# Patient Record
Sex: Female | Born: 1950
Health system: Southern US, Community
[De-identification: ages and names within clinical notes are randomized; demographics above are authoritative.]

## PROBLEM LIST (undated history)

## (undated) DIAGNOSIS — M1990 Unspecified osteoarthritis, unspecified site: Secondary | ICD-10-CM

## (undated) DIAGNOSIS — E538 Deficiency of other specified B group vitamins: Secondary | ICD-10-CM

## (undated) DIAGNOSIS — Z8719 Personal history of other diseases of the digestive system: Secondary | ICD-10-CM

## (undated) DIAGNOSIS — K5090 Crohn's disease, unspecified, without complications: Secondary | ICD-10-CM

## (undated) DIAGNOSIS — M810 Age-related osteoporosis without current pathological fracture: Principal | ICD-10-CM

## (undated) DIAGNOSIS — D509 Iron deficiency anemia, unspecified: Secondary | ICD-10-CM

## (undated) DIAGNOSIS — K2970 Gastritis, unspecified, without bleeding: Secondary | ICD-10-CM

## (undated) DIAGNOSIS — B9681 Helicobacter pylori [H. pylori] as the cause of diseases classified elsewhere: Secondary | ICD-10-CM

## (undated) DIAGNOSIS — K219 Gastro-esophageal reflux disease without esophagitis: Secondary | ICD-10-CM

## (undated) DIAGNOSIS — E669 Obesity, unspecified: Secondary | ICD-10-CM

## (undated) DIAGNOSIS — R51 Headache: Secondary | ICD-10-CM

## (undated) DIAGNOSIS — M722 Plantar fascial fibromatosis: Secondary | ICD-10-CM

## (undated) DIAGNOSIS — M797 Fibromyalgia: Secondary | ICD-10-CM

## (undated) DIAGNOSIS — L509 Urticaria, unspecified: Secondary | ICD-10-CM

## (undated) DIAGNOSIS — R519 Headache, unspecified: Secondary | ICD-10-CM

## (undated) HISTORY — DX: Fibromyalgia: M79.7

## (undated) HISTORY — PX: UPPER GASTROINTESTINAL ENDOSCOPY: SHX188

## (undated) HISTORY — PX: ABDOMINAL HYSTERECTOMY: SHX81

## (undated) HISTORY — DX: Crohn's disease, unspecified, without complications: K50.90

## (undated) HISTORY — DX: Deficiency of other specified B group vitamins: E53.8

## (undated) HISTORY — DX: Iron deficiency anemia, unspecified: D50.9

## (undated) HISTORY — PX: TOTAL ABDOMINAL HYSTERECTOMY W/ BILATERAL SALPINGOOPHORECTOMY: SHX83

## (undated) HISTORY — DX: Obesity, unspecified: E66.9

## (undated) HISTORY — PX: OVARIAN CYST REMOVAL: SHX89

## (undated) HISTORY — DX: Gastritis, unspecified, without bleeding: K29.70

## (undated) HISTORY — DX: Age-related osteoporosis without current pathological fracture: M81.0

## (undated) HISTORY — DX: Headache: R51

## (undated) HISTORY — DX: Helicobacter pylori (H. pylori) as the cause of diseases classified elsewhere: B96.81

## (undated) HISTORY — PX: COLONOSCOPY: SHX174

## (undated) HISTORY — DX: Headache, unspecified: R51.9

## (undated) HISTORY — DX: Plantar fascial fibromatosis: M72.2

## (undated) HISTORY — DX: Urticaria, unspecified: L50.9

---

## 2001-01-19 ENCOUNTER — Other Ambulatory Visit: Admission: RE | Admit: 2001-01-19 | Discharge: 2001-01-19 | Payer: Self-pay | Admitting: *Deleted

## 2001-02-03 ENCOUNTER — Emergency Department (HOSPITAL_COMMUNITY): Admission: EM | Admit: 2001-02-03 | Discharge: 2001-02-03 | Payer: Self-pay | Admitting: *Deleted

## 2002-02-08 ENCOUNTER — Ambulatory Visit (HOSPITAL_COMMUNITY): Admission: RE | Admit: 2002-02-08 | Discharge: 2002-02-08 | Payer: Self-pay | Admitting: Urology

## 2002-02-08 ENCOUNTER — Encounter: Payer: Self-pay | Admitting: Urology

## 2002-02-22 ENCOUNTER — Other Ambulatory Visit: Admission: RE | Admit: 2002-02-22 | Discharge: 2002-02-22 | Payer: Self-pay | Admitting: *Deleted

## 2002-03-12 ENCOUNTER — Inpatient Hospital Stay (HOSPITAL_COMMUNITY): Admission: RE | Admit: 2002-03-12 | Discharge: 2002-03-15 | Payer: Self-pay | Admitting: *Deleted

## 2002-07-14 ENCOUNTER — Encounter: Payer: Self-pay | Admitting: Family Medicine

## 2002-07-14 ENCOUNTER — Ambulatory Visit (HOSPITAL_COMMUNITY): Admission: RE | Admit: 2002-07-14 | Discharge: 2002-07-14 | Payer: Self-pay | Admitting: Family Medicine

## 2003-03-23 ENCOUNTER — Encounter: Payer: Self-pay | Admitting: Unknown Physician Specialty

## 2003-03-23 ENCOUNTER — Ambulatory Visit (HOSPITAL_COMMUNITY): Admission: RE | Admit: 2003-03-23 | Discharge: 2003-03-23 | Payer: Self-pay | Admitting: Unknown Physician Specialty

## 2005-03-27 ENCOUNTER — Ambulatory Visit (HOSPITAL_COMMUNITY): Admission: RE | Admit: 2005-03-27 | Discharge: 2005-03-27 | Payer: Self-pay | Admitting: Family Medicine

## 2005-08-24 ENCOUNTER — Inpatient Hospital Stay (HOSPITAL_COMMUNITY): Admission: EM | Admit: 2005-08-24 | Discharge: 2005-08-24 | Payer: Self-pay | Admitting: Emergency Medicine

## 2006-11-17 ENCOUNTER — Emergency Department (HOSPITAL_COMMUNITY): Admission: EM | Admit: 2006-11-17 | Discharge: 2006-11-17 | Payer: Self-pay | Admitting: Emergency Medicine

## 2007-01-08 ENCOUNTER — Ambulatory Visit (HOSPITAL_COMMUNITY): Admission: RE | Admit: 2007-01-08 | Discharge: 2007-01-08 | Payer: Self-pay | Admitting: Family Medicine

## 2007-07-30 DIAGNOSIS — D509 Iron deficiency anemia, unspecified: Secondary | ICD-10-CM

## 2007-07-30 HISTORY — DX: Iron deficiency anemia, unspecified: D50.9

## 2007-10-01 ENCOUNTER — Ambulatory Visit: Payer: Self-pay | Admitting: Gastroenterology

## 2007-10-14 ENCOUNTER — Encounter: Payer: Self-pay | Admitting: Gastroenterology

## 2007-10-14 ENCOUNTER — Ambulatory Visit: Payer: Self-pay | Admitting: Gastroenterology

## 2007-10-14 ENCOUNTER — Ambulatory Visit (HOSPITAL_COMMUNITY): Admission: RE | Admit: 2007-10-14 | Discharge: 2007-10-14 | Payer: Self-pay | Admitting: Gastroenterology

## 2007-10-14 HISTORY — PX: ESOPHAGOGASTRODUODENOSCOPY: SHX1529

## 2007-10-14 HISTORY — PX: OTHER SURGICAL HISTORY: SHX169

## 2009-10-11 ENCOUNTER — Encounter (HOSPITAL_COMMUNITY): Admission: RE | Admit: 2009-10-11 | Discharge: 2009-11-10 | Payer: Self-pay | Admitting: Oncology

## 2009-10-11 ENCOUNTER — Ambulatory Visit (HOSPITAL_COMMUNITY): Payer: Self-pay | Admitting: Oncology

## 2009-11-28 ENCOUNTER — Encounter (HOSPITAL_COMMUNITY): Admission: RE | Admit: 2009-11-28 | Discharge: 2009-12-28 | Payer: Self-pay | Admitting: Oncology

## 2009-11-28 ENCOUNTER — Ambulatory Visit (HOSPITAL_COMMUNITY): Payer: Self-pay | Admitting: Oncology

## 2010-01-23 ENCOUNTER — Ambulatory Visit (HOSPITAL_COMMUNITY): Payer: Self-pay | Admitting: Oncology

## 2010-01-23 ENCOUNTER — Encounter (HOSPITAL_COMMUNITY): Admission: RE | Admit: 2010-01-23 | Discharge: 2010-02-22 | Payer: Self-pay | Admitting: Oncology

## 2010-01-24 ENCOUNTER — Telehealth (INDEPENDENT_AMBULATORY_CARE_PROVIDER_SITE_OTHER): Payer: Self-pay

## 2010-02-07 ENCOUNTER — Encounter (INDEPENDENT_AMBULATORY_CARE_PROVIDER_SITE_OTHER): Payer: Self-pay

## 2010-02-16 ENCOUNTER — Encounter: Payer: Self-pay | Admitting: Gastroenterology

## 2010-02-21 ENCOUNTER — Ambulatory Visit: Payer: Self-pay | Admitting: Gastroenterology

## 2010-02-21 ENCOUNTER — Ambulatory Visit (HOSPITAL_COMMUNITY)
Admission: RE | Admit: 2010-02-21 | Discharge: 2010-02-21 | Payer: Self-pay | Source: Home / Self Care | Admitting: Gastroenterology

## 2010-02-23 ENCOUNTER — Telehealth: Payer: Self-pay | Admitting: Gastroenterology

## 2010-03-06 ENCOUNTER — Encounter (HOSPITAL_COMMUNITY): Admission: RE | Admit: 2010-03-06 | Discharge: 2010-04-05 | Payer: Self-pay | Admitting: Oncology

## 2010-04-10 ENCOUNTER — Ambulatory Visit: Payer: Self-pay | Admitting: Gastroenterology

## 2010-04-10 DIAGNOSIS — A048 Other specified bacterial intestinal infections: Secondary | ICD-10-CM | POA: Insufficient documentation

## 2010-04-10 DIAGNOSIS — D509 Iron deficiency anemia, unspecified: Secondary | ICD-10-CM | POA: Insufficient documentation

## 2010-05-01 ENCOUNTER — Encounter (INDEPENDENT_AMBULATORY_CARE_PROVIDER_SITE_OTHER): Payer: Self-pay | Admitting: *Deleted

## 2010-05-08 ENCOUNTER — Ambulatory Visit (HOSPITAL_COMMUNITY): Payer: Self-pay | Admitting: Oncology

## 2010-05-08 ENCOUNTER — Encounter (HOSPITAL_COMMUNITY)
Admission: RE | Admit: 2010-05-08 | Discharge: 2010-06-07 | Payer: Self-pay | Source: Home / Self Care | Admitting: Oncology

## 2010-07-03 ENCOUNTER — Encounter (HOSPITAL_COMMUNITY)
Admission: RE | Admit: 2010-07-03 | Discharge: 2010-08-02 | Payer: Self-pay | Source: Home / Self Care | Attending: Oncology | Admitting: Oncology

## 2010-07-03 ENCOUNTER — Ambulatory Visit (HOSPITAL_COMMUNITY): Payer: Self-pay | Admitting: Oncology

## 2010-08-19 ENCOUNTER — Encounter: Payer: Self-pay | Admitting: Family Medicine

## 2010-08-20 ENCOUNTER — Encounter: Payer: Self-pay | Admitting: Gastroenterology

## 2010-08-28 ENCOUNTER — Encounter (HOSPITAL_COMMUNITY)
Admission: RE | Admit: 2010-08-28 | Discharge: 2010-08-28 | Payer: Self-pay | Source: Home / Self Care | Attending: Oncology | Admitting: Oncology

## 2010-08-28 ENCOUNTER — Ambulatory Visit (HOSPITAL_COMMUNITY)
Admission: RE | Admit: 2010-08-28 | Discharge: 2010-08-28 | Payer: Self-pay | Source: Home / Self Care | Attending: Oncology | Admitting: Oncology

## 2010-08-28 LAB — CBC
HCT: 35.2 % — ABNORMAL LOW (ref 36.0–46.0)
MCH: 28.4 pg (ref 26.0–34.0)
MCV: 82.6 fL (ref 78.0–100.0)
Platelets: 252 10*3/uL (ref 150–400)
RBC: 4.26 MIL/uL (ref 3.87–5.11)
WBC: 10 10*3/uL (ref 4.0–10.5)

## 2010-08-28 LAB — FERRITIN: Ferritin: 32 ng/mL (ref 10–291)

## 2010-08-28 NOTE — Letter (Signed)
Summary: Plan of Care, Need to [facility]  [address]   [place], [place] [address]   Phone: [phone]  Fax: [phone]    February 07, 2010  [name] [address] [place], [place]  [address] [birth year]   Dear [name],   We are writing this letter to inform you of treatment plans and/or discuss your plan of care.  We have left messages on the machine several times. Please call so we can get you scheduled for the EGD that we had discussed.  Please do not neglect your health.   Sincerely,    Waldon Merl LPN  [facility] Ph: [phone]    Fax: [phone]

## 2010-08-28 NOTE — Letter (Signed)
Summary: Plan of Care, Need to [facility]  [address]   [place], [place] [address]   Phone: [phone]  Fax: [phone]    February 07, 2010  [name] [address] [place], [place]  [address] April 08, 1951   Dear [name],   We are writing this letter to inform you of treatment plans and/or discuss your plan of care.  We have tried several times to contact you; however, we have yet to reach you.  We ask that you please contact our office for follow-up on your gastrointestinal issues.  We can  be reached at [phone] to schedule an appointment, or to speak with someone regarding your health care needs.  Please do not neglect your health.   Sincerely,    Waldon Merl LPN  [facility] Ph: [phone]    Fax: [phone]

## 2010-08-28 NOTE — Assessment & Plan Note (Signed)
Summary: [name] GASTRITIS   Visit Type:  Follow-up Visit Primary Care Dorothee Napierkowski:  [name], M.D.  Chief Complaint:  F/U H. Pylori gastritis/SB ulcer.  History of Present Illness: Rare pain in belly.  Had a little with ABO and took all the pills. BMs: nl, 2-3 times a day. No blood in stool or melena. Appetite: good. Back pain from DDD. No joint pain. No ASA, BC, Goody's, Ibuprofen, or Aleve. Quit NSAIDS several years ago.  Preventive Screening-Counseling & Management  Alcohol-Tobacco     Smoking Status: never  Current Medications (verified): 1)  Prilosec 20 Mg Cpdr (Omeprazole) .... Take 1 Tablet By Mouth Once A Day 2)  Pentasa 500 Mg Cr-Caps (Mesalamine) .... 2 By Mouth Qid  Allergies (verified): 1)  ! Sulfa  Past History:  Past Medical History: Iron deficiency anemia 2009 on IBUPROFEN-ulcers in the terminal ileum/Cameron's ulcers **iTCS/EGD-2009-LARGE  HIATAL HERNIA H. pylori gastritis/EGD JULY 2011 **AFN x7 DAYS 2009, and ABO BID x10 DAYS JULY 2011 JULY 2011-CAPSULE ENDOSCOPY: SB ULCERS, Pentasa 2 qid GERD  **EGD/Bx '09, '11 Fibromyalgia Arthritis DDD  Past Surgical History: Hysterectomy  Family History: No FH of Colon Cancer OR POLYPS  Social History: NO cigs Alcohol Use - no Married x11 years Smoking Status:  never  Review of Systems       2009: 204 lbs  Vital Signs:  Patient profile:   60 year old female Height:      64 inches Weight:      203 pounds BMI:     34.97 Temp:     98.1 degrees F oral Pulse rate:   84 / minute BP sitting:   120 / 76  (left arm) Cuff size:   large  Vitals Entered By: Waldon Merl LPN (April 10, 2632 2:41 PM)  Physical Exam  General:  Well developed, well nourished, no acute distress. Head:  Normocephalic and atraumatic. Eyes:  PERRL, no icterus. Mouth:  No deformity or lesions. Neck:  Supple; no masses. Lungs:  Clear throughout to auscultation. Heart:  Regular rate and rhythm; no  murmurs. Abdomen:  Soft, nontender and nondistended. Normal bowel sounds. obese.   Extremities:  No edema noted. Neurologic:  Alert and  oriented x4;  grossly normal neurologically.  Impression & Recommendations:  Problem # 1:  ANEMIA, IRON DEFICIENCY (ICD-280.9) Pt likely has SB Crohn's Disease, website-www.ccfa.org. Obtain PROMETHEUS IBD PANEL. Pt will call insurance to see if you have a large co-pay for this lab. Continue Pentasa. Have labs faxed to me in OCT 2011. If blood or iron low, then we will need to repeat the colonoscopy. Otherwise, follow up in 6 mos.   CC: PCP AND DR. Tressie Stalker  Problem # 2:  HELICOBACTER PYLORI GASTRITIS (ICD-041.86) Assessment: Improved Took ABO. If FeDA perists,consider H. pylori stool Ag.  Patient Instructions: 1)  You likely have Crohn's Disease, website-www.ccfa.org. 2)  GET PROMETHEUS IBD PANEL DRAWN. 3)  Call insurance to see if you have a large co-pay for this lab. 4)  Continue Pentasa. 5)  Have labs faxed to me in OCT 2011. 6)  If blood or iron low, then we will need to repeat the colonoscopy. 7)  Otherwise, follow up in 6 mos.  8)  The medication list was reviewed and reconciled.  All changed / newly prescribed medications were explained.  A complete medication list was provided to the patient / caregiver.  Appended Document: FEDA, H PYLORI GASTRITIS 6 MONTH F/U OPV IS IN THE COMPUTER  Appended Document:  Orders Update    Clinical Lists Changes  Orders: Added new Service order of Est. Patient Level IV (59539) - Signed

## 2010-08-28 NOTE — Letter (Signed)
Summary: Internal Other /EGD Order  Internal Other /EGD Order   Imported By: Cloria Spring LPN 69/62/9528 41:32:44  _____________________________________________________________________  External Attachment:    Type:   Image     Comment:   External Document

## 2010-08-28 NOTE — Progress Notes (Signed)
Summary: H. pylori gastritis and TI ULCERS-START ABO AND PENTASA       New/Updated Medications: AMOXICILLIN 500 MG CAPS (AMOXICILLIN) 1 gm two times a day for 10 days BIAXIN 500 MG TABS (CLARITHROMYCIN) 1 by mouth two times a day for 10 days PRILOSEC 20 MG CPDR (OMEPRAZOLE) 1 by mouth two times a day for 10 days then 1 by mouth every morning PENTASA 500 MG CR-CAPS (MESALAMINE) 2 by mouth qid Prescriptions: PENTASA 500 MG CR-CAPS (MESALAMINE) 2 by mouth qid  #240 x 5   Entered and Authorized by:   West Bali MD   Signed by:   West Bali MD on 02/23/2010   Method used:   Electronically to        [facility] [phone]* (retail)       [address]       [place], [place]  [address]       Ph: [phone] or [phone]       Fax: [phone]   RxID:   [id number] PRILOSEC 20 MG CPDR (OMEPRAZOLE) 1 by mouth two times a day for 10 days then 1 by mouth every morning  #40 x 5   Entered and Authorized by:   West Bali MD   Signed by:   West Bali MD on 02/23/2010   Method used:   Electronically to        [facility] [phone]* (retail)       [address]       [place], [place]  [address]       Ph: [phone] or [phone]       Fax: [phone]   RxID:   [id number] BIAXIN 500 MG TABS (CLARITHROMYCIN) 1 by mouth two times a day for 10 days  #20 x 0   Entered and Authorized by:   West Bali MD   Signed by:   West Bali MD on 02/23/2010   Method used:   Electronically to        [facility] [phone]* (retail)       [address]       [place], [place]  [address]       Ph: [phone] or [phone]       Fax: [phone]   RxID:   [id number] AMOXICILLIN 500 MG CAPS (AMOXICILLIN) 1 gm two times a day for 10 days  #40 x 0   Entered and Authorized by:   West Bali MD   Signed by:   West Bali MD on 02/23/2010   Method used:   Electronically to        [facility] [phone]* (retail)       [address]       [place], [place]  [address]       Ph: [phone] or [phone]       Fax: [phone]   RxID:   [id number]  Spoke with pt. Discussed Bx and capsule results. Pt needs treatment for H. pylori gastritis.Needs ABO BIDx10 days and continue OMP for 3 mos. Reviewed meds: Zanatc and Hydroxyzine. Pt asked to Hold ranitidine while on OMP and Biaxin. Capsule shows multiple ulcers in the TI. Management options: 1. ileoTCS now and biopsy or 2. Mesalamine now and if no improvement  in Ferritin/Hb in 3 mos, ileoTCS/Bx. Pt elected to have option #2. Medication side effects discussed. OPV in 2 mos. West Bali MD  February 23, 2010 10:28 AM   Spoke with pt. Would like to pick up written instructions: 1. Take Antibiotics and Omeprazole twice a day for 10 days. 2. DO NOT TAKE RANITINDE WITH THE ANTIBIOTICS AND OMEPRAZOLE. 3. Medication side effects include nausea, vomiting, diarrhea, abd crampas, and a metallic taste. 4. Take Pentasa four times a day. It may cause a red rash. 5. Follow up with Dr. Darrick Penna in 2 mos. West Bali MD  February 23, 2010 10:33 AM   Appended Document: H. pylori gastritis and TI ULCERS-START ABO AND PENTASA Left message with Dr. Mariel Sleet RE: Bx and CE results.  Appended Document: H. pylori gastritis and TI ULCERS-START ABO AND PENTASA printed and left at front desk for pt  Appended Document: H. pylori gastritis and TI ULCERS-START ABO AND PENTASA pt aware of appt in Sept

## 2010-08-28 NOTE — Progress Notes (Signed)
Summary: phone note from Dr. Thornton Papas office  Phone Note From Other Clinic   Summary of Call: T/C from [facility] @ Dr. Thornton Papas office in reference to pt. They are giving her the 2nd round of iron in 3 months.  Dr. Mariel Sleet would like for pt to follow-up with Dr. Darrick Penna, in reference to scheduling a Capsule study. Please advise if she needs OV or schedule the capsule study.  Initial call taken by: Cloria Spring LPN,  January 24, 2010 3:15 PM     Appended Document: phone note from Dr. Thornton Papas office Pt needs EGD prior to a capsule study being performed. She had Cameron's ulcer on last EGD. If no Cameron's ulcer on EGD then pt will need capsule endoscopy. Please let them know and schedule pt for EHD after triaging her.  Appended Document: phone note from Dr. Thornton Papas office Called pt to triage for EGD, she said she had not been told anything by Dr. Mariel Sleet or his office. She was kind of stunned, said it is OK, but she is getting ready for work and will have to call back. I called and left message for [facility] @ Dr. Thornton Papas for a return call.  Appended Document: phone note from Dr. Thornton Papas office Spoke with [facility] @ Dr. Arnell Asal, just informed i was in process of scheduling pt for EGD per Dr. Darrick Penna.  Appended Document: phone note from Dr. Thornton Papas office [facility] to call.  Appended Document: phone note from Dr. Thornton Papas office [facility] to call.  Appended Document: phone note from Dr. Thornton Papas office [facility] to call.   Appended Document: phone note from Dr. Thornton Papas office Mailed letter to call.

## 2010-08-28 NOTE — Letter (Signed)
Summary: Recall Office Visit  [facility]  [address]   [place], [place] [address]   Phone: [phone]  Fax: [phone]      May 01, 2010   [name] [address] [place], [place]  [address] 09/15/1950   Dear Ms. Geurts,   According to our records, it is time for you to schedule a follow-up office visit with [place].   At your convenience, please call [phone] to schedule an office visit. If you have any questions, concerns, or feel that this letter is in error, we would appreciate your call.   Sincerely,    Diana Eves  [facility] Ph: [phone]   Fax: [phone]  Appended Document: Recall Office Visit AUG 2011: HB 12.3 FERRITIN 148  OCT 2011: HB 12.9

## 2010-08-30 NOTE — Letter (Signed)
Summary: REQUEST FOR MEDICAL RECORDS  REQUEST FOR MEDICAL RECORDS   Imported By: Rexene Alberts 08/20/2010 14:55:59  _____________________________________________________________________  External Attachment:    Type:   Image     Comment:   External Document

## 2010-09-04 ENCOUNTER — Ambulatory Visit (HOSPITAL_COMMUNITY): Payer: Self-pay | Admitting: Oncology

## 2010-09-12 ENCOUNTER — Ambulatory Visit (HOSPITAL_COMMUNITY): Payer: BC Managed Care – PPO | Admitting: Oncology

## 2010-09-12 DIAGNOSIS — D509 Iron deficiency anemia, unspecified: Secondary | ICD-10-CM

## 2010-09-14 ENCOUNTER — Encounter (INDEPENDENT_AMBULATORY_CARE_PROVIDER_SITE_OTHER): Payer: Self-pay | Admitting: *Deleted

## 2010-09-18 ENCOUNTER — Ambulatory Visit (HOSPITAL_COMMUNITY): Payer: BC Managed Care – PPO

## 2010-09-18 DIAGNOSIS — D509 Iron deficiency anemia, unspecified: Secondary | ICD-10-CM

## 2010-09-19 NOTE — Letter (Signed)
Summary: Recall Office Visit  [facility]  [address]   [place], [place] [address]   Phone: [phone]  Fax: [phone]      September 14, 2010   [name] [address] [place], [place]  [address] 26-Oct-1950   Dear Ms. Fauth,   According to our records, it is time for you to schedule a follow-up office visit with [place].   At your convenience, please call [phone] to schedule an office visit. If you have any questions, concerns, or feel that this letter is in error, we would appreciate your call.   Sincerely,    Diana Eves  [facility] Ph: [phone]   Fax: [phone]

## 2010-10-08 LAB — FERRITIN: Ferritin: 76 ng/mL (ref 10–291)

## 2010-10-08 LAB — CBC
HCT: 37 % (ref 36.0–46.0)
MCV: 83.5 fL (ref 78.0–100.0)
RDW: 13.3 % (ref 11.5–15.5)

## 2010-10-10 LAB — CBC
HCT: 37.4 % (ref 36.0–46.0)
Hemoglobin: 12.9 g/dL (ref 12.0–15.0)
MCHC: 34.4 g/dL (ref 30.0–36.0)
MCV: 84.1 fL (ref 78.0–100.0)
RDW: 14.6 % (ref 11.5–15.5)
WBC: 8.5 10*3/uL (ref 4.0–10.5)

## 2010-10-10 LAB — DIFFERENTIAL
Basophils Relative: 1 % (ref 0–1)
Eosinophils Absolute: 0.1 10*3/uL (ref 0.0–0.7)
Lymphs Abs: 2.3 10*3/uL (ref 0.7–4.0)
Monocytes Absolute: 0.6 10*3/uL (ref 0.1–1.0)
Monocytes Relative: 7 % (ref 3–12)

## 2010-10-12 LAB — FERRITIN: Ferritin: 148 ng/mL (ref 10–291)

## 2010-10-12 LAB — CBC
Hemoglobin: 12.3 g/dL (ref 12.0–15.0)
MCHC: 34 g/dL (ref 30.0–36.0)

## 2010-10-14 LAB — CBC
HCT: 33.2 % — ABNORMAL LOW (ref 36.0–46.0)
Hemoglobin: 11 g/dL — ABNORMAL LOW (ref 12.0–15.0)
MCHC: 33.1 g/dL (ref 30.0–36.0)
MCV: 77.1 fL — ABNORMAL LOW (ref 78.0–100.0)
RBC: 4.3 MIL/uL (ref 3.87–5.11)
RDW: 15.9 % — ABNORMAL HIGH (ref 11.5–15.5)

## 2010-10-14 LAB — FERRITIN: Ferritin: 14 ng/mL (ref 10–291)

## 2010-10-16 LAB — CBC
MCHC: 34.2 g/dL (ref 30.0–36.0)
MCV: 73.9 fL — ABNORMAL LOW (ref 78.0–100.0)
Platelets: 254 10*3/uL (ref 150–400)

## 2010-10-16 LAB — FERRITIN: Ferritin: 25 ng/mL (ref 10–291)

## 2010-10-19 LAB — CBC
HCT: 23 % — ABNORMAL LOW (ref 36.0–46.0)
Hemoglobin: 7.3 g/dL — ABNORMAL LOW (ref 12.0–15.0)
MCV: 62 fL — ABNORMAL LOW (ref 78.0–100.0)
RBC: 3.71 MIL/uL — ABNORMAL LOW (ref 3.87–5.11)
RDW: 17.5 % — ABNORMAL HIGH (ref 11.5–15.5)
WBC: 7.6 10*3/uL (ref 4.0–10.5)

## 2010-11-13 ENCOUNTER — Encounter (HOSPITAL_COMMUNITY): Payer: BC Managed Care – PPO | Attending: Oncology

## 2010-11-13 DIAGNOSIS — D509 Iron deficiency anemia, unspecified: Secondary | ICD-10-CM

## 2010-12-11 NOTE — Consult Note (Signed)
[name], [name]                  ACCOUNT NO.:  [id number]   MEDICAL RECORD NO.:  [id number]          PATIENT TYPE:  AMB   LOCATION:  DAY                           FACILITY:  [facility]   PHYSICIAN:  Caro Hight, M.D.      DATE OF BIRTH:  August 17, 1950   DATE OF CONSULTATION:  10/01/2007  DATE OF DISCHARGE:                                 CONSULTATION   REFERRING PHYSICIAN:  Margaretmary Eddy, M.D.   REASON FOR CONSULTATION:  Iron-deficiency anemia.   HISTORY OF PRESENT ILLNESS:  [name] is a 60 year old female.  She has  a history of chronic iron-deficiency anemia.  She has been told several  times since 2004 that she is anemic.  She underwent a hysterectomy in  2004, given severe anemia.  She was on iron at that time.  She tells me  she has episodes where she has pica and craves ice.  She has felt very  fatigued.  She then presented to Dr. Lance Sell office and was found to  have a hemoglobin of 9.5.  She had a ferritin checked, and it was 2.  She had a normal folate, a normal B12, and a normal TSH.  She has been  on oral iron for about to weeks now.  She has noted some scant  hematochezia which was a light pink-red blood that she attributes to  hemorrhoids with wiping.  She denies any abdominal pain.  Denies any  nausea or vomiting.  She rarely has heartburn or indigestion.  She  denies any diarrhea or constipation.  Denies any dysphagia, odynophagia,  anorexia, or early satiety.  Her weight has remained stable.  She has  never had a colonoscopy.  She does take a rare Advil.   PAST MEDICAL AND SURGICAL HISTORY:  1. Arthritis.  2. Fibromyalgia.  3. Degenerative disc disease.  4. Complete hysterectomy.   CURRENT MEDICATIONS:  1. Muscle relaxant, does not know the name, p.r.n.  2. Iron 65 mg daily.  3. Advil p.r.n.   ALLERGIES:  NO KNOWN DRUG ALLERGIES.   FAMILY HISTORY:  There is no known family history of colorectal  carcinoma or liver or chronic GI problems.   SOCIAL HISTORY:   [name] is married.  She has three grown healthy  children.  She is employed with [employer].  She has a remote history  of tobacco use.  Denies any alcohol or drug use.   REVIEW OF SYSTEMS:  See HPI, otherwise negative.   PHYSICAL EXAMINATION:  VITAL SIGNS:  Weight 202 pounds, height 63  inches, temperature 97.8, blood pressure 100/70, pulse 80.  GENERAL:  [name] is an obese Caucasian female who is alert, oriented,  pleasant, and cooperative, no acute distress.  HEENT:  Sclerae clear.  Nonicteric.  Conjunctivae pink.  Oropharynx pink and moist, without any  lesions.  NECK:  Supple, without any masses or thyromegaly.  CHEST:  Heart:  Regular rate and rhythm.  Normal S1, S2, without any  murmurs, clicks, rubs, or gallops.  LUNGS:  Clear to auscultation bilaterally.  ABDOMEN:  Protuberant  __________.  SKIN:  Pink, warm, and dry, without any rash or jaundice.   LABORATORY STUDIES:  See HPI.  She had a BMET on September 16, 2007 which  was normal.   IMPRESSION:  [name] is a 60 year old female with history of chronic  iron-deficiency anemia, ferritin of 2.  The last hemoglobin through Dr.  Lance Sell office was noted to be 9.5.  She has had scant hematochezia  with wiping on the toilet tissue.  Otherwise denies any GI complaints  other than rare heartburn and indigestion.  She has had significant  fatigue which led her to her primary care physician.  She is going to  need further evaluation to determine etiology of her chronic iron  deficiency anemia, including ruling out colorectal carcinoma or occult  malignancy.  Actually, we need to rule out Helicobacter pylori gastritis  as well as small-bowel AVMs if nothing is found on upper and lower  exams.   PLAN:  1. Colonoscopy +/- EGD with Dr. Stann Mainland in the near future.  I have      discussed both procedures, including risks and benefits, which      include but are not limited to bleeding, infection, perforation, or      drug  reaction.  She agrees with the plan Consent will be obtained.  2. She is told iron for 7 days prior to the procedure.  3. Further recommendations pending procedures.   Thank you, Dr. Wolfgang Phoenix, for allowing [place] to participate in the care of Ms.  Hart.      Vickey Huger, N.P.      Caro Hight, M.D.  Electronically Signed    KJ/MEDQ  D:  10/01/2007  T:  10/02/2007  Job:  [id number]   cc:   Margaretmary Eddy, M.D.  Fax: [phone]

## 2010-12-11 NOTE — Op Note (Signed)
[name], [name]                  ACCOUNT NO.:  [id number]   MEDICAL RECORD NO.:  [id number]          PATIENT TYPE:  AMB   LOCATION:  DAY                           FACILITY:  [facility]   PHYSICIAN:  Caro Hight, M.D.      DATE OF BIRTH:  06-Mar-1951   DATE OF PROCEDURE:  10/14/2007  DATE OF DISCHARGE:                               OPERATIVE REPORT   PROCEDURE:  1. Ileocolonoscopy with cold forceps biopsy of terminal ileum ulcers.  2. Esophagogastroduodenoscopy with cold forceps biopsy of Cameron's      ulcers.   INDICATION FOR EXAM:  [name] is a 60 year old female who presented  with iron deficiency anemia as an outpatient.  She denied any overt  signs of active bleeding except occasional blood when she wiped from her  hemorrhoids.   FINDINGS:  1. Multiple 1-3 mm terminal ileum ulcers biopsied via cold forceps.      The ulcers seemed to only involve the last 5 cm of her terminal      ileum, and 10 cm of her ileum was visualized.  Otherwise the colon      was without polyps, masses, diverticula, or AVMs.  She had a normal      retroflexed view of the rectum.  2. Esophagus showed 2 cm pink tongue seen extending from the GE      junction.  Biopsies obtained via cold forceps.  Otherwise the      esophagus was without erosions, mass, ulceration, or stricture.  3. Large hiatal hernia pouch with linear erosions, and two 3-6 mm      ulcers seen in the pouch.  Biopsies obtained via cold forceps to      evaluate for H. pylori gastritis or malignancy.   DIAGNOSES:  Terminal ileum ulcers and Cameron's ulcer likely the source  of her iron deficiency anemia.   RECOMMENDATIONS:  1. She has a follow up appointment to see me in 2 weeks for her      anemia.  2. We will call [name] with the results of her biopsies.  She is      instructed not to take ibuprofen for the next 30 days.  She should      continue her iron therapy.  3. She is given a handout on ulcers and a hiatal hernia.  4. If  she does require chronic anti-inflammatory therapy, then      recommend that she use an anti-inflammatory drug that is in a      different NSAID class than an ibuprofen.  Once her biopsy results      are known, then we will decide if she needs surgical repair of her      hiatal hernia.   PROCEDURE TECHNIQUE:  Physical exam was performed and informed consent  was obtained from the patient after explaining the benefits, risks and  alternatives to the procedure.  The patient was connected to the monitor  and placed in the left lateral position.  Continuous oxygen was provided  by nasal cannula and IV medicine administered through an indwelling  cannula.  After administration of sedation and rectal exam, the  patient's rectum intubated and the scope was advanced under direct  visualization to the distal terminal ileum.  The scope was removed  slowly by carefully examining the color, texture, anatomy, and integrity  of the mucosa on the way out.   After the colonoscopy, the patient's esophagus was intubated and the  scope was advanced under direct visualization to the second portion of  the duodenum.  Traversing the large hiatal hernia was slightly  challenging.  The scope was removed slowly by carefully examining the  color, texture, anatomy, and integrity of the mucosa on the way out.  The patient was recovered in endoscopy and discharged home in  satisfactory condition.   PATH:  Positive for H. pylori gastritis. Needs NEXIUM/AMOX/FLAG BID for  7 days, then Nexium daily until OPV in 3 mos.      Caro Hight, M.D.  Electronically Signed     SM/MEDQ  D:  10/15/2007  T:  10/15/2007  Job:  [id number]   cc:   Margaretmary Eddy, M.D.  Fax: [phone]

## 2010-12-14 NOTE — H&P (Signed)
[name], [name]                  ACCOUNT NO.:  [id number]   MEDICAL RECORD NO.:  [id number]          PATIENT TYPE:  EMS   LOCATION:  MAJO                         FACILITY:  [facility]   PHYSICIAN:  Ashby Dawes. Polite, M.D. DATE OF BIRTH:  [birth year]   DATE OF ADMISSION:  08/23/2005  DATE OF DISCHARGE:                                HISTORY & PHYSICAL   CHIEF COMPLAINT:  Neural deficit characterized by blurred vision and left  arm numbness.   HISTORY OF PRESENT ILLNESS:  60 year old female with known history of  migraine headaches in the past two years, fibromyalgia, borderline diabetes,  obesity, who presents to the ED with the above chief complaint.  According  to the patient, she was in her usual state of health until today when, while  driving, had symptoms of blurred vision as if a film was coming over her  eyes, to the point that she was unable to drive.  The patient stated she had  to call someone to come and pick her up.  The patient stated she was given  something for a migraine headache, however, she is not sure if that helped,  at all.  As a matter of fact, the patient stated that normally if she has  trouble with migraines, she has trouble with double vision and not just  frank blurred vision.  Also of note, the patient states she has not had a  migraine headache in two years.  Because of the above symptoms, the patient  presented to her primary MD approximately 1 1/2 to 2 hours after the above  event.  At that time, the patient stated her vision had improved but she  also noted some numbness on her left upper extremity.  The patient was  recommended to come to the ED for further evaluation and treatment.  In the  ED, the patient was evaluated, had an EKG with normal sinus rhythm.  CT of  the head negative for acute abnormality.  Vital signs were stable and the  [facility] were called for further evaluation.  At the time of my  evaluation, the patient was alert and  oriented x 3, essentially desiring to  go home thinking that she would be alright.  The patient is still  complaining of some headache as well as numbness on her left upper  extremity.  Therefore, the patient is being admitted for further evaluation  of neurologic deficit characterized by blurred vision for 1 1/2 to 2 hours  which has been resolved, left arm numbness which persists, and posterior  headache.  Of note, the patient denies any new medications.  No fevers,  chills, nausea and vomiting, no diarrhea, no constipation, no bloody stool,  no bloody urine.  The patient states she is in her normal state of health.   PAST MEDICAL HISTORY:  As stated above.   MEDICATIONS ON ADMISSION:  Estrogen patch, Advair which she takes  essentially daily.   SOCIAL HISTORY:  Negative for tobacco, alcohol, or drugs.   ALLERGIES:  None.   PAST SURGICAL HISTORY:  Significant for  hysterectomy in the past secondary  to endometriosis.   FAMILY HISTORY:  Mother with diabetes.  Father with a history of bone  cancer, unknown primary, also had cardiac problems.  The patient has a  brother with coronary artery disease, MI x 2, as well as diabetes, and a  sister with heart problems and diabetes.   REVIEW OF SYMPTOMS:  As stated per HPI.   PHYSICAL EXAMINATION:  GENERAL:  The patient is alert and oriented x 3.  VITAL SIGNS:  Temperature 97.9, blood pressure 117/57, pulse 68, respiratory  rate 20, saturation 94%.  HEENT:  Pupils equal, round, reactive to light, anicteric sclerae.  No oral  lesions.  NECK:  No JVD, no nodes.  LUNGS:  Clear to auscultation bilaterally.  HEART:  Regular rate and rhythm, no S3.  ABDOMEN:  Soft, nontender.  EXTREMITIES:  No edema.  NEUROLOGICAL:  Cranial nerves 2-12 intact, 5/5 motor, deep tendon reflexes  symmetrical, tandem gait intact, negative Romberg.  The patient does have  sensory deficit numbness in the left upper extremity.   LABORATORY DATA:  CT of the head  negative for acute abnormality, chest x-ray  within normal limits.  BMP with sodium 140, potassium 3.5, chloride 109, BUN  11, glucose 97, creatinine 0.5.  CBC with white count 9.3, hemoglobin 13.4,  platelets 257.  Point of care enzymes within normal limits.   ASSESSMENT:  1.  Neural deficit characterized by blurred vision for 1-2 hours which has      been resolved, left arm numbness and occipital headache.  2.  Fibromyalgia.  3.  Borderline diabetes.   RECOMMENDATIONS:  The patient be admitted to telemetry.  Will obtain MRI and  MRA of the brain, serial cardiac enzymes, EKG.  The patient will be  monitored.  We will obtain lipids and start her on aspirin.  Will make  further recommendations after review of the above studies.  May consider  carotid Dopplers, as well.      Ashby Dawes. Polite, M.D.  Electronically Signed     RDP/MEDQ  D:  08/23/2005  T:  08/23/2005  Job:  [id number]

## 2010-12-14 NOTE — Discharge Summary (Signed)
NAMENYJA, WESTBROOK                  ACCOUNT NO.:  [id number]   MEDICAL RECORD NO.:  [id number]          PATIENT TYPE:  INP   LOCATION:  6948                         FACILITY:  [facility]   PHYSICIAN:  Jerelene Redden, MD      DATE OF BIRTH:  11-21-1950   DATE OF ADMISSION:  08/23/2005  DATE OF DISCHARGE:  08/24/2005                                 DISCHARGE SUMMARY   [name] is a 60 year old lady with a known history of migraine headaches,  fibromyalgia, borderline diabetes and obesity who presented to the emergency  room after experiencing an episode when while driving she had sudden onset  of blurred vision as if a film come over her eyes. Ultimately, she had to  have someone come and pick her up. She recognized the symptom as possibly a  warning signs of migraine headaches as she had similar episodes in the past.  She had presented to her primary M.D., and at the time that she was  evaluated in the office, she also had some complaints of left upper  extremity numbness and tingling. She was referred to the [facility]  Emergency Room. In the emergency room, a CT scan of the brain was obtained  which was normal. Laboratory studies obtained included normal cardiac  enzymes. The hemoglobin was 14.3. Sodium 140, potassium 3.5, glucose was 97.  White count was 9300. Blood pressure was 116/67. The patient was seen in the  emergency room by Dr. Seward Carol.   On physical exam, her temperature is 97.9, pulse 68, respirations 20, O2  saturation 94%. HEENT exam was within normal limits. The chest was clear.  Cardiovascular revealed normal S1-S2 without rubs, murmurs or gallops. The  abdomen was benign. On neurologic testing cranial nerves, motor sensory and  cerebellar testing was normal. Station and gait was negative. The patient  was admitted to the hospital for observation. Dr. Delfina Redwood placed the patient  on aspirin 81 milligrams daily. An MRI and MRA scan of the brain was  performed  the next morning August 24, 2005. These studies were absolutely  normal according to the verbal report of the radiologist who read the study.  I talked to [name] at that time and she indicated that the visual  symptoms had resolved and she was feeling well. She indicated a desire to be  discharged. Therefore on August 24, 2005, the patient was discharged.   DISCHARGE DIAGNOSES:  1.  Transient visual blurring probable migraine equivalent.  2.  Fibromyalgia.  3.  History of borderline diabetes.   The patient was advised that if she had recurrent episodes suggestive of  migraine headaches that there would be medication available to her but I did  not initiate any medications at this time as she had only had one episode.  She was advised to continue her usual medications and to follow up with  [facility]. As I understand at the only medicine that she  uses on a regular basis as an estrogen patch.           ______________________________  Jerelene Redden, MD     SY/MEDQ  D:  08/24/2005  T:  08/24/2005  Job:  [id number]   cc:   [facility] in [place], [place]

## 2011-01-01 ENCOUNTER — Other Ambulatory Visit (HOSPITAL_COMMUNITY): Payer: BC Managed Care – PPO

## 2011-01-07 ENCOUNTER — Encounter (HOSPITAL_COMMUNITY): Payer: Self-pay

## 2011-01-07 ENCOUNTER — Telehealth: Payer: Self-pay

## 2011-01-07 ENCOUNTER — Ambulatory Visit (HOSPITAL_COMMUNITY): Payer: BC Managed Care – PPO

## 2011-01-07 MED ORDER — MESALAMINE ER 500 MG PO CPCR: 1000.0000 mg | ORAL_CAPSULE | Freq: Four times a day (QID) | ORAL | Status: DC

## 2011-01-07 NOTE — Telephone Encounter (Signed)
LMOM Rx was sent in, keep appt.

## 2011-01-07 NOTE — Telephone Encounter (Signed)
Pt called and said she needs her Pentasa on Sat. She was scheduled to see Dr. Oneida Alar on 01/17/2011, but can't do that since she will be going on vacation. Manuela Schwartz has sent a couple of letters for pt to call and schedule before now. Please advise! [facility] [place].

## 2011-01-08 ENCOUNTER — Ambulatory Visit (HOSPITAL_COMMUNITY): Payer: BC Managed Care – PPO | Admitting: Oncology

## 2011-01-17 ENCOUNTER — Ambulatory Visit (INDEPENDENT_AMBULATORY_CARE_PROVIDER_SITE_OTHER): Payer: BC Managed Care – PPO | Admitting: Gastroenterology

## 2011-01-17 ENCOUNTER — Other Ambulatory Visit (HOSPITAL_COMMUNITY): Payer: Self-pay | Admitting: Oncology

## 2011-01-17 ENCOUNTER — Encounter: Payer: Self-pay | Admitting: Gastroenterology

## 2011-01-17 VITALS — BP 133/79 | HR 73 | Temp 97.2°F | Ht 64.0 in | Wt 207.8 lb

## 2011-01-17 DIAGNOSIS — D509 Iron deficiency anemia, unspecified: Secondary | ICD-10-CM

## 2011-01-17 DIAGNOSIS — K5000 Crohn's disease of small intestine without complications: Secondary | ICD-10-CM | POA: Insufficient documentation

## 2011-01-17 NOTE — Progress Notes (Signed)
  Subjective:    Patient ID: [name], female    DOB: 1950-09-23, 60 y.o.   MRN: [id number]  PCP: Leanord Hawking  1o HEME: NEIJSTROM  HPI Takin 8 Penatasa daily. Last labs JAN 2012-Hb 12.1, FERRITIN 32. Can't remember last IVFE/ Appt pending in July with Dr. Mariel Sleet. Feeling better. Bms: several times a day nl. No rectal bleeding or blk tarry stools. Appetite: too good.  No weight loss or abd pain. Had stomach virus APr or May and now Sx resolved. Had HSY for FeDA  Past Medical History  Diagnosis Date  . Gastric ulcer   . Hives   . Fibromyalgia   . Obesity   . Endometriosis     HISTORY  LEADING TO HYSTERECTOMY  . Helicobacter pylori gastritis     HISTORY  . Iron (Fe) deficiency anemia 2009    JAN 2012 NL HB & FERRITIN  . Crohn disease JULY 2011 FEDA    PERSISTENT TI ULCERS seen on CE, NO NSAID USE   Past Surgical History  Procedure Date  . Ovarian cyst removal   . Total abdominal hysterectomy w/ bilateral salpingoophorectomy   . Upper gastrointestinal endoscopy JULY 2011 DIARRHEA/FEDA     Bx-H.PYLORI GASTRITIS, NL DUODENUM   Allergies  Allergen Reactions  . Celebrex (Celecoxib) Hives and Itching  . Sulfonamide Derivatives    Current Outpatient Prescriptions  Medication Sig Dispense Refill  . acetaminophen (TYLENOL) 325 MG tablet Take 650 mg by mouth as needed.        . hydrOXYzine (ATARAX) 25 MG tablet Take 25 mg by mouth as needed.        . mesalamine (PENTASA) 500 MG CR capsule Take 2 capsules (1,000 mg total) by mouth 4 (four) times daily.  240 capsule  2  . omeprazole (PRILOSEC) 20 MG capsule           Review of Systems  All other systems reviewed and are negative.       Objective:   Physical Exam  Constitutional: She is oriented to person, place, and time. She appears well-developed and well-nourished. No distress.  HENT:  Head: Normocephalic and atraumatic.  Mouth/Throat: Oropharynx is clear and moist.  Eyes: Pupils are equal, round, and reactive to  light.  Cardiovascular: Normal rate, regular rhythm and normal heart sounds.   Pulmonary/Chest: Effort normal and breath sounds normal.  Abdominal: Soft. Bowel sounds are normal. She exhibits no distension. There is no tenderness.  Musculoskeletal: She exhibits no edema.  Neurological: She is alert and oriented to person, place, and time.  Skin: No rash (IN PRETIBIAL AREA) noted.          Assessment & Plan:  g

## 2011-01-17 NOTE — Assessment & Plan Note (Signed)
Sx fairly well controlled.  Pt requiring less IVFE. Will discuss with Dr. Mariel Sleet. Check HFP. Consider H.pylori stool Ag. Continue Pentasa. May take 2 po bid. Pt instructed to increase to QID if abd pain/diarrhea occur. OPV in 6 mos per pt request. Pt declined TCS due to cost of medical bills.

## 2011-01-18 ENCOUNTER — Encounter: Payer: Self-pay | Admitting: Gastroenterology

## 2011-01-18 NOTE — Progress Notes (Signed)
Reminder in epic to follow up in 6 months °

## 2011-01-18 NOTE — Assessment & Plan Note (Signed)
ADDENDUM [id number]-Spoke with Mr. Sheldon Silvan, PA-c. Pt has had 2 IVFE since AUG 2011.

## 2011-01-18 NOTE — Progress Notes (Signed)
Cc to PCP & Dr. Neijstrom 

## 2011-02-04 ENCOUNTER — Other Ambulatory Visit: Payer: Self-pay | Admitting: Gastroenterology

## 2011-02-04 ENCOUNTER — Encounter (HOSPITAL_COMMUNITY): Payer: BC Managed Care – PPO | Attending: Oncology | Admitting: Oncology

## 2011-02-04 DIAGNOSIS — D509 Iron deficiency anemia, unspecified: Secondary | ICD-10-CM | POA: Insufficient documentation

## 2011-02-04 DIAGNOSIS — K5000 Crohn's disease of small intestine without complications: Secondary | ICD-10-CM

## 2011-02-04 DIAGNOSIS — D508 Other iron deficiency anemias: Secondary | ICD-10-CM

## 2011-02-04 LAB — CBC
HCT: 41 % (ref 36.0–46.0)
Hemoglobin: 13.7 g/dL (ref 12.0–15.0)
MCV: 85.8 fL (ref 78.0–100.0)
RBC: 4.78 MIL/uL (ref 3.87–5.11)
WBC: 9.3 10*3/uL (ref 4.0–10.5)

## 2011-02-04 LAB — HEPATIC FUNCTION PANEL
ALT: 24 U/L (ref 0–35)
AST: 14 U/L (ref 0–37)
Albumin: 3.5 g/dL (ref 3.5–5.2)
Total Bilirubin: 0.2 mg/dL — ABNORMAL LOW (ref 0.3–1.2)

## 2011-02-04 NOTE — Progress Notes (Signed)
[name] presented for labwork. Labs per MD order drawn via Peripheral Line 23 gauge needle inserted in rt ac  Procedure without incident.   Patient tolerated procedure well.

## 2011-02-06 ENCOUNTER — Encounter (HOSPITAL_COMMUNITY): Payer: BC Managed Care – PPO | Admitting: Oncology

## 2011-02-06 VITALS — BP 119/74 | HR 64 | Temp 98.0°F | Wt 213.4 lb

## 2011-02-06 DIAGNOSIS — D509 Iron deficiency anemia, unspecified: Secondary | ICD-10-CM

## 2011-02-06 NOTE — Progress Notes (Signed)
This office note has been dictated.

## 2011-02-06 NOTE — Patient Instructions (Signed)
[facility]  Discharge Instructions  RECOMMENDATIONS MADE BY THE CONSULTANT AND ANY TEST RESULTS WILL BE SENT TO YOUR REFERRING DOCTOR.   EXAM FINDINGS BY MD TODAY AND SIGNS AND SYMPTOMS TO REPORT TO CLINIC OR PRIMARY MD: DOING WELL.   INSTRUCTIONS GIVEN AND DISCUSSED: Other :  REPORT INCREASED ICE INTAKE OR OTHER SYMPTOMS THAT YOUR IRON IS GETTING LOW   SPECIAL INSTRUCTIONS/FOLLOW-UP: Lab work Needed:  In 4 months  and Return to Clinic on after labs done.   I acknowledge that I have been informed and understand all the instructions given to me and received a copy. I do not have any more questions at this time, but understand that I may call the Specialty Clinic at [facility] at [phone] during business hours should I have any further questions or need assistance in obtaining follow-up care.    __________________________________________  _____________  __________ Signature of Patient or Authorized Representative            Date                   Time    __________________________________________ Nurse's Signature

## 2011-02-06 NOTE — Progress Notes (Signed)
CC:   Scott A. Gerda Diss, MD Jonette Eva, M.D.  DIAGNOSIS: 1. Iron-deficiency anemia, presented with a hemoglobin of 7.3 and a     ferritin of 2, requiring IV iron in the form of Feraheme.  Her most     recent dose was on 09/18/2010, and it was a dose of 1020 mg. 2. Crohn disease. 3. History of Helicobacter pylori gastritis which has been treated.  [name] has total loss of her iron deficiency symptomatology.  She feels better, working full-time.  Her hemoglobin the other day was 13.7 g.  Ferritin was over 200.  So we just need to monitor that.  We will see her back in November on the 13th for a CBC and ferritin.  That has been scheduled.  We will see her after that to decide whether she needs any further iron infusions at this time, but right now she has responded very, very well.  She is still on her Pentasa as well for the Crohn's.    ______________________________ Ladona Horns. Mariel Sleet, MD ESN/MEDQ  D:  02/06/2011  T:  02/06/2011  Job:  [id number]

## 2011-02-07 ENCOUNTER — Other Ambulatory Visit: Payer: Self-pay

## 2011-02-09 NOTE — Progress Notes (Signed)
NL HB AND FERRITIN

## 2011-02-09 NOTE — Progress Notes (Signed)
Noted. NL CBC/FERRITIN

## 2011-02-12 NOTE — Progress Notes (Signed)
Labs Cc to PCP and a 6 mon f/u reminder is in the computer

## 2011-02-21 NOTE — Progress Notes (Signed)
noted 

## 2011-03-18 ENCOUNTER — Other Ambulatory Visit: Payer: Self-pay

## 2011-03-18 MED ORDER — OMEPRAZOLE 20 MG PO CPDR: 20.0000 mg | DELAYED_RELEASE_CAPSULE | Freq: Every day | ORAL | Status: DC

## 2011-03-18 NOTE — Telephone Encounter (Signed)
Pt would like her Omeprazole Rx filled before 2:00 pm today. She goes to work at 2:00 pm and doesn't get off work til after pharmacy closes. I told her that the providers are busy and I was not sure it would be done by then. Told her that is why it is very important to send pharmacy request before she runs out. [facility] [place]

## 2011-06-11 ENCOUNTER — Encounter (HOSPITAL_COMMUNITY): Payer: BC Managed Care – PPO | Attending: Oncology

## 2011-06-11 DIAGNOSIS — D509 Iron deficiency anemia, unspecified: Secondary | ICD-10-CM

## 2011-06-11 DIAGNOSIS — K5090 Crohn's disease, unspecified, without complications: Secondary | ICD-10-CM | POA: Insufficient documentation

## 2011-06-11 DIAGNOSIS — IMO0001 Reserved for inherently not codable concepts without codable children: Secondary | ICD-10-CM | POA: Insufficient documentation

## 2011-06-11 LAB — CBC
Platelets: 260 10*3/uL (ref 150–400)
RDW: 13.5 % (ref 11.5–15.5)
WBC: 8.2 10*3/uL (ref 4.0–10.5)

## 2011-06-12 ENCOUNTER — Encounter (HOSPITAL_BASED_OUTPATIENT_CLINIC_OR_DEPARTMENT_OTHER): Payer: BC Managed Care – PPO | Admitting: Oncology

## 2011-06-12 ENCOUNTER — Encounter (HOSPITAL_COMMUNITY): Payer: Self-pay | Admitting: Oncology

## 2011-06-12 VITALS — BP 149/79 | HR 70 | Temp 97.7°F | Ht 62.25 in | Wt 197.0 lb

## 2011-06-12 DIAGNOSIS — L989 Disorder of the skin and subcutaneous tissue, unspecified: Secondary | ICD-10-CM

## 2011-06-12 DIAGNOSIS — D509 Iron deficiency anemia, unspecified: Secondary | ICD-10-CM

## 2011-06-12 NOTE — Progress Notes (Signed)
CC:   Scott A. Gerda Diss, MD  DIAGNOSES: 1. Severe iron-deficiency anemia, presented with a hemoglobin of 7.3     and a ferritin of 2.  She is status post IV iron in the form of     Feraheme.  Her last infusion was 09/18/2010.  She presented here in     July 2011.  She responded well to IV Feraheme in March of 2011.     Her requirements are quite intense.  I think she is finally     becoming stabilized. 2. History of Crohn disease. 3. History of fibromyalgia. 4. Obesity. 5. Endometriosis leading to hysterectomy in 2003. 6. History of Helicobacter pylori gastritis. 7. Ovarian cystectomy in the late 1980s. Zelie's labs the other day were really quite good.  She had a CBC which was normal on the 13th, she had a ferritin of 212.  It has dropped only 7 ng/mL in 4 months, so we will give her 7 months this time.  She also wanted me look at a skin lesion on the top of her head and in the hair at the top of the head she has a small, perhaps 5 mm across, 3 mm wide that is pigmented with irregular borders, slightly elevated off the scalp line.  It is worrisome for a possible skin cancer.  We will get her a dermatology appointment as soon as possible.    ______________________________ Ladona Horns. Mariel Sleet, MD ESN/MEDQ  D:  06/12/2011  T:  06/12/2011  Job:  [id number]

## 2011-06-12 NOTE — Progress Notes (Signed)
This office note has been dictated.

## 2011-06-12 NOTE — Patient Instructions (Signed)
[name]  [id number] Jul 22, 1951  [facility]  Discharge Instructions  RECOMMENDATIONS MADE BY THE CONSULTANT AND ANY TEST RESULTS WILL BE SENT TO YOUR REFERRING DOCTOR.   EXAM FINDINGS BY MD TODAY AND SIGNS AND SYMPTOMS TO REPORT TO CLINIC OR PRIMARY MD:  Your blood counts are good.  Report any increase in fatigue, shortness of breath or increased ice intake.  MEDICATIONS PRESCRIBED: none      SPECIAL INSTRUCTIONS/FOLLOW-UP: Lab work Needed in June, Return to Clinic on after labs in June and Other (Referral/Appointments) Dermatology consult.  I will call you with the date and time of the appointment.  Will try for a Wednesday.   I acknowledge that I have been informed and understand all the instructions given to me and received a copy. I do not have any more questions at this time, but understand that I may call the Specialty Clinic at [facility] at [phone] during business hours should I have any further questions or need assistance in obtaining follow-up care.    __________________________________________  _____________  __________ Signature of Patient or Authorized Representative            Date                   Time    __________________________________________ Nurse's Signature

## 2011-06-13 ENCOUNTER — Telehealth: Payer: Self-pay | Admitting: Gastroenterology

## 2011-06-13 ENCOUNTER — Encounter: Payer: Self-pay | Admitting: Gastroenterology

## 2011-06-13 NOTE — Telephone Encounter (Signed)
Pt needs to have HFP done prior to OV in Dec

## 2011-06-14 ENCOUNTER — Telehealth (HOSPITAL_COMMUNITY): Payer: Self-pay

## 2011-06-14 NOTE — Telephone Encounter (Signed)
Notified that Dermatology appointment will be on 11/28 at 9:30am with PA.  Dr. Charlton Haws not available on Wednesdays and first available appointment would be @ the end of December to see Dr. Charlton Haws.  Patient agrees to see PA.

## 2011-06-17 NOTE — Telephone Encounter (Signed)
REVIEWED. AGREE. 

## 2011-06-18 ENCOUNTER — Other Ambulatory Visit: Payer: Self-pay

## 2011-06-18 NOTE — Telephone Encounter (Signed)
Lab order mailed for pt to do prior to office visit in December.

## 2011-07-10 ENCOUNTER — Ambulatory Visit (INDEPENDENT_AMBULATORY_CARE_PROVIDER_SITE_OTHER): Payer: BC Managed Care – PPO | Admitting: Gastroenterology

## 2011-07-10 ENCOUNTER — Encounter: Payer: Self-pay | Admitting: Gastroenterology

## 2011-07-10 VITALS — BP 139/71 | HR 77 | Temp 97.2°F | Ht 63.0 in | Wt 199.6 lb

## 2011-07-10 DIAGNOSIS — K2970 Gastritis, unspecified, without bleeding: Secondary | ICD-10-CM

## 2011-07-10 DIAGNOSIS — D509 Iron deficiency anemia, unspecified: Secondary | ICD-10-CM

## 2011-07-10 DIAGNOSIS — A048 Other specified bacterial intestinal infections: Secondary | ICD-10-CM

## 2011-07-10 DIAGNOSIS — K5000 Crohn's disease of small intestine without complications: Secondary | ICD-10-CM

## 2011-07-10 MED ORDER — MESALAMINE ER 500 MG PO CPCR: 1000.0000 mg | ORAL_CAPSULE | Freq: Four times a day (QID) | ORAL | Status: DC

## 2011-07-10 NOTE — Patient Instructions (Signed)
Take PENTASA 2 PILLS TWICE A DAY IF YOUR SYMPTOMS ARE CONTROLLED AND 2 PILLS FOUR TIMES A DAY IF THEY ARE NOT. IF YOU HAVE TO INCREASE YOUR PILLS TO FOUR TIMES A DAY, CALL ME. SUBMIT YOUR STOOL SAMPLE. FOLLOW UP IN 6 MOS.

## 2011-07-10 NOTE — Progress Notes (Signed)
Reminder in epic to follow up in 6 months °

## 2011-07-11 NOTE — Progress Notes (Signed)
  Subjective:    Patient ID: [name], female    DOB: 1951/04/08, 60 y.o.   MRN: [id number]  PCP: [facility]  HPI PT DOING WELL. CONCERNED ABOUT MULTIPLE LABS BEING DRAWN. NO RECTAL BLEEDING OR MELENA. HB NL SINCE DEC 2011. FERRITIN >200 SINCE JUL 2012. TOLERATING MEDS. WANTS REFILLS. NO NOCTURNAL STOOLS, ABDOMINAL PAIN, NAUSEA OR VOMITING.  Past Medical History  Diagnosis Date  . Gastric ulcer   . Hives   . Fibromyalgia   . Obesity   . Endometriosis     HISTORY  LEADING TO HYSTERECTOMY  . Helicobacter pylori gastritis     HISTORY  . Iron (Fe) deficiency anemia 2009    JAN 2012 NL HB & FERRITIN  . Crohn disease JULY 2011 FEDA    PERSISTENT TI ULCERS seen on CE, NO NSAID USE  . Plantar fasciitis    Past Surgical History  Procedure Date  . Ovarian cyst removal   . Total abdominal hysterectomy w/ bilateral salpingoophorectomy   . Upper gastrointestinal endoscopy JULY 2011 DIARRHEA/FEDA     Bx-H.PYLORI GASTRITIS, NL DUODENUM   Allergies  Allergen Reactions  . Celebrex (Celecoxib) Hives and Itching  . Sulfonamide Derivatives     Family History  Problem Relation Age of Onset  . Colon polyps Neg Hx   . Colon cancer Neg Hx   . Diabetes Mother   . Cancer Father       Review of Systems 204 LBS SEP 2011    Objective:   Physical Exam  Vitals reviewed. Constitutional: She is oriented to person, place, and time. She appears well-developed and well-nourished.  HENT:  Head: Normocephalic and atraumatic.  Mouth/Throat: Oropharynx is clear and moist. No oropharyngeal exudate.  Eyes: Pupils are equal, round, and reactive to light. No scleral icterus.  Cardiovascular: Normal rate, regular rhythm and normal heart sounds.   Pulmonary/Chest: Effort normal and breath sounds normal. No respiratory distress.  Abdominal: Soft. Bowel sounds are normal. She exhibits no distension. There is no tenderness.  Musculoskeletal: Normal range of motion. She exhibits no edema.  Neurological: She  is alert and oriented to person, place, and time.       NO FOCAL DEFICITS           Assessment & Plan:

## 2011-07-11 NOTE — Assessment & Plan Note (Signed)
NEED H PYLORI STOOL AG.

## 2011-07-11 NOTE — Progress Notes (Signed)
Cc to PCP 

## 2011-07-11 NOTE — Assessment & Plan Note (Signed)
FERRITIN > 200 SINCE JUL 2012

## 2011-07-11 NOTE — Assessment & Plan Note (Addendum)
Take PENTASA 2 PILLS TWICE A DAY IF YOUR SYMPTOMS ARE CONTROLLED AND 2 PILLS FOUR TIMES A DAY IF THEY ARE NOT. IF YOU HAVE TO INCREASE YOUR PILLS TO FOUR TIMES A DAY, CALL ME. FOLLOW UP IN 6 MOS.

## 2011-07-15 NOTE — Progress Notes (Signed)
Call pt to remind to get stool test.

## 2011-07-16 NOTE — Progress Notes (Signed)
Pt said Dr. Darrick Penna told her to do in a couple of weeks whenever she got ready to do.

## 2011-07-21 ENCOUNTER — Emergency Department: Payer: Self-pay | Admitting: Internal Medicine

## 2011-12-30 ENCOUNTER — Encounter: Payer: Self-pay | Admitting: Gastroenterology

## 2012-01-07 ENCOUNTER — Encounter (HOSPITAL_COMMUNITY): Payer: BC Managed Care – PPO | Attending: Oncology

## 2012-01-07 DIAGNOSIS — D509 Iron deficiency anemia, unspecified: Secondary | ICD-10-CM

## 2012-01-07 LAB — CBC
HCT: 38.5 % (ref 36.0–46.0)
Hemoglobin: 13 g/dL (ref 12.0–15.0)
MCH: 28.1 pg (ref 26.0–34.0)
MCHC: 33.8 g/dL (ref 30.0–36.0)
MCV: 83.2 fL (ref 78.0–100.0)

## 2012-01-07 NOTE — Progress Notes (Signed)
[name] presented for labwork. Labs per MD order drawn via Peripheral Line 24 gauge needle inserted in rt ac Good blood return present. Procedure without incident.  Needle removed intact. Patient tolerated procedure well.

## 2012-01-08 ENCOUNTER — Ambulatory Visit (HOSPITAL_COMMUNITY): Payer: BC Managed Care – PPO | Admitting: Oncology

## 2012-01-22 ENCOUNTER — Ambulatory Visit (HOSPITAL_COMMUNITY): Payer: BC Managed Care – PPO | Admitting: Oncology

## 2012-01-28 ENCOUNTER — Encounter (HOSPITAL_COMMUNITY): Payer: BC Managed Care – PPO | Attending: Oncology | Admitting: Oncology

## 2012-01-28 VITALS — BP 132/76 | HR 81 | Temp 97.5°F | Wt 209.2 lb

## 2012-01-28 DIAGNOSIS — K5289 Other specified noninfective gastroenteritis and colitis: Secondary | ICD-10-CM

## 2012-01-28 DIAGNOSIS — D509 Iron deficiency anemia, unspecified: Secondary | ICD-10-CM | POA: Insufficient documentation

## 2012-01-28 DIAGNOSIS — E669 Obesity, unspecified: Secondary | ICD-10-CM

## 2012-01-28 NOTE — Patient Instructions (Signed)
[facility]  Discharge Instructions  RECOMMENDATIONS MADE BY THE CONSULTANT AND ANY TEST RESULTS WILL BE SENT TO YOUR REFERRING DOCTOR.   Return to clinic in 6 months for lab work and then to see Dr.Neijstrom.   I acknowledge that I have been informed and understand all the instructions given to me and received a copy. I do not have any more questions at this time, but understand that I may call the Specialty Clinic at [facility] at [phone] during business hours should I have any further questions or need assistance in obtaining follow-up care.    __________________________________________  _____________  __________ Signature of Patient or Authorized Representative            Date                   Time    __________________________________________ Nurse's Signature   

## 2012-01-28 NOTE — Progress Notes (Signed)
[place], [place] [id number]  1. ANEMIA, IRON DEFICIENCY  CBC, Ferritin    CURRENT THERAPY: Observation with IV Feraheme as needed  INTERVAL HISTORY: [name] 61 y.o. female returns for  regular  visit for followup of  Severe iron-deficiency anemia, presented with a hemoglobin of 7.3 and a ferritin of 2. She is status post IV iron in the form of Feraheme. Her last infusion was 09/18/2010. She presented here in July 2011. She responded well to IV Feraheme in March of 2011. Her were requirements are quite intense. I think she is finally stabilizing.  I personally reviewed and went over laboratory results with the patient.  Her ferritin has dropped about 80 points in 6 months but is well within normal limits at 125.  I provided the patient education regarding iron deficiency anemia, ferritin, ferritin's role within the body, and ferritin's association with hemoglobin.  Her hemoglobin is well within normal limits and very stable.   The patient has seen dermatology regarding her scalp lesion and she reports that everything is fine in that regard.  So we will continue to monitor her counts and ferritin and we will check again in 6 months.  We will provide Feraheme IV pending lab results.   She denies any complaints other than fatigue which is not related her her superb lab work.  I recommend follow-up with PCP regarding fatigue.      Past Medical History  Diagnosis Date  . Gastric ulcer   . Hives   . Fibromyalgia   . Obesity   . Endometriosis     HISTORY  LEADING TO HYSTERECTOMY  . Helicobacter pylori gastritis     HISTORY  . Iron (Fe) deficiency anemia 2009    JAN 2012 NL HB & FERRITIN  . Crohn disease JULY 2011 FEDA    PERSISTENT TI ULCERS seen on CE, NO NSAID USE  . Plantar fasciitis     has HELICOBACTER PYLORI GASTRITIS; ANEMIA, IRON DEFICIENCY; and Crohn's ileitis on her problem list.     is allergic to celebrex and sulfonamide derivatives.  Ms.  Hart does not currently have medications on file.  Past Surgical History  Procedure Date  . Ovarian cyst removal   . Total abdominal hysterectomy w/ bilateral salpingoophorectomy   . Upper gastrointestinal endoscopy JULY 2011 DIARRHEA/FEDA     Bx-H.PYLORI GASTRITIS, NL DUODENUM    Denies any headaches, dizziness, double vision, fevers, chills, night sweats, nausea, vomiting, diarrhea, constipation, chest pain, heart palpitations, shortness of breath, blood in stool, black tarry stool, urinary pain, urinary burning, urinary frequency, hematuria.   PHYSICAL EXAMINATION  ECOG PERFORMANCE STATUS: 0 - Asymptomatic  Filed Vitals:   01/28/12 1500  BP: 132/76  Pulse: 81  Temp: 97.5 F (36.4 C)    GENERAL:alert, no distress, well nourished, well developed, comfortable, cooperative, obese and smiling SKIN: skin color, texture, turgor are normal, no rashes or significant lesions HEAD: Normocephalic, No masses, lesions, tenderness or abnormalities EYES: normal, Conjunctiva are pink and non-injected EARS: External ears normal OROPHARYNX:lips, buccal mucosa, and tongue normal and mucous membranes are moist  NECK: supple, trachea midline LYMPH:  not examined BREAST:not examined LUNGS: clear to auscultation and percussion HEART: regular rate & rhythm, no murmurs, no gallops, S1 normal and S2 normal ABDOMEN:abdomen soft, non-tender, obese and normal bowel sounds BACK: Back symmetric, no curvature. EXTREMITIES:less then 2 second capillary refill, no joint deformities, effusion, or inflammation, no edema, no skin discoloration, no clubbing, no cyanosis  NEURO: alert & oriented x 3 with fluent speech, no focal motor/sensory deficits, gait normal   LABORATORY DATA: CBC    Component Value Date/Time   WBC 8.3 01/07/2012 1443   RBC 4.63 01/07/2012 1443   HGB 13.0 01/07/2012 1443   HCT 38.5 01/07/2012 1443   PLT 242 01/07/2012 1443   MCV 83.2 01/07/2012 1443   MCH 28.1 01/07/2012 1443   MCHC  33.8 01/07/2012 1443   RDW 14.0 01/07/2012 1443   LYMPHSABS 2.3 05/08/2010 1622   MONOABS 0.6 05/08/2010 1622   EOSABS 0.1 05/08/2010 1622   BASOSABS 0.0 05/08/2010 1622    Lab Results  Component Value Date   FERRITIN 125 01/07/2012      ASSESSMENT:  1. Severe iron-deficiency anemia, presented with a hemoglobin of 7.3 and a ferritin of 2. She is status post IV iron in the form of Feraheme. Her last infusion was 09/18/2010. She presented here in July 2011. She responded well to IV Feraheme in March of 2011. Her requirements are quite intense. I think she is finally becoming stabilized.  2. History of Crohn disease.  3. History of fibromyalgia.  4. Obesity.  5. Endometriosis leading to hysterectomy in 2003.  6. History of Helicobacter pylori gastritis.  7. Ovarian cystectomy in the late 1980s.   PLAN:  1. I personally reviewed and went over laboratory results with the patient. 2. Patient education regarding iron deficiency anemia.  3. Lab work in 6 months: CBC, ferritin 4. Return in 6 months for follow-up.   All questions were answered. The patient knows to call the clinic with any problems, questions or concerns. We can certainly see the patient much sooner if necessary.  The patient and plan discussed with Everardo All, MD and he is in agreement with the aforementioned.  KEFALAS,THOMAS

## 2012-04-14 ENCOUNTER — Encounter: Payer: Self-pay | Admitting: Gastroenterology

## 2012-04-15 ENCOUNTER — Encounter: Payer: Self-pay | Admitting: Gastroenterology

## 2012-04-15 ENCOUNTER — Ambulatory Visit (INDEPENDENT_AMBULATORY_CARE_PROVIDER_SITE_OTHER): Payer: BC Managed Care – PPO | Admitting: Gastroenterology

## 2012-04-15 ENCOUNTER — Other Ambulatory Visit: Payer: Self-pay

## 2012-04-15 VITALS — BP 122/67 | HR 77 | Temp 97.8°F | Ht 65.0 in | Wt 216.4 lb

## 2012-04-15 DIAGNOSIS — A048 Other specified bacterial intestinal infections: Secondary | ICD-10-CM

## 2012-04-15 DIAGNOSIS — K5090 Crohn's disease, unspecified, without complications: Secondary | ICD-10-CM

## 2012-04-15 DIAGNOSIS — D509 Iron deficiency anemia, unspecified: Secondary | ICD-10-CM

## 2012-04-15 DIAGNOSIS — K5000 Crohn's disease of small intestine without complications: Secondary | ICD-10-CM

## 2012-04-15 MED ORDER — OMEPRAZOLE 20 MG PO CPDR: 20.0000 mg | DELAYED_RELEASE_CAPSULE | Freq: Every day | ORAL | Status: DC

## 2012-04-15 MED ORDER — MESALAMINE ER 500 MG PO CPCR: 1000.0000 mg | ORAL_CAPSULE | Freq: Four times a day (QID) | ORAL | Status: DC

## 2012-04-15 NOTE — Patient Instructions (Addendum)
I have sent refills to the pharmacy for Pentasa and Prilosec.  Take Pentasa 2 pills twice a day. Prescription is written for 2 pills four times a day, if needed. If you have to increase, please call us.   Please have blood work done in January. We will be reminding you to have this done.  Return in 1 year or sooner if needed.

## 2012-04-15 NOTE — Progress Notes (Signed)
Referring Provider: Babs Sciara, MD Primary Care Physician:  Lilyan Punt, MD Primary Gastroenterologist: Dr. Darrick Penna   Chief Complaint  Patient presents with  . Medication Refill    HPI:   61 year old female with hx of Crohn's ileitis, H.pylori gastritis, needs refills. 2 Pentasa BID. No problems, no diarrhea or constipation. No rectal bleeding. No abdominal pain. Hasn't completed the H.pylori stool antigen. No problems with GERD. Would like refills for a year. Doesn't want to get bloodwork done today. Hx of mildly elevated AP in past.   Past Medical History  Diagnosis Date  . Gastric ulcer   . Hives   . Fibromyalgia   . Obesity   . Endometriosis     HISTORY  LEADING TO HYSTERECTOMY  . Helicobacter pylori gastritis     HISTORY  . Iron (Fe) deficiency anemia 2009    JAN 2012 NL HB & FERRITIN  . Crohn disease JULY 2011 FEDA    PERSISTENT TI ULCERS seen on CE, NO NSAID USE  . Plantar fasciitis     Past Surgical History  Procedure Date  . Ovarian cyst removal   . Total abdominal hysterectomy w/ bilateral salpingoophorectomy   . Upper gastrointestinal endoscopy JULY 2011 DIARRHEA/FEDA     Bx-H.PYLORI GASTRITIS, NL DUODENUM  . Ileocolonoscopy 10/14/2007    Multiple 1-3 mm terminal ileum ulcers biopsied  The ulcers seemed to only involve the last 5 cm of her terminal ileum, and 10 cm of her ileum was visualized.  Otherwise the colon  was without polyps, masses, diverticula, or AVMs.  She had a normal retroflexed view of the rectum  . Esophagogastroduodenoscopy 10/14/2007    Esophagus showed 2 cm pink tongue seen extending from the GE junction.  Biopsies obtained via cold forceps.  Otherwise the  esophagus was without erosions, mass, ulceration, or stricture / . Large hiatal hernia pouch with linear erosions, and two 3-6 mmulcers seen in the pouch.  Biopsies obtained via cold forceps to  evaluate for H. pylori gastritis or malignancy    Current Outpatient Prescriptions    Medication Sig Dispense Refill  . acetaminophen (TYLENOL) 325 MG tablet Take 650 mg by mouth as needed.        . mesalamine (PENTASA) 500 MG CR capsule Take 2 capsules (1,000 mg total) by mouth 4 (four) times daily.  240 capsule  11  . omeprazole (PRILOSEC) 20 MG capsule Take 1 capsule (20 mg total) by mouth daily.  31 capsule  11  . hydrOXYzine (ATARAX) 25 MG tablet Take 25 mg by mouth as needed.          Allergies as of 04/15/2012 - Review Complete 04/15/2012  Allergen Reaction Noted  . Celebrex (celecoxib) Hives and Itching 01/07/2011  . Sulfonamide derivatives      Family History  Problem Relation Age of Onset  . Colon polyps Neg Hx   . Colon cancer Neg Hx   . Diabetes Mother   . Cancer Father     History   Social History  . Marital Status: Married    Spouse Name: N/A    Number of Children: N/A  . Years of Education: N/A   Social History Main Topics  . Smoking status: Never Smoker   . Smokeless tobacco: None  . Alcohol Use: No  . Drug Use: No  . Sexually Active: None   Other Topics Concern  . None   Social History Narrative  . None    Review of Systems: Gen: Denies fever, chills, anorexia.  Denies fatigue, weakness, weight loss.  CV: Denies chest pain, palpitations, syncope, peripheral edema, and claudication. Resp: Denies dyspnea at rest, cough, wheezing, coughing up blood, and pleurisy. GI: Denies vomiting blood, jaundice, and fecal incontinence.   Denies dysphagia or odynophagia. Derm: Denies rash, itching, dry skin Psych: Denies depression, anxiety, memory loss, confusion. No homicidal or suicidal ideation.  Heme: Denies bruising, bleeding, and enlarged lymph nodes.  Physical Exam: BP 122/67  Pulse 77  Temp 97.8 F (36.6 C) (Temporal)  Ht 5\' 5"  (1.651 m)  Wt 216 lb 6.4 oz (98.158 kg)  BMI 36.01 kg/m2 General:   Alert and oriented. No distress noted. Pleasant and cooperative.  Head:  Normocephalic and atraumatic. Eyes:  Conjuctiva clear without  scleral icterus. Mouth:  Oral mucosa pink and moist. Good dentition. No lesions. Neck:  Supple, without mass or thyromegaly. Heart:  S1, S2 present without murmurs, rubs, or gallops. Regular rate and rhythm. Abdomen:  +BS, soft, non-tender and non-distended. No rebound or guarding. No HSM or masses noted. Msk:  Symmetrical without gross deformities. Normal posture. Extremities:  Without edema. Neurologic:  Alert and  oriented x4;  grossly normal neurologically. Skin:  Intact without significant lesions or rashes. Cervical Nodes:  No significant cervical adenopathy. Psych:  Alert and cooperative. Normal mood and affect.

## 2012-04-16 ENCOUNTER — Telehealth: Payer: Self-pay | Admitting: Gastroenterology

## 2012-04-16 DIAGNOSIS — K279 Peptic ulcer, site unspecified, unspecified as acute or chronic, without hemorrhage or perforation: Secondary | ICD-10-CM

## 2012-04-16 NOTE — Telephone Encounter (Signed)
I forgot to give pt the order for H.pylori stool antigen yesterday. Hx of H.pylori.   Please have her off PPI X 14 days, then obtain stool sample. Then, she can resume PPI.

## 2012-04-16 NOTE — Progress Notes (Signed)
Faxed to PCP

## 2012-04-16 NOTE — Assessment & Plan Note (Signed)
Need H.pylori stool antigen.

## 2012-04-16 NOTE — Assessment & Plan Note (Signed)
Stable

## 2012-04-16 NOTE — Telephone Encounter (Signed)
Called and informed pt. She is aware to stop PPI x 14 days before getting the sample. Container and order at front for pick up.

## 2012-04-16 NOTE — Assessment & Plan Note (Addendum)
Stable on Pentasa 2 BID. No flares. Continue current regimen. Refills provided X 1 year.  Return in 1 year or sooner if necessary. HFP and BMP in Jan. Pt does not want to do now. (hx of elevated AP in past).

## 2012-05-14 NOTE — Progress Notes (Signed)
JUN 2013 NL HB  REVIEWED.

## 2012-05-25 ENCOUNTER — Telehealth: Payer: Self-pay

## 2012-05-25 NOTE — Telephone Encounter (Signed)
LMOM to remind pt that she has not picked up her container and instructions for the H. Pylori stool test.

## 2012-06-30 ENCOUNTER — Other Ambulatory Visit: Payer: Self-pay

## 2012-06-30 DIAGNOSIS — K5090 Crohn's disease, unspecified, without complications: Secondary | ICD-10-CM

## 2012-08-03 ENCOUNTER — Other Ambulatory Visit (HOSPITAL_COMMUNITY): Payer: BC Managed Care – PPO

## 2012-08-04 ENCOUNTER — Ambulatory Visit (HOSPITAL_COMMUNITY): Payer: BC Managed Care – PPO | Admitting: Oncology

## 2012-08-04 ENCOUNTER — Encounter (HOSPITAL_COMMUNITY): Payer: BC Managed Care – PPO | Attending: Oncology

## 2012-08-04 DIAGNOSIS — K5090 Crohn's disease, unspecified, without complications: Secondary | ICD-10-CM | POA: Insufficient documentation

## 2012-08-04 DIAGNOSIS — D509 Iron deficiency anemia, unspecified: Secondary | ICD-10-CM | POA: Insufficient documentation

## 2012-08-04 LAB — CBC
HCT: 38.2 % (ref 36.0–46.0)
MCH: 27.5 pg (ref 26.0–34.0)
MCV: 83.2 fL (ref 78.0–100.0)
RBC: 4.59 MIL/uL (ref 3.87–5.11)
WBC: 7.9 10*3/uL (ref 4.0–10.5)

## 2012-08-04 NOTE — Progress Notes (Signed)
[name] presented for labwork. Labs per MD order drawn via Peripheral Line 23 gauge needle inserted in right AC  Good blood return present. Procedure without incident.  Needle removed intact. Patient tolerated procedure well.   

## 2012-08-05 ENCOUNTER — Encounter (HOSPITAL_COMMUNITY): Payer: Self-pay | Admitting: Oncology

## 2012-08-05 ENCOUNTER — Encounter (HOSPITAL_BASED_OUTPATIENT_CLINIC_OR_DEPARTMENT_OTHER): Payer: BC Managed Care – PPO | Admitting: Oncology

## 2012-08-05 VITALS — BP 135/70 | HR 80 | Temp 97.6°F | Resp 18 | Wt 211.7 lb

## 2012-08-05 DIAGNOSIS — K5090 Crohn's disease, unspecified, without complications: Secondary | ICD-10-CM

## 2012-08-05 DIAGNOSIS — D509 Iron deficiency anemia, unspecified: Secondary | ICD-10-CM

## 2012-08-05 NOTE — Progress Notes (Signed)
1. Iron deficiency anemia with inadequate ability to absorbI oral iron as well as excessive iron losses secondary to her underlying Crohn's disease. 2.Crohn's disease on Pentasa 3.H/O fibromyalgia  She is not on NSAIDS any longer which is good. Unfortunately she is still losing iron with a drop in her ferritin from 125 to 81 now. Her last infusion of feraheme was in July 2012, so she has done remarkably well considering. She is starting to feel a little less energy so we will set her up for iv feraheme on Feb 5,2014 and then get her back in May for labs and to see Mina Marble. She and I both do not want her to become anemic again since she feels so much better.

## 2012-08-05 NOTE — Patient Instructions (Addendum)
[facility] Discharge Instructions  RECOMMENDATIONS MADE BY THE CONSULTANT AND ANY TEST RESULTS WILL BE SENT TO YOUR REFERRING PHYSICIAN.  EXAM FINDINGS BY THE PHYSICIAN TODAY AND SIGNS OR SYMPTOMS TO REPORT TO CLINIC OR PRIMARY PHYSICIAN: discussion by MD.  Your ferritin level has decreased to 81 and we will get you scheduled for a feraheme infusion in February.  MEDICATIONS PRESCRIBED:  none  INSTRUCTIONS GIVEN AND DISCUSSED: Report increased fatigue, shortness of breath, increased ice intake, etc.   SPECIAL INSTRUCTIONS/FOLLOW-UP: Blood work in May then to see PA in follow-up.  Thank you for choosing [facility] to provide your oncology and hematology care.  To afford each patient quality time with our providers, please arrive at least 15 minutes before your scheduled appointment time.  With your help, our goal is to use those 15 minutes to complete the necessary work-up to ensure our physicians have the information they need to help with your evaluation and healthcare recommendations.    Effective January 1st, 2014, we ask that you re-schedule your appointment with our physicians should you arrive 10 or more minutes late for your appointment.  We strive to give you quality time with our providers, and arriving late affects you and other patients whose appointments are after yours.    Again, thank you for choosing [facility].  Our hope is that these requests will decrease the amount of time that you wait before being seen by our physicians.       _____________________________________________________________  Should you have questions after your visit to [facility], please contact our office at [phone] between the hours of 8:30 a.m. and 5:00 p.m.  Voicemails left after 4:30 p.m. will not be returned until the following business day.  For prescription refill requests, have your pharmacy contact our office with your  prescription refill request.

## 2012-08-06 LAB — HEPATIC FUNCTION PANEL
ALT: 17 U/L (ref 0–35)
Bilirubin, Direct: <0.1 mg/dL (ref 0.0–0.3)
Total Protein: 7.1 g/dL (ref 6.0–8.3)

## 2012-08-06 LAB — BASIC METABOLIC PANEL
BUN: 14 mg/dL (ref 6–23)
Chloride: 106 mEq/L (ref 96–112)
Potassium: 3.4 mEq/L — ABNORMAL LOW (ref 3.5–5.3)
Sodium: 139 mEq/L (ref 135–145)

## 2012-08-13 ENCOUNTER — Other Ambulatory Visit: Payer: Self-pay

## 2012-08-13 DIAGNOSIS — E876 Hypokalemia: Secondary | ICD-10-CM

## 2012-08-13 NOTE — Progress Notes (Signed)
Quick Note:  Alk Phos is now normal. No need to repeat, can be rechecked at 1 year visit. BUN, Cr look good. Potassium just a smidge lower than normal. Has she had any blood work done since this draw? Let's recheck potassium. Make sure eating potassium rich foods; I don't see where she is on a diuretic that would cause this.   Please remind her of the H.pylori stool antigen :) ______

## 2012-08-13 NOTE — Progress Notes (Signed)
Quick Note:  LMOM to call. Lab order for the potassium faxed to [facility]. ______

## 2012-08-14 NOTE — Progress Notes (Signed)
Quick Note:  The lab order for the potassium and the H.Pylori Stool antigen order and container have been left at front for pick up. ______

## 2012-08-14 NOTE — Progress Notes (Signed)
Quick Note:  Called and informed pt. She said she will come by and pick up lab order to do at same time with other labs. She is aware that we will leave at 12:00 noon today. I told her she should do the lab right away and told her to eat potassium rich foods. She wanted to schedule appt with Dr. Oneida Alar while she is on the phone, Per Dawn she scheduled for 09/09/2012 because she had to have a 3:00 pm appt and she had other appts prior to then. ( I called back and LMOM that H.Pylori has not been done yet). ______

## 2012-09-01 ENCOUNTER — Telehealth: Payer: Self-pay | Admitting: Gastroenterology

## 2012-09-01 NOTE — Telephone Encounter (Signed)
Called and lmom on the mobile, that she has not picked up the container for the H.Pylori. She has appt on 09/09/2012.

## 2012-09-01 NOTE — Telephone Encounter (Signed)
Pt still has not completed the H.pylori stool antigen. Can we see if she can do this? Thanks!

## 2012-09-02 ENCOUNTER — Encounter (HOSPITAL_COMMUNITY): Payer: BC Managed Care – PPO | Attending: Oncology

## 2012-09-02 VITALS — BP 144/85 | HR 76 | Temp 97.5°F | Resp 16

## 2012-09-02 DIAGNOSIS — D509 Iron deficiency anemia, unspecified: Secondary | ICD-10-CM | POA: Insufficient documentation

## 2012-09-02 MED ORDER — SODIUM CHLORIDE 0.9 % IJ SOLN
10.0000 mL | INTRAMUSCULAR | Status: DC | PRN
  Administered 2012-09-02: 10 mL
  Filled 2012-09-02: qty 10

## 2012-09-02 MED ORDER — SODIUM CHLORIDE 0.9 % IV SOLN
1020.0000 mg | Freq: Once | INTRAVENOUS | Status: AC
  Administered 2012-09-02: 1020 mg via INTRAVENOUS
  Filled 2012-09-02: qty 34

## 2012-09-02 MED ORDER — SODIUM CHLORIDE 0.9 % IV SOLN
Freq: Once | INTRAVENOUS | Status: AC
  Administered 2012-09-02: 250 mL via INTRAVENOUS

## 2012-09-02 NOTE — Progress Notes (Signed)
Fereheme 1020 mg infused to right AC.  Pt tolerated well.  IV d/c

## 2012-09-09 ENCOUNTER — Ambulatory Visit: Payer: BC Managed Care – PPO | Admitting: Gastroenterology

## 2012-10-04 NOTE — Progress Notes (Signed)
PT NEEDS OPV IN MAY 2014 dX: CROHN'S ILEITIS/ANEMIA  REVIEWED.

## 2012-10-20 ENCOUNTER — Encounter: Payer: Self-pay | Admitting: Gastroenterology

## 2012-10-20 NOTE — Progress Notes (Signed)
Pt is aware of OV on 5/8 at 3 with SF and appt card was mailed

## 2012-10-26 ENCOUNTER — Ambulatory Visit (INDEPENDENT_AMBULATORY_CARE_PROVIDER_SITE_OTHER): Payer: BC Managed Care – PPO | Admitting: Family Medicine

## 2012-10-26 ENCOUNTER — Encounter: Payer: Self-pay | Admitting: Family Medicine

## 2012-10-26 VITALS — BP 124/76 | Temp 97.9°F | Ht 65.0 in | Wt 214.8 lb

## 2012-10-26 DIAGNOSIS — J019 Acute sinusitis, unspecified: Secondary | ICD-10-CM

## 2012-10-26 DIAGNOSIS — J989 Respiratory disorder, unspecified: Secondary | ICD-10-CM

## 2012-10-26 MED ORDER — CEFTRIAXONE SODIUM 1 G IJ SOLR
500.0000 mg | Freq: Once | INTRAMUSCULAR | Status: AC
  Administered 2012-10-26: 500 mg via INTRAMUSCULAR

## 2012-10-26 MED ORDER — AZITHROMYCIN 250 MG PO TABS: ORAL_TABLET | ORAL | Status: DC

## 2012-10-26 NOTE — Progress Notes (Signed)
  Subjective:    Patient ID: [name], female    DOB: 1950-12-18, 62 y.o.   MRN: [id number]  Fever  This is a new problem. The current episode started in the past 7 days. The problem occurs intermittently. The problem has been gradually worsening. Her temperature was unmeasured prior to arrival. Associated symptoms include congestion, coughing, headaches, muscle aches and a sore throat. She has tried acetaminophen for the symptoms. The treatment provided no relief.   Patient relates a few days of cough congestion drainage not feeling good also with some fever over the past couple days has been sick but not as sick she is she relates sinus pressure pain and tenderness no shortness of breath or wheezing no vomiting or diarrhea   Review of Systems  Constitutional: Positive for fever.  HENT: Positive for congestion and sore throat.   Respiratory: Positive for cough.   Neurological: Positive for headaches.       Objective:   Physical Exam Vital signs noted, eardrums normal throat ear tinnitus neck supple mild sinus tenderness lungs are clear heart is regular       Assessment & Plan:  Febrile illness URI along with sinusitis-Rocephin 500 IM as well as Zithromax 5 days call if progressive troubles or if worse.

## 2012-10-26 NOTE — Patient Instructions (Signed)
Rest over next three days If worse follow up

## 2012-12-03 ENCOUNTER — Ambulatory Visit (INDEPENDENT_AMBULATORY_CARE_PROVIDER_SITE_OTHER): Payer: BC Managed Care – PPO | Admitting: Gastroenterology

## 2012-12-03 ENCOUNTER — Encounter: Payer: Self-pay | Admitting: Gastroenterology

## 2012-12-03 VITALS — BP 120/80 | HR 76 | Temp 98.0°F | Ht 64.0 in | Wt 204.8 lb

## 2012-12-03 DIAGNOSIS — K5000 Crohn's disease of small intestine without complications: Secondary | ICD-10-CM

## 2012-12-03 DIAGNOSIS — A048 Other specified bacterial intestinal infections: Secondary | ICD-10-CM

## 2012-12-03 DIAGNOSIS — D509 Iron deficiency anemia, unspecified: Secondary | ICD-10-CM

## 2012-12-03 NOTE — Progress Notes (Signed)
Forwarded to PCP  Forwarded to [employer]

## 2012-12-03 NOTE — Assessment & Plan Note (Signed)
DISEASE MILDLY ACTIVE  CONTINUE PENTASA. CONSIDER CAPSULE ENDOSCOPY/IMURAN IF PT CONTINUES TO NEED IVFE. DISCUSSED BENEFITS V. RISK OF NOT EVALUATING SMALL BOWEL NOW. PT DECLINED ADDITIONAL WORKUP AT THIS TIME. DIS AWAIT LABS FROM DR. NEIJSTROM. OPV IN 6 MOS.

## 2012-12-03 NOTE — Assessment & Plan Note (Signed)
MOST LIKELY DUE TO MILDLY ACTIVE CROHN'S DISEASE   CONTINUE PENTASA. CONSIDER CAPSULE ENDOSCOPY/IMURAN IF PT CONTINUES TO NEED IVFE. DISCUSSED BENEFITS V. RISK OF NOT EVALUATING SMALL BOWEL NOW. PT DECLINED ADDITIONAL WORKUP AT THIS TIME. DIS AWAIT LABS FROM DR. NEIJSTROM. OPV IN 6 MOS.

## 2012-12-03 NOTE — Assessment & Plan Note (Signed)
PT UNABLE TO COMPLETE STUDY DUE TO COSTS.  PT DECLINED ADDITIONAL WORKUP AT THIS TIME.

## 2012-12-03 NOTE — Progress Notes (Signed)
Reminder in epic °

## 2012-12-03 NOTE — Patient Instructions (Signed)
EAT A PALM SIZE SERVING OF MEAT DAILY.  CONTINUE WITH IV IRON INFUSIONS.  CALL ME IF YOU CAN AFFORD TO HAVE ADDITIONAL TESTS.  FOLLOW UP IN 6 MOS.

## 2012-12-03 NOTE — Progress Notes (Signed)
Subjective:    Patient ID: [name], female    DOB: 30-Jan-1951, 62 y.o.   MRN: [id number]  [id number] 1o HEME: NEIJSTROM  HPI WANTS TO MINIMIZE MEDICAL COST AND WOULD LIKELY TO WAIT FOR EVALUATION ANEMIA. STOOLS FINE AND REGULAR. NO ABD PAIN. BMs: 3X/DAY(#4, RARE DIARRHEA). PRILOSEC CONTROL GERD 100%. HAD ONE IVFE IN THE PAST YEAR. PT DENIES FEVER, CHILLS, BRBPR, nausea, vomiting, melena, constipation, abd pain, problems swallowing, heartburn or indigestion.  Past Medical History  Diagnosis Date  . Gastric ulcer   . Hives   . Fibromyalgia   . Obesity   . Endometriosis     HISTORY  LEADING TO HYSTERECTOMY  . Helicobacter pylori gastritis     HISTORY  . Iron (Fe) deficiency anemia 2009    JAN 2012 NL HB & FERRITIN  . Crohn disease JULY 2011 FEDA    PERSISTENT TI ULCERS seen on CE, NO NSAID USE  . Plantar fasciitis    Past Surgical History  Procedure Laterality Date  . Ovarian cyst removal    . Total abdominal hysterectomy w/ bilateral salpingoophorectomy    . Upper gastrointestinal endoscopy  JULY 2011 DIARRHEA/FEDA     Bx-H.PYLORI GASTRITIS, NL DUODENUM  . Ileocolonoscopy  10/14/2007    Multiple 1-3 mm terminal ileum ulcers biopsied  The ulcers seemed to only involve the last 5 cm of her terminal ileum, and 10 cm of her ileum was visualized.  Otherwise the colon  was without polyps, masses, diverticula, or AVMs.  She had a normal retroflexed view of the rectum  . Esophagogastroduodenoscopy  10/14/2007    Esophagus showed 2 cm pink tongue seen extending from the GE junction.  Biopsies obtained via cold forceps.  Otherwise the  esophagus was without erosions, mass, ulceration, or stricture / . Large hiatal hernia pouch with linear erosions, and two 3-6 mmulcers seen in the pouch.  Biopsies obtained via cold forceps to  evaluate for H. pylori gastritis or malignancy   Allergies  Allergen Reactions  . Celebrex (Celecoxib) Hives and Itching  . Sulfonamide Derivatives    Current  Outpatient Prescriptions  Medication Sig Dispense Refill  . acetaminophen (TYLENOL) 325 MG tablet Take 650 mg by mouth as needed.        . hydrOXYzine (ATARAX) 25 MG tablet Take 25 mg by mouth as needed.        . mesalamine (PENTASA) 500 MG CR capsule Take 2 capsules (1,000 mg total) by mouth 4 (four) times daily. USE 2 PO AM SOMETIME 2 IN PM   . omeprazole (PRILOSEC) 20 MG capsule Take 1 capsule (20 mg total) by mouth daily.    .          Review of Systems Jan 2014 NL HFP. PENDING LABS WITH DR. Mariel Sleet    Objective:   Physical Exam  Vitals reviewed. Constitutional: She is oriented to person, place, and time. She appears well-nourished. No distress.  HENT:  Head: Normocephalic and atraumatic.  Mouth/Throat: Oropharynx is clear and moist. No oropharyngeal exudate.  Eyes: Pupils are equal, round, and reactive to light. No scleral icterus.  Neck: Normal range of motion. Neck supple.  Cardiovascular: Normal rate, regular rhythm and normal heart sounds.   Pulmonary/Chest: Effort normal and breath sounds normal. No respiratory distress.  Abdominal: Soft. Bowel sounds are normal. She exhibits no distension. There is no tenderness.  Musculoskeletal: She exhibits no edema.  Lymphadenopathy:    She has no cervical adenopathy.  Neurological: She is alert and oriented  to person, place, and time.  NO  NEW FOCAL DEFICITS   Psychiatric: She has a normal mood and affect.          Assessment & Plan:

## 2012-12-08 ENCOUNTER — Encounter (HOSPITAL_COMMUNITY): Payer: BC Managed Care – PPO | Attending: Oncology

## 2012-12-08 DIAGNOSIS — D509 Iron deficiency anemia, unspecified: Secondary | ICD-10-CM

## 2012-12-08 LAB — CBC
HCT: 39.2 % (ref 36.0–46.0)
Hemoglobin: 13.5 g/dL (ref 12.0–15.0)
MCHC: 34.4 g/dL (ref 30.0–36.0)
WBC: 7.8 10*3/uL (ref 4.0–10.5)

## 2012-12-08 NOTE — Progress Notes (Signed)
[name] presented for labwork. Labs per MD order drawn via Peripheral Line 25 gauge needle inserted in rt ac  Good blood return present. Procedure without incident.  Needle removed intact. Patient tolerated procedure well.   

## 2012-12-09 ENCOUNTER — Encounter (HOSPITAL_BASED_OUTPATIENT_CLINIC_OR_DEPARTMENT_OTHER): Payer: BC Managed Care – PPO | Admitting: Oncology

## 2012-12-09 ENCOUNTER — Encounter (HOSPITAL_COMMUNITY): Payer: Self-pay | Admitting: Oncology

## 2012-12-09 VITALS — BP 126/66 | HR 59 | Temp 97.9°F | Resp 16 | Wt 204.2 lb

## 2012-12-09 DIAGNOSIS — D509 Iron deficiency anemia, unspecified: Secondary | ICD-10-CM

## 2012-12-09 DIAGNOSIS — K5090 Crohn's disease, unspecified, without complications: Secondary | ICD-10-CM

## 2012-12-09 DIAGNOSIS — K909 Intestinal malabsorption, unspecified: Secondary | ICD-10-CM

## 2012-12-09 DIAGNOSIS — D500 Iron deficiency anemia secondary to blood loss (chronic): Secondary | ICD-10-CM

## 2012-12-09 LAB — FERRITIN: Ferritin: 340 ng/mL — ABNORMAL HIGH (ref 10–291)

## 2012-12-09 NOTE — Patient Instructions (Addendum)
.  [facility] Discharge Instructions  RECOMMENDATIONS MADE BY THE CONSULTANT AND ANY TEST RESULTS WILL BE SENT TO YOUR REFERRING PHYSICIAN.  EXAM FINDINGS BY THE PHYSICIAN TODAY AND SIGNS OR SYMPTOMS TO REPORT TO CLINIC OR PRIMARY PHYSICIAN: exam per T. Kefalas PA   SPECIAL INSTRUCTIONS/FOLLOW-UP: Labs in 5 months and return to see Dr. Mariel Sleet in 5 months  Thank you for choosing [facility] to provide your oncology and hematology care.  To afford each patient quality time with our providers, please arrive at least 15 minutes before your scheduled appointment time.  With your help, our goal is to use those 15 minutes to complete the necessary work-up to ensure our physicians have the information they need to help with your evaluation and healthcare recommendations.    Effective January 1st, 2014, we ask that you re-schedule your appointment with our physicians should you arrive 10 or more minutes late for your appointment.  We strive to give you quality time with our providers, and arriving late affects you and other patients whose appointments are after yours.    Again, thank you for choosing [facility].  Our hope is that these requests will decrease the amount of time that you wait before being seen by our physicians.       _____________________________________________________________  Should you have questions after your visit to [facility], please contact our office at [phone] between the hours of 8:30 a.m. and 5:00 p.m.  Voicemails left after 4:30 p.m. will not be returned until the following business day.  For prescription refill requests, have your pharmacy contact our office with your prescription refill request.

## 2012-12-09 NOTE — Progress Notes (Signed)
Sara Punt, MD [address] [place] [address]  ANEMIA, IRON DEFICIENCY - Plan: CBC, Ferritin, Iron and TIBC  CURRENT THERAPY: Feraheme 1020 mg IV given on 09/02/2012  INTERVAL HISTORY: [name] 62 y.o. female returns for  regular  visit for followup of  Iron deficiency anemia with inadequate ability to absorb oral iron as well as excessive iron losses secondary to her underlying Crohn's disease.   I personally reviewed and went over laboratory results with the patient.  Most recent lab work shows a hgb of 13.5 g/dL with a normal WBC and platelet count.  Ferritin is up to 340 after Feraheme infusion on 09/02/2012.  Ferritin was down to 80 on 08/04/2012 show her ferritin demonstrates an appropriate increase after IV Feraheme.  Patient was seen by GI, Dr. Darrick Penna, on 12/03/2012 for evaluation of iron deficiency anemia.  Patient declined work-up for this at this time via capsule endoscopy due to cost.  It is surmised that her iron deficiency is likely secondary to mildly active Crohn's Disease.  She denies any complaints.  She reports that she felt a difference personally when her ferritin dropped to 81 despite a normal Hgb at that time.  She requests we avoid this from happening.  As a result, we will try to keep her ferritin greater than 100 and provide her a Feraheme IV infusion below 100.  She otherwise denies any complaints and hematologic ROS questioning is negative.   Past Medical History  Diagnosis Date  . Gastric ulcer   . Hives   . Fibromyalgia   . Obesity   . Endometriosis     HISTORY  LEADING TO HYSTERECTOMY  . Helicobacter pylori gastritis     HISTORY  . Iron (Fe) deficiency anemia 2009    JAN 2012 NL HB & FERRITIN  . Crohn disease JULY 2011 FEDA    PERSISTENT TI ULCERS seen on CE, NO NSAID USE  . Plantar fasciitis     has HELICOBACTER PYLORI GASTRITIS; ANEMIA, IRON DEFICIENCY; and Crohn's ileitis on her problem list.     is allergic to celebrex and  sulfonamide derivatives.  [name] had no medications administered during this visit.  Past Surgical History  Procedure Laterality Date  . Ovarian cyst removal    . Total abdominal hysterectomy w/ bilateral salpingoophorectomy    . Upper gastrointestinal endoscopy  JULY 2011 DIARRHEA/FEDA     Bx-H.PYLORI GASTRITIS, NL DUODENUM  . Ileocolonoscopy  10/14/2007    Multiple 1-3 mm terminal ileum ulcers biopsied  The ulcers seemed to only involve the last 5 cm of her terminal ileum, and 10 cm of her ileum was visualized.  Otherwise the colon  was without polyps, masses, diverticula, or AVMs.  She had a normal retroflexed view of the rectum  . Esophagogastroduodenoscopy  10/14/2007    Esophagus showed 2 cm pink tongue seen extending from the GE junction.  Biopsies obtained via cold forceps.  Otherwise the  esophagus was without erosions, mass, ulceration, or stricture / . Large hiatal hernia pouch with linear erosions, and two 3-6 mmulcers seen in the pouch.  Biopsies obtained via cold forceps to  evaluate for H. pylori gastritis or malignancy    Denies any headaches, dizziness, double vision, fevers, chills, night sweats, nausea, vomiting, diarrhea, constipation, chest pain, heart palpitations, shortness of breath, blood in stool, black tarry stool, urinary pain, urinary burning, urinary frequency, hematuria.   PHYSICAL EXAMINATION  ECOG PERFORMANCE STATUS: 0 - Asymptomatic  Filed Vitals:  12/09/12 1518  BP: 126/66  Pulse: 59  Temp: 97.9 F (36.6 C)  Resp: 16    GENERAL:alert, no distress, well nourished, well developed, comfortable, cooperative, obese and smiling SKIN: skin color, texture, turgor are normal, no rashes or significant lesions HEAD: Normocephalic, No masses, lesions, tenderness or abnormalities EYES: normal, EOMI, Conjunctiva are pink and non-injected EARS: External ears normal OROPHARYNX:mucous membranes are moist  NECK: supple, no adenopathy, thyroid normal size,  non-tender, without nodularity, no stridor, non-tender, trachea midline LYMPH:  no palpable lymphadenopathy BREAST:not examined LUNGS: clear to auscultation and percussion HEART: regular rate & rhythm, no murmurs, no gallops, S1 normal and S2 normal ABDOMEN:obese and normal bowel sounds BACK: Back symmetric, no curvature., No CVA tenderness EXTREMITIES:less then 2 second capillary refill, no joint deformities, effusion, or inflammation, no edema, no skin discoloration, no clubbing, no cyanosis  NEURO: alert & oriented x 3 with fluent speech, no focal motor/sensory deficits, gait normal    LABORATORY DATA: CBC    Component Value Date/Time   WBC 7.8 12/08/2012 1453   RBC 4.58 12/08/2012 1453   HGB 13.5 12/08/2012 1453   HCT 39.2 12/08/2012 1453   PLT 235 12/08/2012 1453   MCV 85.6 12/08/2012 1453   MCH 29.5 12/08/2012 1453   MCHC 34.4 12/08/2012 1453   RDW 13.9 12/08/2012 1453   LYMPHSABS 2.3 05/08/2010 1622   MONOABS 0.6 05/08/2010 1622   EOSABS 0.1 05/08/2010 1622   BASOSABS 0.0 05/08/2010 1622    Lab Results  Component Value Date   FERRITIN 340* 12/08/2012       ASSESSMENT:  1. Iron deficiency anemia with inadequate ability to absorb oral iron as well as excessive iron losses secondary to her underlying Crohn's disease.  S/P Feraheme 1020 mg IV on 09/02/2012.  Patient declined capsule endoscopy due to cost when seen by Dr. Darrick Penna last on 12/03/12. 2. Crohn's disease on Pentasa  3. H/O fibromyalgia   PLAN:  1. I personally reviewed and went over laboratory results with the patient. 2. Labs in 5 months: CBC, Iron/TIBC, Ferritin 3. Continue to refrain from NSAIDs 4. Follow-up with GI as directed for Crohn's Disease 5. Return in 5 months for follow-up.   All questions were answered. The patient knows to call the clinic with any problems, questions or concerns. We can certainly see the patient much sooner if necessary.  The patient and plan discussed with Glenford Peers, MD and he  is in agreement with the aforementioned.  [name]

## 2013-01-06 ENCOUNTER — Encounter: Payer: Self-pay | Admitting: Nurse Practitioner

## 2013-01-06 ENCOUNTER — Ambulatory Visit (INDEPENDENT_AMBULATORY_CARE_PROVIDER_SITE_OTHER): Payer: BC Managed Care – PPO | Admitting: Nurse Practitioner

## 2013-01-06 VITALS — BP 132/69 | HR 78 | Wt 198.8 lb

## 2013-01-06 DIAGNOSIS — M545 Low back pain: Secondary | ICD-10-CM

## 2013-01-06 MED ORDER — HYDROXYZINE HCL 25 MG PO TABS: 25.0000 mg | ORAL_TABLET | ORAL | Status: DC | PRN

## 2013-01-06 MED ORDER — METHYLPREDNISOLONE ACETATE 40 MG/ML IJ SUSP
40.0000 mg | Freq: Once | INTRAMUSCULAR | Status: AC
  Administered 2013-01-06: 40 mg via INTRAMUSCULAR

## 2013-01-06 MED ORDER — PREDNISONE 20 MG PO TABS: ORAL_TABLET | ORAL | Status: DC

## 2013-01-06 NOTE — Progress Notes (Signed)
Subjective:  Since with complaints of a flareup of her low back pain over the past week. Mainly localized to the low back area, rare radiation down the legs. Pain is been intense to the point of nausea. Works as a Child psychotherapist. Has been working increased hours over the past few weeks. Has lost 16 pounds. Very active lifestyle. Has never had a bone density study.  Objective:   BP 132/69  Pulse 78  Wt 198 lb 12.8 oz (90.175 kg)  BMI 34.11 kg/m2 NAD. Alert, oriented. Lungs clear. Heart regular rhythm. Tenderness noted in the lumbar spinal area. Minimal tenderness to the paraspinal area. SLR negative bilaterally. Patellar reflexes normal bilateral.  Assessment:Low back pain - Plan: methylPREDNISolone acetate (DEPO-MEDROL) injection 40 mg  Plan: Meds ordered this encounter  Medications  . methylPREDNISolone acetate (DEPO-MEDROL) injection 40 mg    Sig:   . predniSONE (DELTASONE) 20 MG tablet    Sig: 3 po qd x 3 d then 2 po qd x 3 d then 1 po qd x 3 d; start 6/12 am    Dispense:  18 tablet    Refill:  0    Order Specific Question:  Supervising Provider    Answer:  Merlyn Albert [[id number]]  . hydrOXYzine (ATARAX/VISTARIL) 25 MG tablet    Sig: Take 1 tablet (25 mg total) by mouth as needed.    Dispense:  30 tablet    Refill:  2    Order Specific Question:  Supervising Provider    Answer:  Merlyn Albert [[id number]]   Patient defers narcotic pain medication at this point. Strongly recommend that she consider a bone density study in the near future, defers it at this point.

## 2013-05-08 ENCOUNTER — Other Ambulatory Visit: Payer: Self-pay | Admitting: Gastroenterology

## 2013-05-10 ENCOUNTER — Other Ambulatory Visit (HOSPITAL_COMMUNITY): Payer: BC Managed Care – PPO

## 2013-05-11 ENCOUNTER — Other Ambulatory Visit (HOSPITAL_COMMUNITY): Payer: BC Managed Care – PPO

## 2013-05-12 ENCOUNTER — Ambulatory Visit (HOSPITAL_COMMUNITY): Payer: BC Managed Care – PPO

## 2013-05-14 ENCOUNTER — Encounter (HOSPITAL_COMMUNITY): Payer: Self-pay

## 2013-05-19 ENCOUNTER — Encounter: Payer: Self-pay | Admitting: Gastroenterology

## 2013-05-19 ENCOUNTER — Encounter (INDEPENDENT_AMBULATORY_CARE_PROVIDER_SITE_OTHER): Payer: Self-pay

## 2013-05-19 ENCOUNTER — Ambulatory Visit (INDEPENDENT_AMBULATORY_CARE_PROVIDER_SITE_OTHER): Payer: BC Managed Care – PPO | Admitting: Gastroenterology

## 2013-05-19 VITALS — BP 138/74 | HR 67 | Temp 97.4°F | Wt 203.4 lb

## 2013-05-19 DIAGNOSIS — D509 Iron deficiency anemia, unspecified: Secondary | ICD-10-CM

## 2013-05-19 DIAGNOSIS — K5000 Crohn's disease of small intestine without complications: Secondary | ICD-10-CM

## 2013-05-19 MED ORDER — OMEPRAZOLE 20 MG PO CPDR: DELAYED_RELEASE_CAPSULE | ORAL | Status: DC

## 2013-05-19 MED ORDER — MESALAMINE ER 500 MG PO CPCR: 1000.0000 mg | ORAL_CAPSULE | Freq: Four times a day (QID) | ORAL | Status: DC

## 2013-05-19 NOTE — Patient Instructions (Signed)
CONTINUE PENTASA.  Use Prilosec 30 minutes prior to your first meal.  FOLLOW A HIGH FIBER/LOW FAT DIET. SEE INFO BELOW.  FOLLOW UP IN 1 YEAR. MERRY CHRISTMAS AND HAPPY NEW YEAR!     High-Fiber Diet A high-fiber diet changes your normal diet to include more whole grains, legumes, fruits, and vegetables. Changes in the diet involve replacing refined carbohydrates with unrefined foods. The calorie level of the diet is essentially unchanged. The Dietary Reference Intake (recommended amount) for adult males is 38 grams per day. For adult females, it is 25 grams per day. Pregnant and lactating women should consume 28 grams of fiber per day. Fiber is the intact part of a plant that is not broken down during digestion. Functional fiber is fiber that has been isolated from the plant to provide a beneficial effect in the body. PURPOSE  Increase stool bulk.   Ease and regulate bowel movements.   Lower cholesterol.  INDICATIONS THAT YOU NEED MORE FIBER  Constipation and hemorrhoids.   Uncomplicated diverticulosis (intestine condition) and irritable bowel syndrome.   Weight management.   As a protective measure against hardening of the arteries (atherosclerosis), diabetes, and cancer.   DO NOT USE WITH:  Acute diverticulitis (intestine infection).   Partial small bowel obstructions.   Complicated diverticular disease involving bleeding, rupture (perforation), or abscess (boil, furuncle).   Presence of autonomic neuropathy (nerve damage) or gastroparesis (stomach cannot empty itself).    GUIDELINES FOR INCREASING FIBER IN THE DIET  Start adding fiber to the diet slowly. A gradual increase of about 5 more grams (2 slices of whole-wheat bread, 2 servings of most fruits or vegetables, or 1 bowl of high-fiber cereal) per day is best. Too rapid an increase in fiber may result in constipation, flatulence, and bloating.   Drink enough water and fluids to keep your urine clear or pale yellow.  Water, juice, or caffeine-free drinks are recommended. Not drinking enough fluid may cause constipation.   Eat a variety of high-fiber foods rather than one type of fiber.   Try to increase your intake of fiber through using high-fiber foods rather than fiber pills or supplements that contain small amounts of fiber.   The goal is to change the types of food eaten. Do not supplement your present diet with high-fiber foods, but replace foods in your present diet.    INCLUDE A VARIETY OF FIBER SOURCES  Replace refined and processed grains with whole grains, canned fruits with fresh fruits, and incorporate other fiber sources. White rice, white breads, and most bakery goods contain little or no fiber.   Brown whole-grain rice, buckwheat oats, and many fruits and vegetables are all good sources of fiber. These include: broccoli, Brussels sprouts, cabbage, cauliflower, beets, sweet potatoes, white potatoes (skin on), carrots, tomatoes, eggplant, squash, berries, fresh fruits, and dried fruits.   Cereals appear to be the richest source of fiber. Cereal fiber is found in whole grains and bran. Bran is the fiber-rich outer coat of cereal grain, which is largely removed in refining. In whole-grain cereals, the bran remains. In breakfast cereals, the largest amount of fiber is found in those with "bran" in their names. The fiber content is sometimes indicated on the label.   You may need to include additional fruits and vegetables each day.   In baking, for 1 cup white flour, you may use the following substitutions:   1 cup whole-wheat flour minus 2 tablespoons.   1/2 cup white flour plus 1/2 cup whole-wheat  flour.   Low-Fat Diet BREADS, CEREALS, PASTA, RICE, DRIED PEAS, AND BEANS These products are high in carbohydrates and most are low in fat. Therefore, they can be increased in the diet as substitutes for fatty foods. They too, however, contain calories and should not be eaten in excess. Cereals  can be eaten for snacks as well as for breakfast.   FRUITS AND VEGETABLES It is good to eat fruits and vegetables. Besides being sources of fiber, both are rich in vitamins and some minerals. They help you get the daily allowances of these nutrients. Fruits and vegetables can be used for snacks and desserts.  MEATS Limit lean meat, chicken, [place], and fish to no more than 6 ounces per day. Beef, Pork, and Lamb Use lean cuts of beef, pork, and lamb. Lean cuts include:  Extra-lean ground beef.  Arm roast.  Sirloin tip.  Center-cut ham.  Round steak.  Loin chops.  Rump roast.  Tenderloin.  Trim all fat off the outside of meats before cooking. It is not necessary to severely decrease the intake of red meat, but lean choices should be made. Lean meat is rich in protein and contains a highly absorbable form of iron. Premenopausal women, in particular, should avoid reducing lean red meat because this could increase the risk for low red blood cells (iron-deficiency anemia).  Chicken and [place] These are good sources of protein. The fat of poultry can be reduced by removing the skin and underlying fat layers before cooking. Chicken and [place] can be substituted for lean red meat in the diet. Poultry should not be fried or covered with high-fat sauces. Fish and Shellfish Fish is a good source of protein. Shellfish contain cholesterol, but they usually are low in saturated fatty acids. The preparation of fish is important. Like chicken and [place], they should not be fried or covered with high-fat sauces. EGGS Egg whites contain no fat or cholesterol. They can be eaten often. Try 1 to 2 egg whites instead of whole eggs in recipes or use egg substitutes that do not contain yolk. MILK AND DAIRY PRODUCTS Use skim or 1% milk instead of 2% or whole milk. Decrease whole milk, natural, and processed cheeses. Use nonfat or low-fat (2%) cottage cheese or low-fat cheeses made from vegetable oils. Choose nonfat  or low-fat (1 to 2%) yogurt. Experiment with evaporated skim milk in recipes that call for heavy cream. Substitute low-fat yogurt or low-fat cottage cheese for sour cream in dips and salad dressings. Have at least 2 servings of low-fat dairy products, such as 2 glasses of skim (or 1%) milk each day to help get your daily calcium intake. FATS AND OILS Reduce the total intake of fats, especially saturated fat. Butterfat, lard, and beef fats are high in saturated fat and cholesterol. These should be avoided as much as possible. Vegetable fats do not contain cholesterol, but certain vegetable fats, such as coconut oil, palm oil, and palm kernel oil are very high in saturated fats. These should be limited. These fats are often used in bakery goods, processed foods, popcorn, oils, and nondairy creamers. Vegetable shortenings and some peanut butters contain hydrogenated oils, which are also saturated fats. Read the labels on these foods and check for saturated vegetable oils. Unsaturated vegetable oils and fats do not raise blood cholesterol. However, they should be limited because they are fats and are high in calories. Total fat should still be limited to 30% of your daily caloric intake. Desirable liquid vegetable oils are  corn oil, cottonseed oil, olive oil, canola oil, safflower oil, soybean oil, and sunflower oil. Peanut oil is not as good, but small amounts are acceptable. Buy a heart-healthy tub margarine that has no partially hydrogenated oils in the ingredients. Mayonnaise and salad dressings often are made from unsaturated fats, but they should also be limited because of their high calorie and fat content. Seeds, nuts, peanut butter, olives, and avocados are high in fat, but the fat is mainly the unsaturated type. These foods should be limited mainly to avoid excess calories and fat. OTHER EATING TIPS Snacks  Most sweets should be limited as snacks. They tend to be rich in calories and fats, and their  caloric content outweighs their nutritional value. Some good choices in snacks are graham crackers, melba toast, soda crackers, bagels (no egg), English muffins, fruits, and vegetables. These snacks are preferable to snack crackers, [place] fries, TORTILLA CHIPS, and POTATO chips. Popcorn should be air-popped or cooked in small amounts of liquid vegetable oil. Desserts Eat fruit, low-fat yogurt, and fruit ices instead of pastries, cake, and cookies. Sherbet, angel food cake, gelatin dessert, frozen low-fat yogurt, or other frozen products that do not contain saturated fat (pure fruit juice bars, frozen ice pops) are also acceptable.  COOKING METHODS Choose those methods that use little or no fat. They include: Poaching.  Braising.  Steaming.  Grilling.  Baking.  Stir-frying.  Broiling.  Microwaving.  Foods can be cooked in a nonstick pan without added fat, or use a nonfat cooking spray in regular cookware. Limit fried foods and avoid frying in saturated fat. Add moisture to lean meats by using water, broth, cooking wines, and other nonfat or low-fat sauces along with the cooking methods mentioned above. Soups and stews should be chilled after cooking. The fat that forms on top after a few hours in the refrigerator should be skimmed off. When preparing meals, avoid using excess salt. Salt can contribute to raising blood pressure in some people.  EATING AWAY FROM HOME Order entres, potatoes, and vegetables without sauces or butter. When meat exceeds the size of a deck of cards (3 to 4 ounces), the rest can be taken home for another meal. Choose vegetable or fruit salads and ask for low-calorie salad dressings to be served on the side. Use dressings sparingly. Limit high-fat toppings, such as bacon, crumbled eggs, cheese, sunflower seeds, and olives. Ask for heart-healthy tub margarine instead of butter.

## 2013-05-19 NOTE — Assessment & Plan Note (Signed)
SX CONTROLLED.  REFILL PENTASA FOR ONE YEAR DISCUSSED NEED FOR A DEX. PT DECLINED DEXA SCAN THIS TIME. WANTS TO MINIMIZE MEDICAL EXPENSES. OPV IN 1 YEAR. WILL NEED CMP/CBC/DEXA SCAN.

## 2013-05-19 NOTE — Progress Notes (Signed)
Subjective:    Patient ID: [name], female    DOB: 07-21-1951, 62 y.o.   MRN: [id number]  Sara Lange, MD  HPI TAKING PENTASA 2 TID. NO FLARES IN PAST 6 MOS. BMs: EVERY DAY. NO PAIN IN HER EYES. HAS BACK PAIN. NO HIP PAIN. NO SORES IN MOUTH, RASH ON LEGS, OR JOINT PAIN. RARE HAND PAIN/SWELLING.  PT DENIES FEVER, CHILLS, BRBPR, nausea, vomiting, melena, diarrhea, constipation, abd pain, problems swallowing, OR heartburn or indigestion.  Past Medical History  Diagnosis Date  . Gastric ulcer   . Hives   . Fibromyalgia   . Obesity   . Endometriosis     HISTORY  LEADING TO HYSTERECTOMY  . Helicobacter pylori gastritis     HISTORY  . Iron (Fe) deficiency anemia 2009    JAN 2012 NL HB & FERRITIN  . Crohn disease JULY 2011 FEDA    PERSISTENT TI ULCERS seen on CE, NO NSAID USE  . Plantar fasciitis    Past Surgical History  Procedure Laterality Date  . Ovarian cyst removal    . Total abdominal hysterectomy w/ bilateral salpingoophorectomy    . Upper gastrointestinal endoscopy  JULY 2011 DIARRHEA/FEDA     Bx-H.PYLORI GASTRITIS, NL DUODENUM  . Ileocolonoscopy  10/14/2007    Multiple 1-3 mm terminal ileum ulcers biopsied  The ulcers seemed to only involve the last 5 cm of her terminal ileum, and 10 cm of her ileum was visualized.  Otherwise the colon  was without polyps, masses, diverticula, or AVMs.  She had a normal retroflexed view of the rectum  . Esophagogastroduodenoscopy  10/14/2007    Esophagus showed 2 cm pink tongue seen extending from the GE junction.  Biopsies obtained via cold forceps.  Otherwise the  esophagus was without erosions, mass, ulceration, or stricture / . Large hiatal hernia pouch with linear erosions, and two 3-6 mmulcers seen in the pouch.  Biopsies obtained via cold forceps to  evaluate for H. pylori gastritis or malignancy    Allergies  Allergen Reactions  . Celebrex [Celecoxib] Hives and Itching  . Sulfonamide Derivatives    Current Outpatient  Prescriptions  Medication Sig Dispense Refill  . acetaminophen (TYLENOL) 325 MG tablet Take 650 mg by mouth as needed.        . hydrOXYzine (ATARAX/VISTARIL) 25 MG tablet Take 1 tablet (25 mg total) by mouth as needed.  30 tablet  2  . mesalamine (PENTASA) 500 MG CR capsule Take 1,000 mg by mouth 4 (four) times daily.      Marland Kitchen omeprazole (PRILOSEC) 20 MG capsule TAKE 1 CAPSULE (20 MG TOTAL) BY MOUTH DAILY.    Marland Kitchen         Review of Systems     Objective:   Physical Exam  Vitals reviewed. Constitutional: She is oriented to person, place, and time. She appears well-nourished. No distress.  HENT:  Head: Normocephalic and atraumatic.  Mouth/Throat: Oropharynx is clear and moist. No oropharyngeal exudate.  Eyes: Pupils are equal, round, and reactive to light. No scleral icterus.  Neck: Normal range of motion. Neck supple.  Cardiovascular: Normal rate, regular rhythm and normal heart sounds.   Pulmonary/Chest: Effort normal and breath sounds normal. No respiratory distress.  Abdominal: Soft. Bowel sounds are normal. She exhibits no distension. There is no tenderness.  Musculoskeletal: She exhibits no edema.  Lymphadenopathy:    She has no cervical adenopathy.  Neurological: She is alert and oriented to person, place, and time.  NO FOCAL DEFICITS  Psychiatric: She has a normal mood and affect.          Assessment & Plan:

## 2013-05-19 NOTE — Assessment & Plan Note (Signed)
SX RESOLVED.  CBC IN ONE YEAR

## 2013-05-20 NOTE — Progress Notes (Signed)
cc'd to pcp 

## 2013-05-25 NOTE — Progress Notes (Signed)
Reminder in Epic 

## 2013-07-20 NOTE — Telephone Encounter (Signed)
Opened in error

## 2013-07-27 ENCOUNTER — Encounter (HOSPITAL_COMMUNITY): Payer: BC Managed Care – PPO | Attending: Hematology and Oncology

## 2013-07-27 DIAGNOSIS — D509 Iron deficiency anemia, unspecified: Secondary | ICD-10-CM | POA: Insufficient documentation

## 2013-07-27 LAB — CBC
Hemoglobin: 13.2 g/dL (ref 12.0–15.0)
MCV: 84.5 fL (ref 78.0–100.0)
Platelets: 256 10*3/uL (ref 150–400)
RBC: 4.76 MIL/uL (ref 3.87–5.11)

## 2013-07-27 LAB — IRON AND TIBC
Iron: 31 ug/dL — ABNORMAL LOW (ref 42–135)
Saturation Ratios: 13 % — ABNORMAL LOW (ref 20–55)
TIBC: 246 ug/dL — ABNORMAL LOW (ref 250–470)
UIBC: 215 ug/dL (ref 125–400)

## 2013-07-27 NOTE — Progress Notes (Signed)
07/27/13 @ 1500 [name] presented for [employer]. Labs per MD order drawn via Peripheral Line 23 gauge needle inserted in right AC  Good blood return present. Procedure without incident.  Needle removed intact. Patient tolerated procedure well.

## 2013-07-28 LAB — FERRITIN: Ferritin: 130 ng/mL (ref 10–291)

## 2013-08-04 ENCOUNTER — Encounter (HOSPITAL_COMMUNITY): Payer: BC Managed Care – PPO | Attending: Hematology and Oncology

## 2013-08-04 ENCOUNTER — Encounter (HOSPITAL_COMMUNITY): Payer: Self-pay

## 2013-08-04 VITALS — BP 137/65 | HR 66 | Temp 97.9°F | Resp 20 | Wt 203.1 lb

## 2013-08-04 DIAGNOSIS — K5000 Crohn's disease of small intestine without complications: Secondary | ICD-10-CM

## 2013-08-04 DIAGNOSIS — D509 Iron deficiency anemia, unspecified: Secondary | ICD-10-CM

## 2013-08-04 NOTE — Patient Instructions (Signed)
[place] Discharge Instructions  RECOMMENDATIONS MADE BY THE CONSULTANT AND ANY TEST RESULTS WILL BE SENT TO YOUR REFERRING PHYSICIAN.  EXAM FINDINGS BY THE PHYSICIAN TODAY AND SIGNS OR SYMPTOMS TO REPORT TO CLINIC OR PRIMARY PHYSICIAN:   Return in 6 months for labs and to see MD  Thank you for choosing [facility] to provide your oncology and hematology care.  To afford each patient quality time with our providers, please arrive at least 15 minutes before your scheduled appointment time.  With your help, our goal is to use those 15 minutes to complete the necessary work-up to ensure our physicians have the information they need to help with your evaluation and healthcare recommendations.    Effective January 1st, 2014, we ask that you re-schedule your appointment with our physicians should you arrive 10 or more minutes late for your appointment.  We strive to give you quality time with our providers, and arriving late affects you and other patients whose appointments are after yours.    Again, thank you for choosing [facility].  Our hope is that these requests will decrease the amount of time that you wait before being seen by our physicians.       _____________________________________________________________  Should you have questions after your visit to [facility], please contact our office at [phone] between the hours of 8:30 a.m. and 5:00 p.m.  Voicemails left after 4:30 p.m. will not be returned until the following business day.  For prescription refill requests, have your pharmacy contact our office with your prescription refill request.

## 2013-08-04 NOTE — Progress Notes (Signed)
[place]  OFFICE PROGRESS NOTE  [name], MD [address] [place] [address]  DIAGNOSIS: Iron deficiency anemia, unspecified - Plan: Transferrin Receptor, Soluable  Crohn's ileitis  Chief Complaint  Patient presents with  . Anemia    Chronic blood loss and anemia of chronic disease    CURRENT THERAPY: Intermittent iron infusion for malabsorption secondary to Crohn's disease as well as chronic blood loss anemia.  INTERVAL HISTORY: [name] 63 y.o. female returns for followup of mixed anemia with contributions from malabsorption and chronic blood loss in the setting of Crohn's disease. She continues on Pentasa 4 tablets daily with excellent control of Crohn's disease. Diarrhea is only intermittent. She denies any melena but does have occasional hematochezia due to hemorrhoids which are sometimes painful. She denies any epistaxis, hematuria, vaginal bleeding, or hemoptysis. She denies any persistent abdominal bloating, nausea, vomiting, dysuria, hematuria, cough, wheezing, joint pain or swelling, skin rash, headache, or seizures. Her last iron infusion was on 09/02/2012.  MEDICAL HISTORY: Past Medical History  Diagnosis Date  . Gastric ulcer   . Hives   . Fibromyalgia   . Obesity   . Endometriosis     HISTORY  LEADING TO HYSTERECTOMY  . Helicobacter pylori gastritis     HISTORY  . Iron (Fe) deficiency anemia 2009    JAN 2012 NL HB & FERRITIN  . Crohn disease JULY 2011 FEDA    PERSISTENT TI ULCERS seen on CE, NO NSAID USE  . Plantar fasciitis     INTERIM HISTORY: has HELICOBACTER PYLORI GASTRITIS; ANEMIA, IRON DEFICIENCY; and Crohn's ileitis on her problem list.    ALLERGIES:  is allergic to celebrex and sulfonamide derivatives.  MEDICATIONS: has a current medication list which includes the following prescription(s): mesalamine, omeprazole, and acetaminophen.  SURGICAL HISTORY:  Past Surgical History    Procedure Laterality Date  . Ovarian cyst removal    . Total abdominal hysterectomy w/ bilateral salpingoophorectomy    . Upper gastrointestinal endoscopy  JULY 2011 DIARRHEA/FEDA     Bx-H.PYLORI GASTRITIS, NL DUODENUM  . Ileocolonoscopy  10/14/2007    Multiple 1-3 mm terminal ileum ulcers biopsied  The ulcers seemed to only involve the last 5 cm of her terminal ileum, and 10 cm of her ileum was visualized.  Otherwise the colon  was without polyps, masses, diverticula, or AVMs.  She had a normal retroflexed view of the rectum  . Esophagogastroduodenoscopy  10/14/2007    Esophagus showed 2 cm pink tongue seen extending from the GE junction.  Biopsies obtained via cold forceps.  Otherwise the  esophagus was without erosions, mass, ulceration, or stricture / . Large hiatal hernia pouch with linear erosions, and two 3-6 mmulcers seen in the pouch.  Biopsies obtained via cold forceps to  evaluate for H. pylori gastritis or malignancy    FAMILY HISTORY: family history includes Cancer in her father; Diabetes in her mother. There is no history of Colon polyps or Colon cancer.  SOCIAL HISTORY:  reports that she has never smoked. She has never used smokeless tobacco. She reports that she does not drink alcohol or use illicit drugs.  REVIEW OF SYSTEMS:  Other than that discussed above is noncontributory.  PHYSICAL EXAMINATION: ECOG PERFORMANCE STATUS: 1 - Symptomatic but completely ambulatory  Blood pressure 137/65, pulse 66, temperature 97.9 F (36.6 C), resp. rate 20, weight 203 lb 1.6 oz (92.126 kg).  GENERAL:alert, no distress and comfortable SKIN:  skin color, texture, turgor are normal, no rashes or significant lesions EYES: PERLA; Conjunctiva are pink and non-injected, sclera clear OROPHARYNX:no exudate, no erythema on lips, buccal mucosa, or tongue.Marland Kitchen No cheilosis. NECK: supple, thyroid normal size, non-tender, without nodularity. No masses CHEST: Normal AP diameter with no breast  masses. LYMPH:  no palpable lymphadenopathy in the cervical, axillary or inguinal LUNGS: clear to auscultation and percussion with normal breathing effort HEART: regular rate & rhythm and no murmurs. ABDOMEN:abdomen soft, non-tender with hyperactive bowel sounds. No rebound tenderness. No guarding or abdominal masses. MUSCULOSKELETAL:no cyanosis of digits and no clubbing. Range of motion normal.  NEURO: alert & oriented x 3 with fluent speech, no focal motor/sensory deficits   LABORATORY DATA: Infusion on 07/27/2013  Component Date Value Range Status  . WBC 07/27/2013 9.2  4.0 - 10.5 K/uL Final  . RBC 07/27/2013 4.76  3.87 - 5.11 MIL/uL Final  . Hemoglobin 07/27/2013 13.2  12.0 - 15.0 g/dL Final  . HCT 07/27/2013 40.2  36.0 - 46.0 % Final  . MCV 07/27/2013 84.5  78.0 - 100.0 fL Final  . MCH 07/27/2013 27.7  26.0 - 34.0 pg Final  . MCHC 07/27/2013 32.8  30.0 - 36.0 g/dL Final  . RDW 07/27/2013 13.8  11.5 - 15.5 % Final  . Platelets 07/27/2013 256  150 - 400 K/uL Final  . Ferritin 07/27/2013 130  10 - 291 ng/mL Final   Performed at [facility]  . Iron 07/27/2013 31* 42 - 135 ug/dL Final  . TIBC 07/27/2013 246* 250 - 470 ug/dL Final  . Saturation Ratios 07/27/2013 13* 20 - 55 % Final  . UIBC 07/27/2013 215  125 - 400 ug/dL Final   Performed at [facility]: No new pathology.  Urinalysis No results found for this basename: colorurine, appearanceur, labspec, phurine, glucoseu, hgbur, bilirubinur, ketonesur, proteinur, urobilinogen, nitrite, leukocytesur    RADIOGRAPHIC STUDIES: No results found.  ASSESSMENT:  #1. Mixed anemia with contributions from chronic blood loss and malabsorption, status post iron infusion if July 2014, stable. #2. Crohn's disease, controlled. #3. Fibromyalgia, on treatment.   PLAN:  #1. Patient was told to call should she develop brisk rectal bleeding or develops severe fatigue. #2. She was told to continue her same  current medications. #3. Followup in 6 months with CBC, ferritin, and soluble transferrin receptor.   All questions were answered. The patient knows to call the clinic with any problems, questions or concerns. We can certainly see the patient much sooner if necessary.   I spent 25 minutes counseling the patient face to face. The total time spent in the appointment was 30 minutes.    Doroteo Bradford, MD 08/04/2013 2:33 PM

## 2013-08-12 LAB — MISCELLANEOUS TEST: Miscellaneous Test: 85236

## 2013-10-07 ENCOUNTER — Encounter: Payer: Self-pay | Admitting: Nurse Practitioner

## 2013-10-07 ENCOUNTER — Ambulatory Visit (INDEPENDENT_AMBULATORY_CARE_PROVIDER_SITE_OTHER): Payer: BC Managed Care – PPO | Admitting: Nurse Practitioner

## 2013-10-07 VITALS — BP 112/70 | Temp 98.5°F | Ht 64.0 in | Wt 212.0 lb

## 2013-10-07 DIAGNOSIS — J069 Acute upper respiratory infection, unspecified: Secondary | ICD-10-CM

## 2013-10-07 MED ORDER — AZITHROMYCIN 250 MG PO TABS
ORAL_TABLET | ORAL | Status: AC
Start: 2013-10-07 — End: 2013-10-12

## 2013-10-07 MED ORDER — CEFTRIAXONE SODIUM 1 G IJ SOLR
500.0000 mg | Freq: Once | INTRAMUSCULAR | Status: AC
  Administered 2013-10-07: 500 mg via INTRAMUSCULAR

## 2013-10-07 MED ORDER — CYCLOBENZAPRINE HCL 10 MG PO TABS: 10.0000 mg | ORAL_TABLET | Freq: Three times a day (TID) | ORAL | Status: DC | PRN

## 2013-10-11 ENCOUNTER — Encounter: Payer: Self-pay | Admitting: Nurse Practitioner

## 2013-10-11 NOTE — Progress Notes (Signed)
Subjective:  Presents complaints of sore throat cough fever that began 5 days ago. Low-grade fever. Occasional nonproductive cough. Runny nose. No wheezing. No shortness of breath. No sore throat. No ear pain. No chest pain. No headache. No edema.  Objective:   BP 112/70  Temp(Src) 98.5 F (36.9 C) (Oral)  Ht 5\' 4"  (1.626 m)  Wt 212 lb (96.163 kg)  BMI 36.37 kg/m2 NAD. Alert, oriented. TMs significant clear effusion, no erythema. Pharynx injected with PND noted. Neck supple with mild soft anterior adenopathy. Lungs clear. Heart regular rate rhythm.  Assessment:Acute upper respiratory infections of unspecified site - Plan: cefTRIAXone (ROCEPHIN) injection 500 mg  Plan: Meds ordered this encounter  Medications  . cefTRIAXone (ROCEPHIN) injection 500 mg    Sig:     Order Specific Question:  Antibiotic Indication:    Answer:  Other Indication (list below)    Order Specific Question:  Other Indication:    Answer:  febrile illness; viral illness  . azithromycin (ZITHROMAX Z-PAK) 250 MG tablet    Sig: Take 2 tablets (500 mg) on  Day 1,  followed by 1 tablet (250 mg) once daily on Days 2 through 5.    Dispense:  6 each    Refill:  0    Order Specific Question:  Supervising Provider    Answer:  Mikey Kirschner [[id number]]  . cyclobenzaprine (FLEXERIL) 10 MG tablet    Sig: Take 1 tablet (10 mg total) by mouth 3 (three) times daily as needed for muscle spasms.    Dispense:  30 tablet    Refill:  0    Order Specific Question:  Supervising Provider    Answer:  Mikey Kirschner [[id number]]   At end of visit patient requested a refill on her muscle relaxant for her chronic back pain. Drowsiness precautions for Flexeril. OTC meds as directed for congestion. Call back if symptoms worsen or persist.

## 2013-12-07 NOTE — Progress Notes (Signed)
Sara Lange, MD [place] [id number]  ANEMIA, IRON DEFICIENCY - Plan: CBC with Differential, Iron and TIBC, Ferritin, Soluble transferrin receptor, CBC with Differential, Iron and TIBC, Ferritin, Soluble transferrin receptor, ferumoxytol (FERAHEME) 1,020 mg in sodium chloride 0.9 % 100 mL IVPB  Crohn's ileitis - Plan: CBC with Differential, Iron and TIBC, Ferritin, Soluble transferrin receptor, CBC with Differential, Iron and TIBC, Ferritin, Soluble transferrin receptor, ferumoxytol (FERAHEME) 1,020 mg in sodium chloride 0.9 % 100 mL IVPB  CURRENT THERAPY: Intermittent iron infusion for malabsorption secondary to Crohn's disease as well as chronic blood loss anemia.   INTERVAL HISTORY: [name] 63 y.o. female returns for  regular  visit for followup of mixed anemia with contributions from malabsorption and chronic blood loss in the setting of Crohn's disease.  I personally reviewed and went over laboratory results with the patient.  The results are noted within this dictation.  Her ferritin is noted to be below 100 and therefore she qualifies for IV Feraheme.  More importantly, she is symptomatic with increased fatigue and tiredness.  She has tolerated IV Feraheme in the past and her last infusion was >12 months ago in Feb 2014.  Her hemoglobin is down minimally, but WNL.  She is happy to receive Feraheme today.  She notes that she felt much improved following her last infusion.  Hematologically, she otherwise denies any complaints and ROS questioning is negative.  Past Medical History  Diagnosis Date  . Gastric ulcer   . Hives   . Fibromyalgia   . Obesity   . Endometriosis     HISTORY  LEADING TO HYSTERECTOMY  . Helicobacter pylori gastritis     HISTORY  . Iron (Fe) deficiency anemia 2009    JAN 2012 NL HB & FERRITIN  . Crohn disease JULY 2011 FEDA    PERSISTENT TI ULCERS seen on CE, NO NSAID USE  . Plantar fasciitis     has HELICOBACTER PYLORI  GASTRITIS; ANEMIA, IRON DEFICIENCY; and Crohn's ileitis on her problem list.     is allergic to celebrex and sulfonamide derivatives.  Ms. Lupu does not currently have medications on file.  Past Surgical History  Procedure Laterality Date  . Ovarian cyst removal    . Total abdominal hysterectomy w/ bilateral salpingoophorectomy    . Upper gastrointestinal endoscopy  JULY 2011 DIARRHEA/FEDA     Bx-H.PYLORI GASTRITIS, NL DUODENUM  . Ileocolonoscopy  10/14/2007    Multiple 1-3 mm terminal ileum ulcers biopsied  The ulcers seemed to only involve the last 5 cm of her terminal ileum, and 10 cm of her ileum was visualized.  Otherwise the colon  was without polyps, masses, diverticula, or AVMs.  She had a normal retroflexed view of the rectum  . Esophagogastroduodenoscopy  10/14/2007    Esophagus showed 2 cm pink tongue seen extending from the GE junction.  Biopsies obtained via cold forceps.  Otherwise the  esophagus was without erosions, mass, ulceration, or stricture / . Large hiatal hernia pouch with linear erosions, and two 3-6 mmulcers seen in the pouch.  Biopsies obtained via cold forceps to  evaluate for H. pylori gastritis or malignancy    Denies any headaches, dizziness, double vision, fevers, chills, night sweats, nausea, vomiting, diarrhea, constipation, chest pain, heart palpitations, shortness of breath, blood in stool, black tarry stool, urinary pain, urinary burning, urinary frequency, hematuria.   PHYSICAL EXAMINATION  ECOG PERFORMANCE STATUS: 1 - Symptomatic but completely ambulatory  Filed Vitals:  12/09/13 0930  BP: 130/80  Pulse: 66  Temp: 97.9 F (36.6 C)  Resp: 18    GENERAL:alert, healthy, no distress, well nourished, well developed, comfortable, cooperative, obese and smiling SKIN: skin color, texture, turgor are normal, no rashes or significant lesions HEAD: Normocephalic, No masses, lesions, tenderness or abnormalities EYES: normal, PERRLA, EOMI, Conjunctiva  are pink and non-injected EARS: External ears normal OROPHARYNX:mucous membranes are moist  NECK: supple, no adenopathy, thyroid normal size, non-tender, without nodularity, no stridor, non-tender, trachea midline LYMPH:  no palpable lymphadenopathy BREAST:not examined LUNGS: clear to auscultation  HEART: regular rate & rhythm, no murmurs and no gallops ABDOMEN:obese BACK: Back symmetric, no curvature. EXTREMITIES:less then 2 second capillary refill, no skin discoloration, no clubbing, no cyanosis  NEURO: alert & oriented x 3 with fluent speech, no focal motor/sensory deficits, gait normal    LABORATORY DATA: CBC    Component Value Date/Time   WBC 6.9 12/08/2013 1049   RBC 4.44 12/08/2013 1049   HGB 12.6 12/08/2013 1049   HCT 37.4 12/08/2013 1049   PLT 261 12/08/2013 1049   MCV 84.2 12/08/2013 1049   MCH 28.4 12/08/2013 1049   MCHC 33.7 12/08/2013 1049   RDW 13.2 12/08/2013 1049   LYMPHSABS 2.0 12/08/2013 1049   MONOABS 0.6 12/08/2013 1049   EOSABS 0.1 12/08/2013 1049   BASOSABS 0.0 12/08/2013 1049      Chemistry      Component Value Date/Time   NA 139 08/05/2012 1509   K 3.4* 08/05/2012 1509   CL 106 08/05/2012 1509   CO2 19 08/05/2012 1509   BUN 14 08/05/2012 1509   CREATININE 0.69 08/05/2012 1509      Component Value Date/Time   CALCIUM 8.9 08/05/2012 1509   ALKPHOS 94 08/05/2012 1509   AST 21 08/05/2012 1509   ALT 17 08/05/2012 1509   BILITOT 0.3 08/05/2012 1509     Lab Results  Component Value Date   IRON 31* 07/27/2013   TIBC 246* 07/27/2013   FERRITIN 88 12/08/2013       ASSESSMENT:  1. Mixed anemia with contributions from chronic blood loss and malabsorption, requiring intermittent IV Feraheme 2. Crohn's disease, controlled 3. Fibromyalgia, on treatment  Patient Active Problem List   Diagnosis Date Noted  . Crohn's ileitis 01/17/2011  . HELICOBACTER PYLORI GASTRITIS 04/10/2010  . ANEMIA, IRON DEFICIENCY 04/10/2010     PLAN:  1. I personally reviewed and went over  laboratory results with the patient.  The results are noted within this dictation. 2. Labs in 3 and 6 months: CBC diff, Ferritin, Iron/TIBC, soluble transferrin 3. IV Feraheme 1020 mg in the month of May 2015.  Order placed and signed and held. 4. Return in 6 months for follow-up   THERAPY PLAN:  We will continue to monitor labs and provide iron support when labs dictate.  I think a reasonable goal is to maintain a ferritin of 100 or greater.  She feels better when her ferritin is greater than 100  All questions were answered. The patient knows to call the clinic with any problems, questions or concerns. We can certainly see the patient much sooner if necessary.  Patient and plan discussed with Dr. Farrel Gobble and he is in agreement with the aforementioned.   [facility] 12/09/2013

## 2013-12-08 ENCOUNTER — Encounter (HOSPITAL_COMMUNITY): Payer: BC Managed Care – PPO | Attending: Hematology and Oncology

## 2013-12-08 DIAGNOSIS — IMO0001 Reserved for inherently not codable concepts without codable children: Secondary | ICD-10-CM | POA: Insufficient documentation

## 2013-12-08 DIAGNOSIS — D509 Iron deficiency anemia, unspecified: Secondary | ICD-10-CM | POA: Insufficient documentation

## 2013-12-08 DIAGNOSIS — D500 Iron deficiency anemia secondary to blood loss (chronic): Secondary | ICD-10-CM | POA: Insufficient documentation

## 2013-12-08 DIAGNOSIS — Z09 Encounter for follow-up examination after completed treatment for conditions other than malignant neoplasm: Secondary | ICD-10-CM | POA: Insufficient documentation

## 2013-12-08 DIAGNOSIS — K909 Intestinal malabsorption, unspecified: Secondary | ICD-10-CM | POA: Insufficient documentation

## 2013-12-08 DIAGNOSIS — K5000 Crohn's disease of small intestine without complications: Secondary | ICD-10-CM | POA: Insufficient documentation

## 2013-12-08 DIAGNOSIS — E669 Obesity, unspecified: Secondary | ICD-10-CM | POA: Insufficient documentation

## 2013-12-08 LAB — CBC WITH DIFFERENTIAL/PLATELET
BASOS ABS: 0 10*3/uL (ref 0.0–0.1)
Basophils Relative: 0 % (ref 0–1)
Eosinophils Absolute: 0.1 10*3/uL (ref 0.0–0.7)
Eosinophils Relative: 2 % (ref 0–5)
HCT: 37.4 % (ref 36.0–46.0)
Hemoglobin: 12.6 g/dL (ref 12.0–15.0)
LYMPHS PCT: 29 % (ref 12–46)
Lymphs Abs: 2 10*3/uL (ref 0.7–4.0)
MCH: 28.4 pg (ref 26.0–34.0)
MCHC: 33.7 g/dL (ref 30.0–36.0)
MCV: 84.2 fL (ref 78.0–100.0)
Monocytes Absolute: 0.6 10*3/uL (ref 0.1–1.0)
Monocytes Relative: 8 % (ref 3–12)
Neutro Abs: 4.2 10*3/uL (ref 1.7–7.7)
Neutrophils Relative %: 61 % (ref 43–77)
Platelets: 261 10*3/uL (ref 150–400)
RBC: 4.44 MIL/uL (ref 3.87–5.11)
RDW: 13.2 % (ref 11.5–15.5)
WBC: 6.9 10*3/uL (ref 4.0–10.5)

## 2013-12-08 LAB — FERRITIN: Ferritin: 88 ng/mL (ref 10–291)

## 2013-12-09 ENCOUNTER — Encounter (HOSPITAL_BASED_OUTPATIENT_CLINIC_OR_DEPARTMENT_OTHER): Payer: BC Managed Care – PPO | Admitting: Oncology

## 2013-12-09 ENCOUNTER — Encounter (HOSPITAL_COMMUNITY): Payer: Self-pay | Admitting: Oncology

## 2013-12-09 ENCOUNTER — Encounter (HOSPITAL_BASED_OUTPATIENT_CLINIC_OR_DEPARTMENT_OTHER): Payer: BC Managed Care – PPO

## 2013-12-09 VITALS — BP 130/80 | HR 66 | Temp 97.9°F | Resp 18 | Wt 207.5 lb

## 2013-12-09 VITALS — BP 137/51 | HR 66 | Temp 97.7°F | Resp 18

## 2013-12-09 DIAGNOSIS — K5000 Crohn's disease of small intestine without complications: Secondary | ICD-10-CM

## 2013-12-09 DIAGNOSIS — D518 Other vitamin B12 deficiency anemias: Secondary | ICD-10-CM

## 2013-12-09 DIAGNOSIS — D509 Iron deficiency anemia, unspecified: Secondary | ICD-10-CM

## 2013-12-09 DIAGNOSIS — R5383 Other fatigue: Secondary | ICD-10-CM

## 2013-12-09 DIAGNOSIS — D500 Iron deficiency anemia secondary to blood loss (chronic): Secondary | ICD-10-CM

## 2013-12-09 DIAGNOSIS — IMO0001 Reserved for inherently not codable concepts without codable children: Secondary | ICD-10-CM

## 2013-12-09 DIAGNOSIS — M797 Fibromyalgia: Secondary | ICD-10-CM

## 2013-12-09 DIAGNOSIS — R5381 Other malaise: Secondary | ICD-10-CM

## 2013-12-09 DIAGNOSIS — K5090 Crohn's disease, unspecified, without complications: Secondary | ICD-10-CM

## 2013-12-09 DIAGNOSIS — D649 Anemia, unspecified: Secondary | ICD-10-CM

## 2013-12-09 HISTORY — DX: Fibromyalgia: M79.7

## 2013-12-09 MED ORDER — SODIUM CHLORIDE 0.9 % IJ SOLN
10.0000 mL | INTRAMUSCULAR | Status: DC | PRN
  Administered 2013-12-09: 10 mL via INTRAVENOUS

## 2013-12-09 MED ORDER — SODIUM CHLORIDE 0.9 % IV SOLN
INTRAVENOUS | Status: DC
  Administered 2013-12-09: 10:00:00 via INTRAVENOUS

## 2013-12-09 MED ORDER — SODIUM CHLORIDE 0.9 % IV SOLN
1020.0000 mg | Freq: Once | INTRAVENOUS | Status: AC
  Administered 2013-12-09: 1020 mg via INTRAVENOUS
  Filled 2013-12-09: qty 34

## 2013-12-09 NOTE — Patient Instructions (Signed)
[facility] Discharge Instructions  RECOMMENDATIONS MADE BY THE CONSULTANT AND ANY TEST RESULTS WILL BE SENT TO YOUR REFERRING PHYSICIAN.  EXAM FINDINGS BY THE PHYSICIAN TODAY AND SIGNS OR SYMPTOMS TO REPORT TO CLINIC OR PRIMARY PHYSICIAN: Exam and findings as discussed by Robynn Pane, PA-C.  Will give you feraheme today and will check labs in 3 and 6 months.  Report increased fatigue or shortness of breath.  MEDICATIONS PRESCRIBED:  none  INSTRUCTIONS/FOLLOW-UP: Labs in 3 and 6 months and follow-up in 6 months.  Thank you for choosing [facility] to provide your oncology and hematology care.  To afford each patient quality time with our providers, please arrive at least 15 minutes before your scheduled appointment time.  With your help, our goal is to use those 15 minutes to complete the necessary work-up to ensure our physicians have the information they need to help with your evaluation and healthcare recommendations.    Effective January 1st, 2014, we ask that you re-schedule your appointment with our physicians should you arrive 10 or more minutes late for your appointment.  We strive to give you quality time with our providers, and arriving late affects you and other patients whose appointments are after yours.    Again, thank you for choosing [facility].  Our hope is that these requests will decrease the amount of time that you wait before being seen by our physicians.       _____________________________________________________________  Should you have questions after your visit to [facility], please contact our office at [phone] between the hours of 8:30 a.m. and 5:00 p.m.  Voicemails left after 4:30 p.m. will not be returned until the following business day.  For prescription refill requests, have your pharmacy contact our office with your prescription refill request.

## 2013-12-09 NOTE — Progress Notes (Signed)
Tolerated well

## 2013-12-14 ENCOUNTER — Ambulatory Visit (INDEPENDENT_AMBULATORY_CARE_PROVIDER_SITE_OTHER): Payer: BC Managed Care – PPO | Admitting: Family Medicine

## 2013-12-14 ENCOUNTER — Encounter: Payer: Self-pay | Admitting: Family Medicine

## 2013-12-14 VITALS — BP 110/64 | Temp 98.5°F | Ht 64.0 in | Wt 209.0 lb

## 2013-12-14 DIAGNOSIS — L0390 Cellulitis, unspecified: Secondary | ICD-10-CM

## 2013-12-14 DIAGNOSIS — L0291 Cutaneous abscess, unspecified: Secondary | ICD-10-CM

## 2013-12-14 DIAGNOSIS — H00019 Hordeolum externum unspecified eye, unspecified eyelid: Secondary | ICD-10-CM

## 2013-12-14 MED ORDER — CEFTRIAXONE SODIUM 1 G IJ SOLR
500.0000 mg | Freq: Once | INTRAMUSCULAR | Status: AC
  Administered 2013-12-14: 500 mg via INTRAMUSCULAR

## 2013-12-14 MED ORDER — AMOXICILLIN-POT CLAVULANATE 875-125 MG PO TABS: 1.0000 | ORAL_TABLET | Freq: Two times a day (BID) | ORAL | Status: DC

## 2013-12-14 NOTE — Progress Notes (Signed)
   Subjective:    Patient ID: [name], female    DOB: [birth year], 63 y.o.   MRN: [id number]  Cough This is a new problem. The current episode started 1 to 4 weeks ago. Associated symptoms include headaches and rhinorrhea. Pertinent negatives include no chest pain, ear pain, fever, shortness of breath or wheezing. Associated symptoms comments: Swelling around right eye, itchy eyes, . Treatments tried: tylenol. The treatment provided no relief.   Patient does state that the lower eyelid on the right side was red tender and painful over the past couple days. Dr Jorja Loa  Review of Systems  Constitutional: Negative for fever and activity change.  HENT: Positive for congestion and rhinorrhea. Negative for ear pain.   Eyes: Positive for discharge and itching.  Respiratory: Positive for cough. Negative for shortness of breath and wheezing.   Cardiovascular: Negative for chest pain.  Neurological: Positive for headaches.       Objective:   Physical Exam  Nursing note and vitals reviewed. Constitutional: She appears well-developed.  HENT:  Head: Normocephalic.  Nose: Nose normal.  Mouth/Throat: Oropharynx is clear and moist. No oropharyngeal exudate.  Neck: Neck supple.  Cardiovascular: Normal rate and normal heart sounds.   No murmur heard. Pulmonary/Chest: Effort normal and breath sounds normal. She has no wheezes.  Lymphadenopathy:    She has no cervical adenopathy.  Skin: Skin is warm and dry.   On physical exam she has low but a cellulitis of the lower eyelid there is no sign of any type of orbital cellulitis in regards to abscess behind the eyeball. There is a blocked tear duct along the lower eyelid glands.       Assessment & Plan:  Eyelid infection shot of antibiotics antibiotics prescribed warm compresses if not improved in the next 48 hours notify us and we can refer to ophthalmology needed  Viral URI should gradually get better as well

## 2013-12-16 ENCOUNTER — Telehealth: Payer: Self-pay | Admitting: Family Medicine

## 2013-12-16 MED ORDER — BENZONATATE 100 MG PO CAPS: 100.0000 mg | ORAL_CAPSULE | Freq: Three times a day (TID) | ORAL | Status: DC | PRN

## 2013-12-16 NOTE — Telephone Encounter (Signed)
Options, Tessalon 1 tid prn ( non drowsy ) or hycodan for night use. Remember no cough med will totally remove the cough, if worse with time then needs f/u ov

## 2013-12-16 NOTE — Telephone Encounter (Signed)
Patient said she was seen on 12/14/13 and still has a hacking cough. She is hoping we can call her something in.  [facility].

## 2013-12-16 NOTE — Telephone Encounter (Signed)
[facility] [place] per patient request.

## 2013-12-17 ENCOUNTER — Telehealth: Payer: Self-pay | Admitting: Family Medicine

## 2013-12-17 MED ORDER — AZITHROMYCIN 250 MG PO TABS: ORAL_TABLET | ORAL | Status: DC

## 2013-12-17 NOTE — Telephone Encounter (Signed)
Medication sent to pharmacy. Patient was notified.  

## 2013-12-17 NOTE — Telephone Encounter (Signed)
May send him Z-Pak

## 2013-12-17 NOTE — Telephone Encounter (Signed)
When I saw her the other day I felt that she had a viral upper rest real numbness along with a infection of the lower eyelids she was given a shot of antibiotics and a prescription of antibiotics. Palate is the lower eyelid? In regards to the congestion the cough in what way he is if not better or worse? Shortness of breath? Progressively worse or just not feeling better? Certainly this is going to take several days to get better. I am opened toward trying a different medication but I need more information thank you

## 2013-12-17 NOTE — Telephone Encounter (Signed)
Seen on 5/19. Was given Rochepin inj and prescribed Augmentin.

## 2013-12-17 NOTE — Telephone Encounter (Signed)
Patient states that her eyelid is better. She states that she is still coughing, wheezing, and has a fever. She wants a Zpak prescribed because this always works for her.

## 2013-12-17 NOTE — Telephone Encounter (Signed)
Pt states she's no better from Tuesday, cough not helping, now with fever and sore throat, can we call in antibiotics?  Please [facility]

## 2013-12-17 NOTE — Telephone Encounter (Signed)
Left message on voicemail to return call.

## 2013-12-24 NOTE — Progress Notes (Signed)
Labs drawn

## 2013-12-27 ENCOUNTER — Other Ambulatory Visit (HOSPITAL_COMMUNITY): Payer: BC Managed Care – PPO

## 2013-12-28 ENCOUNTER — Other Ambulatory Visit (HOSPITAL_COMMUNITY): Payer: BC Managed Care – PPO

## 2013-12-29 ENCOUNTER — Other Ambulatory Visit (HOSPITAL_COMMUNITY): Payer: BC Managed Care – PPO

## 2014-01-03 ENCOUNTER — Ambulatory Visit (HOSPITAL_COMMUNITY): Payer: BC Managed Care – PPO

## 2014-01-04 ENCOUNTER — Ambulatory Visit (HOSPITAL_COMMUNITY): Payer: BC Managed Care – PPO

## 2014-01-10 ENCOUNTER — Other Ambulatory Visit (HOSPITAL_COMMUNITY): Payer: BC Managed Care – PPO

## 2014-03-01 ENCOUNTER — Encounter (HOSPITAL_COMMUNITY): Payer: BC Managed Care – PPO | Attending: Hematology and Oncology

## 2014-03-01 DIAGNOSIS — D509 Iron deficiency anemia, unspecified: Secondary | ICD-10-CM | POA: Diagnosis not present

## 2014-03-01 DIAGNOSIS — K5000 Crohn's disease of small intestine without complications: Secondary | ICD-10-CM | POA: Diagnosis present

## 2014-03-01 DIAGNOSIS — D500 Iron deficiency anemia secondary to blood loss (chronic): Secondary | ICD-10-CM

## 2014-03-01 LAB — CBC WITH DIFFERENTIAL/PLATELET
BASOS ABS: 0 10*3/uL (ref 0.0–0.1)
Basophils Relative: 0 % (ref 0–1)
EOS PCT: 1 % (ref 0–5)
Eosinophils Absolute: 0.1 10*3/uL (ref 0.0–0.7)
HEMATOCRIT: 38.9 % (ref 36.0–46.0)
Hemoglobin: 13.2 g/dL (ref 12.0–15.0)
LYMPHS PCT: 28 % (ref 12–46)
Lymphs Abs: 2.2 10*3/uL (ref 0.7–4.0)
MCH: 29.4 pg (ref 26.0–34.0)
MCHC: 33.9 g/dL (ref 30.0–36.0)
MCV: 86.6 fL (ref 78.0–100.0)
MONOS PCT: 7 % (ref 3–12)
Monocytes Absolute: 0.5 10*3/uL (ref 0.1–1.0)
Neutro Abs: 5.1 10*3/uL (ref 1.7–7.7)
Neutrophils Relative %: 64 % (ref 43–77)
Platelets: 249 10*3/uL (ref 150–400)
RBC: 4.49 MIL/uL (ref 3.87–5.11)
RDW: 14.2 % (ref 11.5–15.5)
WBC: 7.9 10*3/uL (ref 4.0–10.5)

## 2014-03-01 LAB — IRON AND TIBC
Iron: 37 ug/dL — ABNORMAL LOW (ref 42–135)
Saturation Ratios: 14 % — ABNORMAL LOW (ref 20–55)
TIBC: 264 ug/dL (ref 250–470)
UIBC: 227 ug/dL (ref 125–400)

## 2014-03-01 NOTE — Progress Notes (Signed)
[name]'s reason for visit today is for labs as scheduled per MD orders.  Venipuncture performed with a 23 gauge butterfly needle to L Antecubital.  [name] tolerated procedure well and without incident; questions were answered and patient was discharged.

## 2014-03-02 LAB — FERRITIN: FERRITIN: 242 ng/mL (ref 10–291)

## 2014-03-04 ENCOUNTER — Ambulatory Visit (INDEPENDENT_AMBULATORY_CARE_PROVIDER_SITE_OTHER): Payer: BC Managed Care – PPO | Admitting: Nurse Practitioner

## 2014-03-04 ENCOUNTER — Encounter: Payer: Self-pay | Admitting: Nurse Practitioner

## 2014-03-04 VITALS — BP 120/80 | Temp 97.7°F | Ht 64.0 in | Wt 199.0 lb

## 2014-03-04 DIAGNOSIS — Z13220 Encounter for screening for lipoid disorders: Secondary | ICD-10-CM

## 2014-03-04 DIAGNOSIS — R5383 Other fatigue: Secondary | ICD-10-CM

## 2014-03-04 DIAGNOSIS — G44209 Tension-type headache, unspecified, not intractable: Secondary | ICD-10-CM

## 2014-03-04 DIAGNOSIS — R5381 Other malaise: Secondary | ICD-10-CM

## 2014-03-04 DIAGNOSIS — Z79899 Other long term (current) drug therapy: Secondary | ICD-10-CM

## 2014-03-04 LAB — SOLUBLE TRANSFERRIN RECEPTOR: TRANSFERRIN RECEPTOR, SOLUBLE: 0.9 mg/L (ref 0.76–1.76)

## 2014-03-04 MED ORDER — CYCLOBENZAPRINE HCL 5 MG PO TABS: ORAL_TABLET | ORAL | Status: DC

## 2014-03-08 ENCOUNTER — Encounter: Payer: Self-pay | Admitting: Nurse Practitioner

## 2014-03-08 NOTE — Progress Notes (Signed)
Subjective:  Presents for complaints of 2 separate episodes of shakiness and sweating. The first occurred on 8/3 felt mildly achy sweaty and shaking lasted approximately 1 minute. Had a second milder episode the next day. None since. Also dull achy headache pain around the left parietal area, better today. No fever. No nausea vomiting. Episodes occurred after eating. Denies any missed meals. Has a history of anemia this has been stable lately. No cough runny nose sore throat or ear pain. No syncope. No visual changes. No difficulty speaking or swallowing. No numbness or weakness of the face arms or legs.  Objective:   BP 120/80  Temp(Src) 97.7 F (36.5 C)  Ht 5\' 4"  (1.626 m)  Wt 199 lb (90.266 kg)  BMI 34.14 kg/m2 NAD. Alert, oriented. TMs minimal clear effusion, no erythema. Pharynx clear. Neck supple with mild soft anterior adenopathy. Thyroid nontender, no masses or goiters noted. Lungs clear. Heart regular rate rhythm. No murmur or gallop noted. Extremely tight tender muscles noted in the lateral neck area particularly on the left side going up into the left scalp area. Also some tenderness down into the upper back/rhomboid area.  Assessment: Muscle contraction headache  Encounter for long-term (current) use of other medications - Plan: Hepatic function panel  Other malaise and fatigue - Plan: TSH  Screening, lipid - Plan: Lipid panel, Basic metabolic panel  Plan:  Meds ordered this encounter  Medications  . cyclobenzaprine (FLEXERIL) 5 MG tablet    Sig: 1/2 - 1 po qhs prn muscle spasms    Dispense:  30 tablet    Refill:  0    Order Specific Question:  Supervising Provider    Answer:  Mikey Kirschner [[id number]]   Has had significant drowsiness with Flexeril the past, cut 10 mg tabs into fourths. Switch to Flexeril 5 mg. Ice/heat to the neck area. Gentle stretching exercises. Has been under more stress with the, discussed importance of stress reduction. Reviewed warning signs. Patient  to call back if symptoms recur.

## 2014-03-09 LAB — HEPATIC FUNCTION PANEL
ALK PHOS: 92 U/L (ref 39–117)
ALT: 18 U/L (ref 0–35)
AST: 14 U/L (ref 0–37)
Albumin: 4 g/dL (ref 3.5–5.2)
BILIRUBIN INDIRECT: 0.4 mg/dL (ref 0.2–1.2)
Bilirubin, Direct: 0.2 mg/dL (ref 0.0–0.3)
Total Bilirubin: 0.6 mg/dL (ref 0.2–1.2)
Total Protein: 6.9 g/dL (ref 6.0–8.3)

## 2014-03-09 LAB — BASIC METABOLIC PANEL
BUN: 15 mg/dL (ref 6–23)
CO2: 26 mEq/L (ref 19–32)
CREATININE: 0.57 mg/dL (ref 0.50–1.10)
Calcium: 9.3 mg/dL (ref 8.4–10.5)
Chloride: 106 mEq/L (ref 96–112)
GLUCOSE: 111 mg/dL — AB (ref 70–99)
Potassium: 4.3 mEq/L (ref 3.5–5.3)
Sodium: 140 mEq/L (ref 135–145)

## 2014-03-09 LAB — LIPID PANEL
CHOLESTEROL: 182 mg/dL (ref 0–200)
HDL: 45 mg/dL (ref >39)
LDL Cholesterol: 117 mg/dL — ABNORMAL HIGH (ref 0–99)
TRIGLYCERIDES: 102 mg/dL (ref <150)
Total CHOL/HDL Ratio: 4 Ratio
VLDL: 20 mg/dL (ref 0–40)

## 2014-03-09 LAB — TSH: TSH: 0.765 u[IU]/mL (ref 0.350–4.500)

## 2014-03-23 ENCOUNTER — Ambulatory Visit (INDEPENDENT_AMBULATORY_CARE_PROVIDER_SITE_OTHER): Payer: BC Managed Care – PPO | Admitting: Nurse Practitioner

## 2014-03-23 ENCOUNTER — Encounter: Payer: Self-pay | Admitting: Nurse Practitioner

## 2014-03-23 VITALS — BP 122/84 | Ht 64.0 in | Wt 218.0 lb

## 2014-03-23 DIAGNOSIS — R7301 Impaired fasting glucose: Secondary | ICD-10-CM

## 2014-03-23 LAB — POCT GLYCOSYLATED HEMOGLOBIN (HGB A1C): HEMOGLOBIN A1C: 5.2

## 2014-03-23 NOTE — Patient Instructions (Signed)
[email or web address]

## 2014-03-29 ENCOUNTER — Encounter: Payer: Self-pay | Admitting: Nurse Practitioner

## 2014-03-29 DIAGNOSIS — R7301 Impaired fasting glucose: Secondary | ICD-10-CM | POA: Insufficient documentation

## 2014-03-29 NOTE — Progress Notes (Signed)
Subjective:  Presents with her husband to review her most recent lab work.  Objective:   BP 122/84  Ht 5' 4"  (1.626 m)  Wt 218 lb (98.884 kg)  BMI 37.40 kg/m2 Reviewed lab work dated 03/09/2014 with patient. Has had 2 fasting sugars above 100 on 2 separate occasions. Significant central obesity noted. Results for orders placed in visit on 03/23/14  POCT GLYCOSYLATED HEMOGLOBIN (HGB A1C)      Result Value Ref Range   Hemoglobin A1C 5.2       Assessment: Impaired fasting glucose - Plan: POCT glycosylated hemoglobin (Hb A1C)  Plan: Recommend weight loss particularly around the abdominal area. Regular activity. Reduce sugar and simple carbs in her diet. No medication at this time since her hemoglobin A1c is normal. Repeat at next visit. Return in about 6 months (around 09/23/2014).

## 2014-04-07 ENCOUNTER — Encounter: Payer: Self-pay | Admitting: Gastroenterology

## 2014-04-28 ENCOUNTER — Telehealth: Payer: Self-pay | Admitting: Gastroenterology

## 2014-04-28 NOTE — Telephone Encounter (Signed)
SCRIPT WILL BE RUNNING OUT PRIOR TO APPT AND TOLD PATIENT JUST TO HAVE PHARMACY CALL us

## 2014-04-28 NOTE — Telephone Encounter (Signed)
Noted  

## 2014-05-24 ENCOUNTER — Encounter (HOSPITAL_COMMUNITY): Payer: BC Managed Care – PPO | Attending: Hematology and Oncology

## 2014-05-24 DIAGNOSIS — K5000 Crohn's disease of small intestine without complications: Secondary | ICD-10-CM | POA: Diagnosis present

## 2014-05-24 DIAGNOSIS — K50011 Crohn's disease of small intestine with rectal bleeding: Secondary | ICD-10-CM | POA: Insufficient documentation

## 2014-05-24 DIAGNOSIS — D509 Iron deficiency anemia, unspecified: Secondary | ICD-10-CM | POA: Diagnosis not present

## 2014-05-24 LAB — CBC WITH DIFFERENTIAL/PLATELET
Basophils Absolute: 0 10*3/uL (ref 0.0–0.1)
Basophils Relative: 1 % (ref 0–1)
EOS PCT: 1 % (ref 0–5)
Eosinophils Absolute: 0.1 10*3/uL (ref 0.0–0.7)
HEMATOCRIT: 38.3 % (ref 36.0–46.0)
Hemoglobin: 12.6 g/dL (ref 12.0–15.0)
LYMPHS PCT: 29 % (ref 12–46)
Lymphs Abs: 2 10*3/uL (ref 0.7–4.0)
MCH: 28.5 pg (ref 26.0–34.0)
MCHC: 32.9 g/dL (ref 30.0–36.0)
MCV: 86.7 fL (ref 78.0–100.0)
MONO ABS: 0.6 10*3/uL (ref 0.1–1.0)
Monocytes Relative: 8 % (ref 3–12)
Neutro Abs: 4.3 10*3/uL (ref 1.7–7.7)
Neutrophils Relative %: 61 % (ref 43–77)
Platelets: 246 10*3/uL (ref 150–400)
RBC: 4.42 MIL/uL (ref 3.87–5.11)
RDW: 13.1 % (ref 11.5–15.5)
WBC: 7 10*3/uL (ref 4.0–10.5)

## 2014-05-24 NOTE — Progress Notes (Signed)
[name] presented for labwork. Labs per MD order drawn via Peripheral Line 25 gauge needle inserted in rt ac  Good blood return present. Procedure without incident.  Needle removed intact. Patient tolerated procedure well.

## 2014-05-25 ENCOUNTER — Encounter (HOSPITAL_COMMUNITY): Payer: Self-pay | Admitting: Oncology

## 2014-05-25 ENCOUNTER — Encounter (HOSPITAL_BASED_OUTPATIENT_CLINIC_OR_DEPARTMENT_OTHER): Payer: BC Managed Care – PPO | Admitting: Oncology

## 2014-05-25 VITALS — BP 121/52 | HR 72 | Temp 98.1°F | Resp 20 | Wt 207.0 lb

## 2014-05-25 DIAGNOSIS — D509 Iron deficiency anemia, unspecified: Secondary | ICD-10-CM

## 2014-05-25 DIAGNOSIS — K50011 Crohn's disease of small intestine with rectal bleeding: Secondary | ICD-10-CM

## 2014-05-25 LAB — IRON AND TIBC
Iron: 30 ug/dL — ABNORMAL LOW (ref 42–135)
Saturation Ratios: 13 % — ABNORMAL LOW (ref 20–55)
TIBC: 238 ug/dL — ABNORMAL LOW (ref 250–470)
UIBC: 208 ug/dL (ref 125–400)

## 2014-05-25 LAB — FERRITIN: Ferritin: 258 ng/mL (ref 10–291)

## 2014-05-25 NOTE — Patient Instructions (Signed)
[place] Discharge Instructions  RECOMMENDATIONS MADE BY THE CONSULTANT AND ANY TEST RESULTS WILL BE SENT TO YOUR REFERRING PHYSICIAN. We will see you in 3 and 6 months for lab work.   You will see the doctor in 6 months.  Please call for any questions or concerns.   Thank you for choosing [facility] to provide your oncology and hematology care.  To afford each patient quality time with our providers, please arrive at least 15 minutes before your scheduled appointment time.  With your help, our goal is to use those 15 minutes to complete the necessary work-up to ensure our physicians have the information they need to help with your evaluation and healthcare recommendations.    Effective January 1st, 2014, we ask that you re-schedule your appointment with our physicians should you arrive 10 or more minutes late for your appointment.  We strive to give you quality time with our providers, and arriving late affects you and other patients whose appointments are after yours.    Again, thank you for choosing [facility].  Our hope is that these requests will decrease the amount of time that you wait before being seen by our physicians.       _____________________________________________________________  Should you have questions after your visit to [facility], please contact our office at [phone] between the hours of 8:30 a.m. and 4:30 p.m.  Voicemails left after 4:30 p.m. will not be returned until the following business day.  For prescription refill requests, have your pharmacy contact our office with your prescription refill request.    _______________________________________________________________  We hope that we have given you very good care.  You may receive a patient satisfaction survey in the mail, please complete it and return it as soon as possible.  We value your  feedback!  _______________________________________________________________  Have you asked about our STAR program?  STAR stands for Survivorship Training and Rehabilitation, and this is a nationally recognized cancer care program that focuses on survivorship and rehabilitation.  Cancer and cancer treatments may cause problems, such as, pain, making you feel tired and keeping you from doing the things that you need or want to do. Cancer rehabilitation can help. Our goal is to reduce these troubling effects and help you have the best quality of life possible.  You may receive a survey from a nurse that asks questions about your current state of health.  Based on the survey results, all eligible patients will be referred to the [facility] program for an evaluation so we can better serve you!  A frequently asked questions sheet is available upon request.

## 2014-05-25 NOTE — Progress Notes (Signed)
[name], MD [place] [address]  Iron deficiency anemia  Crohn's ileitis, with rectal bleeding  CURRENT THERAPY: Last IV Feraheme infusion was on 12/09/2013  INTERVAL HISTORY: [name] 63 y.o. female returns for  regular  visit for followup of mixed anemia with contributions from malabsorption and chronic blood loss in the setting of Crohn's disease.  I personally reviewed and went over laboratory results with the patient.  The results are noted within this dictation.  Her iron studies most recently are very good with adequate ferritin levels in the 250's.  Hgb is WNL.  She reports that when her Ferritin is down in the 120's she feels worse.  I would prefer to wait until Ferritin is down below 100 to avoid iron overload syndrome unless there are other findings in the iron study results to lean to a more liberal approach to iron infusion.  The patient is educated that we can no longer administer 1020 mg of IV Feraheme and instead will receive 510 mg on days 1 and 8 in the future.  Hematologically, she denies any complaints and ROS questioning is negative.   Past Medical History  Diagnosis Date  . Gastric ulcer   . Hives   . Fibromyalgia   . Obesity   . Endometriosis     HISTORY  LEADING TO HYSTERECTOMY  . Helicobacter pylori gastritis     HISTORY  . Iron (Fe) deficiency anemia 2009    JAN 2012 NL HB & FERRITIN  . Crohn disease JULY 2011 FEDA    PERSISTENT TI ULCERS seen on CE, NO NSAID USE  . Plantar fasciitis   . Fibromyalgia 6/76/1950    has HELICOBACTER PYLORI GASTRITIS; Iron deficiency anemia; Crohn's ileitis; Fibromyalgia; and Impaired fasting glucose on her problem list.     is allergic to celebrex and sulfonamide derivatives.  [name] does not currently have medications on file.  Past Surgical History  Procedure Laterality Date  . Ovarian cyst removal    . Total abdominal hysterectomy w/ bilateral salpingoophorectomy    .  Upper gastrointestinal endoscopy  JULY 2011 DIARRHEA/FEDA     Bx-H.PYLORI GASTRITIS, NL DUODENUM  . Ileocolonoscopy  10/14/2007    Multiple 1-3 mm terminal ileum ulcers biopsied  The ulcers seemed to only involve the last 5 cm of her terminal ileum, and 10 cm of her ileum was visualized.  Otherwise the colon  was without polyps, masses, diverticula, or AVMs.  She had a normal retroflexed view of the rectum  . Esophagogastroduodenoscopy  10/14/2007    Esophagus showed 2 cm pink tongue seen extending from the GE junction.  Biopsies obtained via cold forceps.  Otherwise the  esophagus was without erosions, mass, ulceration, or stricture / . Large hiatal hernia pouch with linear erosions, and two 3-6 mmulcers seen in the pouch.  Biopsies obtained via cold forceps to  evaluate for H. pylori gastritis or malignancy    Denies any headaches, dizziness, double vision, fevers, chills, night sweats, nausea, vomiting, diarrhea, constipation, chest pain, heart palpitations, shortness of breath, blood in stool, black tarry stool, urinary pain, urinary burning, urinary frequency, hematuria.   PHYSICAL EXAMINATION  ECOG PERFORMANCE STATUS: 1 - Symptomatic but completely ambulatory  Filed Vitals:   05/25/14 1400  BP: 121/52  Pulse: 72  Temp: 98.1 F (36.7 C)  Resp: 20    GENERAL:alert, no distress, well nourished, well developed, comfortable, cooperative and obese SKIN: skin color, texture, turgor are normal, no  rashes or significant lesions HEAD: Normocephalic, No masses, lesions, tenderness or abnormalities EYES: normal, PERRLA, EOMI, Conjunctiva are pink and non-injected EARS: External ears normal OROPHARYNX:mucous membranes are moist  NECK: supple, no adenopathy, thyroid normal size, non-tender, without nodularity, no stridor, non-tender, trachea midline LYMPH:  no palpable lymphadenopathy BREAST:not examined LUNGS: clear to auscultation and percussion HEART: regular rate & rhythm, no murmurs and  no gallops ABDOMEN:abdomen soft, obese and normal bowel sounds BACK: Back symmetric, no curvature. EXTREMITIES:less then 2 second capillary refill, no joint deformities, effusion, or inflammation, no edema, no skin discoloration, no clubbing, no cyanosis  NEURO: alert & oriented x 3 with fluent speech, no focal motor/sensory deficits, gait normal   LABORATORY DATA: CBC    Component Value Date/Time   WBC 7.0 05/24/2014 1427   RBC 4.42 05/24/2014 1427   HGB 12.6 05/24/2014 1427   HCT 38.3 05/24/2014 1427   PLT 246 05/24/2014 1427   MCV 86.7 05/24/2014 1427   MCH 28.5 05/24/2014 1427   MCHC 32.9 05/24/2014 1427   RDW 13.1 05/24/2014 1427   LYMPHSABS 2.0 05/24/2014 1427   MONOABS 0.6 05/24/2014 1427   EOSABS 0.1 05/24/2014 1427   BASOSABS 0.0 05/24/2014 1427      Chemistry      Component Value Date/Time   NA 140 03/09/2014 0711   K 4.3 03/09/2014 0711   CL 106 03/09/2014 0711   CO2 26 03/09/2014 0711   BUN 15 03/09/2014 0711   CREATININE 0.57 03/09/2014 0711      Component Value Date/Time   CALCIUM 9.3 03/09/2014 0711   ALKPHOS 92 03/09/2014 0711   AST 14 03/09/2014 0711   ALT 18 03/09/2014 0711   BILITOT 0.6 03/09/2014 0711     Lab Results  Component Value Date   IRON 30* 05/24/2014   TIBC 238* 05/24/2014   FERRITIN 258 05/24/2014      ASSESSMENT:  1. Mixed anemia with contributions from chronic blood loss and malabsorption, requiring intermittent IV Feraheme  2. Crohn's disease, controlled  3. Fibromyalgia, on treatment  Patient Active Problem List   Diagnosis Date Noted  . Impaired fasting glucose 03/29/2014  . Fibromyalgia 12/09/2013  . Crohn's ileitis 01/17/2011  . HELICOBACTER PYLORI GASTRITIS 04/10/2010  . Iron deficiency anemia 04/10/2010    PLAN:  1. I personally reviewed and went over laboratory results with the patient.  The results are noted within this dictation. 2. Labs in 3 and 6 months: CBC diff, Ferritin, Iron/TIBC, soluble transferrin 3. Return  in 6 months for follow-up   THERAPY PLAN:  We will continue to monitor labs and provide iron support when labs dictate. I think a reasonable goal is to maintain a ferritin of 100 or greater. She feels better when her ferritin is greater than 100   All questions were answered. The patient knows to call the clinic with any problems, questions or concerns. We can certainly see the patient much sooner if necessary.  Patient and plan discussed with Dr. Farrel Gobble and he is in agreement with the aforementioned.   KEFALAS,THOMAS 05/25/2014

## 2014-05-26 ENCOUNTER — Ambulatory Visit (HOSPITAL_COMMUNITY): Payer: BC Managed Care – PPO

## 2014-05-27 ENCOUNTER — Other Ambulatory Visit: Payer: Self-pay | Admitting: Gastroenterology

## 2014-05-27 LAB — SOLUBLE TRANSFERRIN RECEPTOR: Transferrin Receptor, Soluble: 1.72 mg/L (ref 0.76–1.76)

## 2014-05-30 ENCOUNTER — Telehealth: Payer: Self-pay | Admitting: Gastroenterology

## 2014-05-30 MED ORDER — MESALAMINE ER 500 MG PO CPCR: 1000.0000 mg | ORAL_CAPSULE | Freq: Four times a day (QID) | ORAL | Status: DC

## 2014-05-30 NOTE — Telephone Encounter (Signed)
Sending to the refill box.  

## 2014-05-30 NOTE — Telephone Encounter (Signed)
Refill for omeprazole was done 05/27/14. I have sent rx for pentasa.

## 2014-05-30 NOTE — Telephone Encounter (Signed)
Patient has OV with SF on 11/19 at 3pm and she is running out of her medicines (pentasa and omeprazole) She asked if we could call in her refills or could she get samples to hold her until her OV. She uses [facility] in [place]

## 2014-06-03 ENCOUNTER — Other Ambulatory Visit: Payer: Self-pay | Admitting: Gastroenterology

## 2014-06-04 ENCOUNTER — Other Ambulatory Visit: Payer: Self-pay | Admitting: Gastroenterology

## 2014-06-16 ENCOUNTER — Ambulatory Visit (INDEPENDENT_AMBULATORY_CARE_PROVIDER_SITE_OTHER): Payer: BC Managed Care – PPO | Admitting: Gastroenterology

## 2014-06-16 ENCOUNTER — Encounter: Payer: Self-pay | Admitting: Gastroenterology

## 2014-06-16 VITALS — BP 110/82 | HR 72 | Temp 97.0°F | Ht 64.0 in | Wt 209.8 lb

## 2014-06-16 DIAGNOSIS — K219 Gastro-esophageal reflux disease without esophagitis: Secondary | ICD-10-CM | POA: Insufficient documentation

## 2014-06-16 DIAGNOSIS — K5000 Crohn's disease of small intestine without complications: Secondary | ICD-10-CM

## 2014-06-16 DIAGNOSIS — D509 Iron deficiency anemia, unspecified: Secondary | ICD-10-CM

## 2014-06-16 NOTE — Progress Notes (Signed)
ON RECALL LIST  °

## 2014-06-16 NOTE — Progress Notes (Signed)
Subjective:    Patient ID: [name], female    DOB: 1950-10-08, 63 y.o.   MRN: [id number]  [name], MD  HPI C/o dry eyes. HAS SEEN AN OPTHMAOLOGIST. USING DROPS AND THEY HELP. RARELY GETS BLOOD SHOT. OCT 2014: 203 LBS. BMs: REGULAR. MAY HAVE DIARRHEA IF SHE EATS SOMETHING GREASY. MAY HAVE "FLU LIKE SX" FROM HER FIBROMYALGIAS.   PT DENIES FEVER, CHILLS, HEMATOCHEZIA,  nausea, vomiting, melena, CHEST PAIN, SHORTNESS OF BREATH,  constipation, abdominal pain, problems swallowing, SORES IN MOUTH, RASH ON LEGS, JOINT PAIN, BACK PAIN, OR heartburn or indigestion.    Past Medical History  Diagnosis Date  . Gastric ulcer   . Hives   . Fibromyalgia   . Obesity   . Endometriosis     HISTORY  LEADING TO HYSTERECTOMY  . Helicobacter pylori gastritis     HISTORY  . Iron (Fe) deficiency anemia 2009    JAN 2012 NL HB & FERRITIN  . Crohn disease JULY 2011 FEDA    PERSISTENT TI ULCERS seen on CE, NO NSAID USE  . Plantar fasciitis   . Fibromyalgia 12/09/2013   Past Surgical History  Procedure Laterality Date  . Ovarian cyst removal    . Total abdominal hysterectomy w/ bilateral salpingoophorectomy    . Upper gastrointestinal endoscopy  JULY 2011 DIARRHEA/FEDA     Bx-H.PYLORI GASTRITIS, NL DUODENUM  . Ileocolonoscopy  10/14/2007    Multiple 1-3 mm terminal ileum ulcers biopsied  The ulcers seemed to only involve the last 5 cm of her terminal ileum, and 10 cm of her ileum was visualized.  Otherwise the colon  was without polyps, masses, diverticula, or AVMs.  She had a normal retroflexed view of the rectum  . Esophagogastroduodenoscopy  10/14/2007    Esophagus showed 2 cm pink tongue seen extending from the GE junction.  Biopsies obtained via cold forceps.  Otherwise the  esophagus was without erosions, mass, ulceration, or stricture / . Large hiatal hernia pouch with linear erosions, and two 3-6 mmulcers seen in the pouch.  Biopsies obtained via cold forceps to  evaluate for H. pylori  gastritis or malignancy   Allergies  Allergen Reactions  . Celebrex [Celecoxib] Hives and Itching  . Sulfonamide Derivatives    Current Outpatient Prescriptions  Medication Sig Dispense Refill  . acetaminophen (TYLENOL) 325 MG tablet Take 650 mg by mouth as needed.      . cyclobenzaprine (FLEXERIL) 5 MG tablet 1/2 - 1 po qhs prn muscle spasms    . mesalamine (PENTASA) 500 MG CR capsule Take 2 capsules (1,000 mg total) by mouth 4 (four) times daily.    Marland Kitchen omeprazole (PRILOSEC) 20 MG capsule TAKE 1 CAPSULE (20 MG TOTAL) BY MOUTH DAILY.      Review of Systems     Objective:   Physical Exam  Constitutional: She is oriented to person, place, and time. She appears well-developed and well-nourished. No distress.  HENT:  Head: Normocephalic and atraumatic.  Mouth/Throat: Oropharynx is clear and moist. No oropharyngeal exudate.  Eyes: Pupils are equal, round, and reactive to light. No scleral icterus.  Neck: Normal range of motion. Neck supple.  Cardiovascular: Normal rate, regular rhythm and normal heart sounds.   Pulmonary/Chest: Effort normal and breath sounds normal. No respiratory distress.  Abdominal: Soft. Bowel sounds are normal. She exhibits no distension. There is no tenderness.  Musculoskeletal: She exhibits no edema.  LIMITED RANGE OF MOTION-TROUBLE GOING FROM SITTING TO STANDING  Lymphadenopathy:    She has  no cervical adenopathy.  Neurological: She is alert and oriented to person, place, and time.  Psychiatric: She has a normal mood and affect.  Vitals reviewed.         Assessment & Plan:

## 2014-06-16 NOTE — Assessment & Plan Note (Signed)
NO SIGNS OR SYMPTOMS OF ACTIVE GI BLEED.  CONTINUE TO MONITOR SYMPTOMS.

## 2014-06-16 NOTE — Patient Instructions (Signed)
CONTINUE PENTASA AND OMEPRAZOLE.  FOLLOW A HIGH FIBER/LOW FAT DIET. SEE INFO BELOW.  FOLLOW UP IN 1 YEAR.    High-Fiber Diet A high-fiber diet changes your normal diet to include more whole grains, legumes, fruits, and vegetables. Changes in the diet involve replacing refined carbohydrates with unrefined foods. The calorie level of the diet is essentially unchanged. The Dietary Reference Intake (recommended amount) for adult males is 38 grams per day. For adult females, it is 25 grams per day. Pregnant and lactating women should consume 28 grams of fiber per day. Fiber is the intact part of a plant that is not broken down during digestion. Functional fiber is fiber that has been isolated from the plant to provide a beneficial effect in the body. PURPOSE  Increase stool bulk.   Ease and regulate bowel movements.   Lower cholesterol.  INDICATIONS THAT YOU NEED MORE FIBER  Constipation and hemorrhoids.   Uncomplicated diverticulosis (intestine condition) and irritable bowel syndrome.   Weight management.   As a protective measure against hardening of the arteries (atherosclerosis), diabetes, and cancer.   GUIDELINES FOR INCREASING FIBER IN THE DIET  Start adding fiber to the diet slowly. A gradual increase of about 5 more grams (2 slices of whole-wheat bread, 2 servings of most fruits or vegetables, or 1 bowl of high-fiber cereal) per day is best. Too rapid an increase in fiber may result in constipation, flatulence, and bloating.   Drink enough water and fluids to keep your urine clear or pale yellow. Water, juice, or caffeine-free drinks are recommended. Not drinking enough fluid may cause constipation.   Eat a variety of high-fiber foods rather than one type of fiber.   Try to increase your intake of fiber through using high-fiber foods rather than fiber pills or supplements that contain small amounts of fiber.   The goal is to change the types of food eaten. Do not supplement  your present diet with high-fiber foods, but replace foods in your present diet.  INCLUDE A VARIETY OF FIBER SOURCES  Replace refined and processed grains with whole grains, canned fruits with fresh fruits, and incorporate other fiber sources. White rice, white breads, and most bakery goods contain little or no fiber.   Brown whole-grain rice, buckwheat oats, and many fruits and vegetables are all good sources of fiber. These include: broccoli, Brussels sprouts, cabbage, cauliflower, beets, sweet potatoes, white potatoes (skin on), carrots, tomatoes, eggplant, squash, berries, fresh fruits, and dried fruits.   Cereals appear to be the richest source of fiber. Cereal fiber is found in whole grains and bran. Bran is the fiber-rich outer coat of cereal grain, which is largely removed in refining. In whole-grain cereals, the bran remains. In breakfast cereals, the largest amount of fiber is found in those with "bran" in their names. The fiber content is sometimes indicated on the label.   You may need to include additional fruits and vegetables each day.   In baking, for 1 cup white flour, you may use the following substitutions:   1 cup whole-wheat flour minus 2 tablespoons.   1/2 cup white flour plus 1/2 cup whole-wheat flour.   Low-Fat Diet BREADS, CEREALS, PASTA, RICE, DRIED PEAS, AND BEANS These products are high in carbohydrates and most are low in fat. Therefore, they can be increased in the diet as substitutes for fatty foods. They too, however, contain calories and should not be eaten in excess. Cereals can be eaten for snacks as well as for breakfast.  FRUITS AND VEGETABLES It is good to eat fruits and vegetables. Besides being sources of fiber, both are rich in vitamins and some minerals. They help you get the daily allowances of these nutrients. Fruits and vegetables can be used for snacks and desserts.  MEATS Limit lean meat, chicken, [place], and fish to no more than 6 ounces per  day. Beef, Pork, and Lamb Use lean cuts of beef, pork, and lamb. Lean cuts include:  Extra-lean ground beef.  Arm roast.  Sirloin tip.  Center-cut ham.  Round steak.  Loin chops.  Rump roast.  Tenderloin.  Trim all fat off the outside of meats before cooking. It is not necessary to severely decrease the intake of red meat, but lean choices should be made. Lean meat is rich in protein and contains a highly absorbable form of iron. Premenopausal women, in particular, should avoid reducing lean red meat because this could increase the risk for low red blood cells (iron-deficiency anemia).  Chicken and [place] These are good sources of protein. The fat of poultry can be reduced by removing the skin and underlying fat layers before cooking. Chicken and [place] can be substituted for lean red meat in the diet. Poultry should not be fried or covered with high-fat sauces. Fish and Shellfish Fish is a good source of protein. Shellfish contain cholesterol, but they usually are low in saturated fatty acids. The preparation of fish is important. Like chicken and [place], they should not be fried or covered with high-fat sauces. EGGS Egg whites contain no fat or cholesterol. They can be eaten often. Try 1 to 2 egg whites instead of whole eggs in recipes or use egg substitutes that do not contain yolk. MILK AND DAIRY PRODUCTS Use skim or 1% milk instead of 2% or whole milk. Decrease whole milk, natural, and processed cheeses. Use nonfat or low-fat (2%) cottage cheese or low-fat cheeses made from vegetable oils. Choose nonfat or low-fat (1 to 2%) yogurt. Experiment with evaporated skim milk in recipes that call for heavy cream. Substitute low-fat yogurt or low-fat cottage cheese for sour cream in dips and salad dressings. Have at least 2 servings of low-fat dairy products, such as 2 glasses of skim (or 1%) milk each day to help get your daily calcium intake. FATS AND OILS Reduce the total intake of fats,  especially saturated fat. Butterfat, lard, and beef fats are high in saturated fat and cholesterol. These should be avoided as much as possible. Vegetable fats do not contain cholesterol, but certain vegetable fats, such as coconut oil, palm oil, and palm kernel oil are very high in saturated fats. These should be limited. These fats are often used in bakery goods, processed foods, popcorn, oils, and nondairy creamers. Vegetable shortenings and some peanut butters contain hydrogenated oils, which are also saturated fats. Read the labels on these foods and check for saturated vegetable oils. Unsaturated vegetable oils and fats do not raise blood cholesterol. However, they should be limited because they are fats and are high in calories. Total fat should still be limited to 30% of your daily caloric intake. Desirable liquid vegetable oils are corn oil, cottonseed oil, olive oil, canola oil, safflower oil, soybean oil, and sunflower oil. Peanut oil is not as good, but small amounts are acceptable. Buy a heart-healthy tub margarine that has no partially hydrogenated oils in the ingredients. Mayonnaise and salad dressings often are made from unsaturated fats, but they should also be limited because of their high calorie and fat  content. Seeds, nuts, peanut butter, olives, and avocados are high in fat, but the fat is mainly the unsaturated type. These foods should be limited mainly to avoid excess calories and fat. OTHER EATING TIPS Snacks  Most sweets should be limited as snacks. They tend to be rich in calories and fats, and their caloric content outweighs their nutritional value. Some good choices in snacks are graham crackers, melba toast, soda crackers, bagels (no egg), English muffins, fruits, and vegetables. These snacks are preferable to snack crackers, [place] fries, TORTILLA CHIPS, and POTATO chips. Popcorn should be air-popped or cooked in small amounts of liquid vegetable oil. Desserts Eat fruit, low-fat  yogurt, and fruit ices instead of pastries, cake, and cookies. Sherbet, angel food cake, gelatin dessert, frozen low-fat yogurt, or other frozen products that do not contain saturated fat (pure fruit juice bars, frozen ice pops) are also acceptable.  COOKING METHODS Choose those methods that use little or no fat. They include: Poaching.  Braising.  Steaming.  Grilling.  Baking.  Stir-frying.  Broiling.  Microwaving.  Foods can be cooked in a nonstick pan without added fat, or use a nonfat cooking spray in regular cookware. Limit fried foods and avoid frying in saturated fat. Add moisture to lean meats by using water, broth, cooking wines, and other nonfat or low-fat sauces along with the cooking methods mentioned above. Soups and stews should be chilled after cooking. The fat that forms on top after a few hours in the refrigerator should be skimmed off. When preparing meals, avoid using excess salt. Salt can contribute to raising blood pressure in some people.  EATING AWAY FROM HOME Order entres, potatoes, and vegetables without sauces or butter. When meat exceeds the size of a deck of cards (3 to 4 ounces), the rest can be taken home for another meal. Choose vegetable or fruit salads and ask for low-calorie salad dressings to be served on the side. Use dressings sparingly. Limit high-fat toppings, such as bacon, crumbled eggs, cheese, sunflower seeds, and olives. Ask for heart-healthy tub margarine instead of butter.

## 2014-06-16 NOTE — Assessment & Plan Note (Signed)
SX CONTROLLED.  CONTINUE PENTASA. REFILLS x1 YR FOLLOW UP IN 1 YEAR.

## 2014-06-16 NOTE — Assessment & Plan Note (Signed)
SX CONTROLLED.  CONTINUE OMEPRAZOLE.  TAKE 30 MINUTES PRIOR TO YOUR FIRST MEAL. FOLLOW UP IN 1 YEAR.

## 2014-06-16 NOTE — Progress Notes (Signed)
cc'ed to pcp °

## 2014-07-14 ENCOUNTER — Ambulatory Visit: Payer: BC Managed Care – PPO | Admitting: Gastroenterology

## 2014-08-15 ENCOUNTER — Ambulatory Visit: Payer: Self-pay | Admitting: Family Medicine

## 2014-08-16 ENCOUNTER — Ambulatory Visit: Payer: Self-pay | Admitting: Family Medicine

## 2014-08-18 ENCOUNTER — Other Ambulatory Visit (HOSPITAL_COMMUNITY): Payer: Self-pay

## 2014-08-23 ENCOUNTER — Encounter (HOSPITAL_COMMUNITY): Payer: BLUE CROSS/BLUE SHIELD | Attending: Hematology & Oncology

## 2014-08-23 DIAGNOSIS — K50011 Crohn's disease of small intestine with rectal bleeding: Secondary | ICD-10-CM

## 2014-08-23 DIAGNOSIS — D509 Iron deficiency anemia, unspecified: Secondary | ICD-10-CM | POA: Insufficient documentation

## 2014-08-23 LAB — CBC WITH DIFFERENTIAL/PLATELET
BASOS ABS: 0 10*3/uL (ref 0.0–0.1)
Basophils Relative: 0 % (ref 0–1)
EOS PCT: 1 % (ref 0–5)
Eosinophils Absolute: 0.1 10*3/uL (ref 0.0–0.7)
HEMATOCRIT: 37.4 % (ref 36.0–46.0)
Hemoglobin: 12.2 g/dL (ref 12.0–15.0)
Lymphocytes Relative: 28 % (ref 12–46)
Lymphs Abs: 2 10*3/uL (ref 0.7–4.0)
MCH: 28.4 pg (ref 26.0–34.0)
MCHC: 32.6 g/dL (ref 30.0–36.0)
MCV: 87.2 fL (ref 78.0–100.0)
Monocytes Absolute: 0.5 10*3/uL (ref 0.1–1.0)
Monocytes Relative: 7 % (ref 3–12)
NEUTROS PCT: 64 % (ref 43–77)
Neutro Abs: 4.7 10*3/uL (ref 1.7–7.7)
Platelets: 261 10*3/uL (ref 150–400)
RBC: 4.29 MIL/uL (ref 3.87–5.11)
RDW: 13.6 % (ref 11.5–15.5)
WBC: 7.4 10*3/uL (ref 4.0–10.5)

## 2014-08-24 LAB — FERRITIN: Ferritin: 175 ng/mL (ref 10–291)

## 2014-08-25 ENCOUNTER — Other Ambulatory Visit (HOSPITAL_COMMUNITY): Payer: BC Managed Care – PPO

## 2014-08-29 LAB — SOLUBLE TRANSFERRIN RECEPTOR: Transferrin Receptor, Soluble: 1.65 mg/L (ref 0.76–1.76)

## 2014-11-10 ENCOUNTER — Encounter: Payer: Self-pay | Admitting: Nurse Practitioner

## 2014-11-10 ENCOUNTER — Ambulatory Visit (INDEPENDENT_AMBULATORY_CARE_PROVIDER_SITE_OTHER): Payer: BLUE CROSS/BLUE SHIELD | Admitting: Nurse Practitioner

## 2014-11-10 VITALS — BP 130/82 | Temp 98.2°F | Ht 64.0 in | Wt 208.4 lb

## 2014-11-10 DIAGNOSIS — M549 Dorsalgia, unspecified: Secondary | ICD-10-CM

## 2014-11-10 DIAGNOSIS — Z13820 Encounter for screening for osteoporosis: Secondary | ICD-10-CM

## 2014-11-10 DIAGNOSIS — G8929 Other chronic pain: Secondary | ICD-10-CM | POA: Diagnosis not present

## 2014-11-10 DIAGNOSIS — M797 Fibromyalgia: Secondary | ICD-10-CM

## 2014-11-10 MED ORDER — PREDNISONE 20 MG PO TABS: ORAL_TABLET | ORAL | Status: DC

## 2014-11-10 MED ORDER — METHYLPREDNISOLONE ACETATE 40 MG/ML IJ SUSP
40.0000 mg | Freq: Once | INTRAMUSCULAR | Status: AC
  Administered 2014-11-10: 40 mg via INTRAMUSCULAR

## 2014-11-10 NOTE — Patient Instructions (Addendum)
Icy Hot Smart Relief TENS unit Consider medicine such as gabapentin or amitriptyline

## 2014-11-12 ENCOUNTER — Encounter: Payer: Self-pay | Admitting: Nurse Practitioner

## 2014-11-12 NOTE — Progress Notes (Signed)
Subjective:  Presents for recheck on chronic back pain. 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 Rare use of flexeril. Also uses ice and heat. Pain has increased lately. Has not had a bone density test. Regular follow up with oncology for iron deficiency anemia.   Objective:   BP 130/82 mmHg  Temp(Src) 98.2 F (36.8 C) (Oral)  Ht 5' 4"  (1.626 m)  Wt 208 lb 6 oz (94.518 kg)  BMI 35.75 kg/m2 NAD. Alert, oriented. Lungs clear. Heart RRR. Height stable.   Assessment:  Problem List Items Addressed This Visit      Musculoskeletal and Integument   Fibromyalgia - Primary   Relevant Orders   DG Bone Density     Other   Chronic back pain   Relevant Medications   predniSONE (DELTASONE) 20 MG tablet   methylPREDNISolone acetate (DEPO-MEDROL) injection 40 mg (Completed)   Other Relevant Orders   DG Bone Density     Plan:  Meds ordered this encounter  Medications  . predniSONE (DELTASONE) 20 MG tablet    Sig: 3 po qd x 3 d then 2 po qd x 3 d then 1 po qd x 3 d    Dispense:  18 tablet    Refill:  0    Order Specific Question:  Supervising Provider    Answer:  Mikey Kirschner [[id number]]  . methylPREDNISolone acetate (DEPO-MEDROL) injection 40 mg    Sig:    Prednisone taper for acute flare up. Defers further work up or referral for back pain at this time. Icy Hot Smart Relief TENS unit Consider medicine such as gabapentin or amitriptyline  Recommend daily vitamin D/calcium and weight loss. Strongly recommend wellness physical.  Return in about 6 months (around 05/12/2015).

## 2014-11-14 ENCOUNTER — Ambulatory Visit: Payer: Self-pay | Admitting: Nurse Practitioner

## 2014-11-16 ENCOUNTER — Ambulatory Visit (HOSPITAL_COMMUNITY)
Admission: RE | Admit: 2014-11-16 | Discharge: 2014-11-16 | Disposition: A | Discharge status: Home / Self Care | Payer: BLUE CROSS/BLUE SHIELD | Source: Ambulatory Visit | Attending: Nurse Practitioner | Admitting: Nurse Practitioner

## 2014-11-16 DIAGNOSIS — Z13820 Encounter for screening for osteoporosis: Secondary | ICD-10-CM | POA: Diagnosis not present

## 2014-11-16 DIAGNOSIS — M549 Dorsalgia, unspecified: Secondary | ICD-10-CM | POA: Diagnosis not present

## 2014-11-16 DIAGNOSIS — G8929 Other chronic pain: Secondary | ICD-10-CM | POA: Insufficient documentation

## 2014-11-17 ENCOUNTER — Other Ambulatory Visit (HOSPITAL_COMMUNITY): Payer: Self-pay

## 2014-11-20 ENCOUNTER — Encounter (HOSPITAL_COMMUNITY): Payer: Self-pay | Admitting: Oncology

## 2014-11-20 DIAGNOSIS — M810 Age-related osteoporosis without current pathological fracture: Secondary | ICD-10-CM

## 2014-11-20 HISTORY — DX: Age-related osteoporosis without current pathological fracture: M81.0

## 2014-11-20 NOTE — Progress Notes (Signed)
Sara [place], MD [name] [id number]  Osteoporosis  Iron deficiency anemia - Plan: CBC with Differential, Iron and TIBC, Ferritin  CURRENT THERAPY: Last IV Feraheme infusion was on 12/09/2013  INTERVAL HISTORY: [name] 64 y.o. female returns for followup of mixed anemia with contributions from malabsorption and chronic blood loss in the setting of Crohn's disease.  I personally reviewed and went over laboratory results with the patient.  The results are noted within this dictation.  Her iron studies are stable and Hgb is up nearly another gram.  She notes that she is fatigued and I cannot relate this to anemia or iron deficiency at this time.    Otherwise, hematologically, she denies any complaints and ROS questioning is negative.   Past Medical History  Diagnosis Date  . Gastric ulcer   . Hives   . Fibromyalgia   . Obesity   . Endometriosis     HISTORY  LEADING TO HYSTERECTOMY  . Helicobacter pylori gastritis     HISTORY  . Iron (Fe) deficiency anemia 2009    JAN 2012 NL HB & FERRITIN  . Crohn disease JULY 2011 FEDA    PERSISTENT TI ULCERS seen on CE, NO NSAID USE  . Plantar fasciitis   . Fibromyalgia 12/09/2013  . Osteoporosis 11/20/2014    has Iron deficiency anemia; Crohn's ileitis; Fibromyalgia; Impaired fasting glucose; GERD (gastroesophageal reflux disease); Chronic back pain; and Osteoporosis on her problem list.     is allergic to celebrex and sulfonamide derivatives.  [name] does not currently have medications on file.  Past Surgical History  Procedure Laterality Date  . Ovarian cyst removal    . Total abdominal hysterectomy w/ bilateral salpingoophorectomy    . Upper gastrointestinal endoscopy  JULY 2011 DIARRHEA/FEDA     Bx-H.PYLORI GASTRITIS, NL DUODENUM  . Ileocolonoscopy  10/14/2007    Multiple 1-3 mm terminal ileum ulcers biopsied  The ulcers seemed to only involve the last 5 cm of her terminal ileum, and  10 cm of her ileum was visualized.  Otherwise the colon  was without polyps, masses, diverticula, or AVMs.  She had a normal retroflexed view of the rectum  . Esophagogastroduodenoscopy  10/14/2007    Esophagus showed 2 cm pink tongue seen extending from the GE junction.  Biopsies obtained via cold forceps.  Otherwise the  esophagus was without erosions, mass, ulceration, or stricture / . Large hiatal hernia pouch with linear erosions, and two 3-6 mmulcers seen in the pouch.  Biopsies obtained via cold forceps to  evaluate for H. pylori gastritis or malignancy    Denies any headaches, dizziness, double vision, fevers, chills, night sweats, nausea, vomiting, diarrhea, constipation, chest pain, heart palpitations, shortness of breath, blood in stool, black tarry stool, urinary pain, urinary burning, urinary frequency, hematuria.   PHYSICAL EXAMINATION  ECOG PERFORMANCE STATUS: 1 - Symptomatic but completely ambulatory  Filed Vitals:   11/23/14 1538  BP: 130/58  Pulse: 68  Temp: 97.8 F (36.6 C)  Resp: 16    GENERAL:alert, no distress, well nourished, well developed, comfortable, cooperative, obese and smiling SKIN: skin color, texture, turgor are normal, no rashes or significant lesions HEAD: Normocephalic, No masses, lesions, tenderness or abnormalities EYES: normal, PERRLA, EOMI, Conjunctiva are pink and non-injected EARS: External ears normal OROPHARYNX:lips, buccal mucosa, and tongue normal and mucous membranes are moist  NECK: supple, no adenopathy, thyroid normal size, non-tender, without nodularity, no stridor, non-tender, trachea  midline LYMPH:  no palpable lymphadenopathy, no hepatosplenomegaly BREAST:not examined LUNGS: clear to auscultation and percussion HEART: regular rate & rhythm, no murmurs, no gallops, S1 normal and S2 normal ABDOMEN:abdomen soft, non-tender and normal bowel sounds BACK: Back symmetric, no curvature., No CVA tenderness EXTREMITIES:less then 2 second  capillary refill, no joint deformities, effusion, or inflammation, no edema, no skin discoloration, no clubbing, no cyanosis  NEURO: alert & oriented x 3 with fluent speech, no focal motor/sensory deficits, gait normal   LABORATORY DATA: CBC    Component Value Date/Time   WBC 10.5 11/22/2014 1413   RBC 4.84 11/22/2014 1413   HGB 13.8 11/22/2014 1413   HCT 42.3 11/22/2014 1413   PLT 246 11/22/2014 1413   MCV 87.4 11/22/2014 1413   MCH 28.5 11/22/2014 1413   MCHC 32.6 11/22/2014 1413   RDW 14.4 11/22/2014 1413   LYMPHSABS 2.8 11/22/2014 1413   MONOABS 0.9 11/22/2014 1413   EOSABS 0.1 11/22/2014 1413   BASOSABS 0.0 11/22/2014 1413      Chemistry      Component Value Date/Time   NA 140 03/09/2014 0711   K 4.3 03/09/2014 0711   CL 106 03/09/2014 0711   CO2 26 03/09/2014 0711   BUN 15 03/09/2014 0711   CREATININE 0.57 03/09/2014 0711      Component Value Date/Time   CALCIUM 9.3 03/09/2014 0711   ALKPHOS 92 03/09/2014 0711   AST 14 03/09/2014 0711   ALT 18 03/09/2014 0711   BILITOT 0.6 03/09/2014 0711     Lab Results  Component Value Date   IRON 39* 11/22/2014   TIBC 248* 11/22/2014   FERRITIN 168 11/22/2014     RADIOGRAPHIC STUDIES:  Dg Bone Density  11/16/2014   EXAM: DUAL X-RAY ABSORPTIOMETRY (DXA) FOR BONE MINERAL DENSITY  IMPRESSION: Ordering Physician: Nilda Simmer PA-C,  Your patient [name] completed a BMD test on 11/16/2014 using the [place] (software version: 14.10) manufactured by [employer]. The following summarizes the results of our evaluation. PATIENT BIOGRAPHICAL: Name: [name], [name] Patient ID: [id number] Birth Date: [birth year] Height: 62.0 in. Gender: Female Exam Date: 11/16/2014 Weight: 215.0 lbs. Indications: Bilateral Oophrectomy, Height Loss, Low Calcium Intake, Post Menopausal Fractures: Treatments: DENSITOMETRY RESULTS: Site         Region     Measured Date Measured Age WHO Classification Young Adult T-score  BMD         %Change vs. Previous Significant Change (*) DualFemur Neck Right 11/16/2014 62.9 Osteopenia -1.9 0.774 g/cm2  Left Forearm Radius 33% 11/16/2014 62.9 Osteoporosis -2.7 0.518 g/cm2 ASSESSMENT: BMD as determined from Forearm Radius 33% is 0.518 g/cm2 with a T-Score of -2.7. This patient is considered osteoporotic according to [facility]) criteria. (Lumbar spine was not utilized due to advanced degenerative changes.) (Patient does not meet criteria for FRAX assessment.)  World Health Organization [facility]) criteria for post-menopausal, Caucasian Women: Normal:       T-score at or above -1 SD Osteopenia:   T-score between -1 and -2.5 SD Osteoporosis: T-score at or below -2.5 SD  RECOMMENDATIONS: [place] recommends that FDA-approved medial therapies be considered in postmenopausal women and men age 30 or older with a: 1. Hip or vertberal (clinical or morphometric) fracture. 2. T-Score of < -2.5 at the spine or hip. 3. Ten-year fracture probability by FRAX of 3% or greater for hip fracture or 20% or greater for major osteoporotic fracture.  All treatment decisions require clinical judgment  and consideration of indiviual patient factors, including patient preferences, co-morbidities, previous drug use, risk factors not captured in the FRAX model (e.g. falls, vitamin D deficiency, increased bone turnover, interval significant decline in bone density) and possible under-or over-estimation of fracture risk by FRAX.  All patients should ensure an adequate intake of dietary calcium (1200 mg/d) and vitamin D (800 IU daily) unless contraindicated.  FOLLOW-UP: People with diagnosed cases of osteoporosis or osteopenia should be regularly tested for bone mineral density. For patients eligible for Medicare, routine testing is allowed once every 2 years. Testing frequency can be increased for patients who have rapidly progressing disease, or for those who are receiving medical  therapy to restore bone mass.  I have reviewed this report, and agree with the above findings.  [facility]   Electronically Signed   By: Rolm Baptise M.D.   On: 11/16/2014 11:09    ASSESSMENT AND PLAN:  Iron deficiency anemia Mixed anemia with contributions from chronic blood loss and malabsorption, requiring intermittent IV Feraheme.  Labs today: CBC diff, iron/TIBC, ferritin.  Labs in 3 and 6 months: CBC diff, iron/TIBC, ferritin.  Will administer IV iron when indicated.   Return in 6 months for follow-up.   THERAPY PLAN:  We will continue to monitor labs and iron studies.  All questions were answered. The patient knows to call the clinic with any problems, questions or concerns. We can certainly see the patient much sooner if necessary.  Patient and plan discussed with Dr. Ancil Linsey and she is in agreement with the aforementioned.   This note is electronically signed by: Robynn Pane 11/23/2014 4:39 PM

## 2014-11-20 NOTE — Assessment & Plan Note (Signed)
Mixed anemia with contributions from chronic blood loss and malabsorption, requiring intermittent IV Feraheme.  Labs today: CBC diff, iron/TIBC, ferritin.  Labs in 3 and 6 months: CBC diff, iron/TIBC, ferritin.  Will administer IV iron when indicated.   Return in 6 months for follow-up.

## 2014-11-22 ENCOUNTER — Encounter (HOSPITAL_COMMUNITY): Payer: BLUE CROSS/BLUE SHIELD | Attending: Hematology & Oncology

## 2014-11-22 DIAGNOSIS — D509 Iron deficiency anemia, unspecified: Secondary | ICD-10-CM | POA: Insufficient documentation

## 2014-11-22 DIAGNOSIS — K50011 Crohn's disease of small intestine with rectal bleeding: Secondary | ICD-10-CM

## 2014-11-22 DIAGNOSIS — M810 Age-related osteoporosis without current pathological fracture: Secondary | ICD-10-CM | POA: Diagnosis not present

## 2014-11-22 LAB — CBC WITH DIFFERENTIAL/PLATELET
BASOS PCT: 0 % (ref 0–1)
Basophils Absolute: 0 10*3/uL (ref 0.0–0.1)
EOS PCT: 1 % (ref 0–5)
Eosinophils Absolute: 0.1 10*3/uL (ref 0.0–0.7)
HCT: 42.3 % (ref 36.0–46.0)
Hemoglobin: 13.8 g/dL (ref 12.0–15.0)
Lymphocytes Relative: 27 % (ref 12–46)
Lymphs Abs: 2.8 10*3/uL (ref 0.7–4.0)
MCH: 28.5 pg (ref 26.0–34.0)
MCHC: 32.6 g/dL (ref 30.0–36.0)
MCV: 87.4 fL (ref 78.0–100.0)
MONO ABS: 0.9 10*3/uL (ref 0.1–1.0)
MONOS PCT: 8 % (ref 3–12)
Neutro Abs: 6.7 10*3/uL (ref 1.7–7.7)
Neutrophils Relative %: 64 % (ref 43–77)
PLATELETS: 246 10*3/uL (ref 150–400)
RBC: 4.84 MIL/uL (ref 3.87–5.11)
RDW: 14.4 % (ref 11.5–15.5)
WBC: 10.5 10*3/uL (ref 4.0–10.5)

## 2014-11-22 NOTE — Progress Notes (Signed)
[name] presented for labwork. Labs per MD order drawn via Peripheral Line 23 gauge needle inserted in right AC  Good blood return present. Procedure without incident.  Needle removed intact. Patient tolerated procedure well.

## 2014-11-23 ENCOUNTER — Encounter (HOSPITAL_BASED_OUTPATIENT_CLINIC_OR_DEPARTMENT_OTHER): Payer: BLUE CROSS/BLUE SHIELD | Admitting: Oncology

## 2014-11-23 ENCOUNTER — Encounter (HOSPITAL_COMMUNITY): Payer: Self-pay | Admitting: Oncology

## 2014-11-23 VITALS — BP 130/58 | HR 68 | Temp 97.8°F | Resp 16 | Wt 209.2 lb

## 2014-11-23 DIAGNOSIS — M810 Age-related osteoporosis without current pathological fracture: Secondary | ICD-10-CM

## 2014-11-23 DIAGNOSIS — D509 Iron deficiency anemia, unspecified: Secondary | ICD-10-CM | POA: Diagnosis not present

## 2014-11-23 LAB — IRON AND TIBC
Iron: 39 ug/dL — ABNORMAL LOW (ref 42–145)
SATURATION RATIOS: 16 % — AB (ref 20–55)
TIBC: 248 ug/dL — ABNORMAL LOW (ref 250–470)
UIBC: 209 ug/dL (ref 125–400)

## 2014-11-23 LAB — FERRITIN: Ferritin: 168 ng/mL (ref 10–291)

## 2014-11-23 LAB — SOLUBLE TRANSFERRIN RECEPTOR: Transferrin Receptor: 21.2 nmol/L (ref 12.2–27.3)

## 2014-11-23 NOTE — Patient Instructions (Signed)
[place] at [facility] Discharge Instructions  RECOMMENDATIONS MADE BY THE CONSULTANT AND ANY TEST RESULTS WILL BE SENT TO YOUR REFERRING PHYSICIAN.  Discussion by Robynn Pane, PA-C.  Your labs are stable. Report increased fatigue or shortness of breath. Labs in 3 and 6 months Office visit in 6 months.  Thank you for choosing [place] at [facility] to provide your oncology and hematology care.  To afford each patient quality time with our provider, please arrive at least 15 minutes before your scheduled appointment time.    You need to re-schedule your appointment should you arrive 10 or more minutes late.  We strive to give you quality time with our providers, and arriving late affects you and other patients whose appointments are after yours.  Also, if you no show three or more times for appointments you may be dismissed from the clinic at the providers discretion.     Again, thank you for choosing [facility].  Our hope is that these requests will decrease the amount of time that you wait before being seen by our physicians.       _____________________________________________________________  Should you have questions after your visit to [facility], please contact our office at [phone] between the hours of 8:30 a.m. and 4:30 p.m.  Voicemails left after 4:30 p.m. will not be returned until the following business day.  For prescription refill requests, have your pharmacy contact our office.

## 2014-11-25 ENCOUNTER — Other Ambulatory Visit: Payer: Self-pay | Admitting: Nurse Practitioner

## 2014-11-25 MED ORDER — ALENDRONATE SODIUM 70 MG PO TABS: 70.0000 mg | ORAL_TABLET | ORAL | Status: DC

## 2014-12-07 ENCOUNTER — Telehealth: Payer: Self-pay

## 2014-12-07 NOTE — Telephone Encounter (Signed)
Pt is calling because she is hurting in her stomach. Pt has Crohn's dx. No fever. Se is nausea and feels like she could vomit. She would like to see [facility] this week if possible. Please advise  Her call back number [phone].

## 2014-12-08 ENCOUNTER — Ambulatory Visit (HOSPITAL_COMMUNITY)
Admission: RE | Admit: 2014-12-08 | Discharge: 2014-12-08 | Disposition: A | Discharge status: Home / Self Care | Payer: BLUE CROSS/BLUE SHIELD | Source: Ambulatory Visit | Attending: Gastroenterology | Admitting: Gastroenterology

## 2014-12-08 ENCOUNTER — Ambulatory Visit (INDEPENDENT_AMBULATORY_CARE_PROVIDER_SITE_OTHER): Payer: BLUE CROSS/BLUE SHIELD | Admitting: Gastroenterology

## 2014-12-08 ENCOUNTER — Encounter: Payer: Self-pay | Admitting: Gastroenterology

## 2014-12-08 VITALS — BP 126/81 | HR 82 | Temp 97.9°F | Ht 62.0 in | Wt 210.0 lb

## 2014-12-08 DIAGNOSIS — R1031 Right lower quadrant pain: Secondary | ICD-10-CM

## 2014-12-08 DIAGNOSIS — K219 Gastro-esophageal reflux disease without esophagitis: Secondary | ICD-10-CM

## 2014-12-08 DIAGNOSIS — R1032 Left lower quadrant pain: Secondary | ICD-10-CM | POA: Insufficient documentation

## 2014-12-08 DIAGNOSIS — R1030 Lower abdominal pain, unspecified: Secondary | ICD-10-CM | POA: Insufficient documentation

## 2014-12-08 DIAGNOSIS — N3289 Other specified disorders of bladder: Secondary | ICD-10-CM | POA: Diagnosis not present

## 2014-12-08 DIAGNOSIS — K449 Diaphragmatic hernia without obstruction or gangrene: Secondary | ICD-10-CM | POA: Diagnosis not present

## 2014-12-08 LAB — BASIC METABOLIC PANEL
BUN: 17 mg/dL (ref 6–23)
CHLORIDE: 104 meq/L (ref 96–112)
CO2: 26 meq/L (ref 19–32)
Calcium: 9.1 mg/dL (ref 8.4–10.5)
Creat: 0.58 mg/dL (ref 0.50–1.10)
Glucose, Bld: 81 mg/dL (ref 70–99)
Potassium: 4.2 mEq/L (ref 3.5–5.3)
Sodium: 139 mEq/L (ref 135–145)

## 2014-12-08 LAB — CBC WITH DIFFERENTIAL/PLATELET
BASOS ABS: 0 10*3/uL (ref 0.0–0.1)
Basophils Relative: 0 % (ref 0–1)
Eosinophils Absolute: 0.1 10*3/uL (ref 0.0–0.7)
Eosinophils Relative: 1 % (ref 0–5)
HEMATOCRIT: 41.4 % (ref 36.0–46.0)
Hemoglobin: 13.7 g/dL (ref 12.0–15.0)
LYMPHS ABS: 2.5 10*3/uL (ref 0.7–4.0)
Lymphocytes Relative: 26 % (ref 12–46)
MCH: 28.7 pg (ref 26.0–34.0)
MCHC: 33.1 g/dL (ref 30.0–36.0)
MCV: 86.6 fL (ref 78.0–100.0)
Monocytes Absolute: 0.8 10*3/uL (ref 0.1–1.0)
Monocytes Relative: 8 % (ref 3–12)
Neutro Abs: 6.2 10*3/uL (ref 1.7–7.7)
Neutrophils Relative %: 65 % (ref 43–77)
Platelets: 248 10*3/uL (ref 150–400)
RBC: 4.78 MIL/uL (ref 3.87–5.11)
RDW: 13.9 % (ref 11.5–15.5)
WBC: 9.5 10*3/uL (ref 4.0–10.5)

## 2014-12-08 MED ORDER — IOHEXOL 300 MG/ML  SOLN
100.0000 mL | Freq: Once | INTRAMUSCULAR | Status: AC | PRN
  Administered 2014-12-08: 100 mL via INTRAVENOUS

## 2014-12-08 NOTE — Progress Notes (Signed)
Subjective:    Patient ID: [name], female    DOB: 1950-11-28, 64 y.o.   MRN: [id number]  Sara Lange, MD  HPI Pain in lower abdomen for 4-5 days: Achy, associated with nausea(off and on, especially after eating, sml amount stool(formed but soft). Bm this am was fine. No dysuria or hematuria. Chronic joint pain but no worse. TAKING PENTASA.  PT DENIES FEVER, CHILLS, HEMATOCHEZIA, HEMATEMESIS, vomiting, melena, diarrhea, CHEST PAIN, SHORTNESS OF BREATH,  CHANGE IN BOWEL IN HABITS,NO SORES IN MOUTH, RASH ON LEGS, BACK PAIN, Constipation,  problems swallowing, or heartburn or indigestion.  Past Medical History  Diagnosis Date  . Gastric ulcer   . Hives   . Fibromyalgia   . Obesity   . Endometriosis     HISTORY  LEADING TO HYSTERECTOMY  . Helicobacter pylori gastritis     HISTORY  . Iron (Fe) deficiency anemia 2009    JAN 2012 NL HB & FERRITIN  . Crohn disease JULY 2011 FEDA    PERSISTENT TI ULCERS seen on CE, NO NSAID USE  . Plantar fasciitis   . Fibromyalgia 12/09/2013  . Osteoporosis 11/20/2014   Past Surgical History  Procedure Laterality Date  . Ovarian cyst removal    . Total abdominal hysterectomy w/ bilateral salpingoophorectomy    . Upper gastrointestinal endoscopy  JULY 2011 DIARRHEA/FEDA     Bx-H.PYLORI GASTRITIS, NL DUODENUM  . Ileocolonoscopy  10/14/2007    Multiple 1-3 mm terminal ileum ulcers biopsied  The ulcers seemed to only involve the last 5 cm of her terminal ileum, and 10 cm of her ileum was visualized.  Otherwise the colon  was without polyps, masses, diverticula, or AVMs.  She had a normal retroflexed view of the rectum  . Esophagogastroduodenoscopy  10/14/2007    Esophagus showed 2 cm pink tongue seen extending from the GE junction.  Biopsies obtained via cold forceps.  Otherwise the  esophagus was without erosions, mass, ulceration, or stricture / . Large hiatal hernia pouch with linear erosions, and two 3-6 mmulcers seen in the pouch.  Biopsies  obtained via cold forceps to  evaluate for H. pylori gastritis or malignancy    Allergies  Allergen Reactions  . Celebrex [Celecoxib] Hives and Itching  . Sulfonamide Derivatives     Current Outpatient Prescriptions  Medication Sig Dispense Refill  . acetaminophen (TYLENOL) 325 MG tablet Take 650 mg by mouth as needed.      . cyclobenzaprine (FLEXERIL) 5 MG tablet 1/2 - 1 po qhs prn muscle spasms    . mesalamine (PENTASA) 500 MG CR capsule Take 2 capsules (1,000 mg total) by mouth TID     . omeprazole (PRILOSEC) 20 MG capsule TAKE 1 CAPSULE (20 MG TOTAL) BY MOUTH DAILY.    Marland Kitchen alendronate 70 MG tablet  HASN'T STARTED    Family History  Problem Relation Age of Onset  . Colon polyps Neg Hx   . Colon cancer Neg Hx   . Diabetes Mother   . Cancer Father   . Diabetes Father   . Diabetes Sister   . Diabetes Brother    Review of Systems PER HPI OTHERWISE ALL SYSTEMS ARE NEGATIVE.     Objective:   Physical Exam  Constitutional: She is oriented to person, place, and time. She appears well-developed and well-nourished. No distress.  HENT:  Head: Normocephalic and atraumatic.  Mouth/Throat: Oropharynx is clear and moist. No oropharyngeal exudate.  Eyes: Pupils are equal, round, and reactive to light. No scleral  icterus.  Neck: Normal range of motion. Neck supple.  Cardiovascular: Normal rate, regular rhythm and normal heart sounds.   Pulmonary/Chest: Effort normal and breath sounds normal. No respiratory distress.  Abdominal: Soft. Bowel sounds are normal. She exhibits no distension. There is tenderness in the right lower quadrant and left lower quadrant. There is rebound. There is no guarding. No hernia.    Musculoskeletal: She exhibits no edema.  Lymphadenopathy:    She has no cervical adenopathy.  Neurological: She is alert and oriented to person, place, and time.  NO FOCAL DEFICITS   Psychiatric: She has a normal mood and affect.  Vitals reviewed.         Assessment &  Plan:

## 2014-12-08 NOTE — Progress Notes (Signed)
cc'ed to pcp °

## 2014-12-08 NOTE — Telephone Encounter (Signed)
PT CAN COME AT 130P TODAY.

## 2014-12-08 NOTE — Telephone Encounter (Signed)
Pt called again and states she never heard anything and is wanting to be seen

## 2014-12-08 NOTE — Assessment & Plan Note (Signed)
SYMPTOMS CONTROLLED/RESOLVED. 

## 2014-12-08 NOTE — Patient Instructions (Addendum)
GO TO LAB. SUBMIT URINE SAMPLE & GET YOUR BLOOD DRAWN.  GO TO RADIOLOGY TO DRINK ORAL CONTRAST AND HAVE CT SCAN OF YOUR ABDOMEN.  STAY IN RADIOLOGY UNTIL YOU TALK TO ME ABOUT YOUR RESULTS.  TALK TO DR. Wolfgang Phoenix ABOUT CHANGING FROM FOSAMAX TO ACTONEL.  FOLLOW UP IN 2 MOS.

## 2014-12-08 NOTE — Telephone Encounter (Signed)
Pt is aware and Marzetta Board is putting in the time.

## 2014-12-08 NOTE — Telephone Encounter (Signed)
Patient called back stating she is still having a lot of pain and wanted to know if [facility] could see her.

## 2014-12-08 NOTE — Progress Notes (Signed)
ON RECALL LIST  °

## 2014-12-08 NOTE — Assessment & Plan Note (Signed)
May be due to UTI, diverticulitis, or crohn's flare.  UA/CBC/BMP THEN CT TODAY WILL CALL PT WITH RESULTS FOLLOW UP IN 2 MOS.

## 2014-12-08 NOTE — Telephone Encounter (Signed)
PATIENT ON SCHEDULE FOR 12/08/14 1:30PM

## 2014-12-09 ENCOUNTER — Telehealth: Payer: Self-pay | Admitting: Gastroenterology

## 2014-12-09 ENCOUNTER — Other Ambulatory Visit: Payer: Self-pay | Admitting: Gastroenterology

## 2014-12-09 LAB — URINALYSIS, ROUTINE W REFLEX MICROSCOPIC
BILIRUBIN URINE: NEGATIVE
GLUCOSE, UA: NEGATIVE mg/dL
KETONES UR: NEGATIVE mg/dL
Leukocytes, UA: NEGATIVE
Nitrite: NEGATIVE
PROTEIN: NEGATIVE mg/dL
Specific Gravity, Urine: 1.014 (ref 1.005–1.030)
UROBILINOGEN UA: 0.2 mg/dL (ref 0.0–1.0)
pH: 6 (ref 5.0–8.0)

## 2014-12-09 LAB — URINALYSIS, MICROSCOPIC ONLY
Casts: NONE SEEN
Crystals: NONE SEEN

## 2014-12-09 NOTE — Telephone Encounter (Addendum)
SPOKE WITH PT. EXPLAINED RESULTS. SHE WILL SUBMIT UA AFTER 230P FRI 13. MAY 13: I PERSONALLY REVIEWED THE CT WITH DR. Terrace Arabia R PELVIS LA & CHRONIC CHANGES IN ILEUM. OTHERWISE NAIAP.

## 2014-12-13 NOTE — Telephone Encounter (Signed)
MAKE REFERRAL TO UROLOGY-DX: POSSIBLE URINARY RETENTION/LOWER ABDOMINAL PAIN. PLEASE CALL PT & LET HER KNOW THE REFERRAL WAS MADE.

## 2014-12-14 ENCOUNTER — Other Ambulatory Visit: Payer: Self-pay

## 2014-12-14 DIAGNOSIS — R339 Retention of urine, unspecified: Secondary | ICD-10-CM

## 2014-12-14 DIAGNOSIS — R109 Unspecified abdominal pain: Secondary | ICD-10-CM

## 2014-12-14 NOTE — Telephone Encounter (Signed)
Referral made to [facility]. Pt is aware

## 2015-01-11 ENCOUNTER — Encounter: Payer: Self-pay | Admitting: Gastroenterology

## 2015-01-16 NOTE — Telephone Encounter (Signed)
Tried to call pt, LMOM to call ( emergency contact). Also, mailed letter with the info. Forwarding to Manuela Schwartz to fax urinalysis report to Urology.

## 2015-01-16 NOTE — Telephone Encounter (Addendum)
PLEASE CALL PT. HER UA DID NOT SHOW EVIDENCE FOR A UTI. SHE SHOULD FOLLOW UP WITH UROLOGY.  FAX [place] TO UROLOGY.

## 2015-01-16 NOTE — Telephone Encounter (Signed)
UA labs faxed to [facility]

## 2015-01-16 NOTE — Telephone Encounter (Signed)
LMOM to call.

## 2015-02-17 ENCOUNTER — Other Ambulatory Visit (HOSPITAL_COMMUNITY): Payer: Self-pay

## 2015-02-21 ENCOUNTER — Encounter (HOSPITAL_COMMUNITY): Payer: BLUE CROSS/BLUE SHIELD

## 2015-02-22 ENCOUNTER — Encounter (HOSPITAL_COMMUNITY): Payer: BLUE CROSS/BLUE SHIELD

## 2015-03-14 ENCOUNTER — Ambulatory Visit: Payer: Self-pay | Admitting: Urology

## 2015-04-12 ENCOUNTER — Telehealth (HOSPITAL_COMMUNITY): Payer: Self-pay | Admitting: *Deleted

## 2015-04-12 ENCOUNTER — Other Ambulatory Visit (HOSPITAL_COMMUNITY): Payer: Self-pay | Admitting: Oncology

## 2015-04-12 DIAGNOSIS — D509 Iron deficiency anemia, unspecified: Secondary | ICD-10-CM

## 2015-04-12 NOTE — Telephone Encounter (Signed)
Yes, she can come in for labs.  Orders placed.  Muscab Brenneman 04/12/2015 6:32 PM

## 2015-04-17 ENCOUNTER — Encounter (HOSPITAL_COMMUNITY): Payer: BLUE CROSS/BLUE SHIELD | Attending: Hematology & Oncology

## 2015-04-17 DIAGNOSIS — D509 Iron deficiency anemia, unspecified: Secondary | ICD-10-CM

## 2015-04-17 LAB — CBC WITH DIFFERENTIAL/PLATELET
BASOS PCT: 0 %
Basophils Absolute: 0 10*3/uL (ref 0.0–0.1)
EOS ABS: 0.1 10*3/uL (ref 0.0–0.7)
Eosinophils Relative: 1 %
HEMATOCRIT: 38.7 % (ref 36.0–46.0)
Hemoglobin: 12.9 g/dL (ref 12.0–15.0)
Lymphocytes Relative: 28 %
Lymphs Abs: 2.2 10*3/uL (ref 0.7–4.0)
MCH: 28.2 pg (ref 26.0–34.0)
MCHC: 33.3 g/dL (ref 30.0–36.0)
MCV: 84.7 fL (ref 78.0–100.0)
MONO ABS: 0.8 10*3/uL (ref 0.1–1.0)
Monocytes Relative: 9 %
Neutro Abs: 4.9 10*3/uL (ref 1.7–7.7)
Neutrophils Relative %: 62 %
PLATELETS: 241 10*3/uL (ref 150–400)
RBC: 4.57 MIL/uL (ref 3.87–5.11)
RDW: 13.4 % (ref 11.5–15.5)
WBC: 8 10*3/uL (ref 4.0–10.5)

## 2015-04-17 LAB — IRON AND TIBC
IRON: 34 ug/dL (ref 28–170)
Saturation Ratios: 13 % (ref 10.4–31.8)
TIBC: 258 ug/dL (ref 250–450)
UIBC: 224 ug/dL

## 2015-04-17 LAB — FERRITIN: Ferritin: 121 ng/mL (ref 11–307)

## 2015-04-17 NOTE — Progress Notes (Signed)
LABS DRAWN

## 2015-04-18 ENCOUNTER — Telehealth (HOSPITAL_COMMUNITY): Payer: Self-pay

## 2015-04-18 ENCOUNTER — Telehealth (HOSPITAL_COMMUNITY): Payer: Self-pay | Admitting: *Deleted

## 2015-04-18 NOTE — Telephone Encounter (Signed)
Patient to be notified that her ferritin is not at a low enough level to obtain IV iron yet. We will recheck her in October. Patient not notified because vm picked up and I wasn't sure if patient wanted vm's left on her phone.

## 2015-04-18 NOTE — Telephone Encounter (Signed)
-----   Message from Joice Lofts, RN sent at 04/17/2015  5:46 PM EDT ----- Patient was notified that labs were wnl. She states she feels horrible and has not had iron since May of 2015. Questions if there is any way that she can receive iron.

## 2015-04-18 NOTE — Telephone Encounter (Signed)
After discussion with Robynn Pane, PA-C, patient instructed that current iron levels does not justify an iron infusion and that she should see her PCP to follow-up her fatigue.  Wishes to reschedule her next lab and office visit to late November.  States "I don't see any point in coming in October since I just got blood work done.  Want to move it to a later date in November."  Appointments rescheduled per her request to 06/27/15 for labs and 11/30 for office visit.

## 2015-05-18 ENCOUNTER — Encounter: Payer: Self-pay | Admitting: Gastroenterology

## 2015-05-18 ENCOUNTER — Ambulatory Visit (INDEPENDENT_AMBULATORY_CARE_PROVIDER_SITE_OTHER): Payer: BLUE CROSS/BLUE SHIELD | Admitting: Gastroenterology

## 2015-05-18 VITALS — BP 132/78 | HR 103 | Temp 98.1°F | Ht 63.0 in | Wt 211.8 lb

## 2015-05-18 DIAGNOSIS — D509 Iron deficiency anemia, unspecified: Secondary | ICD-10-CM | POA: Diagnosis not present

## 2015-05-18 DIAGNOSIS — K219 Gastro-esophageal reflux disease without esophagitis: Secondary | ICD-10-CM | POA: Diagnosis not present

## 2015-05-18 DIAGNOSIS — K5000 Crohn's disease of small intestine without complications: Secondary | ICD-10-CM | POA: Diagnosis not present

## 2015-05-18 MED ORDER — MESALAMINE ER 500 MG PO CPCR: 1000.0000 mg | ORAL_CAPSULE | Freq: Four times a day (QID) | ORAL | Status: DC

## 2015-05-18 MED ORDER — OMEPRAZOLE 20 MG PO CPDR: DELAYED_RELEASE_CAPSULE | ORAL | Status: DC

## 2015-05-18 NOTE — Progress Notes (Signed)
ON RECALL  °

## 2015-05-18 NOTE — Progress Notes (Signed)
CC'D TO PCP °

## 2015-05-18 NOTE — Assessment & Plan Note (Signed)
SYMPTOMS CONTROLLED/RESOLVED.  CONTINUE YOUR WEIGHT LOSS EFFORTS. LOW FAT DIET. HANDOUT GIVEN. CONTINUE OMEPRAZOLE.  TAKE 30 MINUTES PRIOR TO YOUR FIRST MEAL. FOLLOW UP IN 6-12 MOS.

## 2015-05-18 NOTE — Patient Instructions (Addendum)
FOR MORE ENERGY, TRY VITAMIN B6 AND B12 DAILY.  CONTINUE YOUR WEIGHT LOSS EFFORTS. LOSE 10 LBS.  FOLLOW A LOW FAT DIET. MEATS SHOULD BE BAKED, BROILED, OR BOILED. AVOID FRIED FOODS. SEE INFO BELOW.  CONTINUE PENTASA.  CONTINUE OMEPRAZOLE.  TAKE 30 MINUTES PRIOR TO YOUR FIRST MEAL.  FOLLOW UP IN 6-12 MOS. MERRY CHRISTMAS AND HAPPY NEW YEAR!     Low-Fat Diet BREADS, CEREALS, PASTA, RICE, DRIED PEAS, AND BEANS These products are high in carbohydrates and most are low in fat. Therefore, they can be increased in the diet as substitutes for fatty foods. They too, however, contain calories and should not be eaten in excess. Cereals can be eaten for snacks as well as for breakfast.   FRUITS AND VEGETABLES It is good to eat fruits and vegetables. Besides being sources of fiber, both are rich in vitamins and some minerals. They help you get the daily allowances of these nutrients. Fruits and vegetables can be used for snacks and desserts.  MEATS Limit lean meat, chicken, [place], and fish to no more than 6 ounces per day. Beef, Pork, and Lamb Use lean cuts of beef, pork, and lamb. Lean cuts include:  Extra-lean ground beef.  Arm roast.  Sirloin tip.  Center-cut ham.  Round steak.  Loin chops.  Rump roast.  Tenderloin.  Trim all fat off the outside of meats before cooking. It is not necessary to severely decrease the intake of red meat, but lean choices should be made. Lean meat is rich in protein and contains a highly absorbable form of iron. Premenopausal women, in particular, should avoid reducing lean red meat because this could increase the risk for low red blood cells (iron-deficiency anemia).  Chicken and [place] These are good sources of protein. The fat of poultry can be reduced by removing the skin and underlying fat layers before cooking. Chicken and [place] can be substituted for lean red meat in the diet. Poultry should not be fried or covered with high-fat sauces. Fish and  Shellfish Fish is a good source of protein. Shellfish contain cholesterol, but they usually are low in saturated fatty acids. The preparation of fish is important. Like chicken and [place], they should not be fried or covered with high-fat sauces. EGGS Egg whites contain no fat or cholesterol. They can be eaten often. Try 1 to 2 egg whites instead of whole eggs in recipes or use egg substitutes that do not contain yolk. MILK AND DAIRY PRODUCTS Use skim or 1% milk instead of 2% or whole milk. Decrease whole milk, natural, and processed cheeses. Use nonfat or low-fat (2%) cottage cheese or low-fat cheeses made from vegetable oils. Choose nonfat or low-fat (1 to 2%) yogurt. Experiment with evaporated skim milk in recipes that call for heavy cream. Substitute low-fat yogurt or low-fat cottage cheese for sour cream in dips and salad dressings. Have at least 2 servings of low-fat dairy products, such as 2 glasses of skim (or 1%) milk each day to help get your daily calcium intake. FATS AND OILS Reduce the total intake of fats, especially saturated fat. Butterfat, lard, and beef fats are high in saturated fat and cholesterol. These should be avoided as much as possible. Vegetable fats do not contain cholesterol, but certain vegetable fats, such as coconut oil, palm oil, and palm kernel oil are very high in saturated fats. These should be limited. These fats are often used in bakery goods, processed foods, popcorn, oils, and nondairy creamers. Vegetable shortenings and some  peanut butters contain hydrogenated oils, which are also saturated fats. Read the labels on these foods and check for saturated vegetable oils. Unsaturated vegetable oils and fats do not raise blood cholesterol. However, they should be limited because they are fats and are high in calories. Total fat should still be limited to 30% of your daily caloric intake. Desirable liquid vegetable oils are corn oil, cottonseed oil, olive oil, canola oil,  safflower oil, soybean oil, and sunflower oil. Peanut oil is not as good, but small amounts are acceptable. Buy a heart-healthy tub margarine that has no partially hydrogenated oils in the ingredients. Mayonnaise and salad dressings often are made from unsaturated fats, but they should also be limited because of their high calorie and fat content. Seeds, nuts, peanut butter, olives, and avocados are high in fat, but the fat is mainly the unsaturated type. These foods should be limited mainly to avoid excess calories and fat. OTHER EATING TIPS Snacks  Most sweets should be limited as snacks. They tend to be rich in calories and fats, and their caloric content outweighs their nutritional value. Some good choices in snacks are graham crackers, melba toast, soda crackers, bagels (no egg), English muffins, fruits, and vegetables. These snacks are preferable to snack crackers, [place] fries, TORTILLA CHIPS, and POTATO chips. Popcorn should be air-popped or cooked in small amounts of liquid vegetable oil. Desserts Eat fruit, low-fat yogurt, and fruit ices instead of pastries, cake, and cookies. Sherbet, angel food cake, gelatin dessert, frozen low-fat yogurt, or other frozen products that do not contain saturated fat (pure fruit juice bars, frozen ice pops) are also acceptable.  COOKING METHODS Choose those methods that use little or no fat. They include: Poaching.  Braising.  Steaming.  Grilling.  Baking.  Stir-frying.  Broiling.  Microwaving.  Foods can be cooked in a nonstick pan without added fat, or use a nonfat cooking spray in regular cookware. Limit fried foods and avoid frying in saturated fat. Add moisture to lean meats by using water, broth, cooking wines, and other nonfat or low-fat sauces along with the cooking methods mentioned above. Soups and stews should be chilled after cooking. The fat that forms on top after a few hours in the refrigerator should be skimmed off. When preparing meals,  avoid using excess salt. Salt can contribute to raising blood pressure in some people.  EATING AWAY FROM HOME Order entres, potatoes, and vegetables without sauces or butter. When meat exceeds the size of a deck of cards (3 to 4 ounces), the rest can be taken home for another meal. Choose vegetable or fruit salads and ask for low-calorie salad dressings to be served on the side. Use dressings sparingly. Limit high-fat toppings, such as bacon, crumbled eggs, cheese, sunflower seeds, and olives. Ask for heart-healthy tub margarine instead of butter.

## 2015-05-18 NOTE — Assessment & Plan Note (Addendum)
SYMPTOMS CONTROLLED/RESOLVED. C/O FATIGUE.   REVIEWED LABS FROM SEP 2016. FOR MORE ENERGY, TRY VITAMIN B6 AND B12 DAILY. CONTINUE PENTASA. FOLLOW UP IN 6-12 MOS.

## 2015-05-18 NOTE — Progress Notes (Signed)
Subjective:    Patient ID: [name], female    DOB: 12-12-1950, 64 y.o.   MRN: [id number]  [name], MD  HPI Usually TAKES PENTASA 2 BID. IF TAKES THEM ON AN EMPTY STOMACH SHE WILL HAVE DISCOMFORT. BMs: REGULAR. PT DENIES FEVER, CHILLS, HEMATOCHEZIA, nausea, vomiting, melena, diarrhea, CHEST PAIN, SHORTNESS OF BREATH,  CHANGE IN BOWEL IN HABITS, constipation, abdominal pain, problems swallowing, OR heartburn or indigestion.   Past Medical History  Diagnosis Date  . Gastric ulcer   . Hives   . Fibromyalgia   . Obesity   . Endometriosis     HISTORY  LEADING TO HYSTERECTOMY  . Helicobacter pylori gastritis     HISTORY  . Iron (Fe) deficiency anemia 2009    JAN 2012 NL HB & FERRITIN  . Crohn disease JULY 2011 FEDA    PERSISTENT TI ULCERS seen on CE, NO NSAID USE  . Plantar fasciitis   . Fibromyalgia 12/09/2013  . Osteoporosis 11/20/2014   Past Surgical History  Procedure Laterality Date  . Ovarian cyst removal    . Total abdominal hysterectomy w/ bilateral salpingoophorectomy    . Upper gastrointestinal endoscopy  JULY 2011 DIARRHEA/FEDA     Bx-H.PYLORI GASTRITIS, NL DUODENUM  . Ileocolonoscopy  10/14/2007    Multiple 1-3 mm terminal ileum ulcers biopsied  The ulcers seemed to only involve the last 5 cm of her terminal ileum, and 10 cm of her ileum was visualized.  Otherwise the colon  was without polyps, masses, diverticula, or AVMs.  She had a normal retroflexed view of the rectum  . Esophagogastroduodenoscopy  10/14/2007    Esophagus showed 2 cm pink tongue seen extending from the GE junction.  Biopsies obtained via cold forceps.  Otherwise the  esophagus was without erosions, mass, ulceration, or stricture / . Large hiatal hernia pouch with linear erosions, and two 3-6 mmulcers seen in the pouch.  Biopsies obtained via cold forceps to  evaluate for H. pylori gastritis or malignancy   Allergies  Allergen Reactions  . Celebrex [Celecoxib] Hives and Itching  .  Sulfonamide Derivatives    Current Outpatient Prescriptions  Medication Sig Dispense Refill  . acetaminophen (TYLENOL) 325 MG tablet Take 650 mg by mouth as needed.      Marland Kitchen alendronate (FOSAMAX) 70 MG tablet Take 1 tablet (70 mg total) by mouth every 7 (seven) days. Take with a full glass of water on an empty stomach.    . mesalamine (PENTASA) 500 MG CR capsule Take 2 capsules (1,000 mg total) by mouth 4 (four) times daily.    Marland Kitchen omeprazole (PRILOSEC) 20 MG capsule TAKE 1 CAPSULE (20 MG TOTAL) BY MOUTH DAILY.    Marland Kitchen       Review of Systems PER HPI OTHERWISE ALL SYSTEMS ARE NEGATIVE.    Objective:   Physical Exam  Constitutional: She is oriented to person, place, and time. She appears well-developed and well-nourished. No distress.  HENT:  Head: Normocephalic and atraumatic.  Mouth/Throat: Oropharynx is clear and moist. No oropharyngeal exudate.  Eyes: Pupils are equal, round, and reactive to light. No scleral icterus.  Neck: Normal range of motion. Neck supple.  Cardiovascular: Normal rate, regular rhythm and normal heart sounds.   Pulmonary/Chest: Effort normal and breath sounds normal. No respiratory distress.  Abdominal: Soft. Bowel sounds are normal. She exhibits no distension. There is no tenderness.  Musculoskeletal: She exhibits no edema.  Lymphadenopathy:    She has no cervical adenopathy.  Neurological: She is  alert and oriented to person, place, and time.  NO  NEW FOCAL DEFICITS  Psychiatric: She has a normal mood and affect.  Vitals reviewed.         Assessment & Plan:

## 2015-05-18 NOTE — Assessment & Plan Note (Signed)
NO BRBPR OR MELENA.  CONTINUE TO MONITOR SYMPTOMS. CALL WITH QUESTIONS OR CONCERNS.

## 2015-05-23 ENCOUNTER — Encounter (HOSPITAL_COMMUNITY): Payer: BLUE CROSS/BLUE SHIELD

## 2015-05-24 ENCOUNTER — Ambulatory Visit (HOSPITAL_COMMUNITY): Payer: Self-pay | Admitting: Oncology

## 2015-05-24 ENCOUNTER — Encounter (HOSPITAL_COMMUNITY): Payer: BLUE CROSS/BLUE SHIELD

## 2015-05-26 ENCOUNTER — Encounter (HOSPITAL_COMMUNITY): Payer: BLUE CROSS/BLUE SHIELD

## 2015-05-26 ENCOUNTER — Ambulatory Visit (HOSPITAL_COMMUNITY): Payer: Self-pay | Admitting: Hematology & Oncology

## 2015-06-27 ENCOUNTER — Encounter (HOSPITAL_COMMUNITY): Payer: BLUE CROSS/BLUE SHIELD | Attending: Hematology & Oncology

## 2015-06-27 DIAGNOSIS — D509 Iron deficiency anemia, unspecified: Secondary | ICD-10-CM | POA: Insufficient documentation

## 2015-06-27 LAB — CBC WITH DIFFERENTIAL/PLATELET
BASOS PCT: 1 %
Basophils Absolute: 0 10*3/uL (ref 0.0–0.1)
EOS ABS: 0.1 10*3/uL (ref 0.0–0.7)
Eosinophils Relative: 1 %
HEMATOCRIT: 36.9 % (ref 36.0–46.0)
Hemoglobin: 12.3 g/dL (ref 12.0–15.0)
Lymphocytes Relative: 29 %
Lymphs Abs: 2.2 10*3/uL (ref 0.7–4.0)
MCH: 28.3 pg (ref 26.0–34.0)
MCHC: 33.3 g/dL (ref 30.0–36.0)
MCV: 85 fL (ref 78.0–100.0)
MONO ABS: 0.7 10*3/uL (ref 0.1–1.0)
MONOS PCT: 9 %
NEUTROS ABS: 4.6 10*3/uL (ref 1.7–7.7)
Neutrophils Relative %: 60 %
Platelets: 243 10*3/uL (ref 150–400)
RBC: 4.34 MIL/uL (ref 3.87–5.11)
RDW: 13.7 % (ref 11.5–15.5)
WBC: 7.5 10*3/uL (ref 4.0–10.5)

## 2015-06-27 LAB — IRON AND TIBC
Iron: 28 ug/dL (ref 28–170)
Saturation Ratios: 11 % (ref 10.4–31.8)
TIBC: 253 ug/dL (ref 250–450)
UIBC: 225 ug/dL

## 2015-06-27 LAB — FERRITIN: FERRITIN: 81 ng/mL (ref 11–307)

## 2015-06-27 NOTE — Progress Notes (Unsigned)
[name] presented for labwork. Labs per MD order drawn via Peripheral Line 23 gauge needle inserted in right AC  Good blood return present. Procedure without incident.  Needle removed intact. Patient tolerated procedure well.   

## 2015-06-28 ENCOUNTER — Other Ambulatory Visit (HOSPITAL_COMMUNITY): Payer: Self-pay | Admitting: Oncology

## 2015-06-28 ENCOUNTER — Ambulatory Visit (HOSPITAL_COMMUNITY): Payer: Self-pay | Admitting: Oncology

## 2015-06-28 DIAGNOSIS — D509 Iron deficiency anemia, unspecified: Secondary | ICD-10-CM

## 2015-06-30 ENCOUNTER — Encounter (HOSPITAL_COMMUNITY): Payer: BLUE CROSS/BLUE SHIELD | Attending: Hematology & Oncology

## 2015-06-30 VITALS — BP 121/57 | HR 67 | Temp 98.0°F | Resp 18

## 2015-06-30 DIAGNOSIS — D509 Iron deficiency anemia, unspecified: Secondary | ICD-10-CM | POA: Insufficient documentation

## 2015-06-30 MED ORDER — SODIUM CHLORIDE 0.9 % IV SOLN
510.0000 mg | Freq: Once | INTRAVENOUS | Status: AC
  Administered 2015-06-30: 510 mg via INTRAVENOUS
  Filled 2015-06-30: qty 17

## 2015-06-30 MED ORDER — SODIUM CHLORIDE 0.9 % IJ SOLN
10.0000 mL | Freq: Once | INTRAMUSCULAR | Status: AC
  Administered 2015-06-30: 10 mL via INTRAVENOUS

## 2015-06-30 MED ORDER — SODIUM CHLORIDE 0.9 % IV SOLN
Freq: Once | INTRAVENOUS | Status: AC
  Administered 2015-06-30: 12:00:00 via INTRAVENOUS

## 2015-06-30 NOTE — Progress Notes (Signed)
Tolerated iron infusion well. 

## 2015-06-30 NOTE — Patient Instructions (Signed)
[facility] at [facility] Discharge Instructions  RECOMMENDATIONS MADE BY THE CONSULTANT AND ANY TEST RESULTS WILL BE SENT TO YOUR REFERRING PHYSICIAN.  Feraheme 510 mg iron infusion given today as ordered. Return as scheduled.  Thank you for choosing [facility] at [facility] to provide your oncology and hematology care.  To afford each patient quality time with our provider, please arrive at least 15 minutes before your scheduled appointment time.    You need to re-schedule your appointment should you arrive 10 or more minutes late.  We strive to give you quality time with our providers, and arriving late affects you and other patients whose appointments are after yours.  Also, if you no show three or more times for appointments you may be dismissed from the clinic at the providers discretion.     Again, thank you for choosing [facility].  Our hope is that these requests will decrease the amount of time that you wait before being seen by our physicians.       _____________________________________________________________  Should you have questions after your visit to  Cancer Center, please contact our office at [phone] between the hours of 8:30 a.m. and 4:30 p.m.  Voicemails left after 4:30 p.m. will not be returned until the following business day.  For prescription refill requests, have your pharmacy contact our office.    

## 2015-07-07 ENCOUNTER — Encounter (HOSPITAL_COMMUNITY): Payer: Self-pay | Admitting: Oncology

## 2015-07-07 ENCOUNTER — Encounter (HOSPITAL_BASED_OUTPATIENT_CLINIC_OR_DEPARTMENT_OTHER): Payer: BLUE CROSS/BLUE SHIELD | Admitting: Oncology

## 2015-07-07 VITALS — BP 118/79 | HR 104 | Temp 97.5°F | Resp 20 | Wt 211.0 lb

## 2015-07-07 DIAGNOSIS — M797 Fibromyalgia: Secondary | ICD-10-CM

## 2015-07-07 DIAGNOSIS — D509 Iron deficiency anemia, unspecified: Secondary | ICD-10-CM

## 2015-07-07 DIAGNOSIS — K5090 Crohn's disease, unspecified, without complications: Secondary | ICD-10-CM | POA: Diagnosis not present

## 2015-07-07 LAB — CBC WITH DIFFERENTIAL/PLATELET
BASOS ABS: 0 10*3/uL (ref 0.0–0.1)
BASOS PCT: 1 %
Eosinophils Absolute: 0.1 10*3/uL (ref 0.0–0.7)
Eosinophils Relative: 1 %
HEMATOCRIT: 40.3 % (ref 36.0–46.0)
HEMOGLOBIN: 13.3 g/dL (ref 12.0–15.0)
Lymphocytes Relative: 28 %
Lymphs Abs: 2.2 10*3/uL (ref 0.7–4.0)
MCH: 28.5 pg (ref 26.0–34.0)
MCHC: 33 g/dL (ref 30.0–36.0)
MCV: 86.3 fL (ref 78.0–100.0)
MONOS PCT: 9 %
Monocytes Absolute: 0.7 10*3/uL (ref 0.1–1.0)
NEUTROS ABS: 4.9 10*3/uL (ref 1.7–7.7)
NEUTROS PCT: 61 %
Platelets: 269 10*3/uL (ref 150–400)
RBC: 4.67 MIL/uL (ref 3.87–5.11)
RDW: 14 % (ref 11.5–15.5)
WBC: 7.9 10*3/uL (ref 4.0–10.5)

## 2015-07-07 LAB — IRON AND TIBC
Iron: 40 ug/dL (ref 28–170)
SATURATION RATIOS: 16 % (ref 10.4–31.8)
TIBC: 251 ug/dL (ref 250–450)
UIBC: 211 ug/dL

## 2015-07-07 LAB — VITAMIN B12: VITAMIN B 12: 178 pg/mL — AB (ref 180–914)

## 2015-07-07 LAB — FERRITIN: FERRITIN: 316 ng/mL — AB (ref 11–307)

## 2015-07-07 NOTE — Patient Instructions (Addendum)
[place] at [facility] Discharge Instructions  RECOMMENDATIONS MADE BY THE CONSULTANT AND ANY TEST RESULTS WILL BE SENT TO YOUR REFERRING PHYSICIAN.  Exam completed by Kirby Crigler PA-C Will check some additional labs today to check you labs today to see your response to the iron. If any concerns we will call you.   Follow-up: Labs and office visit in 6 months. Please call the clinic if you have any questions or concerns   Thank you for choosing [place] at [facility] to provide your oncology and hematology care.  To afford each patient quality time with our provider, please arrive at least 15 minutes before your scheduled appointment time.    You need to re-schedule your appointment should you arrive 10 or more minutes late.  We strive to give you quality time with our providers, and arriving late affects you and other patients whose appointments are after yours.  Also, if you no show three or more times for appointments you may be dismissed from the clinic at the providers discretion.     Again, thank you for choosing [facility].  Our hope is that these requests will decrease the amount of time that you wait before being seen by our physicians.       _____________________________________________________________  Should you have questions after your visit to [facility], please contact our office at [phone] between the hours of 8:30 a.m. and 4:30 p.m.  Voicemails left after 4:30 p.m. will not be returned until the following business day.  For prescription refill requests, have your pharmacy contact our office.

## 2015-07-07 NOTE — Assessment & Plan Note (Signed)
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  Labs today: CBC diff, iron/TIBC, ferritin.  I understand that she received IV Feraheme approximately 1 week ago.  I will use this opportunity to evaluate her response to Feraheme.  I have added a B12 level at the patient's request.  She asks about B12 injections and she is educated on the role of these.  If her B12 level is WNL, I cannot justify B12 replacement IM.  She has previously been advised by Dr. Oneida Alar that she may take B6/B12 vitamins.  Labs in 6 months: CBC diff, iron/TIBC, ferritin with the understanding that she will call in the interim for iron studies if needed based upon her symptoms.  Will administer IV iron when indicated.   Return in 6 months for follow-up.

## 2015-07-07 NOTE — Progress Notes (Signed)
Sara Lange, MD [place] [id number]  Iron deficiency anemia - Plan: Vitamin B12, CBC with Differential, Iron and TIBC, Ferritin, CBC with Differential, Iron and TIBC, Ferritin, Vitamin B12, CBC with Differential, Iron and TIBC, Ferritin  CURRENT THERAPY: Last IV Feraheme infusion was on 12/09/2013  INTERVAL HISTORY: [name] 64 y.o. female returns for followup of mixed anemia with contributions from malabsorption and chronic blood loss in the setting of Crohn's disease.  I personally reviewed and went over laboratory results with the patient.  The results are noted within this dictation.  Her iron studies are stable and Hgb is up nearly another gram.  She complains of chronic fatigue.  I am not convinced that this is iron deficiency anemia induced at this time.  Her clinical situation is complicated by co-morbidities including fibromyalgia.    She notes that she wants to do things at home, but does not have the physical strength to do these.  She would rather rest.  She is frustrated by repeated lab work only to discover she is not a candidate for IV iron infusion based upon her ferritin level.  Asa result, I have made a compromise regarding her future labs testing.  She has been very reliable regarding her appointments.  Past Medical History  Diagnosis Date  . Gastric ulcer   . Hives   . Fibromyalgia   . Obesity   . Endometriosis     HISTORY  LEADING TO HYSTERECTOMY  . Helicobacter pylori gastritis     HISTORY  . Iron (Fe) deficiency anemia 2009    JAN 2012 NL HB & FERRITIN  . Crohn disease ([place]) JULY 2011 FEDA    PERSISTENT TI ULCERS seen on CE, NO NSAID USE  . Plantar fasciitis   . Fibromyalgia 12/09/2013  . Osteoporosis 11/20/2014    has Iron deficiency anemia; Crohn's ileitis ([place]); Fibromyalgia; Impaired fasting glucose; GERD (gastroesophageal reflux disease); Chronic back pain; Osteoporosis; and Acute bilateral lower abdominal pain  on her problem list.     is allergic to celebrex and sulfonamide derivatives.  [name] had no medications administered during this visit.  Past Surgical History  Procedure Laterality Date  . Ovarian cyst removal    . Total abdominal hysterectomy w/ bilateral salpingoophorectomy    . Upper gastrointestinal endoscopy  JULY 2011 DIARRHEA/FEDA     Bx-H.PYLORI GASTRITIS, NL DUODENUM  . Ileocolonoscopy  10/14/2007    Multiple 1-3 mm terminal ileum ulcers biopsied  The ulcers seemed to only involve the last 5 cm of her terminal ileum, and 10 cm of her ileum was visualized.  Otherwise the colon  was without polyps, masses, diverticula, or AVMs.  She had a normal retroflexed view of the rectum  . Esophagogastroduodenoscopy  10/14/2007    Esophagus showed 2 cm pink tongue seen extending from the GE junction.  Biopsies obtained via cold forceps.  Otherwise the  esophagus was without erosions, mass, ulceration, or stricture / . Large hiatal hernia pouch with linear erosions, and two 3-6 mmulcers seen in the pouch.  Biopsies obtained via cold forceps to  evaluate for H. pylori gastritis or malignancy    Denies any headaches, dizziness, double vision, fevers, chills, night sweats, nausea, vomiting, diarrhea, constipation, chest pain, heart palpitations, shortness of breath, blood in stool, black tarry stool, urinary pain, urinary burning, urinary frequency, hematuria.   PHYSICAL EXAMINATION  ECOG PERFORMANCE STATUS: 1 - Symptomatic but completely ambulatory  Filed  Vitals:   07/07/15 1400  BP: 118/79  Pulse: 104  Temp: 97.5 F (36.4 C)  Resp: 20    GENERAL:alert, no distress, well nourished, well developed, comfortable, cooperative, obese and unaccompanied SKIN: skin color, texture, turgor are normal, no rashes or significant lesions HEAD: Normocephalic, No masses, lesions, tenderness or abnormalities EYES: normal, PERRLA, EOMI, Conjunctiva are pink and non-injected EARS: External ears  normal OROPHARYNX:lips, buccal mucosa, and tongue normal and mucous membranes are moist  NECK: supple, no stridor, non-tender, trachea midline LYMPH:  no palpable lymphadenopathy BREAST:not examined LUNGS: clear to auscultation and percussion HEART: regular rate & rhythm, no murmurs, no gallops, S1 normal and S2 normal ABDOMEN:abdomen soft, non-tender and normal bowel sounds BACK: Back symmetric, no curvature., No CVA tenderness EXTREMITIES:less then 2 second capillary refill, no joint deformities, effusion, or inflammation, no edema, no skin discoloration, no clubbing, no cyanosis  NEURO: alert & oriented x 3 with fluent speech, no focal motor/sensory deficits, gait normal   LABORATORY DATA: CBC    Component Value Date/Time   WBC 7.5 06/27/2015 1512   RBC 4.34 06/27/2015 1512   HGB 12.3 06/27/2015 1512   HCT 36.9 06/27/2015 1512   PLT 243 06/27/2015 1512   MCV 85.0 06/27/2015 1512   MCH 28.3 06/27/2015 1512   MCHC 33.3 06/27/2015 1512   RDW 13.7 06/27/2015 1512   LYMPHSABS 2.2 06/27/2015 1512   MONOABS 0.7 06/27/2015 1512   EOSABS 0.1 06/27/2015 1512   BASOSABS 0.0 06/27/2015 1512      Chemistry      Component Value Date/Time   NA 139 12/08/2014 1418   K 4.2 12/08/2014 1418   CL 104 12/08/2014 1418   CO2 26 12/08/2014 1418   BUN 17 12/08/2014 1418   CREATININE 0.58 12/08/2014 1418      Component Value Date/Time   CALCIUM 9.1 12/08/2014 1418   ALKPHOS 92 03/09/2014 0711   AST 14 03/09/2014 0711   ALT 18 03/09/2014 0711   BILITOT 0.6 03/09/2014 0711     Lab Results  Component Value Date   IRON 28 06/27/2015   TIBC 253 06/27/2015   FERRITIN 81 06/27/2015     RADIOGRAPHIC STUDIES:  No results found.  ASSESSMENT AND PLAN:  Iron deficiency anemia Mixed anemia with contributions from chronic blood loss and malabsorption, requiring intermittent IV iron.  Labs today: CBC diff, iron/TIBC, ferritin.  I understand that she received IV Feraheme approximately 1  week ago.  I will use this opportunity to evaluate her response to Feraheme.  I have added a B12 level at the patient's request.  She asks about B12 injections and she is educated on the role of these.  If her B12 level is WNL, I cannot justify B12 replacement IM.  She has previously been advised by Dr. Oneida Alar that she may take B6/B12 vitamins.  Labs in 6 months: CBC diff, iron/TIBC, ferritin with the understanding that she will call in the interim for iron studies if needed based upon her symptoms.  Will administer IV iron when indicated.   Return in 6 months for follow-up.  THERAPY PLAN:  We will continue to monitor labs and iron studies.  All questions were answered. The patient knows to call the clinic with any problems, questions or concerns. We can certainly see the patient much sooner if necessary.  Patient and plan discussed with Dr. Ancil Linsey and she is in agreement with the aforementioned.   This note is electronically signed by: Robynn Pane 07/07/2015 5:24 PM

## 2015-07-09 ENCOUNTER — Encounter (HOSPITAL_COMMUNITY): Payer: Self-pay | Admitting: Oncology

## 2015-07-09 ENCOUNTER — Other Ambulatory Visit (HOSPITAL_COMMUNITY): Payer: Self-pay | Admitting: Oncology

## 2015-07-09 DIAGNOSIS — E538 Deficiency of other specified B group vitamins: Secondary | ICD-10-CM

## 2015-07-09 HISTORY — DX: Deficiency of other specified B group vitamins: E53.8

## 2015-07-12 ENCOUNTER — Telehealth (HOSPITAL_COMMUNITY): Payer: Self-pay | Admitting: Emergency Medicine

## 2015-07-12 NOTE — Telephone Encounter (Signed)
Pt notified, weekly b12 x4 set up and lab work prior to first injection

## 2015-07-18 ENCOUNTER — Encounter (HOSPITAL_COMMUNITY): Payer: BLUE CROSS/BLUE SHIELD

## 2015-07-18 ENCOUNTER — Encounter (HOSPITAL_COMMUNITY): Payer: Self-pay

## 2015-07-18 ENCOUNTER — Encounter (HOSPITAL_BASED_OUTPATIENT_CLINIC_OR_DEPARTMENT_OTHER): Payer: BLUE CROSS/BLUE SHIELD

## 2015-07-18 DIAGNOSIS — E538 Deficiency of other specified B group vitamins: Secondary | ICD-10-CM | POA: Diagnosis not present

## 2015-07-18 DIAGNOSIS — D509 Iron deficiency anemia, unspecified: Secondary | ICD-10-CM | POA: Diagnosis not present

## 2015-07-18 MED ORDER — CYANOCOBALAMIN 1000 MCG/ML IJ SOLN
INTRAMUSCULAR | Status: AC
  Filled 2015-07-18: qty 1

## 2015-07-18 MED ORDER — CYANOCOBALAMIN 1000 MCG/ML IJ SOLN
1000.0000 ug | Freq: Once | INTRAMUSCULAR | Status: AC
  Administered 2015-07-18: 1000 ug via INTRAMUSCULAR

## 2015-07-18 NOTE — Progress Notes (Signed)
..  [name]'s reason for visit today are for labs as scheduled per MD orders.  Venipuncture performed with a 23 gauge butterfly needle to R Antecubital.  [name] tolerated venipuncture well and without incident; questions were answered and patient was discharged.

## 2015-07-18 NOTE — Patient Instructions (Signed)
[place] at [facility] Discharge Instructions  RECOMMENDATIONS MADE BY THE CONSULTANT AND ANY TEST RESULTS WILL BE SENT TO YOUR REFERRING PHYSICIAN.  Lab work and B12 injection today. Return weekly for B12 injections as scheduled, then monthly injections.   Thank you for choosing [place] at [facility] to provide your oncology and hematology care.  To afford each patient quality time with our provider, please arrive at least 15 minutes before your scheduled appointment time.    You need to re-schedule your appointment should you arrive 10 or more minutes late.  We strive to give you quality time with our providers, and arriving late affects you and other patients whose appointments are after yours.  Also, if you no show three or more times for appointments you may be dismissed from the clinic at the providers discretion.     Again, thank you for choosing [facility].  Our hope is that these requests will decrease the amount of time that you wait before being seen by our physicians.       _____________________________________________________________  Should you have questions after your visit to [facility], please contact our office at [phone] between the hours of 8:30 a.m. and 4:30 p.m.  Voicemails left after 4:30 p.m. will not be returned until the following business day.  For prescription refill requests, have your pharmacy contact our office.

## 2015-07-18 NOTE — Progress Notes (Signed)
1445:  [name] presents today for injection per the provider's orders.  B12 administration without incident; see MAR for injection details.  Patient tolerated procedure well and without incident.  No questions or complaints noted at this time.

## 2015-07-19 LAB — INTRINSIC FACTOR ANTIBODIES: Intrinsic Factor: 1.1 AU/mL (ref 0.0–1.1)

## 2015-07-19 LAB — ANTI-PARIETAL ANTIBODY: PARIETAL CELL ANTIBODY-IGG: 2.4 U (ref 0.0–20.0)

## 2015-07-25 ENCOUNTER — Ambulatory Visit (HOSPITAL_COMMUNITY): Payer: Self-pay

## 2015-07-26 ENCOUNTER — Ambulatory Visit (HOSPITAL_COMMUNITY): Payer: Self-pay

## 2015-08-01 ENCOUNTER — Encounter (HOSPITAL_COMMUNITY): Payer: BLUE CROSS/BLUE SHIELD | Attending: Hematology & Oncology

## 2015-08-01 ENCOUNTER — Encounter (HOSPITAL_COMMUNITY): Payer: Self-pay

## 2015-08-01 VITALS — BP 138/73 | HR 86 | Temp 98.2°F | Resp 18

## 2015-08-01 DIAGNOSIS — E538 Deficiency of other specified B group vitamins: Secondary | ICD-10-CM

## 2015-08-01 DIAGNOSIS — D509 Iron deficiency anemia, unspecified: Secondary | ICD-10-CM | POA: Insufficient documentation

## 2015-08-01 MED ORDER — CYANOCOBALAMIN 1000 MCG/ML IJ SOLN
INTRAMUSCULAR | Status: AC
  Filled 2015-08-01: qty 1

## 2015-08-01 MED ORDER — CYANOCOBALAMIN 1000 MCG/ML IJ SOLN
1000.0000 ug | Freq: Once | INTRAMUSCULAR | Status: AC
  Administered 2015-08-01: 1000 ug via INTRAMUSCULAR

## 2015-08-01 NOTE — Progress Notes (Signed)
[name] presents today for injection per the provider's orders.  B12 administration without incident; see MAR for injection details.  Patient tolerated procedure well and without incident.  No questions or complaints noted at this time.

## 2015-08-01 NOTE — Patient Instructions (Signed)
[facility] at [facility] Discharge Instructions  RECOMMENDATIONS MADE BY THE CONSULTANT AND ANY TEST RESULTS WILL BE SENT TO YOUR REFERRING PHYSICIAN.  B12 injection today. Return as scheduled for injections. Return as scheduled for lab work and office visit.  Thank you for choosing [facility] at [facility] to provide your oncology and hematology care.  To afford each patient quality time with our provider, please arrive at least 15 minutes before your scheduled appointment time.    You need to re-schedule your appointment should you arrive 10 or more minutes late.  We strive to give you quality time with our providers, and arriving late affects you and other patients whose appointments are after yours.  Also, if you no show three or more times for appointments you may be dismissed from the clinic at the providers discretion.     Again, thank you for choosing [facility].  Our hope is that these requests will decrease the amount of time that you wait before being seen by our physicians.       _____________________________________________________________  Should you have questions after your visit to [facility], please contact our office at [phone] between the hours of 8:30 a.m. and 4:30 p.m.  Voicemails left after 4:30 p.m. will not be returned until the following business day.  For prescription refill requests, have your pharmacy contact our office.    

## 2015-08-08 ENCOUNTER — Other Ambulatory Visit (HOSPITAL_COMMUNITY): Payer: Self-pay | Admitting: Oncology

## 2015-08-08 ENCOUNTER — Encounter (HOSPITAL_COMMUNITY): Payer: Self-pay

## 2015-08-08 ENCOUNTER — Encounter (HOSPITAL_BASED_OUTPATIENT_CLINIC_OR_DEPARTMENT_OTHER): Payer: BLUE CROSS/BLUE SHIELD

## 2015-08-08 VITALS — BP 115/69 | HR 92 | Temp 98.1°F | Resp 18

## 2015-08-08 DIAGNOSIS — E538 Deficiency of other specified B group vitamins: Secondary | ICD-10-CM

## 2015-08-08 MED ORDER — CYANOCOBALAMIN 1000 MCG/ML IJ SOLN
1000.0000 ug | Freq: Once | INTRAMUSCULAR | Status: AC
  Administered 2015-08-08: 1000 ug via INTRAMUSCULAR

## 2015-08-08 MED ORDER — CYANOCOBALAMIN 1000 MCG/ML IJ SOLN
INTRAMUSCULAR | Status: AC
  Filled 2015-08-08: qty 1

## 2015-08-08 NOTE — Patient Instructions (Signed)
..  [place] at [facility] Discharge Instructions  RECOMMENDATIONS MADE BY THE CONSULTANT AND ANY TEST RESULTS WILL BE SENT TO YOUR REFERRING PHYSICIAN.  B12 injection today This can be causing your headache Your antibodies were with in normal limits so you can do the oral b12  B12 (Cyanocobalamin,) take [phone] mcg a day.  You can do the sublingual or the pills   Thank you for choosing [place] at [facility] to provide your oncology and hematology care.  To afford each patient quality time with our provider, please arrive at least 15 minutes before your scheduled appointment time.    You need to re-schedule your appointment should you arrive 10 or more minutes late.  We strive to give you quality time with our providers, and arriving late affects you and other patients whose appointments are after yours.  Also, if you no show three or more times for appointments you may be dismissed from the clinic at the providers discretion.     Again, thank you for choosing [facility].  Our hope is that these requests will decrease the amount of time that you wait before being seen by our physicians.       _____________________________________________________________  Should you have questions after your visit to [facility], please contact our office at [phone] between the hours of 8:30 a.m. and 4:30 p.m.  Voicemails left after 4:30 p.m. will not be returned until the following business day.  For prescription refill requests, have your pharmacy contact our office.

## 2015-08-08 NOTE — Progress Notes (Signed)
..  [name] presents today for injection per the provider's orders.  b12 administration without incident; see MAR for injection details.  Patient tolerated procedure well and without incident.  No questions or complaints noted at this time. Patient c/o headache and dizziness on both occasions after b12 injection. Discussed with T. Kefalas PA_C and this is a documented side effect. Explained this to patient and discussed that lab values are such that she can switch to po b12 at this time. She chooses to continue with th 4th injection as ordered then switch to oral.

## 2015-08-08 NOTE — Progress Notes (Signed)
Patient is here today for her 3/4 weekly B12 injections.  She reports to nursing a headache since starting B12 injections.  This is a potential side effect of B12 injections.  Therefore, We will give injection today.  She can take OTC medication for this headache.  It would be reasonable, given her negative antibody testing to cancel the 4th injection and transition to PO B12 replacement.  She can take 500- 1000 mcg daily PO.  We'll recheck a B12 level in 6 weeks.  Order is in.  Slaton Reaser, PA-C 08/08/2015 3:02 PM

## 2015-08-15 ENCOUNTER — Ambulatory Visit (HOSPITAL_COMMUNITY): Payer: Self-pay

## 2015-08-21 ENCOUNTER — Telehealth: Payer: Self-pay | Admitting: Gastroenterology

## 2015-08-21 NOTE — Telephone Encounter (Signed)
[name] WAS TOLD THAT HER REFILL IS NOT AVAILABLE, EXPIRED.  PENTASA

## 2015-08-21 NOTE — Telephone Encounter (Signed)
I called pharmacist and pt had refills left and she takes Pentasa 500 mg  2 tablets 4 times a day and he will fill it for her and pt is aware.

## 2015-08-21 NOTE — Telephone Encounter (Signed)
Sending FYI to Dr. Oneida Alar.

## 2015-08-22 NOTE — Telephone Encounter (Signed)
REVIEWED-NO ADDITIONAL RECOMMENDATIONS. 

## 2015-09-04 ENCOUNTER — Ambulatory Visit (INDEPENDENT_AMBULATORY_CARE_PROVIDER_SITE_OTHER): Payer: BLUE CROSS/BLUE SHIELD | Admitting: Family Medicine

## 2015-09-04 ENCOUNTER — Encounter: Payer: Self-pay | Admitting: Family Medicine

## 2015-09-04 ENCOUNTER — Ambulatory Visit (HOSPITAL_COMMUNITY)
Admission: RE | Admit: 2015-09-04 | Discharge: 2015-09-04 | Disposition: A | Discharge status: Home / Self Care | Payer: BLUE CROSS/BLUE SHIELD | Source: Ambulatory Visit | Attending: Family Medicine | Admitting: Family Medicine

## 2015-09-04 ENCOUNTER — Other Ambulatory Visit: Payer: Self-pay | Admitting: Family Medicine

## 2015-09-04 VITALS — BP 110/80 | Temp 97.8°F | Ht 64.0 in | Wt 212.0 lb

## 2015-09-04 DIAGNOSIS — R51 Headache: Secondary | ICD-10-CM | POA: Insufficient documentation

## 2015-09-04 DIAGNOSIS — R519 Headache, unspecified: Secondary | ICD-10-CM

## 2015-09-04 DIAGNOSIS — R42 Dizziness and giddiness: Secondary | ICD-10-CM | POA: Diagnosis not present

## 2015-09-04 DIAGNOSIS — G44201 Tension-type headache, unspecified, intractable: Secondary | ICD-10-CM

## 2015-09-04 NOTE — Progress Notes (Signed)
   Subjective:    Patient ID: [name], female    DOB: [birth year], 65 y.o.   MRN: [id number]  Headache  This is a new problem. The current episode started in the past 7 days. The problem has been unchanged. The pain does not radiate. The quality of the pain is described as aching. Associated symptoms include dizziness. Pertinent negatives include no abdominal pain, coughing, fever, numbness or weakness. Nothing aggravates the symptoms. She has tried nothing for the symptoms. The treatment provided no relief.   She relates that she did have headaches when she was younger but has not had them in years. She states that she was waking up at her usual time around 7:00 on Sunday morning them was struck with severe pain in the right frontal lobe region of her head area she describes the pain as severe pain with nausea denied double vision she states the pain causes her to get out of bed hold her head into get at least 5-10 minutes to go away. Since then she has had intermittent vertigo and she also states that she's had a dull ache in her head and doesn't feel right she has had intermittent nausea but no unilateral numbness or weakness she denies any neck stiffness yesterday but did relate some neck pain today. She denies high fever chills sweats. She denies double vision. She did relate some photophobia and phonophobia.   Review of Systems  Constitutional: Negative for fever and fatigue.  HENT: Negative for congestion.   Respiratory: Negative for cough.   Cardiovascular: Negative for chest pain.  Gastrointestinal: Negative for abdominal pain.  Musculoskeletal: Positive for arthralgias.  Neurological: Positive for dizziness and headaches. Negative for tremors, facial asymmetry, weakness, light-headedness and numbness.       Objective:   Physical Exam  It was difficult to see the optic disc because of cataracts but the best I could tell it appears sharp pupils responsive to light EOMI finger to  nose normal Romberg negative lungs are clear hearts regular no unilateral numbness or weakness      Assessment & Plan:  Given the severe onset of a headache along with this essentially being a new headache at an older age it is concerning for the possibility of aneurysm or stroke. I highly recommend any emergent CT scan. In addition to this if the CT scan is negative we will be setting up patient with neurology.  On further follow-up CAT scan came back negative. I did speak with the patient in radiology. Ace to face time with the patient including explanation of results and discussion of her issue was 25 minutes. Greater than half was in discussion.

## 2015-09-05 ENCOUNTER — Encounter: Payer: Self-pay | Admitting: Family Medicine

## 2015-09-05 ENCOUNTER — Telehealth: Payer: Self-pay | Admitting: *Deleted

## 2015-09-05 NOTE — Telephone Encounter (Signed)
LVM on cell number/home number to call to schedule NP appt tomorrow w/ Dr Jaynee Eagles. We are fitting her in as requested from Dr Wolfgang Phoenix. Can place in either 730 or 12pm. I have slots blocked right now as requested by Dr Jaynee Eagles.

## 2015-09-06 ENCOUNTER — Telehealth: Payer: Self-pay | Admitting: Neurology

## 2015-09-06 ENCOUNTER — Ambulatory Visit (INDEPENDENT_AMBULATORY_CARE_PROVIDER_SITE_OTHER): Payer: BLUE CROSS/BLUE SHIELD | Admitting: Neurology

## 2015-09-06 ENCOUNTER — Encounter: Payer: Self-pay | Admitting: Neurology

## 2015-09-06 VITALS — BP 150/81 | HR 69 | Ht 64.0 in | Wt 210.0 lb

## 2015-09-06 DIAGNOSIS — R51 Headache: Secondary | ICD-10-CM | POA: Diagnosis not present

## 2015-09-06 DIAGNOSIS — H539 Unspecified visual disturbance: Secondary | ICD-10-CM

## 2015-09-06 DIAGNOSIS — R42 Dizziness and giddiness: Secondary | ICD-10-CM

## 2015-09-06 DIAGNOSIS — G4453 Primary thunderclap headache: Secondary | ICD-10-CM | POA: Diagnosis not present

## 2015-09-06 DIAGNOSIS — G4489 Other headache syndrome: Secondary | ICD-10-CM | POA: Diagnosis not present

## 2015-09-06 DIAGNOSIS — R519 Headache, unspecified: Secondary | ICD-10-CM

## 2015-09-06 MED ORDER — ALPRAZOLAM 0.5 MG PO TABS: ORAL_TABLET | ORAL | Status: DC

## 2015-09-06 NOTE — Patient Instructions (Signed)
Remember to drink plenty of fluid, eat healthy meals and do not skip any meals. Try to eat protein with a every meal and eat a healthy snack such as fruit or nuts in between meals. Try to keep a regular sleep-wake schedule and try to exercise daily, particularly in the form of walking, 20-30 minutes a day, if you can.   As far as diagnostic testing: mri of the brain and labs  I would like to see you back after imaging if needed , sooner if we need to. Please call us with any interim questions, concerns, problems, updates or refill requests.   Our phone number is [phone]. We also have an after hours call service for urgent matters and there is a physician on-call for urgent questions. For any emergencies you know to call 911 or go to the nearest emergency room

## 2015-09-06 NOTE — Telephone Encounter (Signed)
There is no other test that take the place of MRIs. If she has another episode she should proceed to the ED and they may image her at that time. She understands the risks if she does not have the imaging, discussed with her at appoitment thanks.

## 2015-09-06 NOTE — Telephone Encounter (Signed)
Spoke with patient in regards to scheduling her MRI [place]. Patient explained that she can not afford her copay of $2,289.26 for both scans. Patient would like to know if there Is another test that she can have that would be more affordable. Patient can be reached @ [phone]

## 2015-09-06 NOTE — Progress Notes (Signed)
[facility]    Provider:  Dr Jaynee Eagles Referring Provider: Kathyrn Drown, MD Primary Care Physician:  [name], MD  CC:  Headache  HPI:  [name] is a 65 y.o. female here as a referral from Dr. Wolfgang Phoenix for headache. Past medical history of remote migraines, fibromyalgia, obesity, anemia, Crohn's disease, B12 deficiency. She woke up and used the bathroom Sunday and when she laid back down she had an excruciating pain al over the had with vertigo and dizziness. She had a little nausea but no vomiting, no cognitive or vision changes, no weakness or focal neurologic symptoms with the headache. She stayed at home. She had nausea. About 10 years back she had dizziness and had to call paramedics and she went to Dr. Wolfgang Phoenix and was given antivert but she did not have the pain in her head back then. She has a remote history of possibly migraines with blurred vision and visual auras and light sensitivity having to sleep but it has been over 20 years since having a headaches. A month ago she had B12 shots and some mild dizzy head. The headache lasted for a few minutes and it was 10/10 severe. She can't remember any associated symptoms because it was so excruciating. All the symptoms have gone away since then. There was not anything different that night, no illnesses, no fevers, no neck pain. Since Sunday no recurrence of symptoms. She has some muscular neck pain but denies meningismus. No sexual activity, no exertion, no trauma or any inciting factors to the headache. No jaw claudication or temple pain. She does say her head feels "funky", doesn't feel right but denies headache. She snores at night. She is not excesisvely tired during the day.   Reviewed notes, labs and imaging from outside physicians, which showed:  CT of the head (personally reviewed images and agree with the following): No acute intracranial abnormality. Specifically, no hemorrhage, hydrocephalus, mass lesion, acute  infarction, or significant intracranial injury. No acute calvarial abnormality. Visualized paranasal sinuses and mastoids clear. Orbital soft tissues unremarkable.  IMPRESSION: Normal study.  Review of Systems: Patient complains of symptoms per HPI as well as the following symptoms: Fatigue, dizziness, headache. Pertinent negatives per HPI. All others negative.   Social History   Social History  . Marital Status: Married    Spouse Name: Alvester Chou   . Number of Children: 3  . Years of Education: 12   Occupational History  . Retired    Social History Main Topics  . Smoking status: Never Smoker   . Smokeless tobacco: Never Used  . Alcohol Use: No  . Drug Use: No  . Sexual Activity: Not on file   Other Topics Concern  . Not on file   Social History Narrative   Lives w/ husband   Caffeine use: Drinks 1-2 cups coffee per day    Family History  Problem Relation Age of Onset  . Colon polyps Neg Hx   . Colon cancer Neg Hx   . Stroke Neg Hx   . Diabetes Mother   . Cancer Father   . Diabetes Father   . Diabetes Sister   . Diabetes Brother     Past Medical History  Diagnosis Date  . Gastric ulcer   . Hives   . Fibromyalgia   . Obesity   . Endometriosis     HISTORY  LEADING TO HYSTERECTOMY  . Helicobacter pylori gastritis     HISTORY  . Iron (Fe) deficiency anemia  2009    JAN 2012 NL HB & FERRITIN  . Crohn disease ([place]) JULY 2011 FEDA    PERSISTENT TI ULCERS seen on CE, NO NSAID USE  . Plantar fasciitis   . Fibromyalgia 12/09/2013  . Osteoporosis 11/20/2014  . B12 deficiency 07/09/2015  . Headache     Past Surgical History  Procedure Laterality Date  . Ovarian cyst removal    . Total abdominal hysterectomy w/ bilateral salpingoophorectomy    . Upper gastrointestinal endoscopy  JULY 2011 DIARRHEA/FEDA     Bx-H.PYLORI GASTRITIS, NL DUODENUM  . Ileocolonoscopy  10/14/2007    Multiple 1-3 mm terminal ileum ulcers biopsied  The ulcers seemed to only involve the  last 5 cm of her terminal ileum, and 10 cm of her ileum was visualized.  Otherwise the colon  was without polyps, masses, diverticula, or AVMs.  She had a normal retroflexed view of the rectum  . Esophagogastroduodenoscopy  10/14/2007    Esophagus showed 2 cm pink tongue seen extending from the GE junction.  Biopsies obtained via cold forceps.  Otherwise the  esophagus was without erosions, mass, ulceration, or stricture / . Large hiatal hernia pouch with linear erosions, and two 3-6 mmulcers seen in the pouch.  Biopsies obtained via cold forceps to  evaluate for H. pylori gastritis or malignancy    Current Outpatient Prescriptions  Medication Sig Dispense Refill  . acetaminophen (TYLENOL) 325 MG tablet Take 650 mg by mouth as needed.      . mesalamine (PENTASA) 500 MG CR capsule Take 2 capsules (1,000 mg total) by mouth 4 (four) times daily. 240 capsule 11  . omeprazole (PRILOSEC) 20 MG capsule TAKE 1 CAPSULE (20 MG TOTAL) BY MOUTH DAILY. 31 capsule 11  . alendronate (FOSAMAX) 70 MG tablet Take 1 tablet (70 mg total) by mouth every 7 (seven) days. Take with a full glass of water on an empty stomach. (Patient not taking: Reported on 09/04/2015) 4 tablet 11  . ALPRAZolam (XANAX) 0.5 MG tablet Take 1-2 before MRi. If needed may repeat while MRI. 5 tablet 0   No current facility-administered medications for this visit.    Allergies as of 09/06/2015 - Review Complete 09/06/2015  Allergen Reaction Noted  . Celebrex [celecoxib] Hives and Itching 01/07/2011  . Sulfonamide derivatives      Vitals: BP 150/81 mmHg  Pulse 69  Ht 5' 4"  (1.626 m)  Wt 210 lb (95.255 kg)  BMI 36.03 kg/m2 Last Weight:  Wt Readings from Last 1 Encounters:  09/06/15 210 lb (95.255 kg)   Last Height:   Ht Readings from Last 1 Encounters:  09/06/15 5' 4"  (1.626 m)    Physical exam: Exam: Gen: NAD, conversant, well nourised, obese, well groomed                     CV: RRR, no MRG. No Carotid Bruits. No peripheral  edema, warm, nontender Eyes: Conjunctivae clear without exudates or hemorrhage  Neuro: Detailed Neurologic Exam  Speech:    Speech is normal; fluent and spontaneous with normal comprehension.  Cognition:    The patient is oriented to person, place, and time;     recent and remote memory intact;     language fluent;     normal attention, concentration,     fund of knowledge Cranial Nerves:    The pupils are equal, round, and reactive to light. The fundi are normal and spontaneous venous pulsations are present. Visual fields are full to finger  confrontation. Extraocular movements are intact. Trigeminal sensation is intact and the muscles of mastication are normal. The face is symmetric. The palate elevates in the midline. Hearing intact. Voice is normal. Shoulder shrug is normal. The tongue has normal motion without fasciculations.   Coordination:    Normal finger to nose and heel to shin. Normal rapid alternating movements.   Gait:    Heel-toe and tandem gait are normal.   Motor Observation:    No asymmetry, no atrophy, and no involuntary movements noted. Tone:    Normal muscle tone.    Posture:    Posture is normal. normal erect    Strength:    Strength is V/V in the upper and lower limbs.      Sensation: intact to LT     Reflex Exam:  DTR's:    Deep tendon reflexes in the upper and lower extremities are normal bilaterally.   Toes:    The toes are downgoing bilaterally.   Clonus:    Clonus is absent.      Assessment/Plan:  65 year old patient with acute onset severe headache with nausea and dizziness which has resolved. She has not had any recurrent headaches. She has a history of remote migraines but hasn't had a severe headache for 20 years. No inciting events, no head trauma. CT of the head was normal. Still is concerning due to severity acute onset. Neurologic exam is nonfocal.  History recommend MRI of the brain and MRA of the head to evaluate. Will order crp  and esr however she denies any symptoms of temporal arteritis.  To prevent or relieve headaches, try the following: Cool Compress. Lie down and place a cool compress on your head.  Avoid headache triggers. If certain foods or odors seem to have triggered your migraines in the past, avoid them. A headache diary might help you identify triggers.  Include physical activity in your daily routine. Try a daily walk or other moderate aerobic exercise.  Manage stress. Find healthy ways to cope with the stressors, such as delegating tasks on your to-do list.  Practice relaxation techniques. Try deep breathing, yoga, massage and visualization.  Eat regularly. Eating regularly scheduled meals and maintaining a healthy diet might help prevent headaches. Also, drink plenty of fluids.  Follow a regular sleep schedule. Sleep deprivation might contribute to headaches Consider biofeedback. With this mind-body technique, you learn to control certain bodily functions - such as muscle tension, heart rate and blood pressure - to prevent headaches or reduce headache pain.    Proceed to emergency room if you experience new or worsening symptoms or symptoms do not resolve, if you have new neurologic symptoms or if headache is severe, or for any concerning symptom.    Sarina Ill, MD  [facility] [address] [place], [place] [address]  Phone [phone] Fax [phone]

## 2015-09-07 LAB — BASIC METABOLIC PANEL
BUN/Creatinine Ratio: 25 (ref 11–26)
BUN: 15 mg/dL (ref 8–27)
CALCIUM: 9.3 mg/dL (ref 8.7–10.3)
CHLORIDE: 101 mmol/L (ref 96–106)
CO2: 25 mmol/L (ref 18–29)
Creatinine, Ser: 0.59 mg/dL (ref 0.57–1.00)
GFR calc Af Amer: 113 mL/min/{1.73_m2} (ref >59)
GFR calc non Af Amer: 98 mL/min/{1.73_m2} (ref >59)
Glucose: 95 mg/dL (ref 65–99)
POTASSIUM: 4.4 mmol/L (ref 3.5–5.2)
Sodium: 141 mmol/L (ref 134–144)

## 2015-09-07 LAB — SEDIMENTATION RATE: SED RATE: 23 mm/h (ref 0–40)

## 2015-09-07 LAB — C-REACTIVE PROTEIN: CRP: 39.1 mg/L — AB (ref 0.0–4.9)

## 2015-09-09 ENCOUNTER — Telehealth: Payer: Self-pay | Admitting: Neurology

## 2015-09-09 NOTE — Telephone Encounter (Signed)
Called patient to discuss elevated crp which is non-specific. Left a message. Patient had 20 minutes of severe headache which resolved. When I saw her she denied residual headche, vision changes, fever, jaw claudication. Need to monitor symptoms. I did speak with her about temporal arteritis during appointment and asked her go to ED should her headache return or  symptoms as above due to risk of permanent vision loss. However crp is nonspecific and she denies any symptoms currently. Will monitor. Will call patient back. thanks

## 2015-09-10 ENCOUNTER — Encounter: Payer: Self-pay | Admitting: Neurology

## 2015-09-12 NOTE — Telephone Encounter (Signed)
Called patient again, left message about labs. Would like to see if she is having any more headaches or any new vision changes or other symptoms. At last appointment she was not having any headaches or vision changes, symptoms had resolved. crp came back elevated but this is nonspecific. Asked her to call me back if her headaches worsen or she has any new vision or other changes. Will cc Dr. Wolfgang Phoenix on this message so he is aware. thanks

## 2015-09-26 ENCOUNTER — Ambulatory Visit (HOSPITAL_COMMUNITY): Payer: Self-pay

## 2015-10-17 ENCOUNTER — Encounter: Payer: Self-pay | Admitting: Gastroenterology

## 2015-12-01 ENCOUNTER — Encounter: Payer: Self-pay | Admitting: *Deleted

## 2015-12-04 NOTE — OR Nursing (Signed)
Called Dr Melburn Hake office spole to his nurse,  per Dr Michelene Heady ok to proceed with ordered meds with celebrex allergy

## 2015-12-05 ENCOUNTER — Ambulatory Visit: Payer: BLUE CROSS/BLUE SHIELD | Admitting: Anesthesiology

## 2015-12-05 ENCOUNTER — Encounter
Admission: RE | Disposition: A | Discharge status: Home / Self Care | Payer: Self-pay | Source: Ambulatory Visit | Attending: Ophthalmology

## 2015-12-05 ENCOUNTER — Ambulatory Visit
Admission: RE | Admit: 2015-12-05 | Discharge: 2015-12-05 | Disposition: A | Discharge status: Home / Self Care | Payer: BLUE CROSS/BLUE SHIELD | Source: Ambulatory Visit | Attending: Ophthalmology | Admitting: Ophthalmology

## 2015-12-05 ENCOUNTER — Encounter: Payer: Self-pay | Admitting: *Deleted

## 2015-12-05 DIAGNOSIS — M797 Fibromyalgia: Secondary | ICD-10-CM | POA: Diagnosis not present

## 2015-12-05 DIAGNOSIS — H2511 Age-related nuclear cataract, right eye: Secondary | ICD-10-CM | POA: Diagnosis not present

## 2015-12-05 DIAGNOSIS — Z9889 Other specified postprocedural states: Secondary | ICD-10-CM | POA: Diagnosis not present

## 2015-12-05 DIAGNOSIS — K449 Diaphragmatic hernia without obstruction or gangrene: Secondary | ICD-10-CM | POA: Diagnosis not present

## 2015-12-05 DIAGNOSIS — Z90710 Acquired absence of both cervix and uterus: Secondary | ICD-10-CM | POA: Insufficient documentation

## 2015-12-05 DIAGNOSIS — H269 Unspecified cataract: Secondary | ICD-10-CM | POA: Diagnosis present

## 2015-12-05 DIAGNOSIS — Z882 Allergy status to sulfonamides status: Secondary | ICD-10-CM | POA: Diagnosis not present

## 2015-12-05 DIAGNOSIS — Z79899 Other long term (current) drug therapy: Secondary | ICD-10-CM | POA: Diagnosis not present

## 2015-12-05 DIAGNOSIS — K219 Gastro-esophageal reflux disease without esophagitis: Secondary | ICD-10-CM | POA: Diagnosis not present

## 2015-12-05 DIAGNOSIS — K5090 Crohn's disease, unspecified, without complications: Secondary | ICD-10-CM | POA: Insufficient documentation

## 2015-12-05 DIAGNOSIS — M810 Age-related osteoporosis without current pathological fracture: Secondary | ICD-10-CM | POA: Diagnosis not present

## 2015-12-05 DIAGNOSIS — Z888 Allergy status to other drugs, medicaments and biological substances status: Secondary | ICD-10-CM | POA: Diagnosis not present

## 2015-12-05 HISTORY — PX: CATARACT EXTRACTION W/PHACO: SHX586

## 2015-12-05 HISTORY — DX: Unspecified osteoarthritis, unspecified site: M19.90

## 2015-12-05 HISTORY — DX: Personal history of other diseases of the digestive system: Z87.19

## 2015-12-05 HISTORY — DX: Gastro-esophageal reflux disease without esophagitis: K21.9

## 2015-12-05 SURGERY — PHACOEMULSIFICATION, CATARACT, WITH IOL INSERTION
Anesthesia: Monitor Anesthesia Care | Site: Eye | Laterality: Right | Wound class: Clean

## 2015-12-05 MED ORDER — POVIDONE-IODINE 5 % OP SOLN
OPHTHALMIC | Status: AC
  Administered 2015-12-05: 1 via OPHTHALMIC
  Filled 2015-12-05: qty 30

## 2015-12-05 MED ORDER — NA CHONDROIT SULF-NA HYALURON 40-17 MG/ML IO SOLN
INTRAOCULAR | Status: AC
  Filled 2015-12-05: qty 1

## 2015-12-05 MED ORDER — TETRACAINE HCL 0.5 % OP SOLN
OPHTHALMIC | Status: AC
  Filled 2015-12-05: qty 2

## 2015-12-05 MED ORDER — CEFUROXIME OPHTHALMIC INJECTION 1 MG/0.1 ML
INJECTION | OPHTHALMIC | Status: DC | PRN
  Administered 2015-12-05: .1 mL via INTRACAMERAL

## 2015-12-05 MED ORDER — NA CHONDROIT SULF-NA HYALURON 40-17 MG/ML IO SOLN
INTRAOCULAR | Status: DC | PRN
  Administered 2015-12-05: 1 mL via INTRAOCULAR

## 2015-12-05 MED ORDER — TETRACAINE HCL 0.5 % OP SOLN
1.0000 [drp] | Freq: Once | OPHTHALMIC | Status: AC
  Administered 2015-12-05: 1 [drp] via OPHTHALMIC

## 2015-12-05 MED ORDER — FENTANYL CITRATE (PF) 100 MCG/2ML IJ SOLN
INTRAMUSCULAR | Status: DC | PRN
  Administered 2015-12-05: 50 ug via INTRAVENOUS

## 2015-12-05 MED ORDER — MOXIFLOXACIN HCL 0.5 % OP SOLN
OPHTHALMIC | Status: DC | PRN
  Administered 2015-12-05: 1 [drp] via OPHTHALMIC

## 2015-12-05 MED ORDER — MOXIFLOXACIN HCL 0.5 % OP SOLN
OPHTHALMIC | Status: AC
  Filled 2015-12-05: qty 3

## 2015-12-05 MED ORDER — EPINEPHRINE HCL 1 MG/ML IJ SOLN
INTRAMUSCULAR | Status: AC
  Filled 2015-12-05: qty 1

## 2015-12-05 MED ORDER — ARMC OPHTHALMIC DILATING GEL
1.0000 "application " | OPHTHALMIC | Status: AC | PRN
  Administered 2015-12-05 (×2): 1 via OPHTHALMIC

## 2015-12-05 MED ORDER — POVIDONE-IODINE 5 % OP SOLN
1.0000 "application " | Freq: Once | OPHTHALMIC | Status: AC
  Administered 2015-12-05: 1 via OPHTHALMIC

## 2015-12-05 MED ORDER — MOXIFLOXACIN HCL 0.5 % OP SOLN: 1.0000 [drp] | OPHTHALMIC | Status: DC | PRN

## 2015-12-05 MED ORDER — ARMC OPHTHALMIC DILATING GEL
OPHTHALMIC | Status: AC
  Administered 2015-12-05: 1 via OPHTHALMIC
  Filled 2015-12-05: qty 0.25

## 2015-12-05 MED ORDER — CARBACHOL 0.01 % IO SOLN
INTRAOCULAR | Status: DC | PRN
  Administered 2015-12-05: .5 mL via INTRAOCULAR

## 2015-12-05 MED ORDER — EPINEPHRINE HCL 1 MG/ML IJ SOLN
INTRAOCULAR | Status: DC | PRN
  Administered 2015-12-05: 1 mL via OPHTHALMIC

## 2015-12-05 MED ORDER — MIDAZOLAM HCL 2 MG/2ML IJ SOLN
INTRAMUSCULAR | Status: DC | PRN
  Administered 2015-12-05: 1 mg via INTRAVENOUS

## 2015-12-05 MED ORDER — SODIUM CHLORIDE 0.9 % IV SOLN
INTRAVENOUS | Status: DC
  Administered 2015-12-05: 09:00:00 via INTRAVENOUS

## 2015-12-05 MED ORDER — CEFUROXIME OPHTHALMIC INJECTION 1 MG/0.1 ML
INJECTION | OPHTHALMIC | Status: AC
  Filled 2015-12-05: qty 0.1

## 2015-12-05 SURGICAL SUPPLY — 23 items
CANNULA ANT/CHMB 27G (MISCELLANEOUS) ×1 IMPLANT
CANNULA ANT/CHMB 27GA (MISCELLANEOUS) ×3 IMPLANT
CUP MEDICINE 2OZ PLAST GRAD ST (MISCELLANEOUS) ×3 IMPLANT
GLOVE BIO SURGEON STRL SZ8 (GLOVE) ×3 IMPLANT
GLOVE BIOGEL M 6.5 STRL (GLOVE) ×3 IMPLANT
GLOVE SURG LX 8.0 MICRO (GLOVE) ×2
GLOVE SURG LX STRL 8.0 MICRO (GLOVE) ×1 IMPLANT
GOWN STRL REUS W/ TWL LRG LVL3 (GOWN DISPOSABLE) ×2 IMPLANT
GOWN STRL REUS W/TWL LRG LVL3 (GOWN DISPOSABLE) ×6
LENS IOL TECNIS 15.5 (Intraocular Lens) ×3 IMPLANT
LENS IOL TECNIS MONO 1P 15.5 (Intraocular Lens) IMPLANT
PACK CATARACT (MISCELLANEOUS) ×3 IMPLANT
PACK CATARACT BRASINGTON LX (MISCELLANEOUS) ×3 IMPLANT
PACK EYE AFTER SURG (MISCELLANEOUS) ×3 IMPLANT
SOL BSS BAG (MISCELLANEOUS) ×3
SOL PREP PVP 2OZ (MISCELLANEOUS) ×3
SOLUTION BSS BAG (MISCELLANEOUS) ×1 IMPLANT
SOLUTION PREP PVP 2OZ (MISCELLANEOUS) ×1 IMPLANT
SYR 3ML LL SCALE MARK (SYRINGE) ×3 IMPLANT
SYR 5ML LL (SYRINGE) ×3 IMPLANT
SYR TB 1ML 27GX1/2 LL (SYRINGE) ×3 IMPLANT
WATER STERILE IRR 1000ML POUR (IV SOLUTION) ×3 IMPLANT
WIPE NON LINTING 3.25X3.25 (MISCELLANEOUS) ×3 IMPLANT

## 2015-12-05 NOTE — Anesthesia Postprocedure Evaluation (Signed)
Anesthesia Post Note  Patient: [name]  Procedure(s) Performed: Procedure(s) (LRB): CATARACT EXTRACTION PHACO AND INTRAOCULAR LENS PLACEMENT (IOC) (Right)  Patient location during evaluation: PACU Anesthesia Type: MAC Level of consciousness: awake, oriented and awake and alert Pain management: pain level controlled Vital Signs Assessment: post-procedure vital signs reviewed and stable Respiratory status: spontaneous breathing, nonlabored ventilation and respiratory function stable Cardiovascular status: stable Anesthetic complications: no    Last Vitals:  Filed Vitals:   12/05/15 0840 12/05/15 1029  BP: 147/52 136/65  Pulse: 58 61  Temp: 36.4 C   Resp: 16     Last Pain: There were no vitals filed for this visit.               [employer]

## 2015-12-05 NOTE — Op Note (Signed)
PREOPERATIVE DIAGNOSIS:  Nuclear sclerotic cataract of the right eye.   POSTOPERATIVE DIAGNOSIS: nuclear sclerotic cataract right eye   OPERATIVE PROCEDURE:  Procedure(s): CATARACT EXTRACTION PHACO AND INTRAOCULAR LENS PLACEMENT (IOC)   SURGEON:  Birder Robson, MD.   ANESTHESIA:  Anesthesiologist: Andria Frames, MD CRNA: Nelda Marseille, CRNA; Lance Muss, CRNA  1.      Managed anesthesia care. 2.      Topical tetracaine drops followed by 2% Xylocaine jelly applied in the preoperative holding area.   COMPLICATIONS:  None.   TECHNIQUE:   Stop and chop   DESCRIPTION OF PROCEDURE:  The patient was examined and consented in the preoperative holding area where the aforementioned topical anesthesia was applied to the right eye and then brought back to the Operating Room where the right eye was prepped and draped in the usual sterile ophthalmic fashion and a lid speculum was placed. A paracentesis was created with the side port blade and the anterior chamber was filled with viscoelastic. A near clear corneal incision was performed with the steel keratome. A continuous curvilinear capsulorrhexis was performed with a cystotome followed by the capsulorrhexis forceps. Hydrodissection and hydrodelineation were carried out with BSS on a blunt cannula. The lens was removed in a stop and chop  technique and the remaining cortical material was removed with the irrigation-aspiration handpiece. The capsular bag was inflated with viscoelastic and the Technis [id number]  lens was placed in the capsular bag without complication. The remaining viscoelastic was removed from the eye with the irrigation-aspiration handpiece. The wounds were hydrated. The anterior chamber was flushed with Miostat and the eye was inflated to physiologic pressure. 0.1 mL of cefuroxime concentration 10 mg/mL was placed in the anterior chamber. The wounds were found to be water tight. The eye was dressed with Vigamox. The patient was given  protective glasses to wear throughout the day and a shield with which to sleep tonight. The patient was also given drops with which to begin a drop regimen today and will follow-up with me in one day.  Implant Name Type Inv. Item Serial No. Manufacturer Lot No. LRB No. Used  LENS IOL TECNIS 15.5 - [id number] Intraocular Lens LENS IOL TECNIS 15.5 [id number] AMO   Right 1   Procedure(s) with comments: CATARACT EXTRACTION PHACO AND INTRAOCULAR LENS PLACEMENT (IOC) (Right) - [place] 00:59.9 AP% 21.9 CDE 13.10 fluid pack lot # [id number]  Electronically signed: [place] 12/05/2015 10:27 AM

## 2015-12-05 NOTE — H&P (Signed)
  All labs reviewed. Abnormal studies sent to patients PCP when indicated.  Previous H&P reviewed, patient examined, there are NO CHANGES.  [name] LOUIS5/9/201710:02 AM

## 2015-12-05 NOTE — Transfer of Care (Signed)
Immediate Anesthesia Transfer of Care Note  Patient: [name]  Procedure(s) Performed: Procedure(s) with comments: CATARACT EXTRACTION PHACO AND INTRAOCULAR LENS PLACEMENT (IOC) (Right) - [place] 00:59.9 AP% 21.9 CDE 13.10 fluid pack lot # [id number]  Patient Location: PACU  Anesthesia Type:MAC  Level of Consciousness: awake, alert  and oriented  Airway & Oxygen Therapy: Patient Spontanous Breathing  Post-op Assessment: Report given to RN and Post -op Vital signs reviewed and stable  Post vital signs: Reviewed and stable  Last Vitals:  Filed Vitals:   12/05/15 0840 12/05/15 1029  BP: 147/52 136/65  Pulse: 58 61  Temp: 36.4 C   Resp: 16     Last Pain: There were no vitals filed for this visit.       Complications: No apparent anesthesia complications

## 2015-12-05 NOTE — Discharge Instructions (Signed)
Eye Surgery Discharge Instructions  Expect mild scratchy sensation or mild soreness. DO NOT RUB YOUR EYE!  The day of surgery:  Minimal physical activity, but bed rest is not required  No reading, computer work, or close hand work  No bending, lifting, or straining.  May watch TV  For 24 hours:  No driving, legal decisions, or alcoholic beverages  Safety precautions  Eat anything you prefer: It is better to start with liquids, then soup then solid foods.  _____ Eye patch should be worn until postoperative exam tomorrow.  ____ Solar shield eyeglasses should be worn for comfort in the sunlight/patch while sleeping  Resume all regular medications including aspirin or Coumadin if these were discontinued prior to surgery. You may shower, bathe, shave, or wash your hair. Tylenol may be taken for mild discomfort.  Call your doctor if you experience significant pain, nausea, or vomiting, fever > 101 or other signs of infection. [phone] or [phone] Specific instructions:  Follow-up Information    Follow up with Tim Lair, MD.   Specialty:  Ophthalmology   Why:  12/06/15 at 10:10   Contact information:   [address] [place] [address] [phone]      Eye Surgery Discharge Instructions  Expect mild scratchy sensation or mild soreness. DO NOT RUB YOUR EYE!  The day of surgery:  Minimal physical activity, but bed rest is not required  No reading, computer work, or close hand work  No bending, lifting, or straining.  May watch TV  For 24 hours:  No driving, legal decisions, or alcoholic beverages  Safety precautions  Eat anything you prefer: It is better to start with liquids, then soup then solid foods.  _____ Eye patch should be worn until postoperative exam tomorrow.  ____ Solar shield eyeglasses should be worn for comfort in the sunlight/patch while sleeping  Resume all regular medications including aspirin or Coumadin if  these were discontinued prior to surgery. You may shower, bathe, shave, or wash your hair. Tylenol may be taken for mild discomfort.  Call your doctor if you experience significant pain, nausea, or vomiting, fever > 101 or other signs of infection. [phone] or [phone] Specific instructions:  Follow-up Information    Follow up with Tim Lair, MD.   Specialty:  Ophthalmology   Why:  12/06/15 at 10:10   Contact information:   [address] [place] [address] [phone]

## 2015-12-05 NOTE — Anesthesia Preprocedure Evaluation (Signed)
Anesthesia Evaluation  Patient identified by MRN, date of birth, ID band Patient awake    Reviewed: Allergy & Precautions, H&P , NPO status , Patient's Chart, lab work & pertinent test results  History of Anesthesia Complications Negative for: history of anesthetic complications  Airway Mallampati: III  TM Distance: >3 FB Neck ROM: limited    Dental  (+) Poor Dentition, Chipped   Pulmonary neg pulmonary ROS, neg shortness of breath,    Pulmonary exam normal breath sounds clear to auscultation       Cardiovascular Exercise Tolerance: Good (-) angina(-) Past MI and (-) DOE negative cardio ROS Normal cardiovascular exam Rhythm:regular Rate:Normal     Neuro/Psych  Headaches,  Neuromuscular disease negative psych ROS   GI/Hepatic Neg liver ROS, hiatal hernia, PUD, GERD  Controlled,  Endo/Other  negative endocrine ROS  Renal/GU negative Renal ROS  negative genitourinary   Musculoskeletal  (+) Arthritis , Fibromyalgia -  Abdominal   Peds  Hematology negative hematology ROS (+)   Anesthesia Other Findings Past Medical History:   Gastric ulcer                                                Hives                                                        Fibromyalgia                                                 Obesity                                                      Endometriosis                                                  Comment:HISTORY  LEADING TO HYSTERECTOMY   Helicobacter pylori gastritis                                  Comment:HISTORY   Iron (Fe) deficiency anemia                     2009           Comment:JAN 2012 NL HB & FERRITIN   Crohn disease ([place])                             JULY 2011*     Comment:PERSISTENT TI ULCERS seen on CE, NO NSAID USE   Plantar fasciitis  Fibromyalgia                                    12/09/2013    Osteoporosis                                     11/20/2014    B12 deficiency                                  07/09/2015   Headache                                                     GERD (gastroesophageal reflux disease)                       History of hiatal hernia                                     Arthritis                                                   Past Surgical History:   OVARIAN CYST REMOVAL                                          TOTAL ABDOMINAL HYSTERECTOMY W/ BILATERAL SALP*               UPPER GASTROINTESTINAL ENDOSCOPY                 JULY 2011*     Comment: Bx-H.PYLORI GASTRITIS, NL DUODENUM   Ileocolonoscopy                                  10/14/2007     Comment:Multiple 1-3 mm terminal ileum ulcers biopsied               The ulcers seemed to only involve the last 5 cm              of her terminal ileum, and 10 cm of her ileum               was visualized.  Otherwise the colon  was               without polyps, masses, diverticula, or AVMs.                She had a normal retroflexed view of the rectum   ESOPHAGOGASTRODUODENOSCOPY                       10/14/2007     Comment:Esophagus showed 2 cm pink tongue seen  extending from the GE junction.  Biopsies               obtained via cold forceps.  Otherwise the                esophagus was without erosions, mass,               ulceration, or stricture / . Large hiatal               hernia pouch with linear erosions, and two 3-6               mmulcers seen in the pouch.  Biopsies obtained               via cold forceps to  evaluate for H. pylori               gastritis or malignancy   ABDOMINAL HYSTERECTOMY                                        COLONOSCOPY                                                  BMI    Body Mass Index   33.28 kg/m 2    Signs and symptoms suggestive of sleep apnea    Reproductive/Obstetrics negative OB ROS                             Anesthesia Physical Anesthesia  Plan  ASA: III  Anesthesia Plan: MAC   Post-op Pain Management:    Induction:   Airway Management Planned:   Additional Equipment:   Intra-op Plan:   Post-operative Plan:   Informed Consent: I have reviewed the patients History and Physical, chart, labs and discussed the procedure including the risks, benefits and alternatives for the proposed anesthesia with the patient or authorized representative who has indicated his/her understanding and acceptance.   Dental Advisory Given  Plan Discussed with: Anesthesiologist, CRNA and Surgeon  Anesthesia Plan Comments:         Anesthesia Quick Evaluation

## 2015-12-26 ENCOUNTER — Ambulatory Visit: Admit: 2015-12-26 | Payer: BLUE CROSS/BLUE SHIELD | Admitting: Ophthalmology

## 2015-12-26 SURGERY — PHACOEMULSIFICATION, CATARACT, WITH IOL INSERTION
Anesthesia: Choice | Laterality: Left

## 2016-01-02 ENCOUNTER — Encounter (HOSPITAL_COMMUNITY): Payer: BLUE CROSS/BLUE SHIELD | Attending: Hematology & Oncology

## 2016-01-02 DIAGNOSIS — D509 Iron deficiency anemia, unspecified: Secondary | ICD-10-CM

## 2016-01-02 LAB — CBC WITH DIFFERENTIAL/PLATELET
BASOS ABS: 0 10*3/uL (ref 0.0–0.1)
Basophils Relative: 0 %
EOS ABS: 0.1 10*3/uL (ref 0.0–0.7)
EOS PCT: 1 %
HCT: 38.6 % (ref 36.0–46.0)
HEMOGLOBIN: 12.7 g/dL (ref 12.0–15.0)
LYMPHS PCT: 28 %
Lymphs Abs: 2 10*3/uL (ref 0.7–4.0)
MCH: 28.3 pg (ref 26.0–34.0)
MCHC: 32.9 g/dL (ref 30.0–36.0)
MCV: 86 fL (ref 78.0–100.0)
Monocytes Absolute: 0.4 10*3/uL (ref 0.1–1.0)
Monocytes Relative: 6 %
Neutro Abs: 4.8 10*3/uL (ref 1.7–7.7)
Neutrophils Relative %: 65 %
PLATELETS: 244 10*3/uL (ref 150–400)
RBC: 4.49 MIL/uL (ref 3.87–5.11)
RDW: 13.4 % (ref 11.5–15.5)
WBC: 7.4 10*3/uL (ref 4.0–10.5)

## 2016-01-02 LAB — IRON AND TIBC
IRON: 30 ug/dL (ref 28–170)
SATURATION RATIOS: 11 % (ref 10.4–31.8)
TIBC: 262 ug/dL (ref 250–450)
UIBC: 232 ug/dL

## 2016-01-02 LAB — FERRITIN: FERRITIN: 100 ng/mL (ref 11–307)

## 2016-01-03 ENCOUNTER — Ambulatory Visit (HOSPITAL_COMMUNITY): Payer: Self-pay | Admitting: Oncology

## 2016-01-03 ENCOUNTER — Ambulatory Visit (HOSPITAL_COMMUNITY): Payer: Self-pay | Admitting: Hematology & Oncology

## 2016-01-03 ENCOUNTER — Other Ambulatory Visit (HOSPITAL_COMMUNITY): Payer: Self-pay | Admitting: Oncology

## 2016-01-04 ENCOUNTER — Other Ambulatory Visit (HOSPITAL_COMMUNITY): Payer: Self-pay

## 2016-01-08 ENCOUNTER — Ambulatory Visit (HOSPITAL_COMMUNITY): Payer: Self-pay | Admitting: Hematology & Oncology

## 2016-01-11 ENCOUNTER — Encounter (HOSPITAL_BASED_OUTPATIENT_CLINIC_OR_DEPARTMENT_OTHER): Payer: BLUE CROSS/BLUE SHIELD

## 2016-01-11 ENCOUNTER — Encounter (HOSPITAL_COMMUNITY): Payer: Self-pay

## 2016-01-11 VITALS — BP 113/57 | HR 77 | Temp 98.2°F | Resp 20

## 2016-01-11 DIAGNOSIS — D509 Iron deficiency anemia, unspecified: Secondary | ICD-10-CM | POA: Diagnosis not present

## 2016-01-11 MED ORDER — NA FERRIC GLUC CPLX IN SUCROSE 12.5 MG/ML IV SOLN
250.0000 mg | Freq: Once | INTRAVENOUS | Status: AC
  Administered 2016-01-11: 250 mg via INTRAVENOUS
  Filled 2016-01-11: qty 20

## 2016-01-11 MED ORDER — SODIUM CHLORIDE 0.9 % IV SOLN
Freq: Once | INTRAVENOUS | Status: AC
  Administered 2016-01-11: 14:00:00 via INTRAVENOUS

## 2016-01-11 NOTE — Patient Instructions (Signed)
[place] at [facility] Discharge Instructions  RECOMMENDATIONS MADE BY THE CONSULTANT AND ANY TEST RESULTS WILL BE SENT TO YOUR REFERRING PHYSICIAN.  Iron infusion today. Return as scheduled for infusions. Return as scheduled for lab work and office visit.   Thank you for choosing [place] at [facility] to provide your oncology and hematology care.  To afford each patient quality time with our provider, please arrive at least 15 minutes before your scheduled appointment time.   Beginning January 23rd 2017 lab work for the [facility] will be done in the  Main lab at [facility] on 1st floor. If you have a lab appointment with the [facility] please come in thru the  Main Entrance and check in at the main information desk  You need to re-schedule your appointment should you arrive 10 or more minutes late.  We strive to give you quality time with our providers, and arriving late affects you and other patients whose appointments are after yours.  Also, if you no show three or more times for appointments you may be dismissed from the clinic at the providers discretion.     Again, thank you for choosing [facility].  Our hope is that these requests will decrease the amount of time that you wait before being seen by our physicians.       _____________________________________________________________  Should you have questions after your visit to [facility], please contact our office at [phone] between the hours of 8:30 a.m. and 4:30 p.m.  Voicemails left after 4:30 p.m. will not be returned until the following business day.  For prescription refill requests, have your pharmacy contact our office.         Resources For Cancer Patients and their Caregivers ? American Cancer Society: Can assist with transportation, wigs, general needs, runs Look Good Feel Better.        403-704-6881 ? Cancer  Care: Provides financial assistance, online support groups, medication/co-pay assistance.  1-800-813-HOPE (539) 778-9320) ? [place] Assists [place] cancer patients and their families through emotional , educational and financial support.  856-024-4685 ? [place] DSS Where to apply for food stamps, Medicaid and utility assistance. 4376190169 ? [facility]: Transportation to medical appointments. 908-531-6363 ? Social Security Administration: May apply for disability if have a Stage IV cancer. 9146334454 505-483-4340 ? [employer], Disability and Transit Services: Assists with nutrition, care and transit needs. [place] Support Programs: @10RELATIVEDAYS @ > Cancer Support Group  2nd Tuesday of the month 1pm-2pm, Journey Room  > Creative Journey  3rd Tuesday of the month 1130am-1pm, Journey Room  > Look Good Feel Better  1st Wednesday of the month 10am-12 noon, Journey Room (Call [place] to register [phone])

## 2016-01-11 NOTE — Progress Notes (Signed)
Tolerated infusion w/o adverse reaction.  VSS.  Discharged ambulatory. 

## 2016-01-29 ENCOUNTER — Encounter (HOSPITAL_COMMUNITY): Payer: BLUE CROSS/BLUE SHIELD | Attending: Hematology & Oncology

## 2016-01-29 ENCOUNTER — Ambulatory Visit (HOSPITAL_COMMUNITY): Payer: Self-pay

## 2016-01-29 VITALS — BP 130/56 | HR 64 | Temp 98.0°F | Resp 20 | Wt 209.2 lb

## 2016-01-29 DIAGNOSIS — D509 Iron deficiency anemia, unspecified: Secondary | ICD-10-CM | POA: Insufficient documentation

## 2016-01-29 MED ORDER — SODIUM CHLORIDE 0.9 % IV SOLN
Freq: Once | INTRAVENOUS | Status: AC
  Administered 2016-01-29: 14:00:00 via INTRAVENOUS

## 2016-01-29 MED ORDER — SODIUM CHLORIDE 0.9 % IV SOLN
250.0000 mg | Freq: Once | INTRAVENOUS | Status: AC
  Administered 2016-01-29: 250 mg via INTRAVENOUS
  Filled 2016-01-29: qty 20

## 2016-02-14 ENCOUNTER — Ambulatory Visit (HOSPITAL_COMMUNITY): Payer: Self-pay | Admitting: Oncology

## 2016-05-25 ENCOUNTER — Other Ambulatory Visit: Payer: Self-pay | Admitting: Gastroenterology

## 2016-05-31 ENCOUNTER — Other Ambulatory Visit: Payer: Self-pay | Admitting: Gastroenterology

## 2016-06-05 ENCOUNTER — Ambulatory Visit (INDEPENDENT_AMBULATORY_CARE_PROVIDER_SITE_OTHER): Payer: BLUE CROSS/BLUE SHIELD | Admitting: Nurse Practitioner

## 2016-06-05 ENCOUNTER — Encounter: Payer: Self-pay | Admitting: Nurse Practitioner

## 2016-06-05 VITALS — BP 125/73 | HR 78 | Temp 98.2°F | Ht 65.0 in | Wt 208.8 lb

## 2016-06-05 DIAGNOSIS — K5000 Crohn's disease of small intestine without complications: Secondary | ICD-10-CM

## 2016-06-05 DIAGNOSIS — K219 Gastro-esophageal reflux disease without esophagitis: Secondary | ICD-10-CM | POA: Diagnosis not present

## 2016-06-05 MED ORDER — MESALAMINE ER 500 MG PO CPCR: 1000.0000 mg | ORAL_CAPSULE | Freq: Four times a day (QID) | ORAL | 1 refills | Status: DC

## 2016-06-05 NOTE — Progress Notes (Signed)
Referring Provider: Kathyrn Drown, MD Primary Care Physician:  [name], MD Primary GI:  Dr. Oneida Alar  Chief Complaint  Patient presents with  . Follow-up    doing ok    HPI:   [name] is a 65 y.o. female who presents for yearly follow-up on Crohn's disease and refill of medications. Patient was last seen in our office 05/17/16. At that time it was noted that she was overall doing well. Recommended Vit B6 and B12, weight loss, continue weight loss efforts, continue Pentasa and Omeprazole. Follow-up in 6-12 months.   Today she states she's doing well overall. She is worried about her insurance premium for this coming year. She's doing well related to her Crohn's. Has occasional/rare diarrhea about 1-2 times a month. Denies hematochezia, melena, abdominal pain, N/V, unintentional weight loss. Energy level in on/off but has history of anemia and fibromyalgia which contributes but "overall I think I'm doing pretty good." Denies any adverse effects with Pentasa. GERD symptoms well controlled, rare breakthrough. Denies chest pain, dyspnea, dizziness, lightheadedness, syncope, near syncope. Denies any other upper or lower GI symptoms.  Past Medical History:  Diagnosis Date  . Arthritis   . B12 deficiency 07/09/2015  . Crohn disease ([place]) JULY 2011 FEDA   PERSISTENT TI ULCERS seen on CE, NO NSAID USE  . Endometriosis    HISTORY  LEADING TO HYSTERECTOMY  . Fibromyalgia   . Fibromyalgia 12/09/2013  . Gastric ulcer   . GERD (gastroesophageal reflux disease)   . Headache   . Helicobacter pylori gastritis    HISTORY  . History of hiatal hernia   . Hives   . Iron (Fe) deficiency anemia 2009   JAN 2012 NL HB & FERRITIN  . Obesity   . Osteoporosis 11/20/2014  . Plantar fasciitis     Past Surgical History:  Procedure Laterality Date  . ABDOMINAL HYSTERECTOMY    . CATARACT EXTRACTION W/PHACO Right 12/05/2015   Procedure: CATARACT EXTRACTION PHACO AND INTRAOCULAR LENS PLACEMENT  (IOC);  Surgeon: Birder Robson, MD;  Location: [facility] ORS;  Service: Ophthalmology;  Laterality: Right;  [place] 00:59.9 AP% 21.9 CDE 13.10 fluid pack lot # [id number]  . COLONOSCOPY    . ESOPHAGOGASTRODUODENOSCOPY  10/14/2007   Esophagus showed 2 cm pink tongue seen extending from the GE junction.  Biopsies obtained via cold forceps.  Otherwise the  esophagus was without erosions, mass, ulceration, or stricture / . Large hiatal hernia pouch with linear erosions, and two 3-6 mmulcers seen in the pouch.  Biopsies obtained via cold forceps to  evaluate for H. pylori gastritis or malignancy  . Ileocolonoscopy  10/14/2007   Multiple 1-3 mm terminal ileum ulcers biopsied  The ulcers seemed to only involve the last 5 cm of her terminal ileum, and 10 cm of her ileum was visualized.  Otherwise the colon  was without polyps, masses, diverticula, or AVMs.  She had a normal retroflexed view of the rectum  . OVARIAN CYST REMOVAL    . TOTAL ABDOMINAL HYSTERECTOMY W/ BILATERAL SALPINGOOPHORECTOMY    . UPPER GASTROINTESTINAL ENDOSCOPY  JULY 2011 DIARRHEA/FEDA    Bx-H.PYLORI GASTRITIS, NL DUODENUM    Current Outpatient Prescriptions  Medication Sig Dispense Refill  . acetaminophen (TYLENOL) 325 MG tablet Take 650 mg by mouth as needed.      Marland Kitchen omeprazole (PRILOSEC) 20 MG capsule TAKE 1 CAPSULE (20 MG TOTAL) BY MOUTH DAILY. 31 capsule 11  . PENTASA 500 MG CR capsule TAKE 2 CAPSULES BY MOUTH  4 TIMES A DAY 240 capsule 5   No current facility-administered medications for this visit.     Allergies as of 06/05/2016 - Review Complete 06/05/2016  Allergen Reaction Noted  . Celebrex [celecoxib] Hives and Itching 01/07/2011  . Sulfonamide derivatives      Family History  Problem Relation Age of Onset  . Diabetes Mother   . Cancer Father   . Diabetes Father   . Diabetes Sister   . Diabetes Brother   . Colon polyps Neg Hx   . Colon cancer Neg Hx   . Stroke Neg Hx     Social History   Social History  .  Marital status: Married    Spouse name: Alvester Chou   . Number of children: 3  . Years of education: 12   Occupational History  . Retired    Social History Main Topics  . Smoking status: Never Smoker  . Smokeless tobacco: Never Used  . Alcohol use No  . Drug use: No  . Sexual activity: Not Asked   Other Topics Concern  . None   Social History Narrative   Lives w/ husband   Caffeine use: Drinks 1-2 cups coffee per day    Review of Systems: Complete ROS negative except as per HPI.   Physical Exam: BP 125/73   Pulse 78   Temp 98.2 F (36.8 C) (Oral)   Ht 5' 5"  (1.651 m)   Wt 208 lb 12.8 oz (94.7 kg)   BMI 34.75 kg/m  General:   Alert and oriented. Pleasant and cooperative. Well-nourished and well-developed.  Eyes:  Without icterus, sclera clear and conjunctiva pink.  Ears:  Normal auditory acuity. Cardiovascular:  S1, S2 present without murmurs appreciated. Extremities without clubbing or edema. Respiratory:  Clear to auscultation bilaterally. No wheezes, rales, or rhonchi. No distress.  Gastrointestinal:  +BS, soft, non-tender and non-distended. No HSM noted. No guarding or rebound. No masses appreciated.  Rectal:  Deferred  Musculoskalatal:  Symmetrical without gross deformities. Neurologic:  Alert and oriented x4;  grossly normal neurologically. Psych:  Alert and cooperative. Normal mood and affect. Heme/Lymph/Immune: No excessive bruising noted.    06/05/2016 3:04 PM   Disclaimer: This note was dictated with voice recognition software. Similar sounding words can inadvertently be transcribed and may not be corrected upon review.

## 2016-06-05 NOTE — Assessment & Plan Note (Signed)
Crohn's disease is doing well on Pentasa. No adverse effects. I will check CBC and CMP today. Refill her Pentasa for 90 day supply. Return for follow-up in 6-12 months.   Hemoglobin stable over the past multiple draws including 12.7 on 01/02/2016.

## 2016-06-05 NOTE — Progress Notes (Signed)
cc'ed to pcp °

## 2016-06-05 NOTE — Assessment & Plan Note (Signed)
Her GERD symptoms are currently very well controlled on omeprazole. Continue medications, continue to monitor, return for follow-up as needed.

## 2016-06-05 NOTE — Patient Instructions (Signed)
1. Your blood work drawn when you're able to. 2. I have refilled your Pentasa for a 90 day supply. 3. Return for follow-up in 6-12 months.

## 2016-06-13 NOTE — Patient Instructions (Signed)
[name]  06/13/2016     @PREFPERIOPPHARMACY @   Your procedure is scheduled on 06/24/2016.  Report to [name] at 9:50 A.M.  Call this number if you have problems the morning of surgery:  [phone]   Remember:  Do not eat food or drink liquids after midnight.  Take these medicines the morning of surgery with A SIP OF WATER : PRILOSEC AND PENTASA   Do not wear jewelry, make-up or nail polish.  Do not wear lotions, powders, or perfumes, or deoderant.  Do not shave 48 hours prior to surgery.  Men may shave face and neck.  Do not bring valuables to the hospital.  [facility] is not responsible for any belongings or valuables.  Contacts, dentures or bridgework may not be worn into surgery.  Leave your suitcase in the car.  After surgery it may be brought to your room.  For patients admitted to the hospital, discharge time will be determined by your treatment team.  Patients discharged the day of surgery will not be allowed to drive home.   Name and phone number of your driver:   FAMILY Special instructions:  N/A  Please read over the following fact sheets that you were given. Care and Recovery After Surgery   PATIENT INSTRUCTIONS POST-ANESTHESIA  IMMEDIATELY FOLLOWING SURGERY:  Do not drive or operate machinery for the first twenty four hours after surgery.  Do not make any important decisions for twenty four hours after surgery or while taking narcotic pain medications or sedatives.  If you develop intractable nausea and vomiting or a severe headache please notify your doctor immediately.  FOLLOW-UP:  Please make an appointment with your surgeon as instructed. You do not need to follow up with anesthesia unless specifically instructed to do so.  WOUND CARE INSTRUCTIONS (if applicable):  Keep a dry clean dressing on the anesthesia/puncture wound site if there is drainage.  Once the wound has quit draining you may leave it open to air.  Generally you should leave the  bandage intact for twenty four hours unless there is drainage.  If the epidural site drains for more than 36-48 hours please call the anesthesia department.  QUESTIONS?:  Please feel free to call your physician or the hospital operator if you have any questions, and they will be happy to assist you.       Cataract A cataract is cloudiness on the lens of your eye. The lens is the clear part of your eye that is behind your iris and pupil. The lens focuses light on the retina, which lets you see clearly. When a lens becomes cloudy, vision may become blurry. The clouding can range from a tiny dot to complete cloudiness. As some cataracts develop, they make a person more nearsighted. Other cataracts increase glare. Cataracts can worsen over time, and sometimes the pupil can look white. Cataracts get bigger and they cloud more of the lens, making it difficult to see. Cataracts can affect one eye or both eyes. What are the causes? Most cataracts are associated with age-related eye changes. The eye lens is mostly made up of water and protein. Normally, this protein is arranged in a way that keeps the lens clear. Cataracts develop when protein begins to clump together over time. This clouds the lens and lets less light pass through to the retina, which causes blurry vision. What increases the risk? This condition is more likely to develop in people who:  Are 28 years of age or  older.  Have diabetes.  Have high blood pressure.  Takecertain medicines, such as steroids or hormone replacement therapy.  Have had an eye injury.  Have or have had eye inflammation.  Have a family history of cataracts.  Smoke.  Drink alcohol heavily.  Are frequently exposed to sun or very strong light without eye protection.  Are obese.  Have been exposed to large amounts of radiation, lead, or other toxic substances.  Have had eye surgery. What are the signs or symptoms? The main symptom of a cataract is  blurry vision. Your vision may change or get worse over time. Other symptoms include:  Increased glare.  Seeing a bright ring or halo around light.  Poor night vision.  Double vision in one eye.  Having trouble seeing, even while wearing contact lenses or glasses.  Seeing colors that appear faded.  Trouble telling the difference between blue and purple.  Needing frequent changes to your prescription glasses or contacts. How is this diagnosed? This condition is diagnosed with a medical history and eye exam. You may need to see an eye specialist (optometrist or ophthalmologist). Your health care provider may enlarge (dilate) your pupils with eye drops to see the back of your eye more clearly and look for signs of cataracts or other damage. You may also have tests, including:  A visual acuity test. This uses a chart to determine the smallest letters that you can see from a specific distance.  A slit-lamp exam. This uses a microscope to examine small sections of your eye for abnormalities.  Tonometry. This test measures the pressure of the fluid inside your eye. How is this treated? Treatment depends on the stage of your cataract. For an early cataract, vision may improve by using different eyeglasses or stronger lighting. If that does not help your vision, surgery may be recommended to remove the cataract. If your health care provider thinks your cataract may be linked to any medicines that you are taking, he or she may change your medicines. Follow these instructions at home: Lifestyle  Use stronger or brighter lighting.  Consider using a magnifying glass for reading or other activities.  Become familiar with your surroundings. Having poor vision can put you at a greater risk for tripping, falling, or bumping into things.  Wear sunglasses and a hat if you are sensitive to bright light or are having problems with glare.  Quit smoking if you smoke. If you need help quitting, talk  with your health care provider. General instructions  If you are prescribed new eyeglasses, wear them as told by your health care provider.  Take over-the-counter and prescription medicines only as told by your health care provider. Do not change your medicines unless told by your health care provider.  Do not drive or operate heavy machinery if your vision is blurry, particularly at night.  Keep your blood sugar under control, if you have diabetes.  Keep all follow-up visits as told by your health care provider. This is important. Contact a health care provider if:  Your symptoms get worse.  Your vision affects your ability to perform daily activities.  You have new symptoms.  You have a fever. Get help right away if:  You have sudden vision loss.  You have redness, swelling, or increasing pain in your eye.  You develop a headache and sensitivity to light. This information is not intended to replace advice given to you by your health care provider. Make sure you discuss any questions you  have with your health care provider. Document Released: 07/15/2005 Document Revised: 11/23/2015 Document Reviewed: 01/18/2015 Elsevier Interactive Patient Education  2017 [employer].

## 2016-06-14 ENCOUNTER — Encounter (HOSPITAL_COMMUNITY)
Admission: RE | Admit: 2016-06-14 | Discharge: 2016-06-14 | Disposition: A | Discharge status: Home / Self Care | Payer: BLUE CROSS/BLUE SHIELD | Source: Ambulatory Visit | Attending: Ophthalmology | Admitting: Ophthalmology

## 2016-06-14 ENCOUNTER — Encounter (HOSPITAL_COMMUNITY): Payer: Self-pay

## 2016-06-14 DIAGNOSIS — Z01812 Encounter for preprocedural laboratory examination: Secondary | ICD-10-CM | POA: Diagnosis not present

## 2016-06-14 LAB — CBC WITH DIFFERENTIAL/PLATELET
BASOS ABS: 0 10*3/uL (ref 0.0–0.1)
Basophils Relative: 1 %
Eosinophils Absolute: 0.1 10*3/uL (ref 0.0–0.7)
Eosinophils Relative: 1 %
HEMATOCRIT: 36.5 % (ref 36.0–46.0)
Hemoglobin: 12.1 g/dL (ref 12.0–15.0)
LYMPHS ABS: 2.4 10*3/uL (ref 0.7–4.0)
LYMPHS PCT: 31 %
MCH: 29.1 pg (ref 26.0–34.0)
MCHC: 33.2 g/dL (ref 30.0–36.0)
MCV: 87.7 fL (ref 78.0–100.0)
MONO ABS: 0.5 10*3/uL (ref 0.1–1.0)
Monocytes Relative: 6 %
NEUTROS ABS: 4.8 10*3/uL (ref 1.7–7.7)
Neutrophils Relative %: 61 %
Platelets: 248 10*3/uL (ref 150–400)
RBC: 4.16 MIL/uL (ref 3.87–5.11)
RDW: 13.4 % (ref 11.5–15.5)
WBC: 7.7 10*3/uL (ref 4.0–10.5)

## 2016-06-14 LAB — BASIC METABOLIC PANEL
Anion gap: 8 (ref 5–15)
BUN: 19 mg/dL (ref 6–20)
CO2: 23 mmol/L (ref 22–32)
Calcium: 8.7 mg/dL — ABNORMAL LOW (ref 8.9–10.3)
Chloride: 108 mmol/L (ref 101–111)
Creatinine, Ser: 0.65 mg/dL (ref 0.44–1.00)
GFR calc Af Amer: >60 mL/min (ref >60)
GFR calc non Af Amer: >60 mL/min (ref >60)
GLUCOSE: 93 mg/dL (ref 65–99)
POTASSIUM: 3.5 mmol/L (ref 3.5–5.1)
Sodium: 139 mmol/L (ref 135–145)

## 2016-06-24 ENCOUNTER — Encounter (HOSPITAL_COMMUNITY)
Admission: RE | Disposition: A | Discharge status: Home / Self Care | Payer: Self-pay | Source: Ambulatory Visit | Attending: Ophthalmology

## 2016-06-24 ENCOUNTER — Ambulatory Visit (HOSPITAL_COMMUNITY): Payer: BLUE CROSS/BLUE SHIELD | Admitting: Anesthesiology

## 2016-06-24 ENCOUNTER — Ambulatory Visit (HOSPITAL_COMMUNITY)
Admission: RE | Admit: 2016-06-24 | Discharge: 2016-06-24 | Disposition: A | Discharge status: Home / Self Care | Payer: BLUE CROSS/BLUE SHIELD | Source: Ambulatory Visit | Attending: Ophthalmology | Admitting: Ophthalmology

## 2016-06-24 ENCOUNTER — Encounter (HOSPITAL_COMMUNITY): Payer: Self-pay | Admitting: *Deleted

## 2016-06-24 DIAGNOSIS — H2512 Age-related nuclear cataract, left eye: Secondary | ICD-10-CM | POA: Insufficient documentation

## 2016-06-24 DIAGNOSIS — K219 Gastro-esophageal reflux disease without esophagitis: Secondary | ICD-10-CM | POA: Insufficient documentation

## 2016-06-24 DIAGNOSIS — M797 Fibromyalgia: Secondary | ICD-10-CM | POA: Insufficient documentation

## 2016-06-24 DIAGNOSIS — G709 Myoneural disorder, unspecified: Secondary | ICD-10-CM | POA: Insufficient documentation

## 2016-06-24 DIAGNOSIS — K279 Peptic ulcer, site unspecified, unspecified as acute or chronic, without hemorrhage or perforation: Secondary | ICD-10-CM | POA: Insufficient documentation

## 2016-06-24 DIAGNOSIS — M1990 Unspecified osteoarthritis, unspecified site: Secondary | ICD-10-CM | POA: Diagnosis not present

## 2016-06-24 DIAGNOSIS — K449 Diaphragmatic hernia without obstruction or gangrene: Secondary | ICD-10-CM | POA: Insufficient documentation

## 2016-06-24 HISTORY — PX: CATARACT EXTRACTION W/PHACO: SHX586

## 2016-06-24 SURGERY — PHACOEMULSIFICATION, CATARACT, WITH IOL INSERTION
Anesthesia: Monitor Anesthesia Care | Site: Eye | Laterality: Left

## 2016-06-24 MED ORDER — BSS IO SOLN
INTRAOCULAR | Status: DC | PRN
  Administered 2016-06-24: 15 mL via INTRAOCULAR

## 2016-06-24 MED ORDER — PHENYLEPHRINE HCL 2.5 % OP SOLN
1.0000 [drp] | OPHTHALMIC | Status: AC
  Administered 2016-06-24 (×3): 1 [drp] via OPHTHALMIC

## 2016-06-24 MED ORDER — POVIDONE-IODINE 5 % OP SOLN
OPHTHALMIC | Status: DC | PRN
  Administered 2016-06-24: 1 via OPHTHALMIC

## 2016-06-24 MED ORDER — TETRACAINE HCL 0.5 % OP SOLN
1.0000 [drp] | OPHTHALMIC | Status: AC
  Administered 2016-06-24 (×3): 1 [drp] via OPHTHALMIC

## 2016-06-24 MED ORDER — EPINEPHRINE PF 1 MG/ML IJ SOLN
INTRAOCULAR | Status: DC | PRN
  Administered 2016-06-24: 500 mL

## 2016-06-24 MED ORDER — LACTATED RINGERS IV SOLN
INTRAVENOUS | Status: DC
  Administered 2016-06-24: 1000 mL via INTRAVENOUS

## 2016-06-24 MED ORDER — MIDAZOLAM HCL 2 MG/2ML IJ SOLN
INTRAMUSCULAR | Status: AC
  Filled 2016-06-24: qty 2

## 2016-06-24 MED ORDER — PROVISC 10 MG/ML IO SOLN
INTRAOCULAR | Status: DC | PRN
  Administered 2016-06-24: 0.85 mL via INTRAOCULAR

## 2016-06-24 MED ORDER — CYCLOPENTOLATE-PHENYLEPHRINE 0.2-1 % OP SOLN
1.0000 [drp] | OPHTHALMIC | Status: AC
  Administered 2016-06-24 (×3): 1 [drp] via OPHTHALMIC

## 2016-06-24 MED ORDER — EPINEPHRINE PF 1 MG/ML IJ SOLN
INTRAMUSCULAR | Status: AC
  Filled 2016-06-24: qty 1

## 2016-06-24 MED ORDER — LIDOCAINE HCL (PF) 1 % IJ SOLN
INTRAMUSCULAR | Status: DC | PRN
  Administered 2016-06-24: .5 mL

## 2016-06-24 MED ORDER — NEOMYCIN-POLYMYXIN-DEXAMETH 3.5-10000-0.1 OP SUSP
OPHTHALMIC | Status: DC | PRN
  Administered 2016-06-24: 2 [drp] via OPHTHALMIC

## 2016-06-24 MED ORDER — FENTANYL CITRATE (PF) 100 MCG/2ML IJ SOLN
INTRAMUSCULAR | Status: AC
  Filled 2016-06-24: qty 2

## 2016-06-24 MED ORDER — FENTANYL CITRATE (PF) 100 MCG/2ML IJ SOLN
25.0000 ug | INTRAMUSCULAR | Status: AC | PRN
  Administered 2016-06-24 (×2): 25 ug via INTRAVENOUS

## 2016-06-24 MED ORDER — LIDOCAINE 3.5 % OP GEL OPTIME - NO CHARGE
OPHTHALMIC | Status: DC | PRN
  Administered 2016-06-24: 2 [drp] via OPHTHALMIC

## 2016-06-24 MED ORDER — LIDOCAINE HCL 3.5 % OP GEL
1.0000 "application " | Freq: Once | OPHTHALMIC | Status: AC
  Administered 2016-06-24: 1 via OPHTHALMIC

## 2016-06-24 MED ORDER — MIDAZOLAM HCL 2 MG/2ML IJ SOLN
1.0000 mg | INTRAMUSCULAR | Status: DC | PRN
Start: 2016-06-24 — End: 2016-06-24
  Administered 2016-06-24: 2 mg via INTRAVENOUS

## 2016-06-24 SURGICAL SUPPLY — 23 items
CAPSULAR TENSION RING-AMO (OPHTHALMIC RELATED) IMPLANT
CLOTH BEACON ORANGE TIMEOUT ST (SAFETY) ×2 IMPLANT
EYE SHIELD UNIVERSAL CLEAR (GAUZE/BANDAGES/DRESSINGS) ×2 IMPLANT
GLOVE BIOGEL PI IND STRL 6.5 (GLOVE) IMPLANT
GLOVE BIOGEL PI IND STRL 7.0 (GLOVE) IMPLANT
GLOVE BIOGEL PI INDICATOR 6.5 (GLOVE) ×2
GLOVE BIOGEL PI INDICATOR 7.0 (GLOVE)
GLOVE EXAM NITRILE LRG STRL (GLOVE) IMPLANT
GLOVE EXAM NITRILE MD LF STRL (GLOVE) ×2 IMPLANT
KIT VITRECTOMY (OPHTHALMIC RELATED) IMPLANT
PAD ARMBOARD 7.5X6 YLW CONV (MISCELLANEOUS) ×2 IMPLANT
PROC W NO LENS (INTRAOCULAR LENS)
PROC W SPEC LENS (INTRAOCULAR LENS)
PROCESS W NO LENS (INTRAOCULAR LENS) IMPLANT
PROCESS W SPEC LENS (INTRAOCULAR LENS) IMPLANT
RETRACTOR IRIS SIGHTPATH (OPHTHALMIC RELATED) IMPLANT
RING MALYGIN (MISCELLANEOUS) IMPLANT
SIGHTPATH CAT PROC W REG LENS (Ophthalmic Related) ×3 IMPLANT
SYRINGE LUER LOK 1CC (MISCELLANEOUS) ×2 IMPLANT
TAPE SURG TRANSPORE 1 IN (GAUZE/BANDAGES/DRESSINGS) IMPLANT
TAPE SURGICAL TRANSPORE 1 IN (GAUZE/BANDAGES/DRESSINGS) ×2
VISCOELASTIC ADDITIONAL (OPHTHALMIC RELATED) IMPLANT
WATER STERILE IRR 250ML POUR (IV SOLUTION) ×2 IMPLANT

## 2016-06-24 NOTE — Discharge Instructions (Signed)

## 2016-06-24 NOTE — Anesthesia Postprocedure Evaluation (Signed)
  Anesthesia Post-op Note  Patient: [name]  Procedure(s) Performed: Procedure(s) (LRB): CATARACT EXTRACTION PHACO AND INTRAOCULAR LENS PLACEMENT LEFT EYE; CDE:  8.33 (Left)  Patient Location:  Short Stay  Anesthesia Type: MAC  Level of Consciousness: awake  Airway and Oxygen Therapy: Patient Spontanous Breathing  Post-op Pain: none  Post-op Assessment: Post-op Vital signs reviewed, Patient's Cardiovascular Status Stable, Respiratory Function Stable, Patent Airway, No signs of Nausea or vomiting and Pain level controlled  Post-op Vital Signs: Reviewed and stable  Complications: No apparent anesthesia complications Anesthesia Post Note  Patient: [name]  Procedure(s) Performed: Procedure(s) (LRB): CATARACT EXTRACTION PHACO AND INTRAOCULAR LENS PLACEMENT LEFT EYE; CDE:  8.33 (Left)  Anesthesia Post Evaluation  Last Vitals:  Vitals:   06/24/16 1136 06/24/16 1145  Resp: 18 (!) 21  Temp: 36.4 C     Last Pain:  Vitals:   06/24/16 1136  TempSrc: Oral                 Drucie Opitz

## 2016-06-24 NOTE — Anesthesia Preprocedure Evaluation (Signed)
Anesthesia Evaluation  Patient identified by MRN, date of birth, ID band Patient awake    Reviewed: Allergy & Precautions, H&P , NPO status , Patient's Chart, lab work & pertinent test results  History of Anesthesia Complications Negative for: history of anesthetic complications  Airway Mallampati: III  TM Distance: >3 FB Neck ROM: limited    Dental  (+) Poor Dentition, Chipped   Pulmonary neg pulmonary ROS, neg shortness of breath,    Pulmonary exam normal breath sounds clear to auscultation       Cardiovascular Exercise Tolerance: Good (-) angina(-) Past MI and (-) DOE negative cardio ROS Normal cardiovascular exam Rhythm:regular Rate:Normal     Neuro/Psych  Headaches,  Neuromuscular disease negative psych ROS   GI/Hepatic Neg liver ROS, hiatal hernia, PUD, GERD  Controlled,  Endo/Other  negative endocrine ROS  Renal/GU negative Renal ROS  negative genitourinary   Musculoskeletal  (+) Arthritis , Fibromyalgia -  Abdominal   Peds  Hematology negative hematology ROS (+)   Anesthesia Other Findings   Reproductive/Obstetrics negative OB ROS                             Anesthesia Physical Anesthesia Plan  ASA: III  Anesthesia Plan: MAC   Post-op Pain Management:    Induction: Intravenous  Airway Management Planned: Nasal Cannula  Additional Equipment:   Intra-op Plan:   Post-operative Plan:   Informed Consent: I have reviewed the patients History and Physical, chart, labs and discussed the procedure including the risks, benefits and alternatives for the proposed anesthesia with the patient or authorized representative who has indicated his/her understanding and acceptance.     Plan Discussed with: Anesthesiologist, CRNA and Surgeon  Anesthesia Plan Comments:         Anesthesia Quick Evaluation

## 2016-06-24 NOTE — Anesthesia Procedure Notes (Signed)
Procedure Name: MAC Date/Time: 06/24/2016 12:35 PM Performed by: Vista Deck Pre-anesthesia Checklist: Patient identified, Emergency Drugs available, Suction available, Timeout performed and Patient being monitored Patient Re-evaluated:Patient Re-evaluated prior to inductionOxygen Delivery Method: Nasal Cannula

## 2016-06-24 NOTE — H&P (Signed)
I have reviewed the H&P, the patient was re-examined, and I have identified no interval changes in medical condition and plan of care since the history and physical of record  

## 2016-06-24 NOTE — Transfer of Care (Signed)
Immediate Anesthesia Transfer of Care Note  Patient: [name]  Procedure(s) Performed: Procedure(s) (LRB): CATARACT EXTRACTION PHACO AND INTRAOCULAR LENS PLACEMENT LEFT EYE; CDE:  8.33 (Left)  Patient Location: Shortstay  Anesthesia Type: MAC  Level of Consciousness: awake  Airway & Oxygen Therapy: Patient Spontanous Breathing   Post-op Assessment: Report given to PACU RN, Post -op Vital signs reviewed and stable and Patient moving all extremities  Post vital signs: Reviewed and stable  Complications: No apparent anesthesia complications

## 2016-06-24 NOTE — Op Note (Signed)
Date of Admission: 06/24/2016  Date of Surgery: 06/24/2016  Pre-Op Dx: Cataract Left  Eye  Post-Op Dx: Senile Nuclear Cataract  Left  Eye,  Dx Code H25.12  Surgeon: Tonny Branch, M.D.  Assistants: None  Anesthesia: Topical with MAC  Indications: Painless, progressive loss of vision with compromise of daily activities.  Surgery: Cataract Extraction with Intraocular lens Implant Left Eye  Discription: The patient had dilating drops and viscous lidocaine placed into the Left eye in the pre-op holding area. After transfer to the operating room, a time out was performed. The patient was then prepped and draped. Beginning with a 24 degree blade a paracentesis port was made at the surgeon's 2 o'clock position. The anterior chamber was then filled with 1% non-preserved lidocaine. This was followed by filling the anterior chamber with Provisc.  A 2.77mm keratome blade was used to make a clear corneal incision at the temporal limbus.  A bent cystatome needle was used to create a continuous tear capsulotomy. Hydrodissection was performed with balanced salt solution on a Fine canula. The lens nucleus was then removed using the phacoemulsification handpiece. Residual cortex was removed with the I&A handpiece. The anterior chamber and capsular bag were refilled with Provisc. A posterior chamber intraocular lens was placed into the capsular bag with it's injector. The implant was positioned with the Kuglan hook. The Provisc was then removed from the anterior chamber and capsular bag with the I&A handpiece. Stromal hydration of the main incision and paracentesis port was performed with BSS on a Fine canula. The wounds were tested for leak which was negative. The patient tolerated the procedure well. There were no operative complications. The patient was then transferred to the recovery room in stable condition.  Complications: None  Specimen: None  EBL: None  Prosthetic device: Abbott Technis, PCB00, power  14.5, SN [id number].

## 2016-06-27 ENCOUNTER — Encounter (HOSPITAL_COMMUNITY): Payer: Self-pay | Admitting: Ophthalmology

## 2016-07-10 ENCOUNTER — Encounter: Payer: Self-pay | Admitting: Family Medicine

## 2016-07-10 ENCOUNTER — Ambulatory Visit (INDEPENDENT_AMBULATORY_CARE_PROVIDER_SITE_OTHER): Payer: Medicare Other | Admitting: Family Medicine

## 2016-07-10 VITALS — Temp 98.1°F | Ht 65.0 in | Wt 213.4 lb

## 2016-07-10 DIAGNOSIS — J310 Chronic rhinitis: Secondary | ICD-10-CM

## 2016-07-10 DIAGNOSIS — J329 Chronic sinusitis, unspecified: Secondary | ICD-10-CM

## 2016-07-10 DIAGNOSIS — R05 Cough: Secondary | ICD-10-CM

## 2016-07-10 DIAGNOSIS — R059 Cough, unspecified: Secondary | ICD-10-CM

## 2016-07-10 MED ORDER — HYDROCODONE-HOMATROPINE 5-1.5 MG/5ML PO SYRP: 5.0000 mL | ORAL_SOLUTION | Freq: Every evening | ORAL | 0 refills | Status: DC | PRN

## 2016-07-10 MED ORDER — CEFTRIAXONE SODIUM 1 G IJ SOLR
500.0000 mg | Freq: Once | INTRAMUSCULAR | Status: AC
  Administered 2016-07-10: 500 mg via INTRAMUSCULAR

## 2016-07-10 MED ORDER — CEFPROZIL 500 MG PO TABS: 500.0000 mg | ORAL_TABLET | Freq: Two times a day (BID) | ORAL | 0 refills | Status: DC

## 2016-07-10 NOTE — Progress Notes (Addendum)
   Subjective:    Patient ID: [name], female    DOB: October 13, 1950, 65 y.o.   MRN: [id number]  Cough  This is a new problem. The current episode started in the past 7 days. Associated symptoms include nasal congestion and wheezing. Treatments tried: tylenol.   Four d gtadual worsening  Headache frontal worse with chang of position  Cough productive  Pos whiizines Patient notes cough is fairly severe at nighttime. She is strongly requesting a shot to get things rolling. Patient is on Intal SL and states her immune system is not as good as it should be  Review of Systems  Respiratory: Positive for cough and wheezing.    No vomiting or diarrhea    Objective:   Physical Exam Alert, mild malaise. Hydration good Vitals stable. frontal/ maxillary tenderness evident positive nasal congestion. pharynx normal neck supple  lungs clea or wheezes. heart regular in rhythm        Assessment & Plan:  Impression rhinosinusitis, Bronchitis likely post viral, discussed with patient. plan antibiotics prescribed. Questions answered. Symptomatic care discussed. warning signs discussed. WSL

## 2016-07-16 ENCOUNTER — Encounter (HOSPITAL_COMMUNITY): Payer: Self-pay | Admitting: Hematology & Oncology

## 2016-07-16 ENCOUNTER — Encounter (HOSPITAL_COMMUNITY): Payer: BLUE CROSS/BLUE SHIELD | Attending: Oncology | Admitting: Oncology

## 2016-07-16 ENCOUNTER — Encounter (HOSPITAL_COMMUNITY): Payer: BLUE CROSS/BLUE SHIELD | Admitting: Hematology & Oncology

## 2016-07-16 VITALS — BP 123/47 | HR 85 | Temp 98.0°F | Resp 16 | Wt 209.5 lb

## 2016-07-16 DIAGNOSIS — D500 Iron deficiency anemia secondary to blood loss (chronic): Secondary | ICD-10-CM | POA: Insufficient documentation

## 2016-07-16 DIAGNOSIS — D509 Iron deficiency anemia, unspecified: Secondary | ICD-10-CM

## 2016-07-16 DIAGNOSIS — E538 Deficiency of other specified B group vitamins: Secondary | ICD-10-CM

## 2016-07-16 LAB — CBC WITH DIFFERENTIAL/PLATELET
BASOS PCT: 1 %
Basophils Absolute: 0 10*3/uL (ref 0.0–0.1)
EOS ABS: 0.1 10*3/uL (ref 0.0–0.7)
EOS PCT: 1 %
HCT: 37.7 % (ref 36.0–46.0)
HEMOGLOBIN: 12.2 g/dL (ref 12.0–15.0)
Lymphocytes Relative: 22 %
Lymphs Abs: 1.9 10*3/uL (ref 0.7–4.0)
MCH: 28.5 pg (ref 26.0–34.0)
MCHC: 32.4 g/dL (ref 30.0–36.0)
MCV: 88.1 fL (ref 78.0–100.0)
Monocytes Absolute: 0.6 10*3/uL (ref 0.1–1.0)
Monocytes Relative: 7 %
NEUTROS PCT: 69 %
Neutro Abs: 6 10*3/uL (ref 1.7–7.7)
PLATELETS: 261 10*3/uL (ref 150–400)
RBC: 4.28 MIL/uL (ref 3.87–5.11)
RDW: 13.2 % (ref 11.5–15.5)
WBC: 8.5 10*3/uL (ref 4.0–10.5)

## 2016-07-16 LAB — FERRITIN: FERRITIN: 133 ng/mL (ref 11–307)

## 2016-07-16 LAB — VITAMIN B12: Vitamin B-12: 216 pg/mL (ref 180–914)

## 2016-07-16 LAB — BASIC METABOLIC PANEL
Anion gap: 7 (ref 5–15)
BUN: 22 mg/dL — AB (ref 6–20)
CHLORIDE: 105 mmol/L (ref 101–111)
CO2: 24 mmol/L (ref 22–32)
CREATININE: 0.62 mg/dL (ref 0.44–1.00)
Calcium: 8.6 mg/dL — ABNORMAL LOW (ref 8.9–10.3)
GFR calc Af Amer: >60 mL/min (ref >60)
GFR calc non Af Amer: >60 mL/min (ref >60)
GLUCOSE: 127 mg/dL — AB (ref 65–99)
Potassium: 3.4 mmol/L — ABNORMAL LOW (ref 3.5–5.1)
SODIUM: 136 mmol/L (ref 135–145)

## 2016-07-16 LAB — IRON AND TIBC
IRON: 23 ug/dL — AB (ref 28–170)
Saturation Ratios: 9 % — ABNORMAL LOW (ref 10.4–31.8)
TIBC: 245 ug/dL — AB (ref 250–450)
UIBC: 222 ug/dL

## 2016-07-16 NOTE — Patient Instructions (Signed)
[place] at [facility] Discharge Instructions  RECOMMENDATIONS MADE BY THE CONSULTANT AND ANY TEST RESULTS WILL BE SENT TO YOUR REFERRING PHYSICIAN.  You were seen today by Kirby Crigler PA-C. Labs today, will call with results. Return in 6 months for labs.  Return in 12 months for labs and follow up.   Thank you for choosing [place] at [facility] to provide your oncology and hematology care.  To afford each patient quality time with our provider, please arrive at least 15 minutes before your scheduled appointment time.   Beginning January 23rd 2017 lab work for the [facility] will be done in the  Main lab at [facility] on 1st floor. If you have a lab appointment with the [facility] please come in thru the  Main Entrance and check in at the main information desk  You need to re-schedule your appointment should you arrive 10 or more minutes late.  We strive to give you quality time with our providers, and arriving late affects you and other patients whose appointments are after yours.  Also, if you no show three or more times for appointments you may be dismissed from the clinic at the providers discretion.     Again, thank you for choosing [facility].  Our hope is that these requests will decrease the amount of time that you wait before being seen by our physicians.       _____________________________________________________________  Should you have questions after your visit to [facility], please contact our office at [phone] between the hours of 8:30 a.m. and 4:30 p.m.  Voicemails left after 4:30 p.m. will not be returned until the following business day.  For prescription refill requests, have your pharmacy contact our office.         Resources For Cancer Patients and their Caregivers ? American Cancer Society: Can assist with transportation, wigs, general needs, runs Look Good Feel Better.         ([phone] [phone] ? Cancer Care: Provides financial assistance, online support groups, medication/co-pay assistance.  1-800-813-HOPE (708) 887-0635) ? [facility] Assists [place] cancer patients and their families through emotional , educational and financial support.  850-282-2063 ? [place] DSS Where to apply for food stamps, Medicaid and utility assistance. 204 647 9596 ? RCATS: Transportation to medical appointments. (820)181-1162 ? Social Security Administration: May apply for disability if have a Stage IV cancer. [phone] ? [employer], Disability and Transit Services: Assists with nutrition, care and transit needs. [place] Support Programs: @10RELATIVEDAYS @ > Cancer Support Group  2nd Tuesday of the month 1pm-2pm, Journey Room  > Creative Journey  3rd Tuesday of the month 1130am-1pm, Journey Room  > Look Good Feel Better  1st Wednesday of the month 10am-12 noon, Journey Room (Call [place] to register [phone])

## 2016-07-16 NOTE — Assessment & Plan Note (Signed)
B12 deficiency, with negative antibody testing having been given weekly B12 x 4.  No on PO B12 replacement therapy.  Labs today: B12.  Labs in 12 months: B12

## 2016-07-16 NOTE — Progress Notes (Signed)
Sara Lange, MD [place] [address]  Iron deficiency anemia due to chronic blood loss - Plan: CBC with Differential, Iron and TIBC, Basic metabolic panel, Ferritin  B12 deficiency - Plan: CBC with Differential, Vitamin B12  CURRENT THERAPY: Last IV Feraheme infusion was on 12/09/2013.  INTERVAL HISTORY: [name] 65 y.o. female returns for followup of mixed anemia with contributions from malabsorption and chronic blood loss in the setting of Crohn's disease.  She is following with GI for her Crohn's disease.  She recently saw Walden Field, NP.  She is in remission from this standpoint without any recent exacerbations.  Her insurance expires on 07/29/2016.  She will be picking up Medicare in May 2018 when she turns 65 years old.  She could not afford the $1400 monthly payment for insurance coverage in the interim.  She is a little frustrated with not seeing Dr. Whitney Muse today, but she is understanding the reason for this today (we had to work-in an emergent patient to be seen).    She otherwise is doing well.  She denies any hematology complaints today.  Review of Systems  Constitutional: Negative.  Negative for chills, fever and weight loss.  HENT: Negative.   Respiratory: Negative.  Negative for cough.   Cardiovascular: Negative.  Negative for chest pain.  Gastrointestinal: Negative.  Negative for blood in stool and melena.  Genitourinary: Negative.  Negative for hematuria.  Musculoskeletal: Negative.   Skin: Negative.   Neurological: Negative.  Negative for weakness.  Endo/Heme/Allergies: Negative.   Psychiatric/Behavioral: Negative.     Past Medical History:  Diagnosis Date  . Arthritis   . B12 deficiency 07/09/2015  . Crohn disease ([place]) JULY 2011 FEDA   PERSISTENT TI ULCERS seen on CE, NO NSAID USE  . Endometriosis    HISTORY  LEADING TO HYSTERECTOMY  . Fibromyalgia   . Fibromyalgia 12/09/2013  . Gastric ulcer   . GERD  (gastroesophageal reflux disease)   . Headache   . Helicobacter pylori gastritis    HISTORY  . History of hiatal hernia   . Hives   . Iron (Fe) deficiency anemia 2009   JAN 2012 NL HB & FERRITIN  . Obesity   . Osteoporosis 11/20/2014  . Plantar fasciitis     Past Surgical History:  Procedure Laterality Date  . ABDOMINAL HYSTERECTOMY    . CATARACT EXTRACTION W/PHACO Right 12/05/2015   Procedure: CATARACT EXTRACTION PHACO AND INTRAOCULAR LENS PLACEMENT (IOC);  Surgeon: Birder Robson, MD;  Location: [facility] ORS;  Service: Ophthalmology;  Laterality: Right;  [place] 00:59.9 AP% 21.9 CDE 13.10 fluid pack lot # [id number]  . CATARACT EXTRACTION W/PHACO Left 06/24/2016   Procedure: CATARACT EXTRACTION PHACO AND INTRAOCULAR LENS PLACEMENT LEFT EYE; CDE:  8.33;  Surgeon: Tonny Branch, MD;  Location: [facility]S;  Service: Ophthalmology;  Laterality: Left;  . COLONOSCOPY    . ESOPHAGOGASTRODUODENOSCOPY  10/14/2007   Esophagus showed 2 cm pink tongue seen extending from the GE junction.  Biopsies obtained via cold forceps.  Otherwise the  esophagus was without erosions, mass, ulceration, or stricture / . Large hiatal hernia pouch with linear erosions, and two 3-6 mmulcers seen in the pouch.  Biopsies obtained via cold forceps to  evaluate for H. pylori gastritis or malignancy  . Ileocolonoscopy  10/14/2007   Multiple 1-3 mm terminal ileum ulcers biopsied  The ulcers seemed to only involve the last 5 cm of her terminal ileum, and 10 cm  of her ileum was visualized.  Otherwise the colon  was without polyps, masses, diverticula, or AVMs.  She had a normal retroflexed view of the rectum  . OVARIAN CYST REMOVAL    . TOTAL ABDOMINAL HYSTERECTOMY W/ BILATERAL SALPINGOOPHORECTOMY    . UPPER GASTROINTESTINAL ENDOSCOPY  JULY 2011 DIARRHEA/FEDA    Bx-H.PYLORI GASTRITIS, NL DUODENUM    Family History  Problem Relation Age of Onset  . Diabetes Mother   . Cancer Father   . Diabetes Father   . Diabetes Sister   .  Diabetes Brother   . Colon polyps Neg Hx   . Colon cancer Neg Hx   . Stroke Neg Hx     Social History   Social History  . Marital status: Married    Spouse name: Alvester Chou   . Number of children: 3  . Years of education: 12   Occupational History  . Retired    Social History Main Topics  . Smoking status: Never Smoker  . Smokeless tobacco: Never Used  . Alcohol use No  . Drug use: No  . Sexual activity: Yes    Birth control/ protection: Surgical   Other Topics Concern  . Not on file   Social History Narrative   Lives w/ husband   Caffeine use: Drinks 1-2 cups coffee per day     PHYSICAL EXAMINATION  ECOG PERFORMANCE STATUS: 0 - Asymptomatic  There were no vitals filed for this visit.  Vitals - 1 value per visit [id number]  SYSTOLIC [id number]  DIASTOLIC 47  Pulse 85  Temperature 98  Respirations 16    GENERAL:alert, no distress, comfortable, cooperative, obese, smiling and unaccompanied SKIN: skin color, texture, turgor are normal, no rashes or significant lesions HEAD: Normocephalic, No masses, lesions, tenderness or abnormalities EYES: normal, Conjunctiva are pink and non-injected EARS: External ears normal OROPHARYNX:lips, buccal mucosa, and tongue normal and mucous membranes are moist  NECK: supple, trachea midline LYMPH:  not examined BREAST:not examined LUNGS: clear to auscultation  HEART: regular rate & rhythm ABDOMEN:abdomen soft and obese BACK: Back symmetric, no curvature. EXTREMITIES:less then 2 second capillary refill, no joint deformities, effusion, or inflammation, no skin discoloration, no cyanosis  NEURO: alert & oriented x 3 with fluent speech, no focal motor/sensory deficits, gait normal   LABORATORY DATA: CBC    Component Value Date/Time   WBC 7.7 06/14/2016 1446   RBC 4.16 06/14/2016 1446   HGB 12.1 06/14/2016 1446   HCT 36.5 06/14/2016 1446   PLT 248 06/14/2016 1446   MCV 87.7 06/14/2016 1446   MCH 29.1 06/14/2016 1446   MCHC 33.2  06/14/2016 1446   RDW 13.4 06/14/2016 1446   LYMPHSABS 2.4 06/14/2016 1446   MONOABS 0.5 06/14/2016 1446   EOSABS 0.1 06/14/2016 1446   BASOSABS 0.0 06/14/2016 1446      Chemistry      Component Value Date/Time   NA 139 06/14/2016 1446   NA 141 09/06/2015 1303   K 3.5 06/14/2016 1446   CL 108 06/14/2016 1446   CO2 23 06/14/2016 1446   BUN 19 06/14/2016 1446   BUN 15 09/06/2015 1303   CREATININE 0.65 06/14/2016 1446   CREATININE 0.58 12/08/2014 1418      Component Value Date/Time   CALCIUM 8.7 (L) 06/14/2016 1446   ALKPHOS 92 03/09/2014 0711   AST 14 03/09/2014 0711   ALT 18 03/09/2014 0711   BILITOT 0.6 03/09/2014 0711     Lab Results  Component Value Date   IRON  30 01/02/2016   TIBC 262 01/02/2016   FERRITIN 100 01/02/2016   Lab Results  Component Value Date   VITAMINB12 178 (L) 07/07/2015     PENDING LABS:   RADIOGRAPHIC STUDIES:  No results found.   PATHOLOGY:    ASSESSMENT AND PLAN:  Iron deficiency anemia Mixed anemia with contributions from chronic blood loss and malabsorption, requiring intermittent IV iron.  Labs today: CBC diff, iron/TIBC, ferritin.  I personally reviewed and went over laboratory results with the patient.  The results are noted within this dictation.  Labs in 6 and 12 months: CBC diff, iron/TIBC, ferritin with the understanding that she will call in the interim for iron studies if needed based upon her symptoms.  Will administer IV iron when indicated.   She is losing her insurance on 07/29/2016.  Therefore, we will work around this issue.  She will be getting insurance in May 2018 when she is 65 yo.  Therefore, if she needs IV iron, we will need to do this prior to 07/29/2016.  Return in 12 months for follow-up.  B12 deficiency B12 deficiency, with negative antibody testing having been given weekly B12 x 4.  No on PO B12 replacement therapy.  Labs today: B12.  Labs in 12 months: B12   ORDERS PLACED FOR THIS  ENCOUNTER: No orders of the defined types were placed in this encounter.   MEDICATIONS PRESCRIBED THIS ENCOUNTER: No orders of the defined types were placed in this encounter.   THERAPY PLAN:  We will continue to monitor blood counts and vitamins with IV iron replacement when indicated.  All questions were answered. The patient knows to call the clinic with any problems, questions or concerns. We can certainly see the patient much sooner if necessary.  Patient and plan discussed with Dr. Ancil Linsey and she is in agreement with the aforementioned.   This note is electronically signed by: Doy Mince 07/16/2016 5:05 PM

## 2016-07-16 NOTE — Patient Instructions (Signed)
[facility] at [facility] Discharge Instructions  RECOMMENDATIONS MADE BY THE CONSULTANT AND ANY TEST RESULTS WILL BE SENT TO YOUR REFERRING PHYSICIAN.  You saw Dr.Penland today. See Amy at checkout for appointments.  Thank you for choosing [facility] at [facility] to provide your oncology and hematology care.  To afford each patient quality time with our provider, please arrive at least 15 minutes before your scheduled appointment time.   Beginning January 23rd 2017 lab work for the Cancer Center will be done in the  Main lab at [facility] on 1st floor. If you have a lab appointment with the Cancer Center please come in thru the  Main Entrance and check in at the main information desk  You need to re-schedule your appointment should you arrive 10 or more minutes late.  We strive to give you quality time with our providers, and arriving late affects you and other patients whose appointments are after yours.  Also, if you no show three or more times for appointments you may be dismissed from the clinic at the providers discretion.     Again, thank you for choosing [facility].  Our hope is that these requests will decrease the amount of time that you wait before being seen by our physicians.       _____________________________________________________________  Should you have questions after your visit to [facility], please contact our office at [phone] between the hours of 8:30 a.m. and 4:30 p.m.  Voicemails left after 4:30 p.m. will not be returned until the following business day.  For prescription refill requests, have your pharmacy contact our office.         Resources For Cancer Patients and their Caregivers ? American Cancer Society: Can assist with transportation, wigs, general needs, runs Look Good Feel Better.        1-888-227-6333 ? Cancer Care: Provides financial assistance, online support  groups, medication/co-pay assistance.  1-800-813-HOPE (4673) ? [facility] Assists [place] cancer patients and their families through emotional , educational and financial support.  336-427-4357 ? [place] DSS Where to apply for food stamps, Medicaid and utility assistance. 336-342-1394 ? [employer]: Transportation to medical appointments. 336-347-2287 ? Social Security Administration: May apply for disability if have a Stage IV cancer. 336-342-7796 1-800-772-1213 ? [place] Aging, Disability and Transit Services: Assists with nutrition, care and transit needs. 336-349-2343  Cancer Center Support Programs: @10RELATIVEDAYS@ > Cancer Support Group  2nd Tuesday of the month 1pm-2pm, Journey Room  > Creative Journey  3rd Tuesday of the month 1130am-1pm, Journey Room  > Look Good Feel Better  1st Wednesday of the month 10am-12 noon, Journey Room (Call [employer] to register 1-800-395-5775)    

## 2016-07-16 NOTE — Assessment & Plan Note (Addendum)
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  Labs today: CBC diff, iron/TIBC, ferritin.  I personally reviewed and went over laboratory results with the patient.  The results are noted within this dictation.  Labs in 6 and 12 months: CBC diff, iron/TIBC, ferritin with the understanding that she will call in the interim for iron studies if needed based upon her symptoms.  Will administer IV iron when indicated.   She is losing her insurance on 07/29/2016.  Therefore, we will work around this issue.  She will be getting insurance in May 2018 when she is 65 yo.  Therefore, if she needs IV iron, we will need to do this prior to 07/29/2016.  Return in 12 months for follow-up.

## 2016-07-17 ENCOUNTER — Other Ambulatory Visit (HOSPITAL_COMMUNITY): Payer: Self-pay | Admitting: Oncology

## 2016-07-17 ENCOUNTER — Telehealth (HOSPITAL_COMMUNITY): Payer: Self-pay | Admitting: Hematology & Oncology

## 2016-07-18 ENCOUNTER — Ambulatory Visit (HOSPITAL_COMMUNITY): Payer: Self-pay | Admitting: Hematology & Oncology

## 2016-07-20 NOTE — Progress Notes (Signed)
Patient seen by PA-C Kirby Crigler)

## 2016-07-24 ENCOUNTER — Telehealth: Payer: Self-pay | Admitting: Nurse Practitioner

## 2016-07-24 ENCOUNTER — Other Ambulatory Visit: Payer: Self-pay | Admitting: Nurse Practitioner

## 2016-07-24 MED ORDER — FLUCONAZOLE 150 MG PO TABS: ORAL_TABLET | ORAL | 0 refills | Status: DC

## 2016-07-24 NOTE — Telephone Encounter (Signed)
Patient is experiencing vaginal itching.  She believes she has a yeast infection and wants to know if we can call in Rx.   [facility]

## 2016-07-24 NOTE — Telephone Encounter (Signed)
Recent antibiotic use 

## 2016-07-25 ENCOUNTER — Ambulatory Visit (HOSPITAL_COMMUNITY): Payer: Self-pay

## 2016-07-25 ENCOUNTER — Telehealth (HOSPITAL_COMMUNITY): Payer: Self-pay

## 2016-07-25 NOTE — Telephone Encounter (Signed)
Left message regarding missed appt today for Iron infusion. Pt instructed to return call to reschedule this appt.

## 2016-07-26 ENCOUNTER — Encounter (HOSPITAL_BASED_OUTPATIENT_CLINIC_OR_DEPARTMENT_OTHER): Payer: BLUE CROSS/BLUE SHIELD

## 2016-07-26 VITALS — BP 122/59 | HR 91 | Temp 98.0°F

## 2016-07-26 DIAGNOSIS — D500 Iron deficiency anemia secondary to blood loss (chronic): Secondary | ICD-10-CM

## 2016-07-26 DIAGNOSIS — D509 Iron deficiency anemia, unspecified: Secondary | ICD-10-CM

## 2016-07-26 MED ORDER — SODIUM CHLORIDE 0.9 % IV SOLN
Freq: Once | INTRAVENOUS | Status: AC
  Administered 2016-07-26: 15:00:00 via INTRAVENOUS

## 2016-07-26 MED ORDER — FERUMOXYTOL INJECTION 510 MG/17 ML
510.0000 mg | Freq: Once | INTRAVENOUS | Status: AC
  Administered 2016-07-26: 510 mg via INTRAVENOUS
  Filled 2016-07-26: qty 17

## 2016-07-26 NOTE — Patient Instructions (Signed)
[facility] at [facility] Discharge Instructions  RECOMMENDATIONS MADE BY THE CONSULTANT AND ANY TEST RESULTS WILL BE SENT TO YOUR REFERRING PHYSICIAN.  Feraheme given today Follow up as scheduled.  Thank you for choosing [facility] at [facility] to provide your oncology and hematology care.  To afford each patient quality time with our provider, please arrive at least 15 minutes before your scheduled appointment time.    If you have a lab appointment with the Cancer Center please come in thru the  Main Entrance and check in at the main information desk  You need to re-schedule your appointment should you arrive 10 or more minutes late.  We strive to give you quality time with our providers, and arriving late affects you and other patients whose appointments are after yours.  Also, if you no show three or more times for appointments you may be dismissed from the clinic at the providers discretion.     Again, thank you for choosing [facility].  Our hope is that these requests will decrease the amount of time that you wait before being seen by our physicians.       _____________________________________________________________  Should you have questions after your visit to [facility], please contact our office at [phone] between the hours of 8:30 a.m. and 4:30 p.m.  Voicemails left after 4:30 p.m. will not be returned until the following business day.  For prescription refill requests, have your pharmacy contact our office.       Resources For Cancer Patients and their Caregivers ? American Cancer Society: Can assist with transportation, wigs, general needs, runs Look Good Feel Better.        1-888-227-6333 ? Cancer Care: Provides financial assistance, online support groups, medication/co-pay assistance.  1-800-813-HOPE (4673) ? [facility] Assists [place] cancer patients and  their families through emotional , educational and financial support.  336-427-4357 ? [place] DSS Where to apply for food stamps, Medicaid and utility assistance. 336-342-1394 ? [employer]: Transportation to medical appointments. 336-347-2287 ? Social Security Administration: May apply for disability if have a Stage IV cancer. 336-342-7796 1-800-772-1213 ? [place] Aging, Disability and Transit Services: Assists with nutrition, care and transit needs. 336-349-2343  Cancer Center Support Programs: @10RELATIVEDAYS@ > Cancer Support Group  2nd Tuesday of the month 1pm-2pm, Journey Room  > Creative Journey  3rd Tuesday of the month 1130am-1pm, Journey Room  > Look Good Feel Better  1st Wednesday of the month 10am-12 noon, Journey Room (Call [employer] to register 1-800-395-5775)   

## 2016-07-26 NOTE — Progress Notes (Signed)
Feraheme given today , tolerated it well. No problems. Vitals stable and discharged home from clinic ambulatory.

## 2016-10-29 ENCOUNTER — Encounter: Payer: Self-pay | Admitting: Gastroenterology

## 2016-11-17 ENCOUNTER — Emergency Department
Admission: EM | Admit: 2016-11-17 | Discharge: 2016-11-17 | Disposition: A | Discharge status: Home / Self Care | Payer: Self-pay | Attending: Emergency Medicine | Admitting: Emergency Medicine

## 2016-11-17 ENCOUNTER — Encounter: Payer: Self-pay | Admitting: Emergency Medicine

## 2016-11-17 ENCOUNTER — Emergency Department: Payer: Self-pay

## 2016-11-17 DIAGNOSIS — I8002 Phlebitis and thrombophlebitis of superficial vessels of left lower extremity: Secondary | ICD-10-CM | POA: Insufficient documentation

## 2016-11-17 DIAGNOSIS — Z79899 Other long term (current) drug therapy: Secondary | ICD-10-CM | POA: Insufficient documentation

## 2016-11-17 DIAGNOSIS — I809 Phlebitis and thrombophlebitis of unspecified site: Secondary | ICD-10-CM

## 2016-11-17 DIAGNOSIS — R609 Edema, unspecified: Secondary | ICD-10-CM

## 2016-11-17 LAB — CBC WITH DIFFERENTIAL/PLATELET
Basophils Absolute: 0.1 10*3/uL (ref 0–0.1)
Basophils Relative: 1 %
EOS PCT: 2 %
Eosinophils Absolute: 0.2 10*3/uL (ref 0–0.7)
HEMATOCRIT: 38.6 % (ref 35.0–47.0)
Hemoglobin: 13.3 g/dL (ref 12.0–16.0)
LYMPHS PCT: 22 %
Lymphs Abs: 2.2 10*3/uL (ref 1.0–3.6)
MCH: 29.3 pg (ref 26.0–34.0)
MCHC: 34.5 g/dL (ref 32.0–36.0)
MCV: 85 fL (ref 80.0–100.0)
MONO ABS: 0.7 10*3/uL (ref 0.2–0.9)
Monocytes Relative: 8 %
Neutro Abs: 6.6 10*3/uL — ABNORMAL HIGH (ref 1.4–6.5)
Neutrophils Relative %: 67 %
Platelets: 261 10*3/uL (ref 150–440)
RBC: 4.55 MIL/uL (ref 3.80–5.20)
RDW: 13 % (ref 11.5–14.5)
WBC: 9.7 10*3/uL (ref 3.6–11.0)

## 2016-11-17 LAB — COMPREHENSIVE METABOLIC PANEL
ALK PHOS: 109 U/L (ref 38–126)
ALT: 26 U/L (ref 14–54)
ANION GAP: 7 (ref 5–15)
AST: 25 U/L (ref 15–41)
Albumin: 3.8 g/dL (ref 3.5–5.0)
BILIRUBIN TOTAL: 0.3 mg/dL (ref 0.3–1.2)
BUN: 13 mg/dL (ref 6–20)
CALCIUM: 8.7 mg/dL — AB (ref 8.9–10.3)
CO2: 24 mmol/L (ref 22–32)
Chloride: 105 mmol/L (ref 101–111)
Creatinine, Ser: 0.56 mg/dL (ref 0.44–1.00)
GFR calc Af Amer: >60 mL/min (ref >60)
GLUCOSE: 109 mg/dL — AB (ref 65–99)
POTASSIUM: 3.7 mmol/L (ref 3.5–5.1)
Sodium: 136 mmol/L (ref 135–145)
TOTAL PROTEIN: 7.5 g/dL (ref 6.5–8.1)

## 2016-11-17 LAB — LACTIC ACID, PLASMA: Lactic Acid, Venous: 1 mmol/L (ref 0.5–1.9)

## 2016-11-17 NOTE — ED Provider Notes (Signed)
[facility] Emergency Department Provider Note       Time seen: ----------------------------------------- 6:36 PM on 11/17/2016 -----------------------------------------     I have reviewed the triage vital signs and the nursing notes.   HISTORY   Chief Complaint Leg Pain    HPI [name] is a 66 y.o. female who presents to the ED for redness and swelling to left thigh. Left eye area is painful and hot to touch. Patient is currently taking clindamycin for lower leg not with redness and swelling for the last 6 days. Primary care doctor told her that if the redness spread upward to come to the ER. She denies fevers, chills or other complaints. Pain is currently 7 out of 10 in the lower leg that she describes as burning.   Past Medical History:  Diagnosis Date  . Arthritis   . B12 deficiency 07/09/2015  . Crohn disease ([place]) JULY 2011 FEDA   PERSISTENT TI ULCERS seen on CE, NO NSAID USE  . Endometriosis    HISTORY  LEADING TO HYSTERECTOMY  . Fibromyalgia   . Fibromyalgia 12/09/2013  . Gastric ulcer   . GERD (gastroesophageal reflux disease)   . Headache   . Helicobacter pylori gastritis    HISTORY  . History of hiatal hernia   . Hives   . Iron (Fe) deficiency anemia 2009   JAN 2012 NL HB & FERRITIN  . Obesity   . Osteoporosis 11/20/2014  . Plantar fasciitis     Patient Active Problem List   Diagnosis Date Noted  . [place] headache 09/06/2015  . Vision changes 09/06/2015  . Dizziness 09/06/2015  . New onset of headaches after age 18 09/06/2015  . B12 deficiency 07/09/2015  . Acute bilateral lower abdominal pain 12/08/2014  . Osteoporosis 11/20/2014  . Chronic back pain 11/10/2014  . GERD (gastroesophageal reflux disease) 06/16/2014  . Impaired fasting glucose 03/29/2014  . Fibromyalgia 12/09/2013  . Crohn's ileitis ([place]) 01/17/2011  . Iron deficiency anemia 04/10/2010    Past Surgical History:  Procedure Laterality Date  .  ABDOMINAL HYSTERECTOMY    . CATARACT EXTRACTION W/PHACO Right 12/05/2015   Procedure: CATARACT EXTRACTION PHACO AND INTRAOCULAR LENS PLACEMENT (IOC);  Surgeon: Birder Robson, MD;  Location: [facility] ORS;  Service: Ophthalmology;  Laterality: Right;  [place] 00:59.9 AP% 21.9 CDE 13.10 fluid pack lot # [id number]  . CATARACT EXTRACTION W/PHACO Left 06/24/2016   Procedure: CATARACT EXTRACTION PHACO AND INTRAOCULAR LENS PLACEMENT LEFT EYE; CDE:  8.33;  Surgeon: Tonny Branch, MD;  Location: [facility];  Service: Ophthalmology;  Laterality: Left;  . COLONOSCOPY    . ESOPHAGOGASTRODUODENOSCOPY  10/14/2007   Esophagus showed 2 cm pink tongue seen extending from the GE junction.  Biopsies obtained via cold forceps.  Otherwise the  esophagus was without erosions, mass, ulceration, or stricture / . Large hiatal hernia pouch with linear erosions, and two 3-6 mmulcers seen in the pouch.  Biopsies obtained via cold forceps to  evaluate for H. pylori gastritis or malignancy  . Ileocolonoscopy  10/14/2007   Multiple 1-3 mm terminal ileum ulcers biopsied  The ulcers seemed to only involve the last 5 cm of her terminal ileum, and 10 cm of her ileum was visualized.  Otherwise the colon  was without polyps, masses, diverticula, or AVMs.  She had a normal retroflexed view of the rectum  . OVARIAN CYST REMOVAL    . TOTAL ABDOMINAL HYSTERECTOMY W/ BILATERAL SALPINGOOPHORECTOMY    . UPPER GASTROINTESTINAL ENDOSCOPY  JULY 2011  DIARRHEA/FEDA    Bx-H.PYLORI GASTRITIS, NL DUODENUM    Allergies Celebrex [celecoxib] and Sulfonamide derivatives  Social History Social History  Substance Use Topics  . Smoking status: Never Smoker  . Smokeless tobacco: Never Used  . Alcohol use No    Review of Systems Constitutional: Negative for fever. Cardiovascular: Negative for chest pain. Respiratory: Negative for shortness of breath. Gastrointestinal: Negative for abdominal pain, vomiting and diarrhea. Musculoskeletal: Positive for left leg  pain Skin: Negative for rash. Neurological: Negative for headaches, focal weakness or numbness.  10-point ROS otherwise negative.  ____________________________________________   PHYSICAL EXAM:  VITAL SIGNS: ED Triage Vitals  Enc Vitals Group     BP 11/17/16 1611 (!) 162/63     Pulse Rate 11/17/16 1611 85     Resp 11/17/16 1611 18     Temp 11/17/16 1611 97.9 F (36.6 C)     Temp Source 11/17/16 1611 Oral     SpO2 11/17/16 1611 95 %     Weight 11/17/16 1612 190 lb (86.2 kg)     Height 11/17/16 1612 5\' 5"  (1.651 m)     Head Circumference --      Peak Flow --      Pain Score 11/17/16 1626 7     Pain Loc --      Pain Edu? --      Excl. in [place]? --     Constitutional: Alert and oriented. Well appearing and in no distress. Eyes: Conjunctivae are normal. Normal extraocular movements. Cardiovascular: Normal rate, regular rhythm. No murmurs, rubs, or gallops. Respiratory: Normal respiratory effort without tachypnea nor retractions. Breath sounds are clear and equal bilaterally. No wheezes/rales/rhonchi. Gastrointestinal: Soft and nontender. Normal bowel sounds Musculoskeletal: Palpable cords are appreciated beginning in the left lower leg medially up towards the popliteal area. Some erythema is noted around the left upper leg Neurologic:  Normal speech and language. No gross focal neurologic deficits are appreciated.  Skin:  Mild left leg erythema Psychiatric: Mood and affect are normal. Speech and behavior are normal.  ____________________________________________  ED COURSE:  Pertinent labs & imaging results that were available during my care of the patient were reviewed by me and considered in my medical decision making (see chart for details). Patient presents for leg pain, we will assess with labs and imaging as indicated.   Procedures ____________________________________________   LABS (pertinent positives/negatives)  Labs Reviewed  COMPREHENSIVE METABOLIC PANEL -  Abnormal; Notable for the following:       Result Value   Glucose, Bld 109 (*)    Calcium 8.7 (*)    All other components within normal limits  CBC WITH DIFFERENTIAL/PLATELET - Abnormal; Notable for the following:    Neutro Abs 6.6 (*)    All other components within normal limits  LACTIC ACID, PLASMA  LACTIC ACID, PLASMA    RADIOLOGY Left lower extremity ultrasound IMPRESSION: 1. No evidence of deep venous thrombosis in the left lower extremity. 2. However, there is superficial vein thrombosis. Specifically, nearly occlusive thrombus is noted in the left greater saphenous vein.  ____________________________________________  FINAL ASSESSMENT AND PLAN  Superficial phlebitis  Plan: Patient's labs and imaging were dictated above. Patient had presented for leg pain which apparently is from superficial phlebitis. Advise taking a baby aspirin and anti-inflammatory medications as well as warm compresses. She is stable for outpatient follow-up with her doctor.   Earleen Newport, MD   Note: This note was generated in part or whole with voice recognition software. Voice  recognition is usually quite accurate but there are transcription errors that can and very often do occur. I apologize for any typographical errors that were not detected and corrected.     Earleen Newport, MD 11/17/16 [phone]

## 2016-11-17 NOTE — ED Triage Notes (Signed)
Pt presents to ED c/o redness and swelling to L thigh, area is painful and hot to touch. Pt is on clindamycin for lower leg "knots" with redness and swelling since 11/11/16. Pt's PCP told pt if redness spreads upwards to come to ED.

## 2016-11-17 NOTE — ED Notes (Signed)
Consulted Dr. Jimmye Norman for [place] of LLE. Order transcribed.

## 2016-12-19 DIAGNOSIS — I889 Nonspecific lymphadenitis, unspecified: Secondary | ICD-10-CM | POA: Diagnosis not present

## 2017-01-15 ENCOUNTER — Encounter (HOSPITAL_COMMUNITY): Payer: Medicare Other | Attending: Oncology

## 2017-01-15 DIAGNOSIS — D500 Iron deficiency anemia secondary to blood loss (chronic): Secondary | ICD-10-CM | POA: Insufficient documentation

## 2017-01-15 LAB — CBC WITH DIFFERENTIAL/PLATELET
BASOS PCT: 0 %
Basophils Absolute: 0 10*3/uL (ref 0.0–0.1)
EOS ABS: 0.1 10*3/uL (ref 0.0–0.7)
Eosinophils Relative: 1 %
HEMATOCRIT: 40.9 % (ref 36.0–46.0)
HEMOGLOBIN: 13.7 g/dL (ref 12.0–15.0)
Lymphocytes Relative: 29 %
Lymphs Abs: 2.1 10*3/uL (ref 0.7–4.0)
MCH: 29 pg (ref 26.0–34.0)
MCHC: 33.5 g/dL (ref 30.0–36.0)
MCV: 86.5 fL (ref 78.0–100.0)
Monocytes Absolute: 0.7 10*3/uL (ref 0.1–1.0)
Monocytes Relative: 9 %
NEUTROS ABS: 4.3 10*3/uL (ref 1.7–7.7)
NEUTROS PCT: 61 %
Platelets: 210 10*3/uL (ref 150–400)
RBC: 4.73 MIL/uL (ref 3.87–5.11)
RDW: 13.2 % (ref 11.5–15.5)
WBC: 7.1 10*3/uL (ref 4.0–10.5)

## 2017-01-15 LAB — IRON AND TIBC
IRON: 53 ug/dL (ref 28–170)
SATURATION RATIOS: 22 % (ref 10.4–31.8)
TIBC: 246 ug/dL — AB (ref 250–450)
UIBC: 193 ug/dL

## 2017-01-15 LAB — BASIC METABOLIC PANEL
ANION GAP: 6 (ref 5–15)
BUN: 14 mg/dL (ref 6–20)
CHLORIDE: 106 mmol/L (ref 101–111)
CO2: 25 mmol/L (ref 22–32)
Calcium: 8.7 mg/dL — ABNORMAL LOW (ref 8.9–10.3)
Creatinine, Ser: 0.56 mg/dL (ref 0.44–1.00)
GFR calc non Af Amer: >60 mL/min (ref >60)
Glucose, Bld: 104 mg/dL — ABNORMAL HIGH (ref 65–99)
POTASSIUM: 3.9 mmol/L (ref 3.5–5.1)
SODIUM: 137 mmol/L (ref 135–145)

## 2017-01-15 LAB — FERRITIN: FERRITIN: 134 ng/mL (ref 11–307)

## 2017-05-05 ENCOUNTER — Encounter: Payer: Self-pay | Admitting: Family Medicine

## 2017-05-05 ENCOUNTER — Ambulatory Visit (INDEPENDENT_AMBULATORY_CARE_PROVIDER_SITE_OTHER): Payer: Medicare Other | Admitting: Family Medicine

## 2017-05-05 VITALS — BP 118/74 | Temp 98.0°F | Ht 65.0 in | Wt 212.0 lb

## 2017-05-05 DIAGNOSIS — R350 Frequency of micturition: Secondary | ICD-10-CM

## 2017-05-05 DIAGNOSIS — R300 Dysuria: Secondary | ICD-10-CM | POA: Diagnosis not present

## 2017-05-05 DIAGNOSIS — R109 Unspecified abdominal pain: Secondary | ICD-10-CM

## 2017-05-05 MED ORDER — CEFPROZIL 500 MG PO TABS: 500.0000 mg | ORAL_TABLET | Freq: Two times a day (BID) | ORAL | 0 refills | Status: DC

## 2017-05-05 NOTE — Progress Notes (Signed)
   Subjective:    Patient ID: [name], female    DOB: 03-19-1951, 66 y.o.   MRN: [id number]  Abdominal Pain  This is a new problem. Episode onset: 4 days. Associated symptoms include dysuria and a fever. She has tried nothing for the symptoms.   Patient relates dysuria urinary frequency lower abdominal discomfort denies high fever chills sweats denies flank pain denies vomiting diarrhea   Review of Systems  Constitutional: Positive for fever.  Gastrointestinal: Positive for abdominal pain.  Genitourinary: Positive for dysuria.  No chest congestion no headaches     Objective:   Physical Exam Lungs clear no crackles respiratory rate is normal Heart regular no murmurs Abdomen is soft minimal lower abdominal tenderness non-specific in the midline region. Patient does not appear toxic       Assessment & Plan:  Lower abdominal discomfort with urinary frequency probable UTI antibiotics prescribed-patient unable to give urine for urinalysis/urine culture patient does not appear toxic lab work not indicated if not improving over the next 3 days to follow-up follow-up sooner problems

## 2017-05-06 ENCOUNTER — Encounter: Payer: Self-pay | Admitting: Family Medicine

## 2017-05-06 ENCOUNTER — Telehealth: Payer: Self-pay | Admitting: Family Medicine

## 2017-05-06 MED ORDER — NITROFURANTOIN MONOHYD MACRO 100 MG PO CAPS: 100.0000 mg | ORAL_CAPSULE | Freq: Two times a day (BID) | ORAL | 0 refills | Status: DC

## 2017-05-06 NOTE — Telephone Encounter (Signed)
Macrobid 100 mg 1 twice a day for 7 days follow-up if ongoing troubles

## 2017-05-06 NOTE — Telephone Encounter (Signed)
Patient seen Dr. Nicki Reaper yesterday and was prescribed cefprozil for her dysuria.  She said that this has caused her to break out and itch and wants something else called in.   [facility]

## 2017-05-06 NOTE — Telephone Encounter (Signed)
Prescription sent electronically to pharmacy. Patient notified. 

## 2017-05-06 NOTE — Telephone Encounter (Signed)
Patient states she took one of the cefprozil last night and broke out in a rash on her hands and arms. It was so bad that she could not sleep. Would like another medication called to her pharmacy. Please advise.Thanks.

## 2017-05-07 ENCOUNTER — Encounter: Payer: Self-pay | Admitting: *Deleted

## 2017-05-21 ENCOUNTER — Telehealth: Payer: Self-pay | Admitting: Gastroenterology

## 2017-05-21 NOTE — Telephone Encounter (Signed)
Pt is due for a follow up visit. Her last avs states she should follow up in 6-12 months.

## 2017-05-21 NOTE — Telephone Encounter (Signed)
Pt called to make OV to discuss changing her medication. She was last seen in Nov 2017. Would she need an appointment or can the nurse call her with recommendations about her Pentasa and getting it out of a Tier 1. I have scheduled her an OV with EG, but wasn't sure if she would really need to come in. Please call patient and advise. [phone] or [phone]

## 2017-06-25 ENCOUNTER — Encounter: Payer: Self-pay | Admitting: Gastroenterology

## 2017-06-25 ENCOUNTER — Ambulatory Visit (INDEPENDENT_AMBULATORY_CARE_PROVIDER_SITE_OTHER): Payer: Medicare Other | Admitting: Nurse Practitioner

## 2017-06-25 ENCOUNTER — Encounter: Payer: Self-pay | Admitting: Nurse Practitioner

## 2017-06-25 VITALS — BP 139/76 | HR 68 | Temp 97.0°F | Ht 65.0 in | Wt 212.8 lb

## 2017-06-25 DIAGNOSIS — K5000 Crohn's disease of small intestine without complications: Secondary | ICD-10-CM

## 2017-06-25 NOTE — Progress Notes (Addendum)
REVIEWED-NO ADDITIONAL RECOMMENDATIONS.  Referring Provider: Kathyrn Drown, MD Primary Care Physician:  Kathyrn Drown, MD Primary GI:  Dr. Oneida Alar  Chief Complaint  Patient presents with  . Crohn's Disease    wants to discuss Pentasa    HPI:   [name] is a 66 y.o. female who presents to discuss her medications and for follow-up on Crohn's disease. The patient was last seen in our office 06/05/2016 for Crohn's disease and GERD.  Last visit she was doing well overall, worried about insurance premium for this coming year.  Noted occasional/rare diarrhea about 1-2 times a month.  If no other red flag/warning signs or symptoms.  History of anemia and fibromyalgia which contributes to her overall well-being but feels that she is doing pretty good.  No adverse effects from Pentasa.  No other GI symptoms.  Recommended routine labs including CBC and CMP, refill Pentasa, return for follow-up in 6-12 months.  CBC and hepatic function panel were never completed.  Another CBC completed a week later was completely normal.  Basic metabolic panel completed at the same time was also normal.  Iron studies completed shortly thereafter found decreased iron, TIBC, iron saturation.  Ferritin was normal at 133.  She did eventually have a complete metabolic panel on 4/49/6759 which found normal electrolytes, normal kidney and liver function.  Today she states she's doing well. Rare abdominal pain. Denies N/V, hematochezia, melena, fever, chills, unintentional weight loss. Her energy is intermittent likely due to chronic anemia and fibromyalgia. She is having difficulties affording her Pentasa, now on Medicare. She has cut back on her medication to once daily.   Crohn's was diagnosed by capsule endoscopy which found distal/terminal ileum ulcerations consistent with Crohn's disease.  Past Medical History:  Diagnosis Date  . Arthritis   . B12 deficiency 07/09/2015  . Crohn disease ([place]) JULY 2011 FEDA   PERSISTENT TI ULCERS seen on CE, NO NSAID USE  . Endometriosis    HISTORY  LEADING TO HYSTERECTOMY  . Fibromyalgia   . Fibromyalgia 12/09/2013  . Gastric ulcer   . GERD (gastroesophageal reflux disease)   . Headache   . Helicobacter pylori gastritis    HISTORY  . History of hiatal hernia   . Hives   . Iron (Fe) deficiency anemia 2009   JAN 2012 NL HB & FERRITIN  . Obesity   . Osteoporosis 11/20/2014  . Plantar fasciitis     Past Surgical History:  Procedure Laterality Date  . ABDOMINAL HYSTERECTOMY    . CATARACT EXTRACTION W/PHACO Right 12/05/2015   Procedure: CATARACT EXTRACTION PHACO AND INTRAOCULAR LENS PLACEMENT (IOC);  Surgeon: Birder Robson, MD;  Location: [facility] ORS;  Service: Ophthalmology;  Laterality: Right;  [place] 00:59.9 AP% 21.9 CDE 13.10 fluid pack lot # [id number]  . CATARACT EXTRACTION W/PHACO Left 06/24/2016   Procedure: CATARACT EXTRACTION PHACO AND INTRAOCULAR LENS PLACEMENT LEFT EYE; CDE:  8.33;  Surgeon: Tonny Branch, MD;  Location: [facility];  Service: Ophthalmology;  Laterality: Left;  . COLONOSCOPY    . ESOPHAGOGASTRODUODENOSCOPY  10/14/2007   Esophagus showed 2 cm pink tongue seen extending from the GE junction.  Biopsies obtained via cold forceps.  Otherwise the  esophagus was without erosions, mass, ulceration, or stricture / . Large hiatal hernia pouch with linear erosions, and two 3-6 mmulcers seen in the pouch.  Biopsies obtained via cold forceps to  evaluate for H. pylori gastritis or malignancy  . Ileocolonoscopy  10/14/2007   Multiple 1-3 mm terminal ileum  ulcers biopsied  The ulcers seemed to only involve the last 5 cm of her terminal ileum, and 10 cm of her ileum was visualized.  Otherwise the colon  was without polyps, masses, diverticula, or AVMs.  She had a normal retroflexed view of the rectum  . OVARIAN CYST REMOVAL    . TOTAL ABDOMINAL HYSTERECTOMY W/ BILATERAL SALPINGOOPHORECTOMY    . UPPER GASTROINTESTINAL ENDOSCOPY  JULY 2011 DIARRHEA/FEDA     Bx-H.PYLORI GASTRITIS, NL DUODENUM    Current Outpatient Medications  Medication Sig Dispense Refill  . acetaminophen (TYLENOL) 325 MG tablet Take 650 mg by mouth as needed for moderate pain or headache.     . mesalamine (PENTASA) 500 MG CR capsule Take 2 capsules (1,000 mg total) by mouth 4 (four) times daily. (Patient taking differently: Take 500 mg by mouth daily. ) 720 capsule 1  . omeprazole (PRILOSEC) 20 MG capsule TAKE 1 CAPSULE (20 MG TOTAL) BY MOUTH DAILY. 31 capsule 11   No current facility-administered medications for this visit.     Allergies as of 06/25/2017 - Review Complete 06/25/2017  Allergen Reaction Noted  . Cefzil [cefprozil] Rash 05/06/2017  . Celebrex [celecoxib] Hives and Itching 01/07/2011  . Sulfonamide derivatives Itching     Family History  Problem Relation Age of Onset  . Diabetes Mother   . Cancer Father   . Diabetes Father   . Diabetes Sister   . Diabetes Brother   . Colon polyps Neg Hx   . Colon cancer Neg Hx   . Stroke Neg Hx     Social History   Socioeconomic History  . Marital status: Married    Spouse name: Alvester Chou   . Number of children: 3  . Years of education: 84  . Highest education level: None  Social Needs  . Financial resource strain: None  . Food insecurity - worry: None  . Food insecurity - inability: None  . Transportation needs - medical: None  . Transportation needs - non-medical: None  Occupational History  . Occupation: Retired  Tobacco Use  . Smoking status: Never Smoker  . Smokeless tobacco: Never Used  Substance and Sexual Activity  . Alcohol use: No  . Drug use: No  . Sexual activity: Yes    Birth control/protection: Surgical  Other Topics Concern  . None  Social History Narrative   Lives w/ husband   Caffeine use: Drinks 1-2 cups coffee per day    Review of Systems: Complete ROS negative except as per HPI.   Physical Exam: BP 139/76   Pulse 68   Temp (!) 97 F (36.1 C) (Oral)   Ht 5\' 5"  (1.651  m)   Wt 212 lb 12.8 oz (96.5 kg)   BMI 35.41 kg/m  General:   Alert and oriented. Pleasant and cooperative. Well-nourished and well-developed.  Eyes:  Without icterus, sclera clear and conjunctiva pink.  Ears:  Normal auditory acuity. Cardiovascular:  S1, S2 present without murmurs appreciated. Normal pulses noted. Extremities without clubbing or edema. Respiratory:  Clear to auscultation bilaterally. No wheezes, rales, or rhonchi. No distress.  Gastrointestinal:  +BS, soft, non-tender and non-distended. No HSM noted. No guarding or rebound. No masses appreciated.  Rectal:  Deferred  Musculoskalatal:  Symmetrical without gross deformities. Normal posture. Neurologic:  Alert and oriented x4;  grossly normal neurologically. Psych:  Alert and cooperative. Normal mood and affect. Heme/Lymph/Immune: No excessive bruising noted.    06/25/2017 9:10 AM   Disclaimer: This note was dictated with voice recognition  software. Similar sounding words can inadvertently be transcribed and may not be corrected upon review.

## 2017-06-25 NOTE — Assessment & Plan Note (Signed)
She has Crohn's ileitis.  She is on Pentasa with small bowel release which is worked well for her.  However, she is recently converted to Medicare and Pentasa is prohibitively expensive on Medicare.  There are limited options related to her insurance tears versus likely effectiveness.  There is a possibility of sulfasalazine which is tier 3 compared to tear for for Pentasa.  At this point I will check her routine labs including CBC, CMP.  She will be due for colonoscopy next year.  I will discussed with Dr. Oneida Alar about changing her to sulfasalazine for affordability and follow-up colonoscopy to check for effectiveness on endoscopic exam.  Return for follow-up in 2 months.  I will give her samples of Pentasa to help extend her treatment of Pentasa for another month.

## 2017-06-25 NOTE — Patient Instructions (Signed)
1. I am giving you samples of Pentasa to help ensure you have medication. 2. I will discuss her situation with Dr. Oneida Alar. 3. We will call you with an updated plan which could include changing her medications. 4. Return for follow-up in 2 months.  We may be scheduling a colonoscopy for 2019 when you return for a visit. 5. Call if you have any questions or concerns.

## 2017-06-25 NOTE — Addendum Note (Signed)
Addended by: Gordy Levan, Hamish Banks A on: 06/25/2017 09:45 AM   Modules accepted: Orders

## 2017-06-25 NOTE — Progress Notes (Signed)
cc'ed to pcp °

## 2017-06-26 DIAGNOSIS — M722 Plantar fascial fibromatosis: Secondary | ICD-10-CM | POA: Diagnosis not present

## 2017-06-26 DIAGNOSIS — M79672 Pain in left foot: Secondary | ICD-10-CM | POA: Diagnosis not present

## 2017-07-17 ENCOUNTER — Other Ambulatory Visit (HOSPITAL_COMMUNITY): Payer: Self-pay

## 2017-07-17 ENCOUNTER — Ambulatory Visit (HOSPITAL_COMMUNITY): Payer: Self-pay

## 2017-07-30 ENCOUNTER — Other Ambulatory Visit (HOSPITAL_COMMUNITY)
Admission: RE | Admit: 2017-07-30 | Discharge: 2017-07-30 | Disposition: A | Discharge status: Home / Self Care | Payer: Medicare Other | Source: Ambulatory Visit | Attending: Nurse Practitioner | Admitting: Nurse Practitioner

## 2017-07-30 ENCOUNTER — Inpatient Hospital Stay (HOSPITAL_COMMUNITY): Payer: Medicare Other | Attending: Nurse Practitioner

## 2017-07-30 DIAGNOSIS — M1990 Unspecified osteoarthritis, unspecified site: Secondary | ICD-10-CM | POA: Diagnosis not present

## 2017-07-30 DIAGNOSIS — Z8719 Personal history of other diseases of the digestive system: Secondary | ICD-10-CM | POA: Diagnosis not present

## 2017-07-30 DIAGNOSIS — K5090 Crohn's disease, unspecified, without complications: Secondary | ICD-10-CM | POA: Insufficient documentation

## 2017-07-30 DIAGNOSIS — Z9842 Cataract extraction status, left eye: Secondary | ICD-10-CM | POA: Diagnosis not present

## 2017-07-30 DIAGNOSIS — K219 Gastro-esophageal reflux disease without esophagitis: Secondary | ICD-10-CM | POA: Diagnosis not present

## 2017-07-30 DIAGNOSIS — K909 Intestinal malabsorption, unspecified: Secondary | ICD-10-CM | POA: Insufficient documentation

## 2017-07-30 DIAGNOSIS — Z79899 Other long term (current) drug therapy: Secondary | ICD-10-CM | POA: Diagnosis not present

## 2017-07-30 DIAGNOSIS — M797 Fibromyalgia: Secondary | ICD-10-CM | POA: Diagnosis not present

## 2017-07-30 DIAGNOSIS — E669 Obesity, unspecified: Secondary | ICD-10-CM | POA: Diagnosis not present

## 2017-07-30 DIAGNOSIS — E538 Deficiency of other specified B group vitamins: Secondary | ICD-10-CM | POA: Diagnosis not present

## 2017-07-30 DIAGNOSIS — K449 Diaphragmatic hernia without obstruction or gangrene: Secondary | ICD-10-CM | POA: Diagnosis not present

## 2017-07-30 DIAGNOSIS — D500 Iron deficiency anemia secondary to blood loss (chronic): Secondary | ICD-10-CM | POA: Insufficient documentation

## 2017-07-30 DIAGNOSIS — M722 Plantar fascial fibromatosis: Secondary | ICD-10-CM | POA: Diagnosis not present

## 2017-07-30 DIAGNOSIS — K5000 Crohn's disease of small intestine without complications: Secondary | ICD-10-CM | POA: Insufficient documentation

## 2017-07-30 LAB — BASIC METABOLIC PANEL
Anion gap: 14 (ref 5–15)
BUN: 15 mg/dL (ref 6–20)
CO2: 21 mmol/L — ABNORMAL LOW (ref 22–32)
Calcium: 8.9 mg/dL (ref 8.9–10.3)
Chloride: 105 mmol/L (ref 101–111)
Creatinine, Ser: 0.56 mg/dL (ref 0.44–1.00)
GFR calc Af Amer: >60 mL/min (ref >60)
GFR calc non Af Amer: >60 mL/min (ref >60)
Glucose, Bld: 124 mg/dL — ABNORMAL HIGH (ref 65–99)
POTASSIUM: 3.4 mmol/L — AB (ref 3.5–5.1)
SODIUM: 140 mmol/L (ref 135–145)

## 2017-07-30 LAB — IRON AND TIBC
IRON: 27 ug/dL — AB (ref 28–170)
Saturation Ratios: 9 % — ABNORMAL LOW (ref 10.4–31.8)
TIBC: 287 ug/dL (ref 250–450)
UIBC: 260 ug/dL

## 2017-07-30 LAB — HEPATIC FUNCTION PANEL
ALT: 20 U/L (ref 14–54)
AST: 20 U/L (ref 15–41)
Albumin: 3.6 g/dL (ref 3.5–5.0)
Alkaline Phosphatase: 100 U/L (ref 38–126)
BILIRUBIN DIRECT: 0.1 mg/dL (ref 0.1–0.5)
BILIRUBIN INDIRECT: 0.2 mg/dL — AB (ref 0.3–0.9)
BILIRUBIN TOTAL: 0.3 mg/dL (ref 0.3–1.2)
Total Protein: 7 g/dL (ref 6.5–8.1)

## 2017-07-30 LAB — CBC WITH DIFFERENTIAL/PLATELET
Basophils Absolute: 0 10*3/uL (ref 0.0–0.1)
Basophils Relative: 0 %
EOS PCT: 1 %
Eosinophils Absolute: 0.1 10*3/uL (ref 0.0–0.7)
HEMATOCRIT: 37.3 % (ref 36.0–46.0)
Hemoglobin: 12.1 g/dL (ref 12.0–15.0)
LYMPHS ABS: 1.9 10*3/uL (ref 0.7–4.0)
LYMPHS PCT: 25 %
MCH: 27.5 pg (ref 26.0–34.0)
MCHC: 32.4 g/dL (ref 30.0–36.0)
MCV: 84.8 fL (ref 78.0–100.0)
MONO ABS: 0.5 10*3/uL (ref 0.1–1.0)
MONOS PCT: 7 %
Neutro Abs: 5 10*3/uL (ref 1.7–7.7)
Neutrophils Relative %: 67 %
PLATELETS: 236 10*3/uL (ref 150–400)
RBC: 4.4 MIL/uL (ref 3.87–5.11)
RDW: 13.9 % (ref 11.5–15.5)
WBC: 7.5 10*3/uL (ref 4.0–10.5)

## 2017-07-30 LAB — FERRITIN: FERRITIN: 62 ng/mL (ref 11–307)

## 2017-07-30 LAB — VITAMIN B12: VITAMIN B 12: 163 pg/mL — AB (ref 180–914)

## 2017-07-31 ENCOUNTER — Other Ambulatory Visit (HOSPITAL_COMMUNITY): Payer: Self-pay | Admitting: Oncology

## 2017-07-31 DIAGNOSIS — E538 Deficiency of other specified B group vitamins: Secondary | ICD-10-CM

## 2017-07-31 DIAGNOSIS — D500 Iron deficiency anemia secondary to blood loss (chronic): Secondary | ICD-10-CM

## 2017-08-01 ENCOUNTER — Encounter: Payer: Self-pay | Admitting: Nurse Practitioner

## 2017-08-04 NOTE — Progress Notes (Signed)
Called. Many rings and no answer. Mailing a normal result letter.

## 2017-08-06 ENCOUNTER — Other Ambulatory Visit (HOSPITAL_COMMUNITY): Payer: Self-pay

## 2017-08-06 ENCOUNTER — Ambulatory Visit (HOSPITAL_COMMUNITY): Payer: Self-pay

## 2017-08-06 ENCOUNTER — Inpatient Hospital Stay (HOSPITAL_BASED_OUTPATIENT_CLINIC_OR_DEPARTMENT_OTHER): Payer: Medicare Other | Admitting: Oncology

## 2017-08-06 ENCOUNTER — Encounter (HOSPITAL_COMMUNITY): Payer: Self-pay | Admitting: Oncology

## 2017-08-06 ENCOUNTER — Other Ambulatory Visit: Payer: Self-pay

## 2017-08-06 ENCOUNTER — Ambulatory Visit (HOSPITAL_COMMUNITY): Payer: Self-pay | Admitting: Adult Health

## 2017-08-06 VITALS — BP 129/67 | HR 68 | Temp 97.9°F | Resp 16 | Ht 65.0 in | Wt 210.0 lb

## 2017-08-06 DIAGNOSIS — D500 Iron deficiency anemia secondary to blood loss (chronic): Secondary | ICD-10-CM | POA: Diagnosis not present

## 2017-08-06 DIAGNOSIS — K909 Intestinal malabsorption, unspecified: Secondary | ICD-10-CM

## 2017-08-06 DIAGNOSIS — K219 Gastro-esophageal reflux disease without esophagitis: Secondary | ICD-10-CM | POA: Diagnosis not present

## 2017-08-06 DIAGNOSIS — Z79899 Other long term (current) drug therapy: Secondary | ICD-10-CM

## 2017-08-06 DIAGNOSIS — K5090 Crohn's disease, unspecified, without complications: Secondary | ICD-10-CM | POA: Diagnosis not present

## 2017-08-06 DIAGNOSIS — M797 Fibromyalgia: Secondary | ICD-10-CM | POA: Diagnosis not present

## 2017-08-06 DIAGNOSIS — E538 Deficiency of other specified B group vitamins: Secondary | ICD-10-CM

## 2017-08-06 NOTE — Progress Notes (Signed)
Sara Drown, MD [place] [address]  B12 deficiency  Iron deficiency anemia due to chronic blood loss  CURRENT THERAPY: Last IV Feraheme infusion was on 07/26/16  INTERVAL HISTORY: Patient returns for follow-up of mixed anemia from malabsorption  and chronic blood loss in the setting of Crohn's disease.  She is followed for her GI and Crohn's disease by Walden Field, NP.  She recently visited the GI clinic on 06/25/2017 where she continued to remain free from chron's exacerbation.  There was concern of loss of insurance and an increase in premium for medication for her Crohn's disease called Pentasa.  She was given a 23-month supply in hopes to get her through the new year.  She recently started Medicare and so far is pleased with her coverage.  Today patient denies any abdominal pain or Crohn's-like symptoms.  She does complain of "severe fatigue".  "I think my iron is low.  I am starting to feel very tired from day today."  She denies any GI bleeding or melena.  She is unable to tolerate oral iron.  Her last IV iron was approximately 1 year ago.  Patient does complain of mild aches and pains due to her fibromyalgia.  Review of Systems  Constitutional: Negative.  Negative for chills, fever and weight loss.  HENT: Negative.   Respiratory: Negative.  Negative for cough.   Cardiovascular: Negative.  Negative for chest pain.  Gastrointestinal: Negative.  Negative for blood in stool and melena.  Genitourinary: Negative.  Negative for hematuria.  Musculoskeletal: Positive for joint pain and myalgias.       Fibromyalgia  Skin: Negative.   Neurological: Negative.  Negative for weakness.  Endo/Heme/Allergies: Negative.   Psychiatric/Behavioral: Negative.     Past Medical History:  Diagnosis Date  . Arthritis   . B12 deficiency 07/09/2015  . Crohn disease ([place]) JULY 2011 FEDA   PERSISTENT TI ULCERS seen on CE, NO NSAID USE  . Endometriosis    HISTORY   LEADING TO HYSTERECTOMY  . Fibromyalgia   . Fibromyalgia 12/09/2013  . Gastric ulcer   . GERD (gastroesophageal reflux disease)   . Headache   . Helicobacter pylori gastritis    HISTORY  . History of hiatal hernia   . Hives   . Iron (Fe) deficiency anemia 2009   JAN 2012 NL HB & FERRITIN  . Obesity   . Osteoporosis 11/20/2014  . Plantar fasciitis     Past Surgical History:  Procedure Laterality Date  . ABDOMINAL HYSTERECTOMY    . CATARACT EXTRACTION W/PHACO Right 12/05/2015   Procedure: CATARACT EXTRACTION PHACO AND INTRAOCULAR LENS PLACEMENT (IOC);  Surgeon: Birder Robson, MD;  Location: [facility] ORS;  Service: Ophthalmology;  Laterality: Right;  [place] 00:59.9 AP% 21.9 CDE 13.10 fluid pack lot # [id number]  . CATARACT EXTRACTION W/PHACO Left 06/24/2016   Procedure: CATARACT EXTRACTION PHACO AND INTRAOCULAR LENS PLACEMENT LEFT EYE; CDE:  8.33;  Surgeon: Tonny Branch, MD;  Location: [facility];  Service: Ophthalmology;  Laterality: Left;  . COLONOSCOPY    . ESOPHAGOGASTRODUODENOSCOPY  10/14/2007   Esophagus showed 2 cm pink tongue seen extending from the GE junction.  Biopsies obtained via cold forceps.  Otherwise the  esophagus was without erosions, mass, ulceration, or stricture / . Large hiatal hernia pouch with linear erosions, and two 3-6 mmulcers seen in the pouch.  Biopsies obtained via cold forceps to  evaluate for H. pylori gastritis or malignancy  .  Ileocolonoscopy  10/14/2007   Multiple 1-3 mm terminal ileum ulcers biopsied  The ulcers seemed to only involve the last 5 cm of her terminal ileum, and 10 cm of her ileum was visualized.  Otherwise the colon  was without polyps, masses, diverticula, or AVMs.  She had a normal retroflexed view of the rectum  . OVARIAN CYST REMOVAL    . TOTAL ABDOMINAL HYSTERECTOMY W/ BILATERAL SALPINGOOPHORECTOMY    . UPPER GASTROINTESTINAL ENDOSCOPY  JULY 2011 DIARRHEA/FEDA    Bx-H.PYLORI GASTRITIS, NL DUODENUM    Family History  Problem Relation Age of  Onset  . Diabetes Mother   . Cancer Father   . Diabetes Father   . Diabetes Sister   . Diabetes Brother   . Colon polyps Neg Hx   . Colon cancer Neg Hx   . Stroke Neg Hx     Social History   Socioeconomic History  . Marital status: Married    Spouse name: Alvester Chou   . Number of children: 3  . Years of education: 41  . Highest education level: None  Social Needs  . Financial resource strain: None  . Food insecurity - worry: None  . Food insecurity - inability: None  . Transportation needs - medical: None  . Transportation needs - non-medical: None  Occupational History  . Occupation: Retired  Tobacco Use  . Smoking status: Never Smoker  . Smokeless tobacco: Never Used  Substance and Sexual Activity  . Alcohol use: No  . Drug use: No  . Sexual activity: Yes    Birth control/protection: Surgical  Other Topics Concern  . None  Social History Narrative   Lives w/ husband   Caffeine use: Drinks 1-2 cups coffee per day     PHYSICAL EXAMINATION  ECOG PERFORMANCE STATUS: 0 - Asymptomatic  Vitals:   08/06/17 0930  BP: 129/67  Pulse: 68  Resp: 16  Temp: 97.9 F (36.6 C)  SpO2: 96%    Vitals - 1 value per visit 08/06/2017 67/06/4579  SYSTOLIC 998 338  DIASTOLIC 67 47  Pulse 68 85  Temperature 97.9 98  Respirations 16 16    GENERAL:alert, no distress, comfortable, cooperative, obese, smiling and unaccompanied SKIN: skin color, texture, turgor are normal, no rashes or significant lesions HEAD: Normocephalic, No masses, lesions, tenderness or abnormalities EYES: normal, Conjunctiva are pink and non-injected EARS: External ears normal OROPHARYNX:lips, buccal mucosa, and tongue normal and mucous membranes are moist  NECK: supple, trachea midline LYMPH:  not examined BREAST:not examined LUNGS: clear to auscultation  HEART: regular rate & rhythm ABDOMEN:abdomen soft and obese BACK: Back symmetric, no curvature. EXTREMITIES:less then 2 second capillary refill, no  joint deformities, effusion, or inflammation, no skin discoloration, no cyanosis  NEURO: alert & oriented x 3 with fluent speech, no focal motor/sensory deficits, gait normal   LABORATORY DATA: CBC    Component Value Date/Time   WBC 7.5 07/30/2017 1411   RBC 4.40 07/30/2017 1411   HGB 12.1 07/30/2017 1411   HCT 37.3 07/30/2017 1411   PLT 236 07/30/2017 1411   MCV 84.8 07/30/2017 1411   MCH 27.5 07/30/2017 1411   MCHC 32.4 07/30/2017 1411   RDW 13.9 07/30/2017 1411   LYMPHSABS 1.9 07/30/2017 1411   MONOABS 0.5 07/30/2017 1411   EOSABS 0.1 07/30/2017 1411   BASOSABS 0.0 07/30/2017 1411      Chemistry      Component Value Date/Time   NA 140 07/30/2017 1411   NA 141 09/06/2015 1303  K 3.4 (L) 07/30/2017 1411   CL 105 07/30/2017 1411   CO2 21 (L) 07/30/2017 1411   BUN 15 07/30/2017 1411   BUN 15 09/06/2015 1303   CREATININE 0.56 07/30/2017 1411   CREATININE 0.58 12/08/2014 1418      Component Value Date/Time   CALCIUM 8.9 07/30/2017 1411   ALKPHOS 100 07/30/2017 1410   AST 20 07/30/2017 1410   ALT 20 07/30/2017 1410   BILITOT 0.3 07/30/2017 1410     Lab Results  Component Value Date   IRON 27 (L) 07/30/2017   TIBC 287 07/30/2017   FERRITIN 62 07/30/2017   Lab Results  Component Value Date   VITAMINB12 163 (L) 07/30/2017     PENDING LABS:   RADIOGRAPHIC STUDIES:  No results found.   PATHOLOGY:    ASSESSMENT AND PLAN:  Iron deficiency anemia Mixed anemia with contributions from chronic blood loss and malabsorption, requiring intermittent IV iron.  Labs today: CBC diff, iron/TIBC, ferritin.  I personally reviewed and went over laboratory results with this patient.  All questions were answered.  The results are noted within this dictation.  Patient was noted to be iron deficient and B12 deficient indicated by labs today.  Patient is refusing B12 injections stating "they give me a headache".  She will replace with oral supplements.  Labs in 6 and 12  months: CBC diff, iron/TIBC, ferritin: She will call in interim if needed based on her symptoms.  Patient will receive 1 dose of IV  feraheme tomorrow. Please set patient up for 1 dose of IV Feraheme 510 mg.  Calculated iron deficit is ~ 388.39 mg with a target HGB of 14 g/dL.  Supportive therapy plan is built.  Her insurance has recently switched to Medicare.  So far, she has noted no issues with affordability of medications.  Return in 3 months for follow-up/labs.    ORDERS PLACED FOR THIS ENCOUNTER: No orders of the defined types were placed in this encounter.   MEDICATIONS PRESCRIBED THIS ENCOUNTER: No orders of the defined types were placed in this encounter.   This note is electronically signed by: Jacquelin Hawking, NP 08/06/2017 12:31 PM

## 2017-08-06 NOTE — Assessment & Plan Note (Addendum)
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  Labs today: CBC diff, iron/TIBC, ferritin.  I personally reviewed and went over laboratory results with this patient.  All questions were answered.  The results are noted within this dictation.  Patient was noted to be iron deficient and B12 deficient indicated by labs today.  Patient is refusing B12 injections stating "they give me a headache".  She will replace with oral supplements.  Labs in 6 and 12 months: CBC diff, iron/TIBC, ferritin: She will call in interim if needed based on her symptoms.  Patient will receive 1 dose of IV  feraheme tomorrow. Please set patient up for 1 dose of IV Feraheme 510 mg.  Calculated iron deficit is ~ 388.39 mg with a target HGB of 14 g/dL.  Supportive therapy plan is built.  Her insurance has recently switched to Medicare.  So far, she has noted no issues with affordability of medications.  Return in 3 months for follow-up/labs.

## 2017-08-07 ENCOUNTER — Encounter (HOSPITAL_COMMUNITY): Payer: Self-pay

## 2017-08-07 ENCOUNTER — Inpatient Hospital Stay (HOSPITAL_COMMUNITY): Payer: Medicare Other

## 2017-08-07 VITALS — BP 145/63 | HR 78 | Temp 98.4°F | Resp 18

## 2017-08-07 DIAGNOSIS — E538 Deficiency of other specified B group vitamins: Secondary | ICD-10-CM | POA: Diagnosis not present

## 2017-08-07 DIAGNOSIS — K909 Intestinal malabsorption, unspecified: Secondary | ICD-10-CM | POA: Diagnosis not present

## 2017-08-07 DIAGNOSIS — D500 Iron deficiency anemia secondary to blood loss (chronic): Secondary | ICD-10-CM | POA: Diagnosis not present

## 2017-08-07 DIAGNOSIS — K5090 Crohn's disease, unspecified, without complications: Secondary | ICD-10-CM | POA: Diagnosis not present

## 2017-08-07 DIAGNOSIS — K219 Gastro-esophageal reflux disease without esophagitis: Secondary | ICD-10-CM | POA: Diagnosis not present

## 2017-08-07 DIAGNOSIS — M797 Fibromyalgia: Secondary | ICD-10-CM | POA: Diagnosis not present

## 2017-08-07 MED ORDER — SODIUM CHLORIDE 0.9% FLUSH
10.0000 mL | INTRAVENOUS | Status: DC | PRN
  Administered 2017-08-07: 10 mL
  Filled 2017-08-07: qty 10

## 2017-08-07 MED ORDER — FERUMOXYTOL INJECTION 510 MG/17 ML
510.0000 mg | Freq: Once | INTRAVENOUS | Status: AC
  Administered 2017-08-07: 510 mg via INTRAVENOUS
  Filled 2017-08-07: qty 17

## 2017-08-07 MED ORDER — SODIUM CHLORIDE 0.9 % IV SOLN
Freq: Once | INTRAVENOUS | Status: AC
  Administered 2017-08-07: 14:00:00 via INTRAVENOUS

## 2017-08-07 NOTE — Patient Instructions (Signed)
[facility]  Discharge Instructions:  You received an iron infusion today.  _______________________________________________________________  Thank you for choosing [facility] to provide your oncology and hematology care.  To afford each patient quality time with our providers, please arrive at least 15 minutes before your scheduled appointment.  You need to re-schedule your appointment if you arrive 10 or more minutes late.  We strive to give you quality time with our providers, and arriving late affects you and other patients whose appointments are after yours.  Also, if you no show three or more times for appointments you may be dismissed from the clinic.  Again, thank you for choosing  [facility]. Our hope is that these requests will allow you access to exceptional care and in a timely manner. _______________________________________________________________  If you have questions after your visit, please contact our office at [phone] between the hours of 8:30 a.m. and 5:00 p.m. Voicemails left after 4:30 p.m. will not be returned until the following business day. _______________________________________________________________  For prescription refill requests, have your pharmacy contact our office. _______________________________________________________________  Recommendations made by the consultant and any test results will be sent to your referring physician. _______________________________________________________________ 

## 2017-08-07 NOTE — Progress Notes (Signed)
Reviewed side effects of iron and when to notify nurse or call the cancer center for any problems with understanding verbalized.   Peripheral IV site with good blood return.    Patient tolerated iron infusion with no complaints voiced.  Peripheral IV site clean and dry with no bruising or swelling noted at site.  Denied pain at site.  Good blood return noted before and after administration of iron. Band aid applied.  VSS with discharge and left ambulatory with no s/s of distress noted.

## 2017-08-12 ENCOUNTER — Encounter: Payer: Self-pay | Admitting: Gastroenterology

## 2017-08-25 ENCOUNTER — Encounter: Payer: Self-pay | Admitting: Nurse Practitioner

## 2017-08-25 ENCOUNTER — Ambulatory Visit: Payer: Medicare Other | Admitting: Nurse Practitioner

## 2017-08-25 ENCOUNTER — Ambulatory Visit (INDEPENDENT_AMBULATORY_CARE_PROVIDER_SITE_OTHER): Payer: Medicare Other | Admitting: Nurse Practitioner

## 2017-08-25 ENCOUNTER — Encounter: Payer: Self-pay | Admitting: *Deleted

## 2017-08-25 ENCOUNTER — Other Ambulatory Visit: Payer: Self-pay | Admitting: *Deleted

## 2017-08-25 VITALS — BP 139/80 | HR 86 | Temp 97.1°F | Ht 65.0 in | Wt 211.0 lb

## 2017-08-25 DIAGNOSIS — R600 Localized edema: Secondary | ICD-10-CM

## 2017-08-25 DIAGNOSIS — K219 Gastro-esophageal reflux disease without esophagitis: Secondary | ICD-10-CM

## 2017-08-25 DIAGNOSIS — K5000 Crohn's disease of small intestine without complications: Secondary | ICD-10-CM

## 2017-08-25 MED ORDER — NA SULFATE-K SULFATE-MG SULF 17.5-3.13-1.6 GM/177ML PO SOLN: 1.0000 | ORAL | 0 refills | Status: DC

## 2017-08-25 NOTE — Assessment & Plan Note (Signed)
She complains of left lower extremity edema.  States she has a history of phlebitis.  On exam her left lower extremity is significantly larger than her right lower extremity.  No obvious erythema or warmth.  Not overtly painful, she states "it just feels tight, like elastic."  No obvious rashes or breaks in the skin.  At this point I will order a left lower extremity ultrasound to rule out DVT we will forward results on the primary care.

## 2017-08-25 NOTE — Progress Notes (Signed)
Referring Provider: Kathyrn Drown, MD Primary Care Physician:  Kathyrn Drown, MD Primary GI:  Dr. Oneida Alar  Chief Complaint  Patient presents with  . Crohn's Disease    doing ok; out of Pentasa    HPI:   [name] is a 67 y.o. female who presents for follow-up on Crohn's disease.  The patient was last seen in our office 06/25/2017 for the same.  She was wanting to discuss her medications at that time.  History of anemia and fibromyalgia.  No adverse effects from Pentasa.  At her last visit she was doing well, rare abdominal pain.  Energy intermittent due to chronic anemia and fibromyalgia.  Having difficulties affording Pentasa now that she is on Medicare and has cut back on her medicine to once a day.  Crohn's diagnosis made by capsule endoscopy which found distal/terminal ileum ulcerations consistent with Crohn's disease.  She was provided samples of Pentasa to help.  Recommended discussing case with Dr. Oneida Alar and follow-up in 2 months.  Colonoscopy due in 2019 at follow-up.    Her last CBC was completed 07/30/2017 which was normal with a hemoglobin of 12.1.  Basal metabolic panel and hepatic function panel on the same day were also essentially normal other than a mildly depressed potassium of 3.4.  Iron was low at 27, saturation low at 9%, ferritin has declined to 62, although technically still normal.  Recommended another dose of IV Fereheme.  Her vitamin B12 level was low at 163, but the patient declined B12 injections.  After discussion with Dr. Oneida Alar, sulfasalazine is not an option. The only Chron's medication that releases in the small bowel (where he disease is) is Pentasa.  Today she states she has been out of Pentasa for several weeks. Was told by her insurance that it's about $600/month (per her recollection). She had recently come down to once daily due to cost. Denies abdominal pain, N/V, hematochezia, melena, fever, chills, unintentional weight loss. Her energy is poor in  the setting of chronic anemia with IV Iron administration per hematology. GERD symptoms well controlled on PPI. Denies chest pain, dyspnea, dizziness, lightheadedness, syncope, near syncope. Denies any other upper or lower GI symptoms.  She has noted left LE edema (one-sided). Has a history of phlebitis. No erythema, warmth, or calf pain.  Colonoscopy last completed 10/14/2007 which found TI ulcerations; and due for repeat 09/2017.  At the end of her visit she states "It's not that I can't afford the Pentasa, I just think it's a lot of money and I didn't want to have to pay that much."  Past Medical History:  Diagnosis Date  . Arthritis   . B12 deficiency 07/09/2015  . Crohn disease ([place]) JULY 2011 FEDA   PERSISTENT TI ULCERS seen on CE, NO NSAID USE  . Endometriosis    HISTORY  LEADING TO HYSTERECTOMY  . Fibromyalgia   . Fibromyalgia 12/09/2013  . Gastric ulcer   . GERD (gastroesophageal reflux disease)   . Headache   . Helicobacter pylori gastritis    HISTORY  . History of hiatal hernia   . Hives   . Iron (Fe) deficiency anemia 2009   JAN 2012 NL HB & FERRITIN  . Obesity   . Osteoporosis 11/20/2014  . Plantar fasciitis     Past Surgical History:  Procedure Laterality Date  . ABDOMINAL HYSTERECTOMY    . CATARACT EXTRACTION W/PHACO Right 12/05/2015   Procedure: CATARACT EXTRACTION PHACO AND INTRAOCULAR LENS PLACEMENT (IOC);  Surgeon: Birder Robson, MD;  Location: [facility] ORS;  Service: Ophthalmology;  Laterality: Right;  [place] 00:59.9 AP% 21.9 CDE 13.10 fluid pack lot # [id number]  . CATARACT EXTRACTION W/PHACO Left 06/24/2016   Procedure: CATARACT EXTRACTION PHACO AND INTRAOCULAR LENS PLACEMENT LEFT EYE; CDE:  8.33;  Surgeon: Tonny Branch, MD;  Location: [facility];  Service: Ophthalmology;  Laterality: Left;  . COLONOSCOPY    . ESOPHAGOGASTRODUODENOSCOPY  10/14/2007   Esophagus showed 2 cm pink tongue seen extending from the GE junction.  Biopsies obtained via cold forceps.  Otherwise  the  esophagus was without erosions, mass, ulceration, or stricture / . Large hiatal hernia pouch with linear erosions, and two 3-6 mmulcers seen in the pouch.  Biopsies obtained via cold forceps to  evaluate for H. pylori gastritis or malignancy  . Ileocolonoscopy  10/14/2007   Multiple 1-3 mm terminal ileum ulcers biopsied  The ulcers seemed to only involve the last 5 cm of her terminal ileum, and 10 cm of her ileum was visualized.  Otherwise the colon  was without polyps, masses, diverticula, or AVMs.  She had a normal retroflexed view of the rectum  . OVARIAN CYST REMOVAL    . TOTAL ABDOMINAL HYSTERECTOMY W/ BILATERAL SALPINGOOPHORECTOMY    . UPPER GASTROINTESTINAL ENDOSCOPY  JULY 2011 DIARRHEA/FEDA    Bx-H.PYLORI GASTRITIS, NL DUODENUM    Current Outpatient Medications  Medication Sig Dispense Refill  . acetaminophen (TYLENOL) 325 MG tablet Take 650 mg by mouth as needed for moderate pain or headache.     Marland Kitchen omeprazole (PRILOSEC) 20 MG capsule TAKE 1 CAPSULE (20 MG TOTAL) BY MOUTH DAILY. 31 capsule 11  . mesalamine (PENTASA) 500 MG CR capsule Take 2 capsules (1,000 mg total) by mouth 4 (four) times daily. (Patient not taking: Reported on 08/25/2017) 720 capsule 1   No current facility-administered medications for this visit.     Allergies as of 08/25/2017 - Review Complete 08/25/2017  Allergen Reaction Noted  . Cefzil [cefprozil] Rash 05/06/2017  . Celebrex [celecoxib] Hives and Itching 01/07/2011  . Sulfonamide derivatives Itching     Family History  Problem Relation Age of Onset  . Diabetes Mother   . Cancer Father   . Diabetes Father   . Diabetes Sister   . Diabetes Brother   . Colon polyps Neg Hx   . Colon cancer Neg Hx   . Stroke Neg Hx     Social History   Socioeconomic History  . Marital status: Married    Spouse name: Alvester Chou   . Number of children: 3  . Years of education: 40  . Highest education level: None  Social Needs  . Financial resource strain: None  .  Food insecurity - worry: None  . Food insecurity - inability: None  . Transportation needs - medical: None  . Transportation needs - non-medical: None  Occupational History  . Occupation: Retired  Tobacco Use  . Smoking status: Never Smoker  . Smokeless tobacco: Never Used  Substance and Sexual Activity  . Alcohol use: No  . Drug use: No  . Sexual activity: Yes    Birth control/protection: Surgical  Other Topics Concern  . None  Social History Narrative   Lives w/ husband   Caffeine use: Drinks 1-2 cups coffee per day    Review of Systems: Complete ROS negative except as per HPI.   Physical Exam: BP 139/80   Pulse 86   Temp (!) 97.1 F (36.2 C) (Oral)   Ht 5' 5"  (  1.651 m)   Wt 211 lb (95.7 kg)   BMI 35.11 kg/m  General:   Alert and oriented. Pleasant and cooperative. Well-nourished and well-developed.  Eyes:  Without icterus, sclera clear and conjunctiva pink.  Ears:  Normal auditory acuity. Cardiovascular:  S1, S2 present without murmurs appreciated. Extremities without clubbing or edema. Respiratory:  Clear to auscultation bilaterally. No wheezes, rales, or rhonchi. No distress.  Gastrointestinal:  +BS, rounded but soft, non-tender and non-distended. No HSM noted. No guarding or rebound. No masses appreciated.  Rectal:  Deferred  Musculoskalatal:  Symmetrical without gross deformities. Skin:  Intact without significant lesions or rashes. LLE significantly larger than RLE. Neurologic:  Alert and oriented x4;  grossly normal neurologically. Psych:  Alert and cooperative. Normal mood and affect. Heme/Lymph/Immune: No excessive bruising noted.    08/25/2017 2:50 PM   Disclaimer: This note was dictated with voice recognition software. Similar sounding words can inadvertently be transcribed and may not be corrected upon review.

## 2017-08-25 NOTE — Patient Instructions (Addendum)
1. Have your labs drawn when you are able to. 2. We will call your insurance company and try to get to the bottom of the cost issue. 3. We will help schedule the ultrasound of your left leg. 4. We will schedule your colonoscopy for you. 5. Return for follow-up in 2 months to see Dr. Oneida Alar. 6. Call us if you have any questions or concerns.

## 2017-08-25 NOTE — Assessment & Plan Note (Signed)
Well-controlled on current PPI therapy.  Continue medications.

## 2017-08-25 NOTE — Assessment & Plan Note (Addendum)
History of Crohn's disease and chronic anemia.  Her last colonoscopy was completed in 2009 which showed terminal ileal ulcerations.  Capsule endoscopy in 2011 again demonstrated TI ulcerations.  She has been on Pentasa.  Her dose has been titrated down due to the cost.  She recently started Medicare last year and the cost has increased.  She thinks they quoted her about $600 a month with her insurance.  At the end of her visit she stated that she could afford it but thinks it is too much money to pay.  She is due for colonoscopy at this time.  We will proceed with colonoscopy which will allow further evaluation of her terminal ileum.  I will check a CRP and ESR as well.  Return for follow-up in 2 months and at this time we may need to discuss other options for Crohn's management, pending colonoscopy results.  I will have office staff contact insurance to find out what the cost of Pentasa is now the new year has reset.  Proceed with colonoscopy with Dr. Oneida Alar in the near future. The risks, benefits, and alternatives have been discussed in detail with the patient. They state understanding and desire to proceed.   The patient is not on any anticoagulants, anxiolytics, chronic pain medications, or antidepressants.  Conscious sedation should be adequate for her procedure.

## 2017-08-26 ENCOUNTER — Other Ambulatory Visit: Payer: Self-pay | Admitting: Nurse Practitioner

## 2017-08-26 NOTE — Progress Notes (Signed)
CC'ED TO PCP 

## 2017-08-27 ENCOUNTER — Ambulatory Visit (HOSPITAL_COMMUNITY)
Admission: RE | Admit: 2017-08-27 | Discharge: 2017-08-27 | Disposition: A | Discharge status: Home / Self Care | Payer: Medicare Other | Source: Ambulatory Visit | Attending: Nurse Practitioner | Admitting: Nurse Practitioner

## 2017-08-27 DIAGNOSIS — I82812 Embolism and thrombosis of superficial veins of left lower extremities: Secondary | ICD-10-CM | POA: Diagnosis not present

## 2017-08-27 DIAGNOSIS — R600 Localized edema: Secondary | ICD-10-CM

## 2017-08-28 ENCOUNTER — Telehealth: Payer: Self-pay | Admitting: Family Medicine

## 2017-08-28 ENCOUNTER — Telehealth: Payer: Self-pay

## 2017-08-28 NOTE — Progress Notes (Signed)
Please forward to PCP

## 2017-08-28 NOTE — Progress Notes (Signed)
PT is aware.

## 2017-08-28 NOTE — Progress Notes (Signed)
LMOM for a return call.  

## 2017-08-28 NOTE — Telephone Encounter (Signed)
GI did [place] of leg,they informed the patient of the result and  they recommend pt to do follow up ov with PCP please contact pt to have her set up follow up next week with me or Hoyle Sauer

## 2017-08-29 NOTE — Telephone Encounter (Signed)
Tried calling patient on both mobile and home number.  No answer.  Will try to call back later today.

## 2017-08-29 NOTE — Telephone Encounter (Signed)
LMOM at home number to give a call back.

## 2017-09-01 NOTE — Telephone Encounter (Signed)
LMOM at home number to call back.

## 2017-09-03 ENCOUNTER — Telehealth: Payer: Self-pay

## 2017-09-03 ENCOUNTER — Encounter: Payer: Self-pay | Admitting: Family Medicine

## 2017-09-03 NOTE — Telephone Encounter (Signed)
Opened in error

## 2017-09-03 NOTE — Telephone Encounter (Signed)
I called [facility] and they ran the prescription with the PA. It previously was going to cost $1755.99 for cash price. With PA  It would be $834.00.

## 2017-09-03 NOTE — Telephone Encounter (Signed)
Mailed letter to patient to call and schedule an appointment.

## 2017-09-03 NOTE — Telephone Encounter (Signed)
I called pt's insurance [phone]).  Pentasa has been approved from 07/28/2017-07/28/2018. PA # [id number].

## 2017-09-04 NOTE — Telephone Encounter (Signed)
Pt would like me to mail the PA papers to her to complete and return. Placing in the mail today.

## 2017-09-04 NOTE — Telephone Encounter (Signed)
PT left Vm with the number [phone] to call. I called and LMOM for a return call.

## 2017-09-04 NOTE — Telephone Encounter (Signed)
Left message with husband for pt to call me.  I will see if she wants to come by the office and pick up paperwork to complete for Patient Assistance for the Pentasa, or if she wants me to mail it to her.

## 2017-09-08 ENCOUNTER — Ambulatory Visit (INDEPENDENT_AMBULATORY_CARE_PROVIDER_SITE_OTHER): Payer: Medicare Other | Admitting: Family Medicine

## 2017-09-08 ENCOUNTER — Encounter: Payer: Self-pay | Admitting: Family Medicine

## 2017-09-08 VITALS — BP 118/72 | Temp 98.8°F | Ht 65.0 in | Wt 211.0 lb

## 2017-09-08 DIAGNOSIS — J029 Acute pharyngitis, unspecified: Secondary | ICD-10-CM

## 2017-09-08 DIAGNOSIS — E538 Deficiency of other specified B group vitamins: Secondary | ICD-10-CM | POA: Diagnosis not present

## 2017-09-08 DIAGNOSIS — I8312 Varicose veins of left lower extremity with inflammation: Secondary | ICD-10-CM

## 2017-09-08 LAB — POCT RAPID STREP A (OFFICE): RAPID STREP A SCREEN: NEGATIVE

## 2017-09-08 MED ORDER — CYANOCOBALAMIN 1000 MCG/ML IJ SOLN
1000.0000 ug | Freq: Once | INTRAMUSCULAR | Status: AC
  Administered 2017-09-08: 1000 ug via INTRAMUSCULAR

## 2017-09-08 NOTE — Progress Notes (Signed)
   Subjective:    Patient ID: [name], female    DOB: 27-Jan-1951, 67 y.o.   MRN: [id number]  HPI Patient is here today with complaints of sore throat and headache, On going since Saturday night.Fever and chills,Abd pain,diarrhea. She relates fatigue tiredness she accounts this to her blood counts that she is a there are not doing is good is a should she states that B12 made her have headaches and did not feel good so she stopped taking them She does have a history of Crohn's disease but denies bloody stools or high fevers  Review of Systems  Constitutional: Positive for fatigue and fever.  HENT: Positive for congestion and sore throat.   Respiratory: Negative for cough, shortness of breath and stridor.   Gastrointestinal: Positive for diarrhea. Negative for vomiting.   Results for orders placed or performed in visit on 09/08/17  POCT rapid strep A  Result Value Ref Range   Rapid Strep A Screen Negative Negative  She was seen by gastroenterology they did do ultrasound on her leg because at that time it was swollen and tender but did not have any DVT     Objective:   Physical Exam  Constitutional: She appears well-developed.  HENT:  Head: Normocephalic.  Right Ear: External ear normal.  Left Ear: External ear normal.  Nose: Nose normal.  Mouth/Throat: Oropharynx is clear and moist. No oropharyngeal exudate.  Eyes: Right eye exhibits no discharge. Left eye exhibits no discharge.  Neck: Neck supple. No tracheal deviation present.  Cardiovascular: Normal rate and normal heart sounds.  No murmur heard. Pulmonary/Chest: Effort normal and breath sounds normal. She has no wheezes. She has no rales.  Abdominal: Soft. There is no tenderness. There is no rebound and no guarding.  Lymphadenopathy:    She has no cervical adenopathy.  Skin: Skin is warm and dry.  Nursing note and vitals reviewed.  But currently her leg does not have any calf tenderness she has had repetitive superficial  thrombophlebitis       Assessment & Plan:  Diarrhea-viral should gradually get better could have some underlying issues with her Crohn's  Viral upper respiratory rapid strep negative no need for antibiotics  B12 deficiency I had a long discussion with the patient she agrees to try the B12 again 1 cc IM weekly for the next 4 weeks then once monthly thereafter  Referral for repetitive superficial thrombophlebitis she may benefit from having stripping done to help prevent the repetitive nature of this

## 2017-09-08 NOTE — Telephone Encounter (Signed)
I did discuss this with her we are referring her to vascular and vein for consultation on her reoccurring superficial thrombophlebitis

## 2017-09-08 NOTE — Telephone Encounter (Signed)
Patient is coming in today for a sick visit.  Will you handle the U/S follow up today or should we schedule a follow up upon check out?

## 2017-09-09 ENCOUNTER — Encounter: Payer: Self-pay | Admitting: Family Medicine

## 2017-09-09 LAB — SPECIMEN STATUS REPORT

## 2017-09-09 LAB — STREP A DNA PROBE: STREP GP A DIRECT, DNA PROBE: NEGATIVE

## 2017-09-15 ENCOUNTER — Emergency Department (HOSPITAL_COMMUNITY)
Admission: EM | Admit: 2017-09-15 | Discharge: 2017-09-15 | Disposition: A | Discharge status: Home / Self Care | Payer: Medicare Other | Attending: Emergency Medicine | Admitting: Emergency Medicine

## 2017-09-15 ENCOUNTER — Emergency Department (HOSPITAL_COMMUNITY): Payer: Medicare Other

## 2017-09-15 ENCOUNTER — Other Ambulatory Visit: Payer: Self-pay

## 2017-09-15 ENCOUNTER — Encounter (HOSPITAL_COMMUNITY): Payer: Self-pay | Admitting: Emergency Medicine

## 2017-09-15 DIAGNOSIS — E041 Nontoxic single thyroid nodule: Secondary | ICD-10-CM | POA: Diagnosis not present

## 2017-09-15 DIAGNOSIS — Z79899 Other long term (current) drug therapy: Secondary | ICD-10-CM | POA: Diagnosis not present

## 2017-09-15 DIAGNOSIS — R0600 Dyspnea, unspecified: Secondary | ICD-10-CM

## 2017-09-15 DIAGNOSIS — R0989 Other specified symptoms and signs involving the circulatory and respiratory systems: Secondary | ICD-10-CM | POA: Diagnosis not present

## 2017-09-15 LAB — D-DIMER, QUANTITATIVE: D-Dimer, Quant: 0.48 ug/mL-FEU (ref 0.00–0.50)

## 2017-09-15 LAB — CBC WITH DIFFERENTIAL/PLATELET
Basophils Absolute: 0 10*3/uL (ref 0.0–0.1)
Basophils Relative: 1 %
Eosinophils Absolute: 0 10*3/uL (ref 0.0–0.7)
Eosinophils Relative: 0 %
HCT: 36.3 % (ref 36.0–46.0)
HEMOGLOBIN: 11.6 g/dL — AB (ref 12.0–15.0)
LYMPHS ABS: 2.2 10*3/uL (ref 0.7–4.0)
LYMPHS PCT: 27 %
MCH: 28.2 pg (ref 26.0–34.0)
MCHC: 32 g/dL (ref 30.0–36.0)
MCV: 88.1 fL (ref 78.0–100.0)
Monocytes Absolute: 0.5 10*3/uL (ref 0.1–1.0)
Monocytes Relative: 7 %
NEUTROS PCT: 65 %
Neutro Abs: 5.3 10*3/uL (ref 1.7–7.7)
Platelets: 248 10*3/uL (ref 150–400)
RBC: 4.12 MIL/uL (ref 3.87–5.11)
RDW: 14.2 % (ref 11.5–15.5)
WBC: 8 10*3/uL (ref 4.0–10.5)

## 2017-09-15 LAB — BASIC METABOLIC PANEL
ANION GAP: 10 (ref 5–15)
BUN: 17 mg/dL (ref 6–20)
CHLORIDE: 105 mmol/L (ref 101–111)
CO2: 24 mmol/L (ref 22–32)
Calcium: 8.9 mg/dL (ref 8.9–10.3)
Creatinine, Ser: 0.63 mg/dL (ref 0.44–1.00)
GFR calc Af Amer: >60 mL/min (ref >60)
GFR calc non Af Amer: >60 mL/min (ref >60)
GLUCOSE: 110 mg/dL — AB (ref 65–99)
POTASSIUM: 3.7 mmol/L (ref 3.5–5.1)
SODIUM: 139 mmol/L (ref 135–145)

## 2017-09-15 LAB — TSH: TSH: 0.031 u[IU]/mL — AB (ref 0.350–4.500)

## 2017-09-15 LAB — TROPONIN I

## 2017-09-15 MED ORDER — IOPAMIDOL (ISOVUE-370) INJECTION 76%
100.0000 mL | Freq: Once | INTRAVENOUS | Status: AC | PRN
  Administered 2017-09-15: 100 mL via INTRAVENOUS

## 2017-09-15 MED ORDER — ALBUTEROL SULFATE (2.5 MG/3ML) 0.083% IN NEBU
5.0000 mg | INHALATION_SOLUTION | Freq: Once | RESPIRATORY_TRACT | Status: DC
  Filled 2017-09-15: qty 6

## 2017-09-15 NOTE — ED Provider Notes (Signed)
Dyspnea since last night [facility] Provider Note   CSN: [id number] Arrival date & time: 09/15/17  1502     History   Chief Complaint Chief Complaint  Patient presents with  . Shortness of Breath    HPI [name] is a 67 y.o. female.  Dyspnea since last night.  No anterior chest pain, fever, sweats, chills.  She did report a viral illness last week, but this has improved.  No prolonged travel, recent surgery, cancer.  She has Crohn's disease and also takes IV iron replacement and B12 injections.  No prior history of cardiac disease.  A Doppler study in 07/31/17 showed chronic superficial thrombophlebitis in the saphenofemoral junction and the saphenous vein.  No history of cardiac disease or thyroid issues.      Past Medical History:  Diagnosis Date  . Arthritis   . B12 deficiency 07/09/2015  . Crohn disease ([place]) JULY 2011 FEDA   PERSISTENT TI ULCERS seen on CE, NO NSAID USE  . Endometriosis    HISTORY  LEADING TO HYSTERECTOMY  . Fibromyalgia   . Fibromyalgia 12/09/2013  . Gastric ulcer   . GERD (gastroesophageal reflux disease)   . Headache   . Helicobacter pylori gastritis    HISTORY  . History of hiatal hernia   . Hives   . Iron (Fe) deficiency anemia 2009   JAN 2012 NL HB & FERRITIN  . Obesity   . Osteoporosis 11/20/2014  . Plantar fasciitis     Patient Active Problem List   Diagnosis Date Noted  . Edema of left lower extremity 08/25/2017  . [place] headache 09/06/2015  . Vision changes 09/06/2015  . Dizziness 09/06/2015  . New onset of headaches after age 37 09/06/2015  . B12 deficiency 07/09/2015  . Acute bilateral lower abdominal pain 12/08/2014  . Osteoporosis 11/20/2014  . Chronic back pain 11/10/2014  . GERD (gastroesophageal reflux disease) 06/16/2014  . Impaired fasting glucose 03/29/2014  . Fibromyalgia 12/09/2013  . Crohn's ileitis ([place]) 01/17/2011  . Iron deficiency anemia 04/10/2010    Past Surgical History:    Procedure Laterality Date  . ABDOMINAL HYSTERECTOMY    . CATARACT EXTRACTION W/PHACO Right 12/05/2015   Procedure: CATARACT EXTRACTION PHACO AND INTRAOCULAR LENS PLACEMENT (IOC);  Surgeon: Birder Robson, MD;  Location: [facility] ORS;  Service: Ophthalmology;  Laterality: Right;  [place] 00:59.9 AP% 21.9 CDE 13.10 fluid pack lot # [id number]  . CATARACT EXTRACTION W/PHACO Left 06/24/2016   Procedure: CATARACT EXTRACTION PHACO AND INTRAOCULAR LENS PLACEMENT LEFT EYE; CDE:  8.33;  Surgeon: Tonny Branch, MD;  Location: [facility];  Service: Ophthalmology;  Laterality: Left;  . COLONOSCOPY    . ESOPHAGOGASTRODUODENOSCOPY  10/14/2007   Esophagus showed 2 cm pink tongue seen extending from the GE junction.  Biopsies obtained via cold forceps.  Otherwise the  esophagus was without erosions, mass, ulceration, or stricture / . Large hiatal hernia pouch with linear erosions, and two 3-6 mmulcers seen in the pouch.  Biopsies obtained via cold forceps to  evaluate for H. pylori gastritis or malignancy  . Ileocolonoscopy  10/14/2007   Multiple 1-3 mm terminal ileum ulcers biopsied  The ulcers seemed to only involve the last 5 cm of her terminal ileum, and 10 cm of her ileum was visualized.  Otherwise the colon  was without polyps, masses, diverticula, or AVMs.  She had a normal retroflexed view of the rectum  . OVARIAN CYST REMOVAL    . TOTAL ABDOMINAL HYSTERECTOMY W/ BILATERAL SALPINGOOPHORECTOMY    .  UPPER GASTROINTESTINAL ENDOSCOPY  JULY 2011 DIARRHEA/FEDA    Bx-H.PYLORI GASTRITIS, NL DUODENUM    OB History    No data available       Home Medications    Prior to Admission medications   Medication Sig Start Date End Date Taking? Authorizing Provider  acetaminophen (TYLENOL) 325 MG tablet Take 650 mg by mouth as needed for moderate pain or headache.    Yes [provider]  omeprazole (PRILOSEC) 20 MG capsule TAKE 1 CAPSULE (20 MG TOTAL) BY MOUTH DAILY. 05/27/16  Yes Carlis Stable, NP  mesalamine  (PENTASA) 500 MG CR capsule Take 2 capsules (1,000 mg total) by mouth 4 (four) times daily. Patient not taking: Reported on 09/15/2017 06/05/16   Carlis Stable, NP  Na Sulfate-K Sulfate-Mg Sulf 17.5-3.13-1.6 GM/177ML SOLN Take 1 kit by mouth as directed. 08/25/17   Danie Binder, MD  PENTASA 500 MG CR capsule TAKE 2 CAPSULES BY MOUTH 4 TIMES A DAY 08/28/17   Mahala Menghini, PA-C    Family History Family History  Problem Relation Age of Onset  . Diabetes Mother   . Cancer Father   . Diabetes Father   . Diabetes Sister   . Diabetes Brother   . Colon polyps Neg Hx   . Colon cancer Neg Hx   . Stroke Neg Hx     Social History Social History   Tobacco Use  . Smoking status: Never Smoker  . Smokeless tobacco: Never Used  Substance Use Topics  . Alcohol use: No  . Drug use: No     Allergies   Cefzil [cefprozil]; Celebrex [celecoxib]; and Sulfonamide derivatives   Review of Systems Review of Systems  All other systems reviewed and are negative.    Physical Exam Updated Vital Signs BP 140/74 (BP Location: Right Arm)   Pulse 97   Temp 98 F (36.7 C) (Oral)   Resp 16   Ht '5\' 5"'$  (1.651 m)   Wt 95.3 kg (210 lb)   SpO2 97%   BMI 34.95 kg/m   Physical Exam  Constitutional: She is oriented to person, place, and time. She appears well-developed and well-nourished.  HENT:  Head: Normocephalic and atraumatic.  Eyes: Conjunctivae are normal.  Neck: Neck supple.  No palpable thyroid mass.  Cardiovascular: Normal rate and regular rhythm.  Pulmonary/Chest: Effort normal and breath sounds normal.  Abdominal: Soft. Bowel sounds are normal.  Musculoskeletal: Normal range of motion.  Neurological: She is alert and oriented to person, place, and time.  Skin: Skin is warm and dry.  Psychiatric: She has a normal mood and affect. Her behavior is normal.  Nursing note and vitals reviewed.    ED Treatments / Results  Labs (all labs ordered are listed, but only abnormal results  are displayed) Labs Reviewed  CBC WITH DIFFERENTIAL/PLATELET - Abnormal; Notable for the following components:      Result Value   Hemoglobin 11.6 (*)    All other components within normal limits  BASIC METABOLIC PANEL - Abnormal; Notable for the following components:   Glucose, Bld 110 (*)    All other components within normal limits  TROPONIN I  D-DIMER, QUANTITATIVE (NOT AT [facility])  TSH    EKG  EKG Interpretation  Date/Time:  Monday September 15 2017 15:17:57 EST Ventricular Rate:  96 PR Interval:  162 QRS Duration: 74 QT Interval:  364 QTC Calculation: 459 R Axis:   -12 Text Interpretation:  Normal sinus rhythm Normal ECG Confirmed by  Nat Christen ([id number]) on 09/15/2017 5:39:25 PM       Radiology Dg Chest 2 View  Result Date: 09/15/2017 CLINICAL DATA:  Chest heaviness and congestion. Recent viral illness. EXAM: CHEST  2 VIEW COMPARISON:  Chest radiograph August 23, 2005 FINDINGS: Cardiomediastinal silhouette is normal. No pleural effusions or focal consolidations. Trachea projects midline and there is no pneumothorax. Soft tissue planes and included osseous structures are non-suspicious. Large partially air-filled hiatal hernia. Mild degenerative change of the thoracic spine. IMPRESSION: No acute cardiopulmonary process. Large hiatal hernia. Electronically Signed   By: Elon Alas M.D.   On: 09/15/2017 15:59   Ct Angio Chest Pe W And/or Wo Contrast  Result Date: 09/15/2017 CLINICAL DATA:  Dyspnea since yesterday. EXAM: CT ANGIOGRAPHY CHEST WITH CONTRAST TECHNIQUE: Multidetector CT imaging of the chest was performed using the standard protocol during bolus administration of intravenous contrast. Multiplanar CT image reconstructions and MIPs were obtained to evaluate the vascular anatomy. CONTRAST:  196m ISOVUE-370 IOPAMIDOL (ISOVUE-370) INJECTION 76% COMPARISON:  Chest radiograph from earlier today. FINDINGS: Cardiovascular: The study is high quality for the evaluation of  pulmonary embolism. There are no filling defects in the central, lobar, segmental or subsegmental pulmonary artery branches to suggest acute pulmonary embolism. Atherosclerotic nonaneurysmal thoracic aorta. Normal caliber pulmonary arteries. Normal heart size. No significant pericardial fluid/thickening. Left anterior descending coronary atherosclerosis. Mediastinum/Nodes: Apparent 2.9 cm heterogeneous thyroid isthmus nodule. Unremarkable esophagus. No pathologically enlarged axillary, mediastinal or hilar lymph nodes. Lungs/Pleura: No pneumothorax. No pleural effusion. Prominent compressive atelectasis in the medial left lower lobe by the hiatal hernia. Mild compressive atelectasis in the medial right lower lobe by the hiatal hernia. No acute consolidative airspace disease, lung masses or significant pulmonary nodules. Upper abdomen: Probable diffuse hepatic steatosis. Large hiatal hernia. Musculoskeletal: No aggressive appearing focal osseous lesions. Moderate thoracic spondylosis. Review of the MIP images confirms the above findings. IMPRESSION: 1. No pulmonary embolism. 2. Large hiatal hernia with associated bibasilar atelectasis. 3. One vessel coronary atherosclerosis. 4. Apparent 2.9 cm thyroid isthmus nodule. Recommend correlation with thyroid ultrasound on a short term outpatient basis. This follows ACR consensus guidelines: Managing Incidental Thyroid Nodules Detected on Imaging: White Paper of the ACR Incidental Thyroid Findings Committee. J Am Coll Radiol 2015; 12:143-150. Aortic Atherosclerosis (ICD10-I70.0). Electronically Signed   By: JIlona SorrelM.D.   On: 09/15/2017 19:24    Procedures Procedures (including critical care time)  Medications Ordered in ED Medications  albuterol (PROVENTIL) (2.5 MG/3ML) 0.083% nebulizer solution 5 mg (5 mg Nebulization Not Given 09/15/17 1633)  iopamidol (ISOVUE-370) 76 % injection 100 mL (100 mLs Intravenous Contrast Given 09/15/17 1833)     Initial  Impression / Assessment and Plan / ED Course  I have reviewed the triage vital signs and the nursing notes.  Pertinent labs & imaging results that were available during my care of the patient were reviewed by me and considered in my medical decision making (see chart for details).     Patient reports dyspnea since this morning.  No chest pain.  EKG, troponin, CT angiogram of chest all negative for cardiac or pulmonary pathology.  2.9 cm thyroid isthmus nodule was noted.  I have ordered an ultrasound per radiology recommendation.  This was all discussed with the patient.  She has primary care follow-up.  TSH pending.  Final Clinical Impressions(s) / ED Diagnoses   Final diagnoses:  Dyspnea, unspecified type  Thyroid nodule    ED Discharge Orders        Ordered  US THYROID     09/15/17 2009       Nat Christen, MD 09/15/17 2015

## 2017-09-15 NOTE — Progress Notes (Signed)
Pt refusing breathing treatment. States she doesn't feel too bad like she can't breathe just a little shortness of breath. She does not want anything that will make her feel jittery. I told her it doesn't always make people feel that way but she states she doesn't want to try it. Pt in no distress, on RA and lungs clear. Pt states just occasional SOB like a slight gasp.

## 2017-09-15 NOTE — Discharge Instructions (Signed)
CT scan of your chest showed no evidence of a blood clot.  EKG and heart chemical called troponin were normal.  Call [phone] in AM to confirm appointment for thyroid ultrasound.    Follow-up with your primary care doctor.

## 2017-09-15 NOTE — ED Triage Notes (Signed)
Pt states had flu like symptoms last week went to PCP. Pt complaining of sob worse with movement or exertion. Hurts to take a deep breath.

## 2017-09-17 ENCOUNTER — Ambulatory Visit: Payer: Medicare Other

## 2017-09-17 ENCOUNTER — Ambulatory Visit (HOSPITAL_COMMUNITY)
Admission: RE | Admit: 2017-09-17 | Discharge: 2017-09-17 | Disposition: A | Discharge status: Home / Self Care | Payer: Medicare Other | Source: Ambulatory Visit | Attending: Emergency Medicine | Admitting: Emergency Medicine

## 2017-09-17 DIAGNOSIS — E041 Nontoxic single thyroid nodule: Secondary | ICD-10-CM | POA: Insufficient documentation

## 2017-09-17 NOTE — ED Provider Notes (Signed)
Patient presented to the radiology department today for Thyroid US as an extension of their workup for SOB on 2/19. Please see previous provider's note for details of that visit to include history, physical and medical decision making.   I only relayed the results of the study to them and that she needed to follow up with her PCP to set up a FNA of her thyroid nodule.   Also discussed with them reasons to follow up at the emergency department otherwise continue following up with her primary doctor as directed by previous provider.    Merrily Pew, MD 09/17/17 [phone]

## 2017-09-18 ENCOUNTER — Other Ambulatory Visit (HOSPITAL_COMMUNITY): Payer: Self-pay

## 2017-09-18 ENCOUNTER — Ambulatory Visit (INDEPENDENT_AMBULATORY_CARE_PROVIDER_SITE_OTHER): Payer: Medicare Other

## 2017-09-18 DIAGNOSIS — E538 Deficiency of other specified B group vitamins: Secondary | ICD-10-CM

## 2017-09-18 MED ORDER — CYANOCOBALAMIN 1000 MCG/ML IJ SOLN
1000.0000 ug | Freq: Once | INTRAMUSCULAR | Status: AC
  Administered 2017-09-18: 1000 ug via INTRAMUSCULAR

## 2017-09-18 NOTE — Patient Instructions (Signed)
Return in one week for next dose

## 2017-09-22 ENCOUNTER — Other Ambulatory Visit: Payer: Self-pay

## 2017-09-22 DIAGNOSIS — R600 Localized edema: Secondary | ICD-10-CM

## 2017-09-23 ENCOUNTER — Ambulatory Visit (INDEPENDENT_AMBULATORY_CARE_PROVIDER_SITE_OTHER): Payer: Medicare Other | Admitting: Family Medicine

## 2017-09-23 ENCOUNTER — Encounter: Payer: Self-pay | Admitting: Family Medicine

## 2017-09-23 VITALS — BP 106/60 | Ht 65.0 in | Wt 210.0 lb

## 2017-09-23 DIAGNOSIS — E041 Nontoxic single thyroid nodule: Secondary | ICD-10-CM

## 2017-09-23 DIAGNOSIS — E538 Deficiency of other specified B group vitamins: Secondary | ICD-10-CM | POA: Diagnosis not present

## 2017-09-23 MED ORDER — CYANOCOBALAMIN 1000 MCG/ML IJ SOLN
1000.0000 ug | Freq: Once | INTRAMUSCULAR | Status: AC
  Administered 2017-09-23: 1000 ug via INTRAMUSCULAR

## 2017-09-23 NOTE — Progress Notes (Signed)
   Subjective:    Patient ID: [name], female    DOB: 15-Jun-1951, 67 y.o.   MRN: [id number]  HPI Patient is here today to go over her lab results,and thyroid u/s stated she needed a biopsy, also here for a b12 injection. She had an injection here last Thursday for b12. Can she have another one today?  Patient states that she went to the ER because she felt like she can take a good deep breath she had a scan done which showed a potential thyroid nodule they did an ultrasound which confirmed that she did blood work which showed abnormal TSH she denies any difficulty swallowing or breathing currently denies chest pressure tightness or pain denies history or family history of thyroid cancer Review of Systems  Constitutional: Negative for activity change, appetite change and fatigue.  HENT: Negative for congestion.   Respiratory: Negative for cough.   Cardiovascular: Negative for chest pain.  Gastrointestinal: Negative for abdominal pain.  Endocrine: Negative for heat intolerance, polydipsia and polyphagia.  Skin: Negative for color change.  Neurological: Negative for weakness.  Psychiatric/Behavioral: Negative for confusion.       Objective:   Physical Exam  Constitutional: She appears well-developed and well-nourished. No distress.  HENT:  Head: Normocephalic and atraumatic.  Eyes: Right eye exhibits no discharge. Left eye exhibits no discharge.  Neck: No tracheal deviation present.  Cardiovascular: Normal rate, regular rhythm and normal heart sounds.  No murmur heard. Pulmonary/Chest: Effort normal and breath sounds normal. No respiratory distress. She has no wheezes. She has no rales.  Musculoskeletal: She exhibits no edema.  Lymphadenopathy:    She has no cervical adenopathy.  Neurological: She is alert. She exhibits normal muscle tone.  Skin: Skin is warm and dry. No erythema.  Psychiatric: Her behavior is normal.  Vitals reviewed.  It is hard to feel the thyroid enlargement  but patient does have some tenderness in the area of the nodule       Assessment & Plan:  B12 deficiency shot given today-she would do this weekly for another couple weeks then monthly thereafter  Thyroid nodule lab work ordered will work with ENT more than likely this is a hot nodule may well need biopsy and removal  I did speak with Dr.Teoh regarding this patient.  He will need to see this patient more than likely will consideration for partial thyroidectomy-she will go ahead and do lab work we await the results of this our nurses will call the patient to inform her of this we will help set up the appointment

## 2017-09-24 ENCOUNTER — Telehealth: Payer: Self-pay

## 2017-09-24 LAB — T3: T3 TOTAL: 166 ng/dL (ref 71–180)

## 2017-09-24 LAB — TSH: TSH: 0.122 u[IU]/mL — ABNORMAL LOW (ref 0.450–4.500)

## 2017-09-24 LAB — T4, FREE: FREE T4: 1.47 ng/dL (ref 0.82–1.77)

## 2017-09-24 NOTE — Progress Notes (Signed)
I called home # no answer. I called her cell number and left a,message to r/c.

## 2017-09-24 NOTE — Telephone Encounter (Signed)
Pt called office. Requested to cancel upcoming colonoscopy for 10/03/17. She went to her PCP yesterday, has more pressing health issues that she needs to take care of before colonoscopy. Called and informed Endo scheduler to cancel procedure.

## 2017-09-24 NOTE — Progress Notes (Signed)
Pt aware; has appt with ENT on Tuesday

## 2017-09-25 NOTE — Telephone Encounter (Signed)
Noted  

## 2017-09-30 ENCOUNTER — Ambulatory Visit: Payer: Medicare Other

## 2017-09-30 DIAGNOSIS — D440 Neoplasm of uncertain behavior of thyroid gland: Secondary | ICD-10-CM | POA: Diagnosis not present

## 2017-10-01 ENCOUNTER — Other Ambulatory Visit (INDEPENDENT_AMBULATORY_CARE_PROVIDER_SITE_OTHER): Payer: Self-pay | Admitting: Otolaryngology

## 2017-10-01 DIAGNOSIS — E041 Nontoxic single thyroid nodule: Secondary | ICD-10-CM

## 2017-10-02 ENCOUNTER — Ambulatory Visit (INDEPENDENT_AMBULATORY_CARE_PROVIDER_SITE_OTHER): Payer: Medicare Other | Admitting: *Deleted

## 2017-10-02 DIAGNOSIS — D510 Vitamin B12 deficiency anemia due to intrinsic factor deficiency: Secondary | ICD-10-CM

## 2017-10-02 MED ORDER — CYANOCOBALAMIN 1000 MCG/ML IJ SOLN
1000.0000 ug | Freq: Once | INTRAMUSCULAR | Status: AC
  Administered 2017-10-02: 1000 ug via INTRAMUSCULAR

## 2017-10-03 ENCOUNTER — Encounter (HOSPITAL_COMMUNITY): Payer: Self-pay

## 2017-10-03 ENCOUNTER — Ambulatory Visit (HOSPITAL_COMMUNITY): Admit: 2017-10-03 | Payer: Medicare Other | Admitting: Gastroenterology

## 2017-10-03 SURGERY — COLONOSCOPY
Anesthesia: Moderate Sedation

## 2017-10-09 ENCOUNTER — Ambulatory Visit (HOSPITAL_COMMUNITY)
Admission: RE | Admit: 2017-10-09 | Discharge: 2017-10-09 | Disposition: A | Discharge status: Home / Self Care | Payer: Medicare Other | Source: Ambulatory Visit | Attending: Otolaryngology | Admitting: Otolaryngology

## 2017-10-09 DIAGNOSIS — E041 Nontoxic single thyroid nodule: Secondary | ICD-10-CM | POA: Insufficient documentation

## 2017-10-09 MED ORDER — LIDOCAINE HCL (PF) 2 % IJ SOLN
INTRAMUSCULAR | Status: AC
  Administered 2017-10-09: 10 mL
  Filled 2017-10-09: qty 10

## 2017-10-09 NOTE — Discharge Instructions (Signed)
Thyroid Biopsy °The thyroid gland is a butterfly-shaped gland located in the front of the neck. It produces hormones that affect metabolism, growth and development, and body temperature. Thyroid biopsy is a procedure in which small samples of tissue or fluid are removed from the thyroid gland. The samples are then looked at under a microscope to check for abnormalities. This procedure is done to determine the cause of thyroid problems. It may be done to check for infection, cancer, or other thyroid problems. °Two methods may be used for a thyroid biopsy. In one method, a thin needle is inserted through the skin and into the thyroid gland. In the other method, an open incision is made through the skin. °Tell a health care provider about: °· Any allergies you have. °· All medicines you are taking, including vitamins, herbs, eye drops, creams, and over-the-counter medicines. °· Any problems you or family members have had with anesthetic medicines. °· Any blood disorders you have. °· Any surgeries you have had. °· Any medical conditions you have. °What are the risks? °Generally, this is a safe procedure. However, problems can occur and include: °· Bleeding from the procedure site. °· Infection. °· Injury to structures near the thyroid gland. ° °What happens before the procedure? °· Ask your health care provider about: °? Changing or stopping your regular medicines. This is especially important if you are taking diabetes medicines or blood thinners. °? Taking medicines such as aspirin and ibuprofen. These medicines can thin your blood. Do not take these medicines before your procedure if your health care provider asks you not to. °· Do not eat or drink anything after midnight on the night before the procedure or as directed by your health care provider. °· You may have a blood sample taken. °What happens during the procedure? °Either of these methods may be used to perform a thyroid biopsy: °· Fine needle biopsy. You may  be given medicine to help you relax (sedative). You will be asked to lie on your back with your head tipped backward to extend your neck. An area on your neck will be cleaned. A needle will then be inserted through the skin of your neck. You may be asked to avoid coughing, talking, swallowing, or making sounds during some portions of the procedure. The needle will be withdrawn once the tissue or fluid samples have been removed. Pressure may be applied to your neck to reduce swelling and ensure that bleeding has stopped. The samples will be sent to a lab for examination. °· Open biopsy. You will be given medicine to make you sleep (general anesthetic). An incision will be made in your neck. A sample of thyroid tissue will be removed using surgical tools. The tissue sample will be sent for examination. In some cases, the sample may be examined during the biopsy. If that is done and cancer cells are found, some or all of the thyroid gland may be removed. The incision will be closed with stitches. ° °What happens after the procedure? °· Your recovery will be assessed and monitored. °· You may have soreness and tenderness at the site of the biopsy. This should go away after a few days. °· If you had an open biopsy, you may have a hoarse voice or sore throat for a couple days. °· It is your responsibility to get your test results. °This information is not intended to replace advice given to you by your health care provider. Make sure you discuss any questions you have with   your health care provider. °Document Released: 05/12/2007 Document Revised: 03/17/2016 Document Reviewed: 10/07/2013 °Elsevier Interactive Patient Education © 2018 [employer] ° °

## 2017-10-15 ENCOUNTER — Ambulatory Visit: Payer: Medicare Other | Admitting: Gastroenterology

## 2017-10-24 ENCOUNTER — Encounter: Payer: Self-pay | Admitting: Family Medicine

## 2017-10-24 ENCOUNTER — Other Ambulatory Visit (HOSPITAL_COMMUNITY)
Admission: RE | Admit: 2017-10-24 | Discharge: 2017-10-24 | Disposition: A | Discharge status: Home / Self Care | Payer: Medicare Other | Source: Ambulatory Visit | Attending: Family Medicine | Admitting: Family Medicine

## 2017-10-24 ENCOUNTER — Ambulatory Visit (INDEPENDENT_AMBULATORY_CARE_PROVIDER_SITE_OTHER): Payer: Medicare Other | Admitting: Family Medicine

## 2017-10-24 ENCOUNTER — Ambulatory Visit (HOSPITAL_COMMUNITY)
Admission: RE | Admit: 2017-10-24 | Discharge: 2017-10-24 | Disposition: A | Discharge status: Home / Self Care | Payer: Medicare Other | Source: Ambulatory Visit | Attending: Family Medicine | Admitting: Family Medicine

## 2017-10-24 VITALS — BP 118/72 | Ht 65.0 in | Wt 210.0 lb

## 2017-10-24 DIAGNOSIS — M79606 Pain in leg, unspecified: Secondary | ICD-10-CM

## 2017-10-24 DIAGNOSIS — I82812 Embolism and thrombosis of superficial veins of left lower extremities: Secondary | ICD-10-CM | POA: Insufficient documentation

## 2017-10-24 DIAGNOSIS — M79605 Pain in left leg: Secondary | ICD-10-CM | POA: Diagnosis not present

## 2017-10-24 DIAGNOSIS — E041 Nontoxic single thyroid nodule: Secondary | ICD-10-CM | POA: Diagnosis not present

## 2017-10-24 DIAGNOSIS — D510 Vitamin B12 deficiency anemia due to intrinsic factor deficiency: Secondary | ICD-10-CM | POA: Diagnosis not present

## 2017-10-24 LAB — D-DIMER, QUANTITATIVE (NOT AT ARMC): D DIMER QUANT: 0.62 ug{FEU}/mL — AB (ref 0.00–0.50)

## 2017-10-24 NOTE — Progress Notes (Signed)
   Subjective:    Patient ID: [name], female    DOB: 1950-08-27, 67 y.o.   MRN: [id number]  HPIFell march 25th at work. Pain and swelling in left leg since fall.  Patient slipped on a rug and injured her lower leg is been swelling some tender painful present for the past several days Denies any chest tightness shortness of breath  Pt has had 4 weekly b12 injections. 2/11, 2/21, 2/26, 3/7. Pt was then going to monthly. Wants to know when she should check her b12 level again.  Patient is interested in rechecking her B12 level  Wants to discuss results of thyroid US. Pt has a follow up with dr Benjamine Mola on April 8th.  Patient's ultrasound and her other test showed that the thyroid nodules pressing on the trachea.  Her biopsy was benign but the patient states that the nurse ENT told her to follow-up in 1 year but she feels this needs to be removed     25 minutes was spent with the patient.  This statement verifies that 25 minutes was indeed spent with the patient. Greater than half the time was spent in discussion, counseling and answering questions  regarding the issues that the patient came in for today as reflected in the diagnosis (s) please refer to documentation for further details.     Review of Systems Please see above denies chest tightness pressure pain shortness breath denies difficulty swallowing denies fever chills sweats does relate leg pain    Objective:   Physical Exam Lungs are clear respiratory rate normal heart is regular pulse normal BP is good extremities some swelling in the left calf with some tenderness ankle normal  Because of having a rule out the possibility of DVT along with testing on stat basis 99214 with complexity and potential for negative outcome     Assessment & Plan:  Stat d-dimer and ultrasound was ordered D-dimer positive Ultrasound showed superficial thrombophlebitis Did not show any sign of DVT She is seeing vascular specialist next week we will  communicate with them on Monday asking them to do a follow-up ultrasound of her left leg to rule out DVT progression currently right now does not need to be on anticoagulants  Thyroid nodule we will communicate with ENT office letting them know that the patient wants this area to be removed patient does have an appointment with ENT on Thursday for further discussion.  B12 deficiency is taking the B12 supplement we will check CBC B12  Standard follow-up visits recommended

## 2017-10-26 ENCOUNTER — Telehealth: Payer: Self-pay | Admitting: Family Medicine

## 2017-10-26 ENCOUNTER — Encounter: Payer: Self-pay | Admitting: Family Medicine

## 2017-10-26 NOTE — Telephone Encounter (Signed)
A letter was dictated for Dr.Teoh, please send a letter via fax early this week as well as the most recent office visit thank you

## 2017-10-27 ENCOUNTER — Encounter: Payer: Self-pay | Admitting: Emergency Medicine

## 2017-10-27 ENCOUNTER — Telehealth: Payer: Self-pay | Admitting: Family Medicine

## 2017-10-27 ENCOUNTER — Other Ambulatory Visit: Payer: Self-pay

## 2017-10-27 ENCOUNTER — Encounter: Payer: Self-pay | Admitting: Family Medicine

## 2017-10-27 ENCOUNTER — Emergency Department: Payer: Medicare Other

## 2017-10-27 ENCOUNTER — Emergency Department
Admission: EM | Admit: 2017-10-27 | Discharge: 2017-10-28 | Disposition: A | Discharge status: Home / Self Care | Payer: Medicare Other | Attending: Emergency Medicine | Admitting: Emergency Medicine

## 2017-10-27 DIAGNOSIS — R509 Fever, unspecified: Secondary | ICD-10-CM | POA: Diagnosis not present

## 2017-10-27 DIAGNOSIS — R05 Cough: Secondary | ICD-10-CM | POA: Diagnosis not present

## 2017-10-27 DIAGNOSIS — J189 Pneumonia, unspecified organism: Secondary | ICD-10-CM | POA: Diagnosis not present

## 2017-10-27 DIAGNOSIS — M791 Myalgia, unspecified site: Secondary | ICD-10-CM | POA: Diagnosis not present

## 2017-10-27 DIAGNOSIS — J181 Lobar pneumonia, unspecified organism: Secondary | ICD-10-CM | POA: Diagnosis not present

## 2017-10-27 DIAGNOSIS — Z79899 Other long term (current) drug therapy: Secondary | ICD-10-CM | POA: Insufficient documentation

## 2017-10-27 LAB — INFLUENZA PANEL BY PCR (TYPE A & B)
INFLBPCR: NEGATIVE
Influenza A By PCR: NEGATIVE

## 2017-10-27 MED ORDER — AZITHROMYCIN 500 MG PO TABS
500.0000 mg | ORAL_TABLET | Freq: Once | ORAL | Status: AC
  Administered 2017-10-28: 500 mg via ORAL
  Filled 2017-10-27: qty 1

## 2017-10-27 NOTE — Progress Notes (Signed)
Letter was sent to ENT, notification of test results and dictation sent to vascular-patient is aware to follow-up with vascular on Wednesday for repeat ultrasound

## 2017-10-27 NOTE — ED Triage Notes (Signed)
Pt reports that she has had body aches, cough and fever. Denies any N/V/D. Pt works in [employer] and states that a child that has been sick with the same symptoms keeps coming in.

## 2017-10-27 NOTE — Telephone Encounter (Signed)
The patient has an appointment with vascular surgery on Wednesday.  Please send a copy of most recent ultrasound and d-dimer also send a copy of the most recent office visit note also a letter is being dictated please send a copy of this as well thank you

## 2017-10-27 NOTE — ED Provider Notes (Signed)
[facility] Emergency Department Provider Note ____________________________________________  Time seen: 2337  I have reviewed the triage vital signs and the nursing notes.  HISTORY  Chief Complaint  Influenza  HPI [name] is a 67 y.o. female resents to the ED for evaluation of cough, fevers, and body aches.  Patient denies any nausea, vomiting, dizziness.   Past Medical History:  Diagnosis Date  . Arthritis   . B12 deficiency 07/09/2015  . Crohn disease (HCC) JULY 2011 FEDA   PERSISTENT TI ULCERS seen on CE, NO NSAID USE  . Endometriosis    HISTORY  LEADING TO HYSTERECTOMY  . Fibromyalgia   . Fibromyalgia 12/09/2013  . Gastric ulcer   . GERD (gastroesophageal reflux disease)   . Headache   . Helicobacter pylori gastritis    HISTORY  . History of hiatal hernia   . Hives   . Iron (Fe) deficiency anemia 2009   JAN 2012 NL HB & FERRITIN  . Obesity   . Osteoporosis 11/20/2014  . Plantar fasciitis     Patient Active Problem List   Diagnosis Date Noted  . Thyroid nodule 09/23/2017  . Edema of left lower extremity 08/25/2017  . Thunderclap headache 09/06/2015  . Vision changes 09/06/2015  . Dizziness 09/06/2015  . New onset of headaches after age 50 09/06/2015  . B12 deficiency 07/09/2015  . Acute bilateral lower abdominal pain 12/08/2014  . Osteoporosis 11/20/2014  . Chronic back pain 11/10/2014  . GERD (gastroesophageal reflux disease) 06/16/2014  . Impaired fasting glucose 03/29/2014  . Fibromyalgia 12/09/2013  . Crohn's ileitis (HCC) 01/17/2011  . Iron deficiency anemia 04/10/2010    Past Surgical History:  Procedure Laterality Date  . ABDOMINAL HYSTERECTOMY    . CATARACT EXTRACTION W/PHACO Right 12/05/2015   Procedure: CATARACT EXTRACTION PHACO AND INTRAOCULAR LENS PLACEMENT (IOC);  Surgeon: William Porfilio, MD;  Location: [facility] ORS;  Service: Ophthalmology;  Laterality: Right;  US 00:59.9 AP% 21.9 CDE 13.10 fluid pack lot #  [id number]  . CATARACT EXTRACTION W/PHACO Left 06/24/2016   Procedure: CATARACT EXTRACTION PHACO AND INTRAOCULAR LENS PLACEMENT LEFT EYE; CDE:  8.33;  Surgeon: Kerry Hunt, MD;  Location: [facility] ORS;  Service: Ophthalmology;  Laterality: Left;  . COLONOSCOPY    . ESOPHAGOGASTRODUODENOSCOPY  10/14/2007   Esophagus showed 2 cm pink tongue seen extending from the GE junction.  Biopsies obtained via cold forceps.  Otherwise the  esophagus was without erosions, mass, ulceration, or stricture / . Large hiatal hernia pouch with linear erosions, and two 3-6 mmulcers seen in the pouch.  Biopsies obtained via cold forceps to  evaluate for H. pylori gastritis or malignancy  . Ileocolonoscopy  10/14/2007   Multiple 1-3 mm terminal ileum ulcers biopsied  The ulcers seemed to only involve the last 5 cm of her terminal ileum, and 10 cm of her ileum was visualized.  Otherwise the colon  was without polyps, masses, diverticula, or AVMs.  She had a normal retroflexed view of the rectum  . OVARIAN CYST REMOVAL    . TOTAL ABDOMINAL HYSTERECTOMY W/ BILATERAL SALPINGOOPHORECTOMY    . UPPER GASTROINTESTINAL ENDOSCOPY  JULY 2011 DIARRHEA/FEDA    Bx-H.PYLORI GASTRITIS, NL DUODENUM    Prior to Admission medications   Medication Sig Start Date End Date Taking? Authorizing Provider  acetaminophen (TYLENOL) 325 MG tablet Take 650 mg by mouth as needed for moderate pain or headache.     [provider]  azithromycin (ZITHROMAX Z-PAK) 250 MG tablet Take 1 tablet (250   mg total) by mouth daily for 4 days. 10/28/17 11/01/17  Irva Loser, Dannielle Karvonen, PA-C  benzonatate (TESSALON PERLES) 100 MG capsule Take 1-2 tabs TID prn cough 10/28/17   Jonn Chaikin, Dannielle Karvonen, PA-C  mesalamine (PENTASA) 500 MG CR capsule Take 2 capsules (1,000 mg total) by mouth 4 (four) times daily. Patient not taking: Reported on 10/24/2017 06/05/16   Carlis Stable, NP  Na Sulfate-K Sulfate-Mg Sulf 17.5-3.13-1.6 GM/177ML SOLN Take 1 kit by mouth as  directed. Patient not taking: Reported on 10/24/2017 08/25/17   Danie Binder, MD  omeprazole (PRILOSEC) 20 MG capsule TAKE 1 CAPSULE (20 MG TOTAL) BY MOUTH DAILY. 05/27/16   Carlis Stable, NP  PENTASA 500 MG CR capsule TAKE 2 CAPSULES BY MOUTH 4 TIMES A DAY Patient not taking: Reported on 10/24/2017 08/28/17   Mahala Menghini, PA-C    Allergies Cefzil [cefprozil]; Celebrex [celecoxib]; and Sulfonamide derivatives  Family History  Problem Relation Age of Onset  . Diabetes Mother   . Cancer Father   . Diabetes Father   . Diabetes Sister   . Diabetes Brother   . Colon polyps Neg Hx   . Colon cancer Neg Hx   . Stroke Neg Hx     Social History Social History   Tobacco Use  . Smoking status: Never Smoker  . Smokeless tobacco: Never Used  Substance Use Topics  . Alcohol use: No  . Drug use: No    Review of Systems  Constitutional: Positive for fever. Eyes: Negative for visual changes. ENT: Negative for sore throat. Cardiovascular: Negative for chest pain. Respiratory: Negative for shortness of breath. Reports cough Gastrointestinal: Negative for abdominal pain, vomiting and diarrhea. Genitourinary: Negative for dysuria. Musculoskeletal: Negative for back pain. Notes generalized bodyaches Skin: Negative for rash. Neurological: Negative for headaches, focal weakness or numbness. ____________________________________________  PHYSICAL EXAM:  VITAL SIGNS: ED Triage Vitals  Enc Vitals Group     BP 10/27/17 2123 (!) 158/74     Pulse Rate 10/27/17 2123 (!) 114     Resp 10/27/17 2123 20     Temp 10/27/17 2123 100.3 F (37.9 C)     Temp Source 10/27/17 2123 Oral     SpO2 10/27/17 2123 95 %     Weight 10/27/17 2124 210 lb (95.3 kg)     Height 10/27/17 2124 5' 5" (1.651 m)     Head Circumference --      Peak Flow --      Pain Score 10/27/17 2124 6     Pain Loc --      Pain Edu? --      Excl. in [place]? --     Constitutional: Alert and oriented. Well appearing and in no  distress. Head: Normocephalic and atraumatic. Eyes: Conjunctivae are normal. PERRL. Normal extraocular movements Cardiovascular: Normal rate, regular rhythm. Normal distal pulses. Respiratory: Normal respiratory effort. No wheezes/rales/rhonchi. Gastrointestinal: Soft and nontender. No distention. Musculoskeletal: Nontender with normal range of motion in all extremities.  Neurologic:  Normal gait without ataxia. Normal speech and language. No gross focal neurologic deficits are appreciated. Skin:  Skin is warm, dry and intact. No rash noted. Psychiatric: Mood and affect are normal. Patient exhibits appropriate insight and judgment. ____________________________________________   LABS (pertinent positives/negatives) Labs Reviewed  INFLUENZA PANEL BY PCR (TYPE A & B)  _____________________________   RADIOLOGY  CXR  IMPRESSION: 1. Diffusely increased interstitial opacities suggesting bronchial inflammation. 2. Increased airspace disease in the left lung base posteriorly adjacent  to the hiatal hernia, suspect pulmonary infiltrate in the region. Tiny left pleural effusion or thickening. 3. Large hiatal hernia ____________________________________________  PROCEDURES  Procedures Azithromycin 500 mg PO ____________________________________________  INITIAL IMPRESSION / ASSESSMENT AND PLAN / ED COURSE  She with ED evaluation of a 3-day complaint of intermittent cough, fevers, and body aches.  Patient's exam is overall benign but her x-ray does reveal probable infiltrate at the left lung base.  She will be discharged with a prescription for a azithromycin and Tessalon Perles to dose as directed.  She will follow-up with her primary provider for ongoing worsening symptoms.  Return precautions have been reviewed.  Patient and her husband verbalized understanding of discharge instructions. ____________________________________________  FINAL CLINICAL IMPRESSION(S) / ED DIAGNOSES  Final  diagnoses:  Community acquired pneumonia of left lower lobe of lung (HCC)      ,  V Bacon, PA-C 10/28/17 0003    Quale, Mark, MD 10/28/17 0057  

## 2017-10-28 ENCOUNTER — Telehealth: Payer: Self-pay | Admitting: Family Medicine

## 2017-10-28 ENCOUNTER — Ambulatory Visit: Payer: Medicare Other | Admitting: Family Medicine

## 2017-10-28 DIAGNOSIS — J181 Lobar pneumonia, unspecified organism: Secondary | ICD-10-CM | POA: Diagnosis not present

## 2017-10-28 MED ORDER — BENZONATATE 100 MG PO CAPS: ORAL_CAPSULE | ORAL | 0 refills | Status: DC

## 2017-10-28 MED ORDER — AZITHROMYCIN 250 MG PO TABS: 250.0000 mg | ORAL_TABLET | Freq: Every day | ORAL | 0 refills | Status: AC

## 2017-10-28 NOTE — Telephone Encounter (Signed)
Nurses- please connect with the patient.  She did go to the emergency department yesterday evening (she had an appointment for today at 11 AM) please discuss with the patient how she is doing, also is she planning on doing a follow-up visit today at 11 AM? (At the very least I recommend a follow-up visit within 24-48 hours )

## 2017-10-28 NOTE — Telephone Encounter (Signed)
Spoke with patient. She stated that she has already called and called her 11am appt for today. Nurse informed that Dr. Nicki Reaper wanted to see her today or tomorrow. She said depending on how she felt she would call back and get an appt. She also stated that she just wanted to rest.

## 2017-10-28 NOTE — Discharge Instructions (Signed)
Your x-ray has revealed a pneumonia. Take the antibiotic and cough medicine as directed. Continue to take Tylenol as needed for fevers. Follow-up with your provider or return as needed for worsening symptoms.

## 2017-10-29 ENCOUNTER — Ambulatory Visit (HOSPITAL_COMMUNITY)
Admission: RE | Admit: 2017-10-29 | Discharge: 2017-10-29 | Disposition: A | Discharge status: Home / Self Care | Payer: Medicare Other | Source: Ambulatory Visit | Attending: Vascular Surgery | Admitting: Vascular Surgery

## 2017-10-29 ENCOUNTER — Ambulatory Visit (INDEPENDENT_AMBULATORY_CARE_PROVIDER_SITE_OTHER): Payer: Medicare Other | Admitting: Vascular Surgery

## 2017-10-29 ENCOUNTER — Encounter: Payer: Self-pay | Admitting: Vascular Surgery

## 2017-10-29 ENCOUNTER — Telehealth: Payer: Self-pay | Admitting: Family Medicine

## 2017-10-29 ENCOUNTER — Other Ambulatory Visit: Payer: Self-pay

## 2017-10-29 VITALS — BP 136/73 | HR 103 | Resp 18 | Ht 65.0 in | Wt 205.0 lb

## 2017-10-29 DIAGNOSIS — R600 Localized edema: Secondary | ICD-10-CM | POA: Diagnosis not present

## 2017-10-29 DIAGNOSIS — Z86718 Personal history of other venous thrombosis and embolism: Secondary | ICD-10-CM | POA: Diagnosis not present

## 2017-10-29 NOTE — Progress Notes (Signed)
Patient name: [name] MRN: [id number] DOB: [birth year] Sex: female  REASON FOR CONSULT:   Varicose veins of the left lower extremity.  The consult is requested by Dr. [name]  HPI:   [name] is a pleasant 67 y.o. female who was referred for evaluation of varicose veins in the left lower extremity.  I reviewed the records from the referring office.  The patient was seen on 09/08/2017.  At that time he had a sore throat and a headache.  It was felt that this was a viral upper respiratory infection and there was no evidence of strep.  The patient has been complaining of repetitive episodes of superficial thrombophlebitis and was referred for vascular consultation.  I subsequently received a note from Dr. Wolfgang Phoenix on 10/27/2017.  As the patient has been having some left leg pain she did have a d-dimer obtained which was positive.  Her venous duplex scan showed superficial venous thrombosis but no DVT.  Dr. Wolfgang Phoenix was going to recommend a follow-up duplex scan but then it was noted that she was scheduled to be seen here today.  I did review the venous duplex scan that was done on 10/24/2017.  This was done because of a 5-day history of left leg pain.  There was no evidence of DVT.  There was a small amount of partially occlusive thrombus in the distal great saphenous vein.  The amount of thrombus burden was improved compared to the study back in January 2019.  On my history, the patient does not remember developing phlebitis around the time that she had a duplex scans in January and March.  I think she simply has some leg swelling which prompted the study which showed evidence of superficial thrombophlebitis involving the left great saphenous vein.  More recently she fell at work and landed on her left side and has had pain along the lateral aspect of her leg away from the area of phlebitis.  Of note she works at [employer] and is on her feet most of the day.  She denies any previous history of  DVT.  Past Medical History:  Diagnosis Date  . Arthritis   . B12 deficiency 07/09/2015  . Crohn disease ([place]) JULY 2011 FEDA   PERSISTENT TI ULCERS seen on CE, NO NSAID USE  . Endometriosis    HISTORY  LEADING TO HYSTERECTOMY  . Fibromyalgia   . Fibromyalgia 12/09/2013  . Gastric ulcer   . GERD (gastroesophageal reflux disease)   . Headache   . Helicobacter pylori gastritis    HISTORY  . History of hiatal hernia   . Hives   . Iron (Fe) deficiency anemia 2009   JAN 2012 NL HB & FERRITIN  . Obesity   . Osteoporosis 11/20/2014  . Plantar fasciitis     Family History  Problem Relation Age of Onset  . Diabetes Mother   . Cancer Father   . Diabetes Father   . Diabetes Sister   . Diabetes Brother   . Colon polyps Neg Hx   . Colon cancer Neg Hx   . Stroke Neg Hx     SOCIAL HISTORY: Social History   Tobacco Use  . Smoking status: Never Smoker  . Smokeless tobacco: Never Used  Substance Use Topics  . Alcohol use: No    Allergies  Allergen Reactions  . Cefzil [Cefprozil] Rash  . Celebrex [Celecoxib] Hives and Itching  . Sulfonamide Derivatives Itching    Current Outpatient Medications  Medication Sig Dispense Refill  . acetaminophen (TYLENOL) 325 MG tablet Take 650 mg by mouth as needed for moderate pain or headache.     Marland Kitchen azithromycin (ZITHROMAX Z-PAK) 250 MG tablet Take 1 tablet (250 mg total) by mouth daily for 4 days. 4 each 0  . benzonatate (TESSALON PERLES) 100 MG capsule Take 1-2 tabs TID prn cough 30 capsule 0  . omeprazole (PRILOSEC) 20 MG capsule TAKE 1 CAPSULE (20 MG TOTAL) BY MOUTH DAILY. 31 capsule 11  . mesalamine (PENTASA) 500 MG CR capsule Take 2 capsules (1,000 mg total) by mouth 4 (four) times daily. (Patient not taking: Reported on 10/24/2017) 720 capsule 1  . Na Sulfate-K Sulfate-Mg Sulf 17.5-3.13-1.6 GM/177ML SOLN Take 1 kit by mouth as directed. (Patient not taking: Reported on 10/24/2017) 1 Bottle 0  . PENTASA 500 MG CR capsule TAKE 2 CAPSULES BY  MOUTH 4 TIMES A DAY (Patient not taking: Reported on 10/24/2017) 240 capsule 11   No current facility-administered medications for this visit.     REVIEW OF SYSTEMS:  _0  denotes positive finding, _1  denotes negative finding Cardiac  Comments:  Chest pain or chest pressure:    Shortness of breath upon exertion:    Short of breath when lying flat:    Irregular heart rhythm:        Vascular    Pain in calf, thigh, or hip brought on by ambulation: x   Pain in feet at night that wakes you up from your sleep:     Blood clot in your veins:    Leg swelling:  x       Pulmonary    Oxygen at home:    Productive cough:     Wheezing:         Neurologic    Sudden weakness in arms or legs:     Sudden numbness in arms or legs:     Sudden onset of difficulty speaking or slurred speech:    Temporary loss of vision in one eye:     Problems with dizziness:         Gastrointestinal    Blood in stool:     Vomited blood:         Genitourinary    Burning when urinating:     Blood in urine:        Psychiatric    Major depression:         Hematologic    Bleeding problems:    Problems with blood clotting too easily:        Skin    Rashes or ulcers:        Constitutional    Fever or chills:     PHYSICAL EXAM:   Vitals:   10/29/17 1534  BP: 136/73  Pulse: (!) 103  Resp: 18  SpO2: 94%  Weight: 205 lb (93 kg)  Height: _2  (1.651 m)    GENERAL: The patient is a well-nourished female, in no acute distress. The vital signs are documented above. CARDIAC: There is a regular rate and rhythm.  VASCULAR: I do not detect carotid bruits. She has palpable femoral pulses and pedal pulses bilaterally. She has mild bilateral lower extremity swelling. I do not palpate a cord along the medial aspect of her left leg.  I do not think she has any active phlebitis. She has spider veins and telangiectasias bilaterally. PULMONARY: There is good air exchange bilaterally without wheezing or  rales. ABDOMEN: Soft and non-tender with  normal pitched bowel sounds.  MUSCULOSKELETAL: There are no major deformities or cyanosis. NEUROLOGIC: No focal weakness or paresthesias are detected. SKIN: There are no ulcers or rashes noted. PSYCHIATRIC: The patient has a normal affect.  DATA:    VENOUS DUPLEX LEFT LOWER EXTREMITY: I have independently interpreted her venous duplex of the left lower extremity.  There is no evidence of deep venous thrombosis in the left lower extremity.  There is evidence of superficial thrombosis involving the great saphenous vein in the mid thigh through the calf.  There is deep venous reflux noted in the common femoral vein.  MEDICAL ISSUES:   CHRONIC VENOUS INSUFFICIENCY: Based on her duplex scan she does have evidence of chronic venous insufficiency with resolving superficial thrombosis of a segment of the left great saphenous vein.  Currently there is no evidence of active phlebitis.  There is no evidence of DVT.  I discussed with her the importance of leg elevation and the proper positioning for this.  I have also written her a prescription for knee-high compression stockings with a gradient of 15-20 mmHg.  I have encouraged her to avoid prolonged sitting and standing and to exercise as much as possible.  We also discussed water aerobics which I think is helpful for people with chronic venous insufficiency.  If her symptoms progress then we could try her in thigh-high stockings with a gradient of 20-30 and if this were not successful then we could consider laser ablation of the left great saphenous vein.  She will call if her symptoms progress.  Otherwise I will see her as needed.  Deitra Mayo [facility] [phone]

## 2017-10-29 NOTE — Telephone Encounter (Signed)
Pt called regarding left leg throbbing. States no knots or tingling, but she can barely walk on her left leg. She was seen on 3/29 for leg pain. She has an appt with vascular office at 2:30pm today. She is not sure whether to address her leg pain here or at the vascular doctor. Please advise. Thanks

## 2017-10-29 NOTE — Telephone Encounter (Signed)
It should be noted that the patient did in fact follow-up with vascular surgery they did a Doppler study which did not show DVT and they recommended that she follow-up on a as needed basis they are doing compression stocking they may do ablation in the future

## 2017-10-29 NOTE — Telephone Encounter (Signed)
It is very important to go to the vascular appointment this afternoon they need to do another Doppler study.  It is very important very important for them to make sure that the superficial thrombophlebitis has not progressed into a DVT.  If it has progressed into a DVT she will need to be on anticoagulant- please have the patient call here after her visit with vascular surgery to give [place] an update of what they are planning on doing

## 2017-10-29 NOTE — Telephone Encounter (Signed)
Spoke with patient ; pt understood to give [place] a call back after her appt.

## 2017-10-30 ENCOUNTER — Ambulatory Visit (INDEPENDENT_AMBULATORY_CARE_PROVIDER_SITE_OTHER): Payer: Medicare Other | Admitting: Family Medicine

## 2017-10-30 ENCOUNTER — Encounter: Payer: Self-pay | Admitting: Family Medicine

## 2017-10-30 ENCOUNTER — Ambulatory Visit (INDEPENDENT_AMBULATORY_CARE_PROVIDER_SITE_OTHER): Payer: Self-pay | Admitting: Otolaryngology

## 2017-10-30 ENCOUNTER — Ambulatory Visit (HOSPITAL_COMMUNITY)
Admission: RE | Admit: 2017-10-30 | Discharge: 2017-10-30 | Disposition: A | Discharge status: Home / Self Care | Payer: Medicare Other | Source: Ambulatory Visit | Attending: Family Medicine | Admitting: Family Medicine

## 2017-10-30 VITALS — BP 140/72 | Temp 98.5°F | Ht 65.0 in | Wt 204.0 lb

## 2017-10-30 DIAGNOSIS — M79605 Pain in left leg: Secondary | ICD-10-CM | POA: Insufficient documentation

## 2017-10-30 DIAGNOSIS — J181 Lobar pneumonia, unspecified organism: Secondary | ICD-10-CM | POA: Diagnosis not present

## 2017-10-30 DIAGNOSIS — S8992XA Unspecified injury of left lower leg, initial encounter: Secondary | ICD-10-CM | POA: Diagnosis not present

## 2017-10-30 DIAGNOSIS — J189 Pneumonia, unspecified organism: Secondary | ICD-10-CM

## 2017-10-30 DIAGNOSIS — M79662 Pain in left lower leg: Secondary | ICD-10-CM | POA: Diagnosis not present

## 2017-10-30 MED ORDER — TRAMADOL HCL 50 MG PO TABS: 50.0000 mg | ORAL_TABLET | Freq: Three times a day (TID) | ORAL | 0 refills | Status: AC | PRN

## 2017-10-30 MED ORDER — DOXYCYCLINE HYCLATE 100 MG PO CAPS
100.0000 mg | ORAL_CAPSULE | Freq: Two times a day (BID) | ORAL | 0 refills | Status: DC
Start: 2017-10-30 — End: 2018-04-08

## 2017-10-30 NOTE — Telephone Encounter (Signed)
Patient called back this morning. Stated she did not have a good night due to coughing and sweating. States her leg is still throbbing. Pt is getting an appt for this afternoon at 4pm. Pt states she needs to get someone to bring her and she can only get a ride this afternoon.

## 2017-10-30 NOTE — Telephone Encounter (Signed)
We will check the patient out possibility patient may have to be admitted into the hospital if she is getting worse patient was seen by vascular surgeon yesterday who did a repeat Doppler study which was negative for DVT

## 2017-10-30 NOTE — Progress Notes (Signed)
   Subjective:    Patient ID: [name], female    DOB: 1951/03/22, 67 y.o.   MRN: [id number]  HPI  Patient is here today with lower left leg pain after a fall at work. Was seen last week for it and was sent for xray for DVT per pt no clots found. Went to the Vascular  I reviewed over her chest x-ray talked with her at length about the pneumonia she states she has had sweats and chills during the night but none during the day denies high fever chills or sweats Dr.Dickson whom did u/s and they did not find anything either. She does relate left leg pain discomfort hurts with certain movements she did take a fall for which her leg went up underneath her.  Denies any other particular troubles hurts a lot when she walks on it Review of Systems  Constitutional: Negative for activity change and fever.  HENT: Positive for congestion and rhinorrhea. Negative for ear pain.   Eyes: Negative for discharge.  Respiratory: Positive for cough. Negative for shortness of breath and wheezing.   Cardiovascular: Negative for chest pain.       Objective:   Physical Exam  Constitutional: She appears well-developed.  HENT:  Head: Normocephalic.  Right Ear: External ear normal.  Left Ear: External ear normal.  Nose: Nose normal.  Mouth/Throat: Oropharynx is clear and moist. No oropharyngeal exudate.  Eyes: Right eye exhibits no discharge. Left eye exhibits no discharge.  Neck: Neck supple. No tracheal deviation present.  Cardiovascular: Normal rate and normal heart sounds.  No murmur heard. Pulmonary/Chest: Effort normal and breath sounds normal. She has no wheezes. She has no rales.  Lymphadenopathy:    She has no cervical adenopathy.  Skin: Skin is warm and dry.  Nursing note and vitals reviewed.  Tenderness along the fibula region needs a x-ray Minimal tenderness Doppler study yesterday negative for DVT       Assessment & Plan:  Leg pain recommend Tylenol recommend tramadol when at  home Recommend rest cool compresses keep elevated when possible  Pneumonia add additional antibiotic to finish out Zithromax repeat chest x-ray in 3-4 weeks follow-up additional testing if it does not clear up warning signs discussed if ongoing troubles or worse follow-up  Follow-up x-ray of the left lower leg shows a possible proximal fibular fracture nondisplaced we will do our best to try to get the patient in with orthopedics on Friday

## 2017-10-31 ENCOUNTER — Other Ambulatory Visit: Payer: Self-pay | Admitting: Family Medicine

## 2017-10-31 ENCOUNTER — Telehealth: Payer: Self-pay | Admitting: Family Medicine

## 2017-10-31 ENCOUNTER — Telehealth: Payer: Self-pay | Admitting: Orthopedic Surgery

## 2017-10-31 DIAGNOSIS — M79605 Pain in left leg: Secondary | ICD-10-CM

## 2017-10-31 NOTE — Progress Notes (Signed)
Referral placed in Epic.

## 2017-10-31 NOTE — Telephone Encounter (Signed)
Informed patient that ortho would not be able to get her in until she has filed a proper claim through her employer for Workers Comp. She can use a cane or crutches for the next few day.  Informed patient that her employer needs to help her with Workers Comp soon so she can be seen by ortho. Dr. Aline Brochure office contacted [name] and patient stated she did text a coworker about Workers Comp.

## 2017-10-31 NOTE — Telephone Encounter (Signed)
Per call received today, 10/29/17, from patient's primary care, Dr [name] per Lavella Lemons, regarding newly diagnosed fracture of fibia.  Said related to a recent fall at work.  Spoke with patient, and she relays she has not yet spoken with her employer, as she was just notified with results by Dr Wolfgang Phoenix.  Spoke with Dr Aline Brochure, and per Workers comp protocol, approval/authorization is needed in order to schedule and for insurance to be filed appropriately.  Patient aware. Called back to [place] at Dr Lance Sell office to notify.

## 2017-11-03 ENCOUNTER — Telehealth: Payer: Self-pay | Admitting: Orthopedic Surgery

## 2017-11-03 NOTE — Telephone Encounter (Signed)
In reference to note regarding scheduling appointment for fracture of patient's left lower leg, received call from workers comp adjuster Marni Griffon of [employer] - Ph# [phone] / Fax# [phone], per patient's and employer's call to her in regard to this work-related injury, date of injury 10/20/17. Received CLAIM# [id number]. Billing information also received. ([address] / [name],    Our Worker's comp set up forms have been faxed and are forthcoming for appointment scheduled: 11/05/17, 10:50a.m. I called patient to notify, she returned call and confirmed appointment.

## 2017-11-05 ENCOUNTER — Encounter: Payer: Self-pay | Admitting: Orthopedic Surgery

## 2017-11-05 ENCOUNTER — Other Ambulatory Visit (HOSPITAL_COMMUNITY): Payer: Self-pay

## 2017-11-05 ENCOUNTER — Ambulatory Visit (HOSPITAL_COMMUNITY): Payer: Self-pay | Admitting: Internal Medicine

## 2017-11-05 ENCOUNTER — Ambulatory Visit (INDEPENDENT_AMBULATORY_CARE_PROVIDER_SITE_OTHER): Payer: Worker's Compensation

## 2017-11-05 ENCOUNTER — Ambulatory Visit (INDEPENDENT_AMBULATORY_CARE_PROVIDER_SITE_OTHER): Payer: Worker's Compensation | Admitting: Orthopedic Surgery

## 2017-11-05 VITALS — BP 136/80 | HR 101 | Ht 65.0 in | Wt 204.0 lb

## 2017-11-05 DIAGNOSIS — M25572 Pain in left ankle and joints of left foot: Secondary | ICD-10-CM

## 2017-11-05 DIAGNOSIS — S82832A Other fracture of upper and lower end of left fibula, initial encounter for closed fracture: Secondary | ICD-10-CM | POA: Diagnosis not present

## 2017-11-05 NOTE — Progress Notes (Signed)
NEW PATIENT OFFICE VISIT   Chief Complaint  Patient presents with  . Leg Injury    ? left proximan fibular fracture injury at work tripped on carpet 10/20/17   Work related   67 year old female works at [employer].  She was injured on March 25 fell at work.  EMS came out to see her thought she was okay and she went home came back to work and last worked on March 30  When she went to see her primary care doctor her leg was swollen (history of peripheral edema) and diagnosis of potential DVT was entertained.  No evidence of deep vein thrombosis was noted in the left lower extremity however there was a small amount of partially occlusive thrombus in the distal greater saphenous vein which was superficial and it was thought that at that time the thrombus burden in the greater saphenous vein had improved from January 30 of 2019 date of this ultrasound was October 24, 2017 Dr. Wolfgang Phoenix is following that  Left tib-fib x-ray showed a nondisplaced fracture proximal fibula  She is complaining of lower left leg pain calf lower leg distal medial ankle   Review of Systems  Constitutional: Negative for chills, fever and malaise/fatigue.  Respiratory: Negative for shortness of breath.        Recently treated for pneumonia last week  Neurological: Negative for tingling.     Past Medical History:  Diagnosis Date  . Arthritis   . B12 deficiency 07/09/2015  . Crohn disease ([place]) JULY 2011 FEDA   PERSISTENT TI ULCERS seen on CE, NO NSAID USE  . Endometriosis    HISTORY  LEADING TO HYSTERECTOMY  . Fibromyalgia   . Fibromyalgia 12/09/2013  . Gastric ulcer   . GERD (gastroesophageal reflux disease)   . Headache   . Helicobacter pylori gastritis    HISTORY  . History of hiatal hernia   . Hives   . Iron (Fe) deficiency anemia 2009   JAN 2012 NL HB & FERRITIN  . Obesity   . Osteoporosis 11/20/2014  . Plantar fasciitis     Past Surgical History:  Procedure Laterality Date  . ABDOMINAL  HYSTERECTOMY    . CATARACT EXTRACTION W/PHACO Right 12/05/2015   Procedure: CATARACT EXTRACTION PHACO AND INTRAOCULAR LENS PLACEMENT (IOC);  Surgeon: Birder Robson, MD;  Location: [facility] ORS;  Service: Ophthalmology;  Laterality: Right;  [place] 00:59.9 AP% 21.9 CDE 13.10 fluid pack lot # [id number]  . CATARACT EXTRACTION W/PHACO Left 06/24/2016   Procedure: CATARACT EXTRACTION PHACO AND INTRAOCULAR LENS PLACEMENT LEFT EYE; CDE:  8.33;  Surgeon: Tonny Branch, MD;  Location: [facility];  Service: Ophthalmology;  Laterality: Left;  . COLONOSCOPY    . ESOPHAGOGASTRODUODENOSCOPY  10/14/2007   Esophagus showed 2 cm pink tongue seen extending from the GE junction.  Biopsies obtained via cold forceps.  Otherwise the  esophagus was without erosions, mass, ulceration, or stricture / . Large hiatal hernia pouch with linear erosions, and two 3-6 mmulcers seen in the pouch.  Biopsies obtained via cold forceps to  evaluate for H. pylori gastritis or malignancy  . Ileocolonoscopy  10/14/2007   Multiple 1-3 mm terminal ileum ulcers biopsied  The ulcers seemed to only involve the last 5 cm of her terminal ileum, and 10 cm of her ileum was visualized.  Otherwise the colon  was without polyps, masses, diverticula, or AVMs.  She had a normal retroflexed view of the rectum  . OVARIAN CYST REMOVAL    . TOTAL ABDOMINAL HYSTERECTOMY  W/ BILATERAL SALPINGOOPHORECTOMY    . UPPER GASTROINTESTINAL ENDOSCOPY  JULY 2011 DIARRHEA/FEDA    Bx-H.PYLORI GASTRITIS, NL DUODENUM    Family History  Problem Relation Age of Onset  . Diabetes Mother   . Cancer Father   . Diabetes Father   . Diabetes Sister   . Diabetes Brother   . Colon polyps Neg Hx   . Colon cancer Neg Hx   . Stroke Neg Hx    Social History   Tobacco Use  . Smoking status: Never Smoker  . Smokeless tobacco: Never Used  Substance Use Topics  . Alcohol use: No  . Drug use: No    @ALL @  Current Meds  Medication Sig  . acetaminophen (TYLENOL) 325 MG tablet Take  650 mg by mouth as needed for moderate pain or headache.   . doxycycline (VIBRAMYCIN) 100 MG capsule Take 1 capsule (100 mg total) by mouth 2 (two) times daily.  Marland Kitchen omeprazole (PRILOSEC) 20 MG capsule TAKE 1 CAPSULE (20 MG TOTAL) BY MOUTH DAILY.    BP 136/80   Pulse (!) 101   Ht 5' 5"  (1.651 m)   Wt 204 lb (92.5 kg)   BMI 33.95 kg/m   Physical Exam  Constitutional: She is oriented to person, place, and time. She appears well-developed and well-nourished.  Musculoskeletal:       Legs: Neurological: She is alert and oriented to person, place, and time. Gait abnormal.  Psychiatric: She has a normal mood and affect. Judgment normal.  Vitals reviewed.   Ortho Exam  MEDICAL DECISION SECTION  xrays ordered? yes  My independent reading of xrays: The initial tib-fib x-ray shows a fibular fracture but the ankle is poorly visualized  To rule out the possibility of a Weber C type fracture ankle x-ray was ordered.  The patient went to the vascular center on April third for further images which showed left abnormal reflux were noted in the common femoral vein great saphenous vein at the groin great saphenous vein at the proximal thigh great saphenous vein at mid thigh great saphenous vein at the distal thigh great saphenous vein at the knee and great saphenous vein at the proximal calf abnormalities consistent with the sequela of a prior venous obstructive process with findings that appear chronic long-standing in nature are identified in the great saphenous vein mid thigh throughout the calf there is no evidence of deep vein thrombosis   Encounter Diagnoses  Name Primary?  . Acute left ankle pain   . Other closed fracture of proximal end of left fibula, initial encounter Yes     PLAN:   Weight-bear as tolerated with a walker  Out of work until pain level subsides enough to weight-bear without a walker  Primary care follow-up related to DVT symptoms or signs which were reviewed

## 2017-11-05 NOTE — Patient Instructions (Addendum)
Take a regular aspirin once a day  OOW X 4 WEEKS   USE THE WALKER NEXT 4 WEEKS   IF YOU HAVE INCREASED SWELLING OR SHORTNESS OF BREATH CALL DR Lance Sell OFFICE OR EMS IF SEVERE SHORTNESS OF BREATH

## 2017-11-24 ENCOUNTER — Inpatient Hospital Stay (HOSPITAL_COMMUNITY): Payer: Medicare Other

## 2017-11-24 ENCOUNTER — Inpatient Hospital Stay (HOSPITAL_COMMUNITY): Payer: Medicare Other | Attending: Internal Medicine | Admitting: Internal Medicine

## 2017-11-24 ENCOUNTER — Encounter (HOSPITAL_COMMUNITY): Payer: Self-pay | Admitting: Internal Medicine

## 2017-11-24 ENCOUNTER — Other Ambulatory Visit: Payer: Self-pay

## 2017-11-24 VITALS — BP 139/48 | HR 65 | Temp 98.1°F | Resp 16 | Ht 65.0 in | Wt 208.0 lb

## 2017-11-24 DIAGNOSIS — K909 Intestinal malabsorption, unspecified: Secondary | ICD-10-CM | POA: Diagnosis not present

## 2017-11-24 DIAGNOSIS — K5000 Crohn's disease of small intestine without complications: Secondary | ICD-10-CM | POA: Insufficient documentation

## 2017-11-24 DIAGNOSIS — Z79899 Other long term (current) drug therapy: Secondary | ICD-10-CM | POA: Diagnosis not present

## 2017-11-24 DIAGNOSIS — D500 Iron deficiency anemia secondary to blood loss (chronic): Secondary | ICD-10-CM

## 2017-11-24 DIAGNOSIS — E538 Deficiency of other specified B group vitamins: Secondary | ICD-10-CM | POA: Insufficient documentation

## 2017-11-24 LAB — CBC WITH DIFFERENTIAL/PLATELET
Basophils Absolute: 0 10*3/uL (ref 0.0–0.1)
Basophils Relative: 0 %
EOS ABS: 0.1 10*3/uL (ref 0.0–0.7)
EOS PCT: 2 %
HCT: 34 % — ABNORMAL LOW (ref 36.0–46.0)
Hemoglobin: 10.4 g/dL — ABNORMAL LOW (ref 12.0–15.0)
LYMPHS ABS: 2.1 10*3/uL (ref 0.7–4.0)
LYMPHS PCT: 27 %
MCH: 25.2 pg — AB (ref 26.0–34.0)
MCHC: 30.6 g/dL (ref 30.0–36.0)
MCV: 82.3 fL (ref 78.0–100.0)
MONO ABS: 0.6 10*3/uL (ref 0.1–1.0)
Monocytes Relative: 8 %
Neutro Abs: 4.9 10*3/uL (ref 1.7–7.7)
Neutrophils Relative %: 63 %
PLATELETS: 235 10*3/uL (ref 150–400)
RBC: 4.13 MIL/uL (ref 3.87–5.11)
RDW: 13.9 % (ref 11.5–15.5)
WBC: 7.7 10*3/uL (ref 4.0–10.5)

## 2017-11-24 LAB — IRON AND TIBC
Iron: 33 ug/dL (ref 28–170)
Saturation Ratios: 11 % (ref 10.4–31.8)
TIBC: 302 ug/dL (ref 250–450)
UIBC: 269 ug/dL

## 2017-11-24 LAB — FERRITIN: FERRITIN: 18 ng/mL (ref 11–307)

## 2017-11-27 ENCOUNTER — Telehealth (HOSPITAL_COMMUNITY): Payer: Self-pay

## 2017-11-27 NOTE — Telephone Encounter (Signed)
Patient called requesting results of recent labs. She states she thinks she needs an iron infusion because she is having symptoms like SOB with exertion and fatigue. Reviewed with Dr. Walden Field. She ordered injectafer iron infusion for patient x 2 doses 1 week apart. Called patient to let her know and had to leave a message. Message also sent to pharmacist to build treatment and scheduling to schedule appointments.

## 2017-11-28 NOTE — Progress Notes (Signed)
Diagnosis Iron deficiency anemia due to chronic blood loss - Plan: CBC with Differential/Platelet, Comprehensive metabolic panel, Lactate dehydrogenase, Ferritin, CBC with Differential/Platelet, Comprehensive metabolic panel, Ferritin, Lactate dehydrogenase  Staging Cancer Staging No matching staging information was found for the patient.  Assessment and Plan:  1.  Iron deficiency anemia.  Pt was previously followed by Dr. Whitney Muse and was felt to have anemia due to chronic blood loss and malabsorption, requiring intermittent IV iron.  Labs done 11/24/2017 show a hemoglobin 10.4 ferritin of 18.  She will be given option of repeat IV iron with Injectafer.   She was last treated with IV in January 2019.  She will have repeat labs in 3-6 months.  Follow-up in 6-12 months.    2.  B12 deficiency.  Pt now on PO B12 replacement therapy.  3.  Chron's disease.  Follow-up with GI as recommended.    4.  Health maintenance.  Continue GI and mammogram evaluation as recommended.     Current Status:  Pt is seen today for follow-up to go over recent labs.    Problem List Patient Active Problem List   Diagnosis Date Noted  . Thyroid nodule [E04.1] 09/23/2017  . Edema of left lower extremity [R60.0] 08/25/2017  . [place] headache [G44.53] 09/06/2015  . Vision changes [H53.9] 09/06/2015  . Dizziness [R42] 09/06/2015  . New onset of headaches after age 89 [R51] 09/06/2015  . B12 deficiency [E53.8] 07/09/2015  . Acute bilateral lower abdominal pain [R10.31, R10.32] 12/08/2014  . Osteoporosis [M81.0] 11/20/2014  . Chronic back pain [M54.9, G89.29] 11/10/2014  . GERD (gastroesophageal reflux disease) [K21.9] 06/16/2014  . Impaired fasting glucose [R73.01] 03/29/2014  . Fibromyalgia [M79.7] 12/09/2013  . Crohn's ileitis ([place]) [W97.98] 01/17/2011  . Iron deficiency anemia [D50.9] 04/10/2010    Past Medical History Past Medical History:  Diagnosis Date  . Arthritis   . B12 deficiency  07/09/2015  . Crohn disease ([place]) JULY 2011 FEDA   PERSISTENT TI ULCERS seen on CE, NO NSAID USE  . Endometriosis    HISTORY  LEADING TO HYSTERECTOMY  . Fibromyalgia   . Fibromyalgia 12/09/2013  . Gastric ulcer   . GERD (gastroesophageal reflux disease)   . Headache   . Helicobacter pylori gastritis    HISTORY  . History of hiatal hernia   . Hives   . Iron (Fe) deficiency anemia 2009   JAN 2012 NL HB & FERRITIN  . Obesity   . Osteoporosis 11/20/2014  . Plantar fasciitis     Past Surgical History Past Surgical History:  Procedure Laterality Date  . ABDOMINAL HYSTERECTOMY    . CATARACT EXTRACTION W/PHACO Right 12/05/2015   Procedure: CATARACT EXTRACTION PHACO AND INTRAOCULAR LENS PLACEMENT (IOC);  Surgeon: Birder Robson, MD;  Location: [facility] ORS;  Service: Ophthalmology;  Laterality: Right;  [place] 00:59.9 AP% 21.9 CDE 13.10 fluid pack lot # [id number]  . CATARACT EXTRACTION W/PHACO Left 06/24/2016   Procedure: CATARACT EXTRACTION PHACO AND INTRAOCULAR LENS PLACEMENT LEFT EYE; CDE:  8.33;  Surgeon: Tonny Branch, MD;  Location: [facility];  Service: Ophthalmology;  Laterality: Left;  . COLONOSCOPY    . ESOPHAGOGASTRODUODENOSCOPY  10/14/2007   Esophagus showed 2 cm pink tongue seen extending from the GE junction.  Biopsies obtained via cold forceps.  Otherwise the  esophagus was without erosions, mass, ulceration, or stricture / . Large hiatal hernia pouch with linear erosions, and two 3-6 mmulcers seen in the pouch.  Biopsies obtained via cold forceps to  evaluate for H. pylori  gastritis or malignancy  . Ileocolonoscopy  10/14/2007   Multiple 1-3 mm terminal ileum ulcers biopsied  The ulcers seemed to only involve the last 5 cm of her terminal ileum, and 10 cm of her ileum was visualized.  Otherwise the colon  was without polyps, masses, diverticula, or AVMs.  She had a normal retroflexed view of the rectum  . OVARIAN CYST REMOVAL    . TOTAL ABDOMINAL HYSTERECTOMY W/ BILATERAL  SALPINGOOPHORECTOMY    . UPPER GASTROINTESTINAL ENDOSCOPY  JULY 2011 DIARRHEA/FEDA    Bx-H.PYLORI GASTRITIS, NL DUODENUM    Family History Family History  Problem Relation Age of Onset  . Diabetes Mother   . Cancer Father   . Diabetes Father   . Diabetes Sister   . Diabetes Brother   . Colon polyps Neg Hx   . Colon cancer Neg Hx   . Stroke Neg Hx      Social History  reports that she has never smoked. She has never used smokeless tobacco. She reports that she does not drink alcohol or use drugs.  Medications  Current Outpatient Medications:  .  acetaminophen (TYLENOL) 325 MG tablet, Take 650 mg by mouth as needed for moderate pain or headache. , Disp: , Rfl:  .  doxycycline (VIBRAMYCIN) 100 MG capsule, Take 1 capsule (100 mg total) by mouth 2 (two) times daily., Disp: 20 capsule, Rfl: 0 .  omeprazole (PRILOSEC) 20 MG capsule, TAKE 1 CAPSULE (20 MG TOTAL) BY MOUTH DAILY., Disp: 31 capsule, Rfl: 11  Allergies Cefzil [cefprozil]; Celebrex [celecoxib]; and Sulfonamide derivatives  Review of Systems Review of Systems - Oncology ROS as per HPI otherwise 12 point ROS is negative.   Physical Exam  Vitals Wt Readings from Last 3 Encounters:  11/24/17 208 lb (94.3 kg)  11/05/17 204 lb (92.5 kg)  10/30/17 204 lb 0.2 oz (92.5 kg)   Temp Readings from Last 3 Encounters:  11/24/17 98.1 F (36.7 C) (Oral)  10/30/17 98.5 F (36.9 C) (Oral)  10/27/17 100.3 F (37.9 C) (Oral)   BP Readings from Last 3 Encounters:  11/24/17 (!) 139/48  11/05/17 136/80  10/30/17 140/72   Pulse Readings from Last 3 Encounters:  11/24/17 65  11/05/17 (!) 101  10/29/17 (!) 103   Constitutional: Well-developed, well-nourished, and in no distress.   HENT: Head: Normocephalic and atraumatic.  Mouth/Throat: No oropharyngeal exudate. Mucosa moist. Eyes: Pupils are equal, round, and reactive to light. Conjunctivae are normal. No scleral icterus.  Neck: Normal range of motion. Neck supple. No JVD  present.  Cardiovascular: Normal rate, regular rhythm and normal heart sounds.  Exam reveals no gallop and no friction rub.   No murmur heard. Pulmonary/Chest: Effort normal and breath sounds normal. No respiratory distress. No wheezes.No rales.  Abdominal: Soft. Bowel sounds are normal. No distension. There is no tenderness. There is no guarding.  Musculoskeletal: No edema or tenderness.  Lymphadenopathy: No cervical, axillary or supraclavicular adenopathy.  Neurological: Alert and oriented to person, place, and time. No cranial nerve deficit.  Skin: Skin is warm and dry. No rash noted. No erythema. No pallor.  Psychiatric: Affect and judgment normal.   Labs Lab on 11/24/2017  Component Date Value Ref Range Status  . WBC 11/24/2017 7.7  4.0 - 10.5 K/uL Final  . RBC 11/24/2017 4.13  3.87 - 5.11 MIL/uL Final  . Hemoglobin 11/24/2017 10.4* 12.0 - 15.0 g/dL Final  . HCT 11/24/2017 34.0* 36.0 - 46.0 % Final  . MCV 11/24/2017 82.3  78.0 -  100.0 fL Final  . MCH 11/24/2017 25.2* 26.0 - 34.0 pg Final  . MCHC 11/24/2017 30.6  30.0 - 36.0 g/dL Final  . RDW 11/24/2017 13.9  11.5 - 15.5 % Final  . Platelets 11/24/2017 235  150 - 400 K/uL Final  . Neutrophils Relative % 11/24/2017 63  % Final  . Neutro Abs 11/24/2017 4.9  1.7 - 7.7 K/uL Final  . Lymphocytes Relative 11/24/2017 27  % Final  . Lymphs Abs 11/24/2017 2.1  0.7 - 4.0 K/uL Final  . Monocytes Relative 11/24/2017 8  % Final  . Monocytes Absolute 11/24/2017 0.6  0.1 - 1.0 K/uL Final  . Eosinophils Relative 11/24/2017 2  % Final  . Eosinophils Absolute 11/24/2017 0.1  0.0 - 0.7 K/uL Final  . Basophils Relative 11/24/2017 0  % Final  . Basophils Absolute 11/24/2017 0.0  0.0 - 0.1 K/uL Final   Performed at [facility], [address] [place], [place] [address]  . Ferritin 11/24/2017 18  11 - 307 ng/mL Final   Performed at [facility], [place] [address], [place], [place] [address]  . Iron 11/24/2017 33  28 - 170 ug/dL Final  . TIBC  11/24/2017 302  250 - 450 ug/dL Final  . Saturation Ratios 11/24/2017 11  10.4 - 31.8 % Final  . UIBC 11/24/2017 269  ug/dL Final   Performed at [facility], [place] [address], [place], [place] [address]     Pathology Orders Placed This Encounter  Procedures  . CBC with Differential/Platelet    Standing Status:   Future    Standing Expiration Date:   11/25/2018  . Comprehensive metabolic panel    Standing Status:   Future    Standing Expiration Date:   11/25/2018  . Lactate dehydrogenase    Standing Status:   Future    Standing Expiration Date:   11/25/2018  . Ferritin    Standing Status:   Future    Standing Expiration Date:   11/25/2018  . CBC with Differential/Platelet    Standing Status:   Future    Standing Expiration Date:   11/25/2018  . Comprehensive metabolic panel    Standing Status:   Future    Standing Expiration Date:   11/25/2018  . Ferritin    Standing Status:   Future    Standing Expiration Date:   11/25/2018  . Lactate dehydrogenase    Standing Status:   Future    Standing Expiration Date:   11/25/2018       Zoila Shutter MD

## 2017-12-02 ENCOUNTER — Inpatient Hospital Stay (HOSPITAL_COMMUNITY): Payer: Medicare Other | Attending: Internal Medicine

## 2017-12-02 ENCOUNTER — Encounter (HOSPITAL_COMMUNITY): Payer: Self-pay

## 2017-12-02 VITALS — BP 116/42 | HR 74 | Temp 98.6°F | Resp 16

## 2017-12-02 DIAGNOSIS — E538 Deficiency of other specified B group vitamins: Secondary | ICD-10-CM

## 2017-12-02 DIAGNOSIS — D500 Iron deficiency anemia secondary to blood loss (chronic): Secondary | ICD-10-CM | POA: Insufficient documentation

## 2017-12-02 DIAGNOSIS — S82832A Other fracture of upper and lower end of left fibula, initial encounter for closed fracture: Secondary | ICD-10-CM | POA: Insufficient documentation

## 2017-12-02 DIAGNOSIS — Z79899 Other long term (current) drug therapy: Secondary | ICD-10-CM | POA: Diagnosis not present

## 2017-12-02 MED ORDER — SODIUM CHLORIDE 0.9 % IV SOLN
INTRAVENOUS | Status: DC
  Administered 2017-12-02: 14:00:00 via INTRAVENOUS

## 2017-12-02 MED ORDER — SODIUM CHLORIDE 0.9 % IV SOLN
750.0000 mg | Freq: Once | INTRAVENOUS | Status: AC
  Administered 2017-12-02: 750 mg via INTRAVENOUS
  Filled 2017-12-02: qty 15

## 2017-12-02 NOTE — Progress Notes (Signed)
[name] tolerated Injectafer infusion well without complaints or incident. VSS upon discharge. Pt discharged self ambulatory in satisfactory condition accompanied by family member

## 2017-12-02 NOTE — Patient Instructions (Signed)
[facility] at [facility] Discharge Instructions  Received Injectafer infusion today. Follow-up as scheduled. Call clinic for any questions or concerns   Thank you for choosing [facility] at [facility] to provide your oncology and hematology care.  To afford each patient quality time with our provider, please arrive at least 15 minutes before your scheduled appointment time.   If you have a lab appointment with the Cancer Center please come in thru the  Main Entrance and check in at the main information desk  You need to re-schedule your appointment should you arrive 10 or more minutes late.  We strive to give you quality time with our providers, and arriving late affects you and other patients whose appointments are after yours.  Also, if you no show three or more times for appointments you may be dismissed from the clinic at the providers discretion.     Again, thank you for choosing [facility].  Our hope is that these requests will decrease the amount of time that you wait before being seen by our physicians.       _____________________________________________________________  Should you have questions after your visit to [facility], please contact our office at [phone] between the hours of 8:30 a.m. and 4:30 p.m.  Voicemails left after 4:30 p.m. will not be returned until the following business day.  For prescription refill requests, have your pharmacy contact our office.       Resources For Cancer Patients and their Caregivers ? American Cancer Society: Can assist with transportation, wigs, general needs, runs Look Good Feel Better.        1-888-227-6333 ? Cancer Care: Provides financial assistance, online support groups, medication/co-pay assistance.  1-800-813-HOPE (4673) ? [facility] Assists [place] cancer patients and their families through emotional , educational and  financial support.  336-427-4357 ? [place] DSS Where to apply for food stamps, Medicaid and utility assistance. 336-342-1394 ? [facility]: Transportation to medical appointments. 336-347-2287 ? Social Security Administration: May apply for disability if have a Stage IV cancer. 336-342-7796 1-800-772-1213 ? [place] Aging, Disability and Transit Services: Assists with nutrition, care and transit needs. 336-349-2343  Cancer Center Support Programs:   > Cancer Support Group  2nd Tuesday of the month 1pm-2pm, Journey Room   > Creative Journey  3rd Tuesday of the month 1130am-1pm, Journey Room    

## 2017-12-03 ENCOUNTER — Ambulatory Visit (INDEPENDENT_AMBULATORY_CARE_PROVIDER_SITE_OTHER): Payer: Worker's Compensation | Admitting: Orthopedic Surgery

## 2017-12-03 ENCOUNTER — Encounter: Payer: Self-pay | Admitting: Orthopedic Surgery

## 2017-12-03 DIAGNOSIS — S82832D Other fracture of upper and lower end of left fibula, subsequent encounter for closed fracture with routine healing: Secondary | ICD-10-CM

## 2017-12-03 NOTE — Patient Instructions (Signed)
Ret to work mon no restrictions

## 2017-12-03 NOTE — Progress Notes (Signed)
Chief Complaint  Patient presents with  . Follow-up    Recheck on left fracture of proximal end of fibula., DOI 10-20-17. Worker's Comp    Recheck left proximal fibular fracture patient is doing well progressing with increased activity  She still using her cane  Exam shows no tenderness full range of motion knee and ankle  Recommend return to work Monday.  From now until Monday practice walking without the cane  No restrictions follow-up as needed

## 2017-12-03 NOTE — Addendum Note (Signed)
Addended by: Carole Civil on: 12/03/2017 10:37 AM   Modules accepted: Level of Service

## 2017-12-10 ENCOUNTER — Encounter (HOSPITAL_COMMUNITY): Payer: Self-pay

## 2017-12-10 ENCOUNTER — Inpatient Hospital Stay (HOSPITAL_COMMUNITY): Payer: Medicare Other

## 2017-12-10 VITALS — BP 142/52 | HR 62 | Temp 97.6°F | Resp 18

## 2017-12-10 DIAGNOSIS — E538 Deficiency of other specified B group vitamins: Secondary | ICD-10-CM

## 2017-12-10 DIAGNOSIS — Z79899 Other long term (current) drug therapy: Secondary | ICD-10-CM | POA: Diagnosis not present

## 2017-12-10 DIAGNOSIS — D500 Iron deficiency anemia secondary to blood loss (chronic): Secondary | ICD-10-CM | POA: Diagnosis not present

## 2017-12-10 MED ORDER — SODIUM CHLORIDE 0.9 % IV SOLN
INTRAVENOUS | Status: DC
  Administered 2017-12-10: 12:00:00 via INTRAVENOUS

## 2017-12-10 MED ORDER — SODIUM CHLORIDE 0.9% FLUSH
10.0000 mL | Freq: Once | INTRAVENOUS | Status: AC
  Administered 2017-12-10: 10 mL via INTRAVENOUS

## 2017-12-10 MED ORDER — SODIUM CHLORIDE 0.9 % IV SOLN
750.0000 mg | Freq: Once | INTRAVENOUS | Status: AC
  Administered 2017-12-10: 750 mg via INTRAVENOUS
  Filled 2017-12-10: qty 15

## 2017-12-10 NOTE — Progress Notes (Signed)
Patient tolerated iron infusion with no complaints voiced.  Good blood return noted before and after administration of iron.  No complaints of pain at site.  No bruising or swelling noted at site.  Band aid applied.  VSS with discharge and left ambulatory with no s/s of distress noted.

## 2017-12-10 NOTE — Patient Instructions (Signed)
[facility] at [facility]  Discharge Instructions:  You received an iron infusion today.  _______________________________________________________________  Thank you for choosing [facility] at [facility] to provide your oncology and hematology care.  To afford each patient quality time with our providers, please arrive at least 15 minutes before your scheduled appointment.  You need to re-schedule your appointment if you arrive 10 or more minutes late.  We strive to give you quality time with our providers, and arriving late affects you and other patients whose appointments are after yours.  Also, if you no show three or more times for appointments you may be dismissed from the clinic.  Again, thank you for choosing [facility] at [facility]. Our hope is that these requests will allow you access to exceptional care and in a timely manner. _______________________________________________________________  If you have questions after your visit, please contact our office at [phone] between the hours of 8:30 a.m. and 5:00 p.m. Voicemails left after 4:30 p.m. will not be returned until the following business day. _______________________________________________________________  For prescription refill requests, have your pharmacy contact our office. _______________________________________________________________  Recommendations made by the consultant and any test results will be sent to your referring physician. _______________________________________________________________ 

## 2018-01-14 ENCOUNTER — Telehealth: Payer: Self-pay | Admitting: *Deleted

## 2018-01-14 NOTE — Telephone Encounter (Signed)
Pt called and wanted to see if she still needed to get b12 injection. Last b12 checked in jan. States do weekly for 4 weeks then every 4 weeks. Pt was last in office on march 7th for b12 injection. She states she hurt her leg and she was not able to come in. She is now ready to start back. Transferred to front to schedule nurse visit.

## 2018-01-15 ENCOUNTER — Ambulatory Visit: Payer: Medicare Other

## 2018-01-15 NOTE — Telephone Encounter (Signed)
I do recommend that this patient do B12 shots monthly

## 2018-01-27 ENCOUNTER — Ambulatory Visit (INDEPENDENT_AMBULATORY_CARE_PROVIDER_SITE_OTHER): Payer: Medicare Other | Admitting: Family Medicine

## 2018-01-27 ENCOUNTER — Encounter: Payer: Self-pay | Admitting: Family Medicine

## 2018-01-27 ENCOUNTER — Ambulatory Visit (HOSPITAL_COMMUNITY)
Admission: RE | Admit: 2018-01-27 | Discharge: 2018-01-27 | Disposition: A | Discharge status: Home / Self Care | Payer: Medicare Other | Source: Ambulatory Visit | Attending: Family Medicine | Admitting: Family Medicine

## 2018-01-27 ENCOUNTER — Telehealth: Payer: Self-pay | Admitting: *Deleted

## 2018-01-27 VITALS — BP 138/84 | Ht 65.0 in | Wt 202.0 lb

## 2018-01-27 DIAGNOSIS — M25561 Pain in right knee: Secondary | ICD-10-CM | POA: Insufficient documentation

## 2018-01-27 DIAGNOSIS — E538 Deficiency of other specified B group vitamins: Secondary | ICD-10-CM | POA: Diagnosis not present

## 2018-01-27 MED ORDER — CYANOCOBALAMIN 1000 MCG/ML IJ SOLN
1000.0000 ug | Freq: Once | INTRAMUSCULAR | Status: AC
  Administered 2018-01-27: 1000 ug via INTRAMUSCULAR

## 2018-01-27 MED ORDER — METHYLPREDNISOLONE ACETATE 40 MG/ML IJ SUSP
40.0000 mg | Freq: Once | INTRAMUSCULAR | Status: AC
  Administered 2018-01-27: 40 mg via INTRAMUSCULAR

## 2018-01-27 NOTE — Progress Notes (Signed)
   Subjective:    Patient ID: [name], female    DOB: 08-23-1950, 67 y.o.   MRN: [id number]  HPI  Patient arrives with right knee pain for several weeks.  Pt notes pain in the knee,  Having challenges with right knee aching and uncomfortable  Not as bad lately due to using walker and recently on vacation  No major injury to right knee,  Pos pers hx of arthritis  Jut took tylenol   Patient experiences stress fracture of her left leg 51/this was followed by Dr. Kipp Laurence a walker and put more strain on her right leg during this time  Review of Systems No headache, no major weight loss or weight gain, no chest pain no back pain abdominal pain no change in bowel habits complete ROS otherwise negative     Objective:   Physical Exam  Alert vitals stable, NAD. Blood pressure good on repeat. HEENT normal. Lungs clear. Heart regular rate and rhythm. No joint line tenderness right knee no obvious effusion substantial anterior no laxity      Assessment & Plan:  Impression knee pain substantial.  With history of osteopenia and stress fracture prevention x-ray with duration.  Could well be inflammation from overuse and compensation discussed patient requests steroid injection rather than oral steroid taper

## 2018-01-27 NOTE — Telephone Encounter (Signed)
Pt in today for B12 injection. Pt wanted to know how long she was suppose to continue injections. Told pt I would ask dr Nicki Reaper and call her back. I looked in chart and a phone message on June 20th Dr. Nicki Reaper said to do monthly. Left message to return call to let pt know to continue monthly.

## 2018-01-27 NOTE — Telephone Encounter (Signed)
Pt returned call. Informed patient to do B12 injections monthly. Pt verbalized understanding.

## 2018-02-23 ENCOUNTER — Other Ambulatory Visit (HOSPITAL_COMMUNITY): Payer: Self-pay

## 2018-03-14 IMAGING — DX DG CHEST 2V
2 series · 2 of 2 positions shown · non-contrast
Comparison: Chest radiograph August 23, 2005

CLINICAL DATA: Chest heaviness and congestion. Recent viral
illness.

EXAM:
CHEST  2 VIEW

[chest pa]
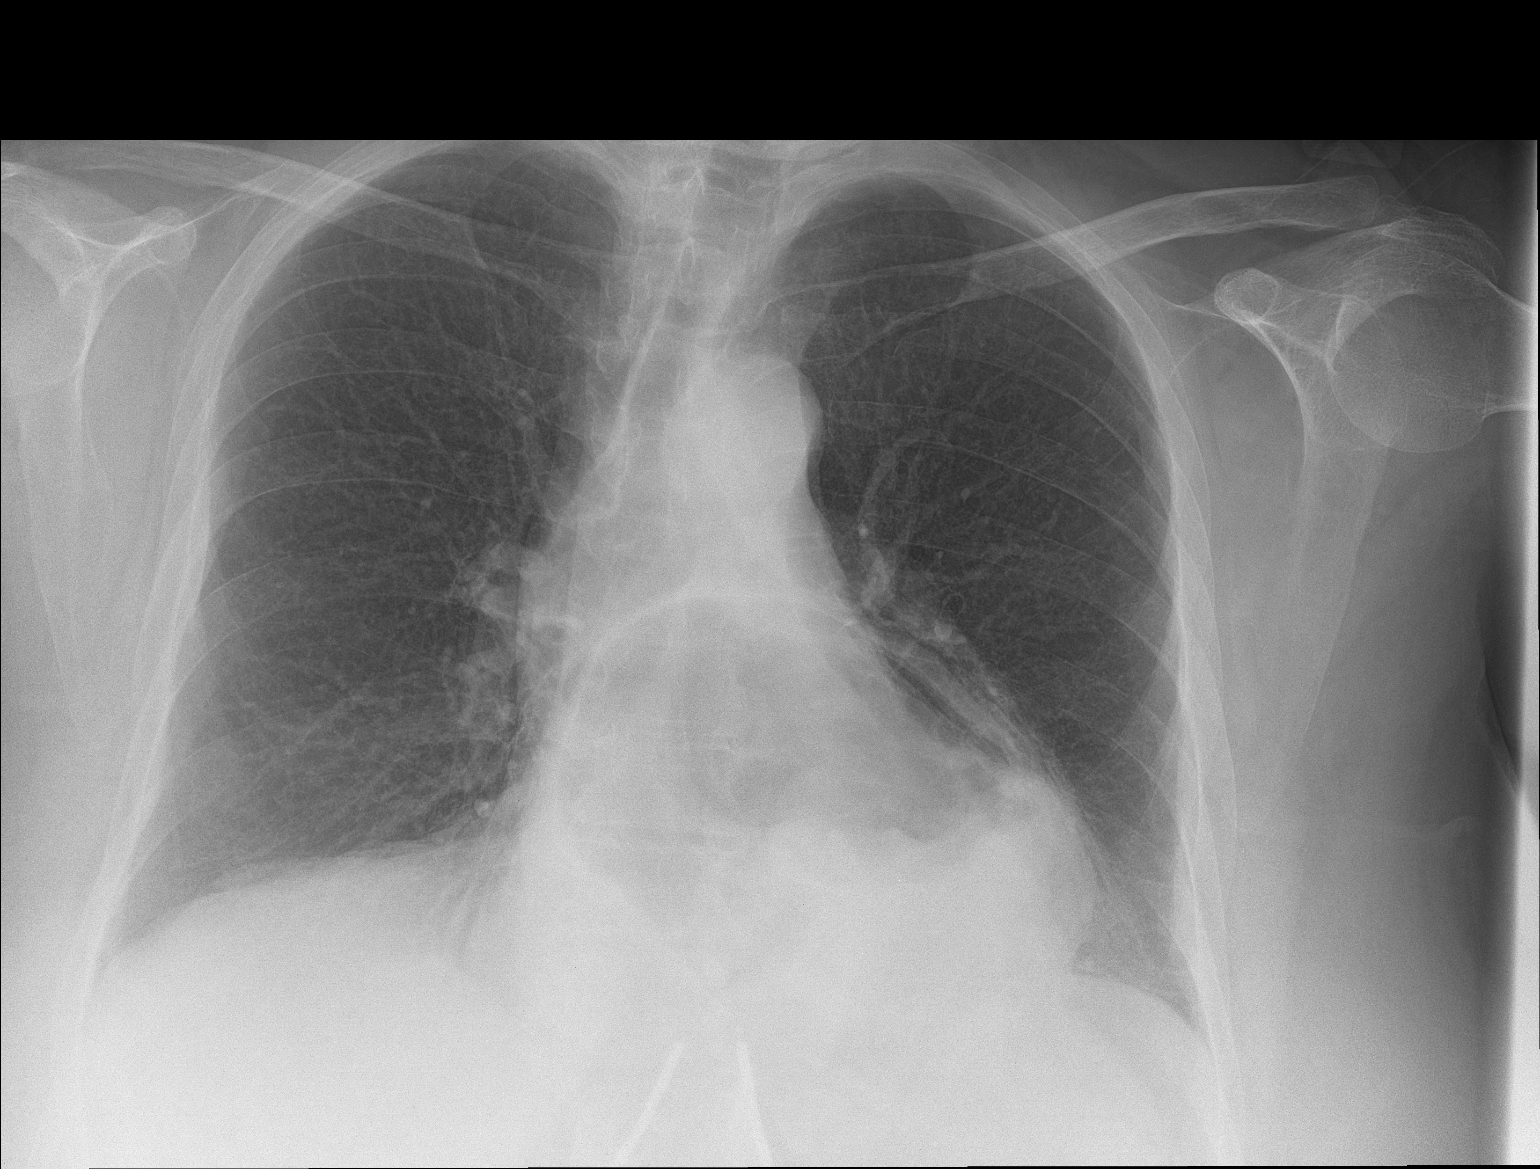

[chest lat]
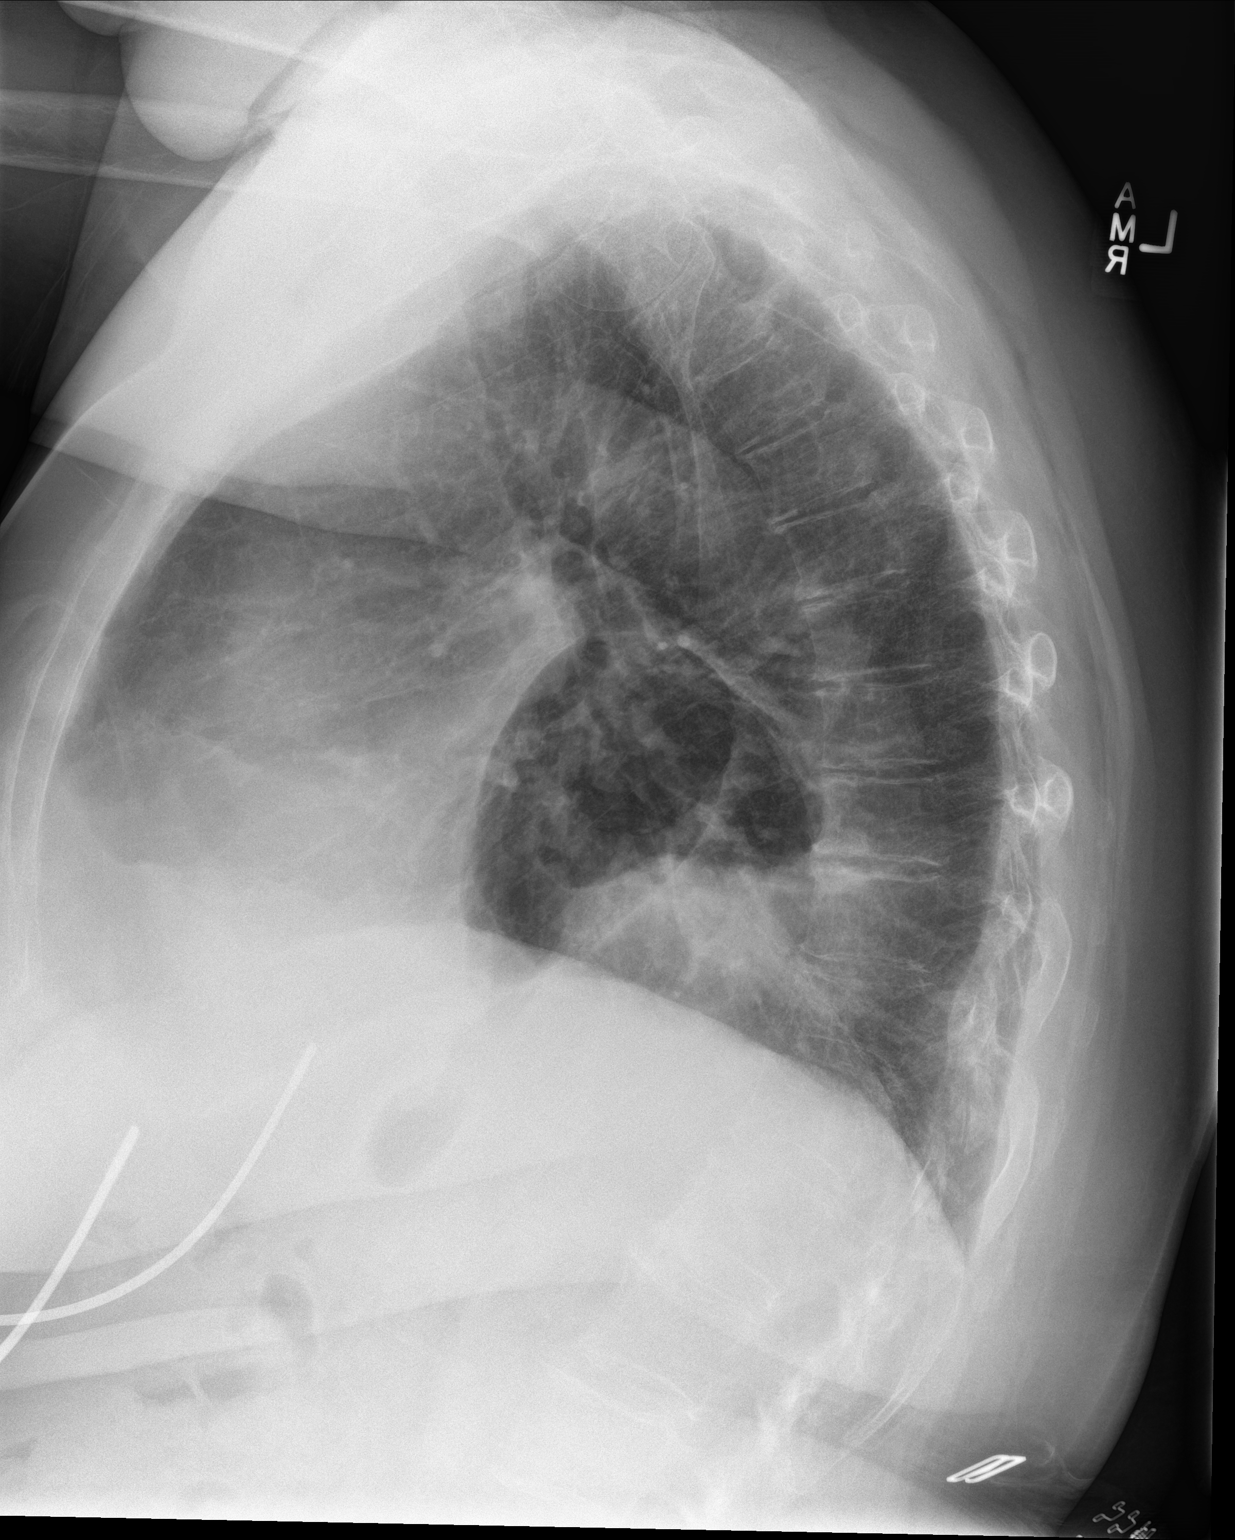

[2 of 2 positions shown; findings below may reference images not displayed]

FINDINGS: Cardiomediastinal silhouette is normal. No pleural effusions or
focal consolidations. Trachea projects midline and there is no
pneumothorax. Soft tissue planes and included osseous structures are
non-suspicious. Large partially air-filled hiatal hernia. Mild
degenerative change of the thoracic spine.
IMPRESSION: No acute cardiopulmonary process.

Large hiatal hernia.

## 2018-04-02 ENCOUNTER — Inpatient Hospital Stay (HOSPITAL_COMMUNITY): Payer: Medicare Other | Attending: Internal Medicine

## 2018-04-02 DIAGNOSIS — D500 Iron deficiency anemia secondary to blood loss (chronic): Secondary | ICD-10-CM

## 2018-04-02 DIAGNOSIS — D508 Other iron deficiency anemias: Secondary | ICD-10-CM | POA: Diagnosis not present

## 2018-04-02 DIAGNOSIS — E538 Deficiency of other specified B group vitamins: Secondary | ICD-10-CM | POA: Insufficient documentation

## 2018-04-02 DIAGNOSIS — K5090 Crohn's disease, unspecified, without complications: Secondary | ICD-10-CM | POA: Insufficient documentation

## 2018-04-02 LAB — COMPREHENSIVE METABOLIC PANEL
ALBUMIN: 3.7 g/dL (ref 3.5–5.0)
ALT: 12 U/L (ref 0–44)
AST: 12 U/L — AB (ref 15–41)
Alkaline Phosphatase: 97 U/L (ref 38–126)
Anion gap: 8 (ref 5–15)
BILIRUBIN TOTAL: 0.3 mg/dL (ref 0.3–1.2)
BUN: 21 mg/dL (ref 8–23)
CALCIUM: 8.8 mg/dL — AB (ref 8.9–10.3)
CO2: 22 mmol/L (ref 22–32)
Chloride: 108 mmol/L (ref 98–111)
Creatinine, Ser: 0.71 mg/dL (ref 0.44–1.00)
GFR calc Af Amer: >60 mL/min (ref >60)
GFR calc non Af Amer: >60 mL/min (ref >60)
Glucose, Bld: 105 mg/dL — ABNORMAL HIGH (ref 70–99)
POTASSIUM: 4.2 mmol/L (ref 3.5–5.1)
Sodium: 138 mmol/L (ref 135–145)
TOTAL PROTEIN: 7 g/dL (ref 6.5–8.1)

## 2018-04-02 LAB — CBC WITH DIFFERENTIAL/PLATELET
BASOS ABS: 0 10*3/uL (ref 0.0–0.1)
BASOS PCT: 1 %
Eosinophils Absolute: 0.1 10*3/uL (ref 0.0–0.7)
Eosinophils Relative: 1 %
HEMATOCRIT: 29.4 % — AB (ref 36.0–46.0)
Hemoglobin: 8.9 g/dL — ABNORMAL LOW (ref 12.0–15.0)
Lymphocytes Relative: 28 %
Lymphs Abs: 1.8 10*3/uL (ref 0.7–4.0)
MCH: 24.5 pg — ABNORMAL LOW (ref 26.0–34.0)
MCHC: 30.3 g/dL (ref 30.0–36.0)
MCV: 81 fL (ref 78.0–100.0)
MONO ABS: 0.6 10*3/uL (ref 0.1–1.0)
Monocytes Relative: 9 %
NEUTROS ABS: 4 10*3/uL (ref 1.7–7.7)
NEUTROS PCT: 61 %
Platelets: 252 10*3/uL (ref 150–400)
RBC: 3.63 MIL/uL — AB (ref 3.87–5.11)
RDW: 14 % (ref 11.5–15.5)
WBC: 6.6 10*3/uL (ref 4.0–10.5)

## 2018-04-02 LAB — FERRITIN: Ferritin: 7 ng/mL — ABNORMAL LOW (ref 11–307)

## 2018-04-02 LAB — LACTATE DEHYDROGENASE: LDH: 100 U/L (ref 98–192)

## 2018-04-03 DIAGNOSIS — A881 Epidemic vertigo: Secondary | ICD-10-CM | POA: Diagnosis not present

## 2018-04-08 ENCOUNTER — Encounter: Payer: Self-pay | Admitting: Family Medicine

## 2018-04-08 ENCOUNTER — Ambulatory Visit (HOSPITAL_COMMUNITY): Payer: Self-pay | Admitting: Internal Medicine

## 2018-04-08 ENCOUNTER — Ambulatory Visit (INDEPENDENT_AMBULATORY_CARE_PROVIDER_SITE_OTHER): Payer: Medicare Other | Admitting: Family Medicine

## 2018-04-08 VITALS — BP 124/76 | Temp 98.8°F | Ht 65.0 in | Wt 194.0 lb

## 2018-04-08 DIAGNOSIS — M546 Pain in thoracic spine: Secondary | ICD-10-CM

## 2018-04-08 DIAGNOSIS — Z1211 Encounter for screening for malignant neoplasm of colon: Secondary | ICD-10-CM | POA: Diagnosis not present

## 2018-04-08 DIAGNOSIS — M5414 Radiculopathy, thoracic region: Secondary | ICD-10-CM

## 2018-04-08 DIAGNOSIS — E538 Deficiency of other specified B group vitamins: Secondary | ICD-10-CM

## 2018-04-08 MED ORDER — CYANOCOBALAMIN 1000 MCG/ML IJ SOLN
1000.0000 ug | Freq: Once | INTRAMUSCULAR | Status: AC
  Administered 2018-04-08: 1000 ug via INTRAMUSCULAR

## 2018-04-08 MED ORDER — GABAPENTIN 100 MG PO CAPS: 100.0000 mg | ORAL_CAPSULE | Freq: Every day | ORAL | 3 refills | Status: DC

## 2018-04-08 NOTE — Progress Notes (Signed)
   Subjective:    Patient ID: [name], female    DOB: [birth year], 67 y.o.   MRN: [id number]  Dizziness  This is a new problem. Episode onset: sept 2nd. Pertinent negatives include no abdominal pain, chest pain, congestion, coughing, fatigue or weakness.  was having nausea and vomiting. tried meclizine that was prescribed at urgent care and it helped.  The vertigo is actually getting better she feels a little bit unsteady intermittently she denies any nausea vomiting states meclizine to help Went to the urgent care Denies double vision headaches  Mid back pain. Has always had back pain but worse the last few weeks.  Patient sustained a fall earlier this year has had mid back pain ever since then worse in the mid back radiates to the left side.  Numbness tingling with it.  Denies any weakness.  No difficulty breathing.  Would like b12 injection today.  Has pernicious anemia is instructed to be doing the B12 shots monthly but she forgets them at times  Declines flu vaccine.    Review of Systems  Constitutional: Negative for activity change, appetite change and fatigue.  HENT: Negative for congestion and rhinorrhea.   Respiratory: Negative for cough and shortness of breath.   Cardiovascular: Negative for chest pain and leg swelling.  Gastrointestinal: Negative for abdominal pain and diarrhea.  Endocrine: Negative for polydipsia and polyphagia.  Skin: Negative for color change.  Neurological: Positive for dizziness. Negative for weakness.  Psychiatric/Behavioral: Negative for behavioral problems and confusion.       Objective:   Physical Exam  Constitutional: She appears well-nourished. No distress.  HENT:  Head: Normocephalic and atraumatic.  Eyes: Right eye exhibits no discharge. Left eye exhibits no discharge.  Neck: No tracheal deviation present.  Cardiovascular: Normal rate, regular rhythm and normal heart sounds.  No murmur heard. Pulmonary/Chest: Effort normal and breath  sounds normal. No respiratory distress.  Musculoskeletal: She exhibits no edema.  Lymphadenopathy:    She has no cervical adenopathy.  Neurological: She is alert. Coordination normal.  Skin: Skin is warm and dry.  Psychiatric: She has a normal mood and affect. Her behavior is normal.  Vitals reviewed.   Finger-to-nose normal Romberg negative no nystagmus is able to walk without ataxia  Mid back pain radiates to the left side no obvious deformity on exam this is been going on for several months patient states the pain is getting worse causing it very difficult for her to do her work to do job at home and do activity at home has tried Tylenol and range of motion exercises without help      Assessment & Plan:  Patient is due for colonoscopy  Mid back pain recommend x-rays patient will probably need to have an MRI I believe the patient has an impingement of the nerve this been going on for 5 months now because it is not getting any better consideration for MRI await the results of the x-ray Gabapentin 100 mg 1 at bedtime  Iron deficient anemia referral for the colonoscopy with Dr. Oneida Alar  B12 injection today  Vertigo should gradually resolve Has iron deficient anemia followed by hematology I told the patient very important to get her colonoscopy She defers currently We will send message to Dr. fields hopefully they can get her to come in for colonoscopy  Vertigo

## 2018-04-10 ENCOUNTER — Ambulatory Visit (HOSPITAL_COMMUNITY)
Admission: RE | Admit: 2018-04-10 | Discharge: 2018-04-10 | Disposition: A | Discharge status: Home / Self Care | Payer: Medicare Other | Source: Ambulatory Visit | Attending: Family Medicine | Admitting: Family Medicine

## 2018-04-10 DIAGNOSIS — M546 Pain in thoracic spine: Secondary | ICD-10-CM | POA: Insufficient documentation

## 2018-04-10 DIAGNOSIS — M549 Dorsalgia, unspecified: Secondary | ICD-10-CM | POA: Diagnosis not present

## 2018-04-14 ENCOUNTER — Encounter: Payer: Self-pay | Admitting: Family Medicine

## 2018-04-16 ENCOUNTER — Inpatient Hospital Stay (HOSPITAL_COMMUNITY): Payer: Medicare Other

## 2018-04-16 ENCOUNTER — Other Ambulatory Visit: Payer: Self-pay

## 2018-04-16 ENCOUNTER — Inpatient Hospital Stay (HOSPITAL_BASED_OUTPATIENT_CLINIC_OR_DEPARTMENT_OTHER): Payer: Medicare Other | Admitting: Internal Medicine

## 2018-04-16 ENCOUNTER — Encounter (HOSPITAL_COMMUNITY): Payer: Self-pay | Admitting: Internal Medicine

## 2018-04-16 VITALS — BP 115/46 | HR 66 | Temp 98.6°F | Resp 16

## 2018-04-16 VITALS — BP 141/57 | HR 65 | Temp 98.0°F | Resp 16 | Wt 192.1 lb

## 2018-04-16 DIAGNOSIS — K5090 Crohn's disease, unspecified, without complications: Secondary | ICD-10-CM | POA: Diagnosis not present

## 2018-04-16 DIAGNOSIS — E538 Deficiency of other specified B group vitamins: Secondary | ICD-10-CM | POA: Diagnosis not present

## 2018-04-16 DIAGNOSIS — D508 Other iron deficiency anemias: Secondary | ICD-10-CM

## 2018-04-16 DIAGNOSIS — D500 Iron deficiency anemia secondary to blood loss (chronic): Secondary | ICD-10-CM

## 2018-04-16 MED ORDER — SODIUM CHLORIDE 0.9 % IV SOLN
INTRAVENOUS | Status: DC
  Administered 2018-04-16: 16:00:00 via INTRAVENOUS

## 2018-04-16 MED ORDER — SODIUM CHLORIDE 0.9 % IV SOLN
750.0000 mg | Freq: Once | INTRAVENOUS | Status: AC
  Administered 2018-04-16: 750 mg via INTRAVENOUS
  Filled 2018-04-16: qty 15

## 2018-04-16 NOTE — Addendum Note (Signed)
Addended by: Dairl Ponder on: 04/16/2018 09:20 AM   Modules accepted: Orders

## 2018-04-16 NOTE — Progress Notes (Signed)
Iron given per orders. Patient tolerated it well without problems. Vitals stable and discharged home from clinic ambulatory. Follow up as scheduled.  

## 2018-04-16 NOTE — Progress Notes (Signed)
Diagnosis Iron deficiency anemia due to chronic blood loss - Plan: CBC with Differential/Platelet, Comprehensive metabolic panel, Lactate dehydrogenase, Ferritin, DISCONTINUED: ferric carboxymaltose (INJECTAFER) 750 mg in sodium chloride 0.9 % 250 mL IVPB  Staging Cancer Staging No matching staging information was found for the patient.  Assessment and Plan:  1.  Iron deficiency anemia.  Pt was previously followed by Dr. Whitney Muse and was felt to have anemia due to chronic blood loss and malabsorption, requiring intermittent IV iron.  Labs done 04/02/2018 reviewed and showed WBC 6.6 HB 8.9 plts 252,000.  Chemistries WNL with K+ 4.2 Cr 0.71 and normal LFTs.  Ferritin decreased at 7.    Pt was last treated with IV iron 11/2017.  She will be treated with Injectafer 750 mg IV D1 and D8.  She will have repeat labs after IV iron in 04/2018.  Pt is advised to follow-up with GI as last colonoscopy was in 2011.    2.  B12 deficiency.  Pt now on PO B12 replacement therapy.  3.  Chron's disease.  Follow-up with GI especially due to recurrent IDA.    4.  Health maintenance.  GI and mammogram evaluation as recommended.    30 minutes spent with more than 50% spent in counseling and coordination of care.     Current Status:  Pt is seen today for follow-up to go over recent labs.  She denies any blood in stool or urine.    Problem List Patient Active Problem List   Diagnosis Date Noted  . Closed fracture of proximal end of left fibula 10/20/17  [S82.832A] 12/02/2017  . Thyroid nodule [E04.1] 09/23/2017  . Edema of left lower extremity [R60.0] 08/25/2017  . [place] headache [G44.53] 09/06/2015  . Vision changes [H53.9] 09/06/2015  . Dizziness [R42] 09/06/2015  . New onset of headaches after age 26 [R51] 09/06/2015  . B12 deficiency [E53.8] 07/09/2015  . Acute bilateral lower abdominal pain [R10.31, R10.32] 12/08/2014  . Osteoporosis [M81.0] 11/20/2014  . Chronic back pain [M54.9, G89.29]  11/10/2014  . GERD (gastroesophageal reflux disease) [K21.9] 06/16/2014  . Impaired fasting glucose [R73.01] 03/29/2014  . Fibromyalgia [M79.7] 12/09/2013  . Crohn's ileitis ([place]) [W09.81] 01/17/2011  . Iron deficiency anemia [D50.9] 04/10/2010    Past Medical History Past Medical History:  Diagnosis Date  . Arthritis   . B12 deficiency 07/09/2015  . Crohn disease ([place]) JULY 2011 FEDA   PERSISTENT TI ULCERS seen on CE, NO NSAID USE  . Endometriosis    HISTORY  LEADING TO HYSTERECTOMY  . Fibromyalgia   . Fibromyalgia 12/09/2013  . Gastric ulcer   . GERD (gastroesophageal reflux disease)   . Headache   . Helicobacter pylori gastritis    HISTORY  . History of hiatal hernia   . Hives   . Iron (Fe) deficiency anemia 2009   JAN 2012 NL HB & FERRITIN  . Obesity   . Osteoporosis 11/20/2014  . Plantar fasciitis     Past Surgical History Past Surgical History:  Procedure Laterality Date  . ABDOMINAL HYSTERECTOMY    . CATARACT EXTRACTION W/PHACO Right 12/05/2015   Procedure: CATARACT EXTRACTION PHACO AND INTRAOCULAR LENS PLACEMENT (IOC);  Surgeon: Birder Robson, MD;  Location: [facility] ORS;  Service: Ophthalmology;  Laterality: Right;  [place] 00:59.9 AP% 21.9 CDE 13.10 fluid pack lot # [id number]  . CATARACT EXTRACTION W/PHACO Left 06/24/2016   Procedure: CATARACT EXTRACTION PHACO AND INTRAOCULAR LENS PLACEMENT LEFT EYE; CDE:  8.33;  Surgeon: Tonny Branch, MD;  Location: [facility];  Service: Ophthalmology;  Laterality: Left;  . COLONOSCOPY    . ESOPHAGOGASTRODUODENOSCOPY  10/14/2007   Esophagus showed 2 cm pink tongue seen extending from the GE junction.  Biopsies obtained via cold forceps.  Otherwise the  esophagus was without erosions, mass, ulceration, or stricture / . Large hiatal hernia pouch with linear erosions, and two 3-6 mmulcers seen in the pouch.  Biopsies obtained via cold forceps to  evaluate for H. pylori gastritis or malignancy  . Ileocolonoscopy  10/14/2007   Multiple 1-3 mm  terminal ileum ulcers biopsied  The ulcers seemed to only involve the last 5 cm of her terminal ileum, and 10 cm of her ileum was visualized.  Otherwise the colon  was without polyps, masses, diverticula, or AVMs.  She had a normal retroflexed view of the rectum  . OVARIAN CYST REMOVAL    . TOTAL ABDOMINAL HYSTERECTOMY W/ BILATERAL SALPINGOOPHORECTOMY    . UPPER GASTROINTESTINAL ENDOSCOPY  JULY 2011 DIARRHEA/FEDA    Bx-H.PYLORI GASTRITIS, NL DUODENUM    Family History Family History  Problem Relation Age of Onset  . Diabetes Mother   . Cancer Father   . Diabetes Father   . Diabetes Sister   . Diabetes Brother   . Colon polyps Neg Hx   . Colon cancer Neg Hx   . Stroke Neg Hx      Social History  reports that she has never smoked. She has never used smokeless tobacco. She reports that she does not drink alcohol or use drugs.  Medications  Current Outpatient Medications:  .  acetaminophen (TYLENOL) 325 MG tablet, Take 650 mg by mouth as needed for moderate pain or headache. , Disp: , Rfl:  .  meclizine (ANTIVERT) 25 MG tablet, Take 25 mg by mouth 3 (three) times daily as needed., Disp: , Rfl: 0 .  omeprazole (PRILOSEC) 20 MG capsule, TAKE 1 CAPSULE (20 MG TOTAL) BY MOUTH DAILY., Disp: 31 capsule, Rfl: 11 .  SUMAtriptan (IMITREX) 50 MG tablet, TAKE 1 TABLET BY MOUTH EVERY 2 HOURS AS NEEDED, Disp: , Rfl: 0  Allergies Cefzil [cefprozil]; Celebrex [celecoxib]; and Sulfonamide derivatives  Review of Systems Review of Systems - Oncology ROS negative other than fatigue.     Physical Exam  Vitals Wt Readings from Last 3 Encounters:  04/16/18 192 lb 1.6 oz (87.1 kg)  04/08/18 194 lb (88 kg)  01/27/18 202 lb (91.6 kg)   Temp Readings from Last 3 Encounters:  04/16/18 98 F (36.7 C) (Oral)  04/16/18 98.6 F (37 C) (Oral)  04/08/18 98.8 F (37.1 C) (Oral)   BP Readings from Last 3 Encounters:  04/16/18 (!) 141/57  04/16/18 (!) 115/46  04/08/18 124/76   Pulse Readings from  Last 3 Encounters:  04/16/18 65  04/16/18 66  12/10/17 62   Constitutional: Well-developed, well-nourished, and in no distress.   HENT: Head: Normocephalic and atraumatic.  Mouth/Throat: No oropharyngeal exudate. Mucosa moist. Eyes: Pupils are equal, round, and reactive to light. Conjunctivae are normal. No scleral icterus.  Neck: Normal range of motion. Neck supple. No JVD present.  Cardiovascular: Normal rate, regular rhythm and normal heart sounds.  Exam reveals no gallop and no friction rub.   No murmur heard. Pulmonary/Chest: Effort normal and breath sounds normal. No respiratory distress. No wheezes.No rales.  Abdominal: Soft. Bowel sounds are normal. No distension. There is no tenderness. There is no guarding.  Musculoskeletal: No edema or tenderness.  Lymphadenopathy: No cervical or supraclavicular adenopathy.  Neurological: Alert and oriented to  person, place, and time. No cranial nerve deficit.  Skin: Skin is warm and dry. No rash noted. No erythema. No pallor.  Psychiatric: Affect and judgment normal.   Labs No visits with results within 3 Day(s) from this visit.  Latest known visit with results is:  Appointment on 04/02/2018  Component Date Value Ref Range Status  . WBC 04/02/2018 6.6  4.0 - 10.5 K/uL Final  . RBC 04/02/2018 3.63* 3.87 - 5.11 MIL/uL Final  . Hemoglobin 04/02/2018 8.9* 12.0 - 15.0 g/dL Final  . HCT 04/02/2018 29.4* 36.0 - 46.0 % Final  . MCV 04/02/2018 81.0  78.0 - 100.0 fL Final  . MCH 04/02/2018 24.5* 26.0 - 34.0 pg Final  . MCHC 04/02/2018 30.3  30.0 - 36.0 g/dL Final  . RDW 04/02/2018 14.0  11.5 - 15.5 % Final  . Platelets 04/02/2018 252  150 - 400 K/uL Final  . Neutrophils Relative % 04/02/2018 61  % Final  . Neutro Abs 04/02/2018 4.0  1.7 - 7.7 K/uL Final  . Lymphocytes Relative 04/02/2018 28  % Final  . Lymphs Abs 04/02/2018 1.8  0.7 - 4.0 K/uL Final  . Monocytes Relative 04/02/2018 9  % Final  . Monocytes Absolute 04/02/2018 0.6  0.1 - 1.0  K/uL Final  . Eosinophils Relative 04/02/2018 1  % Final  . Eosinophils Absolute 04/02/2018 0.1  0.0 - 0.7 K/uL Final  . Basophils Relative 04/02/2018 1  % Final  . Basophils Absolute 04/02/2018 0.0  0.0 - 0.1 K/uL Final   Performed at [facility], [address], [place], [place] [address]  . Sodium 04/02/2018 138  135 - 145 mmol/L Final  . Potassium 04/02/2018 4.2  3.5 - 5.1 mmol/L Final  . Chloride 04/02/2018 108  98 - 111 mmol/L Final  . CO2 04/02/2018 22  22 - 32 mmol/L Final  . Glucose, Bld 04/02/2018 105* 70 - 99 mg/dL Final  . BUN 04/02/2018 21  8 - 23 mg/dL Final  . Creatinine, Ser 04/02/2018 0.71  0.44 - 1.00 mg/dL Final  . Calcium 04/02/2018 8.8* 8.9 - 10.3 mg/dL Final  . Total Protein 04/02/2018 7.0  6.5 - 8.1 g/dL Final  . Albumin 04/02/2018 3.7  3.5 - 5.0 g/dL Final  . AST 04/02/2018 12* 15 - 41 U/L Final  . ALT 04/02/2018 12  0 - 44 U/L Final  . Alkaline Phosphatase 04/02/2018 97  38 - 126 U/L Final  . Total Bilirubin 04/02/2018 0.3  0.3 - 1.2 mg/dL Final  . GFR calc non Af Amer 04/02/2018 >60  >60 mL/min Final  . GFR calc Af Amer 04/02/2018 >60  >60 mL/min Final   Comment: (NOTE) The eGFR has been calculated using the CKD EPI equation. This calculation has not been validated in all clinical situations. eGFR's persistently <60 mL/min signify possible Chronic Kidney Disease.   Georgiann Hahn gap 04/02/2018 8  5 - 15 Final   Performed at [facility], [address], [place], [place] [address]  . Ferritin 04/02/2018 7* 11 - 307 ng/mL Final   Performed at [facility], [address] [place], [place] [address]  . LDH 04/02/2018 100  98 - 192 U/L Final   Performed at [facility], [address], [place],  [address]     Pathology Orders Placed This Encounter  Procedures  . CBC with Differential/Platelet    Standing Status:   Future    Standing Expiration Date:   04/17/2019  . Comprehensive metabolic panel  Standing Status:   Future    Standing Expiration Date:    04/17/2019  . Lactate dehydrogenase    Standing Status:   Future    Standing Expiration Date:   04/17/2019  . Ferritin    Standing Status:   Future    Standing Expiration Date:   04/17/2019       Zoila Shutter MD

## 2018-04-23 ENCOUNTER — Encounter: Payer: Self-pay | Admitting: Family Medicine

## 2018-04-28 ENCOUNTER — Inpatient Hospital Stay (HOSPITAL_COMMUNITY): Payer: Medicare Other | Attending: Hematology

## 2018-04-28 ENCOUNTER — Ambulatory Visit (HOSPITAL_COMMUNITY): Payer: Self-pay

## 2018-04-28 ENCOUNTER — Encounter (HOSPITAL_COMMUNITY): Payer: Self-pay

## 2018-04-28 ENCOUNTER — Telehealth: Payer: Self-pay | Admitting: Family Medicine

## 2018-04-28 VITALS — BP 126/46 | HR 72 | Temp 98.6°F | Resp 18

## 2018-04-28 DIAGNOSIS — E538 Deficiency of other specified B group vitamins: Secondary | ICD-10-CM | POA: Diagnosis not present

## 2018-04-28 DIAGNOSIS — Z79899 Other long term (current) drug therapy: Secondary | ICD-10-CM | POA: Diagnosis not present

## 2018-04-28 DIAGNOSIS — K5090 Crohn's disease, unspecified, without complications: Secondary | ICD-10-CM | POA: Insufficient documentation

## 2018-04-28 DIAGNOSIS — D500 Iron deficiency anemia secondary to blood loss (chronic): Secondary | ICD-10-CM

## 2018-04-28 DIAGNOSIS — D508 Other iron deficiency anemias: Secondary | ICD-10-CM | POA: Diagnosis not present

## 2018-04-28 IMAGING — DX DG TIBIA/FIBULA 2V*L*
3 series · 3 of 3 positions shown · non-contrast
Comparison: None.

CLINICAL DATA: Left lower leg pain after a fall at work couple of
weeks ago.

EXAM:
LEFT TIBIA AND FIBULA - 2 VIEW

[tibia ap]
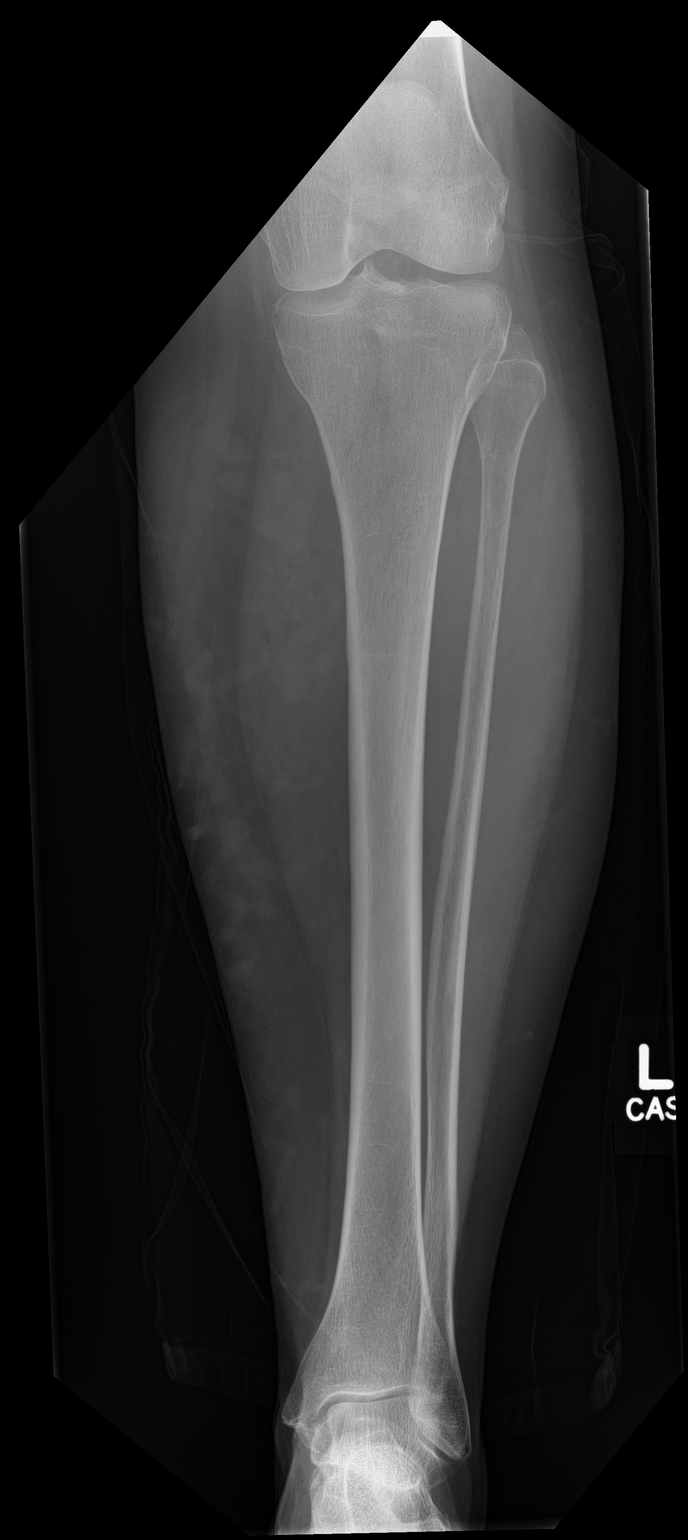

[tibia lat (1 of 2)]
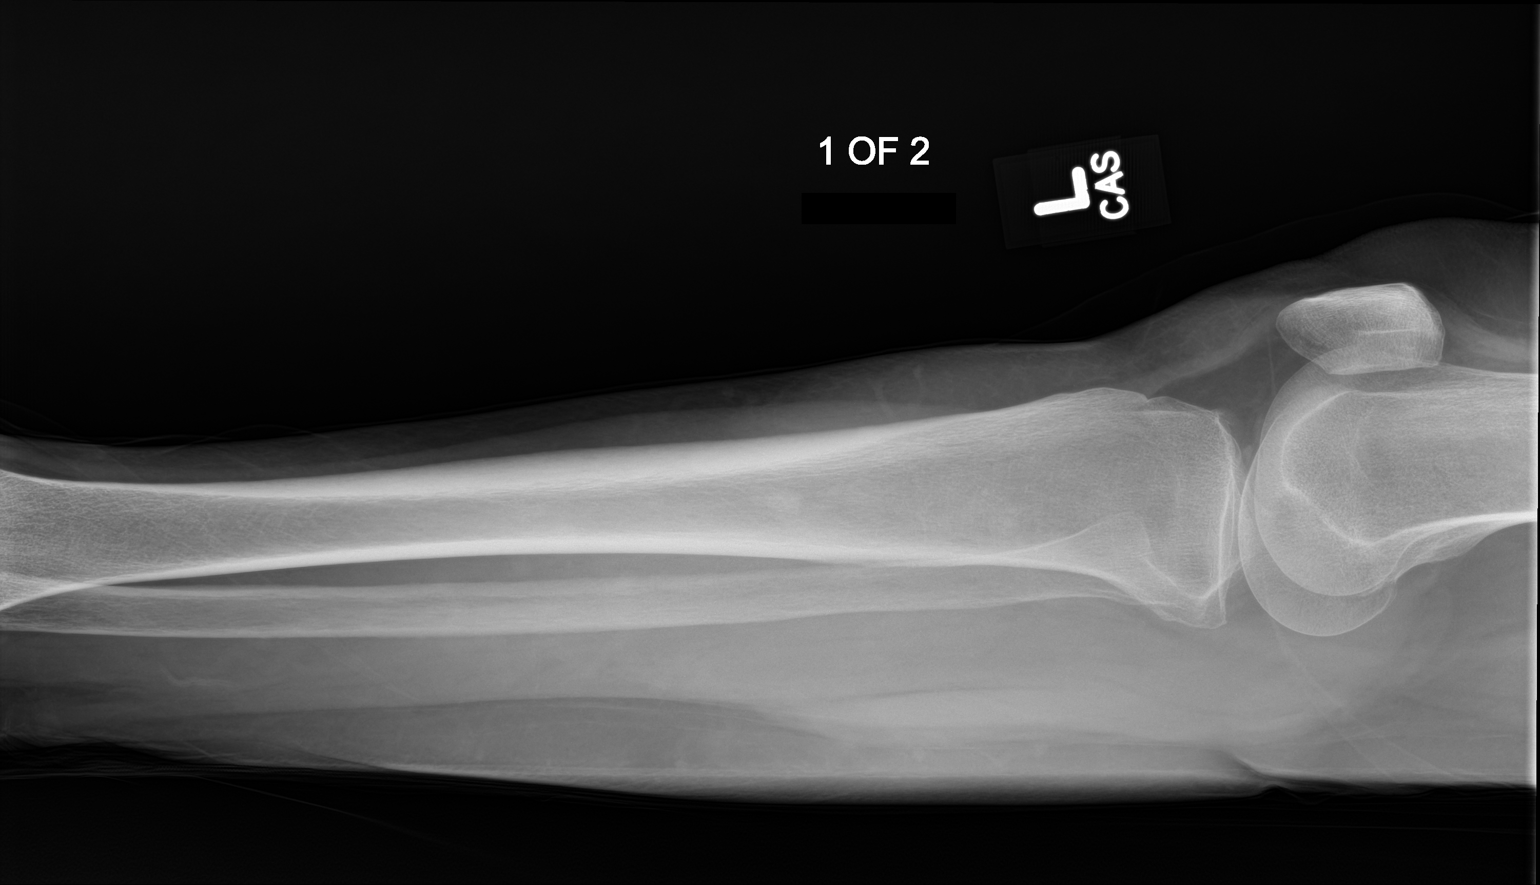

[tibia lat (2 of 2)]
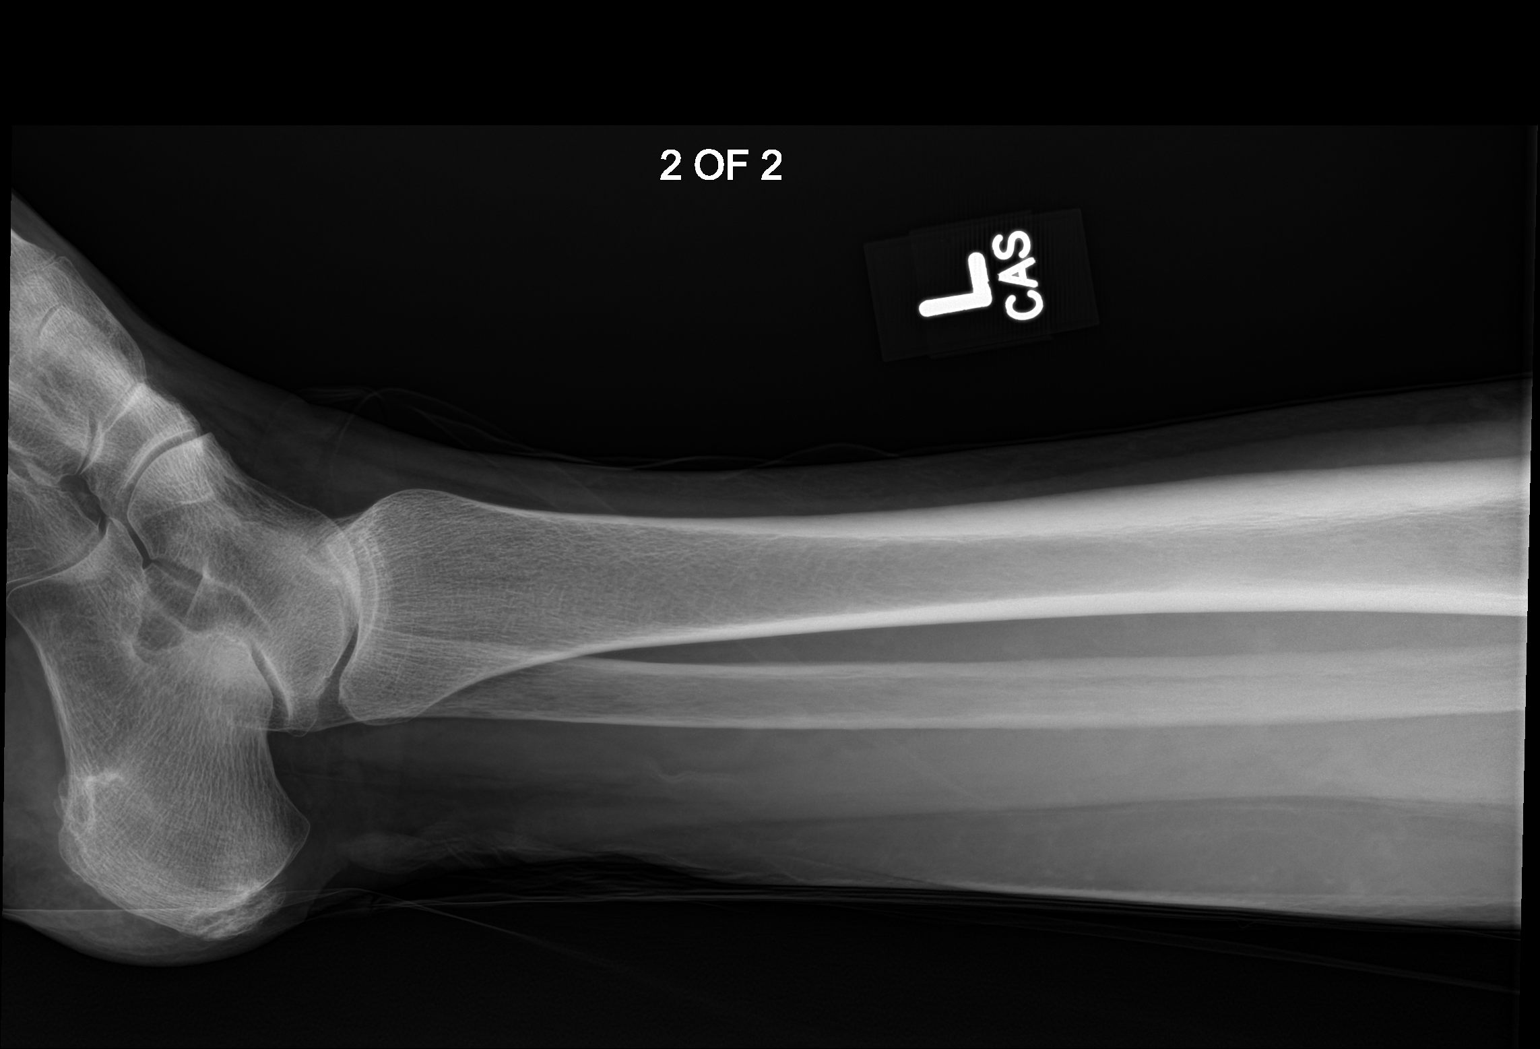

[3 of 3 positions shown; findings below may reference images not displayed]

FINDINGS: Subtle linear lucency in the proximal fibular metaphysis with subtle
possible area of cortical disruption raises the question of a
nondisplaced proximal fibula fracture. No evidence for associated
tibia fracture. No suspicious lytic or sclerotic osseous
abnormality.
IMPRESSION: Question nondisplaced fracture of the proximal fibular metaphysis.
Correlation for point tenderness in this region recommended.

## 2018-04-28 MED ORDER — SODIUM CHLORIDE 0.9 % IV SOLN
750.0000 mg | Freq: Once | INTRAVENOUS | Status: AC
  Administered 2018-04-28: 750 mg via INTRAVENOUS
  Filled 2018-04-28: qty 15

## 2018-04-28 MED ORDER — SODIUM CHLORIDE 0.9 % IV SOLN
INTRAVENOUS | Status: DC
  Administered 2018-04-28: 13:00:00 via INTRAVENOUS

## 2018-04-28 NOTE — Progress Notes (Signed)
Patient tolerated iron infusion with no complaints voiced.  Peripheral IV site with good blood return noted before and after administration of iron.  Site clean and dry with band aid applied.  Patient discharged in stable condition with no s/s of distress noted.

## 2018-04-28 NOTE — Telephone Encounter (Signed)
[facility] - need to let pt know that the ortho office has been unable to reach her to schedule & give her their # so she can return their call to get her appointment scheduled  [facility] ([place]) [phone]

## 2018-04-28 NOTE — Patient Instructions (Signed)
[facility]  Discharge Instructions:  You received an iron infusion today.  _______________________________________________________________  Thank you for choosing [facility] at [facility] to provide your oncology and hematology care.  To afford each patient quality time with our providers, please arrive at least 15 minutes before your scheduled appointment.  You need to re-schedule your appointment if you arrive 10 or more minutes late.  We strive to give you quality time with our providers, and arriving late affects you and other patients whose appointments are after yours.  Also, if you no show three or more times for appointments you may be dismissed from the clinic.  Again, thank you for choosing [facility] at [facility]. Our hope is that these requests will allow you access to exceptional care and in a timely manner. _______________________________________________________________  If you have questions after your visit, please contact our office at [phone] between the hours of 8:30 a.m. and 5:00 p.m. Voicemails left after 4:30 p.m. will not be returned until the following business day. _______________________________________________________________  For prescription refill requests, have your pharmacy contact our office. _______________________________________________________________  Recommendations made by the consultant and any test results will be sent to your referring physician. _______________________________________________________________ 

## 2018-04-29 ENCOUNTER — Encounter: Payer: Self-pay | Admitting: Gastroenterology

## 2018-05-05 ENCOUNTER — Encounter: Payer: Self-pay | Admitting: Family Medicine

## 2018-05-05 NOTE — Telephone Encounter (Signed)
Mailed "referral response needed" letter to pt

## 2018-05-26 ENCOUNTER — Ambulatory Visit (HOSPITAL_COMMUNITY): Payer: Self-pay | Admitting: Internal Medicine

## 2018-05-26 ENCOUNTER — Other Ambulatory Visit (HOSPITAL_COMMUNITY): Payer: Self-pay

## 2018-05-27 ENCOUNTER — Ambulatory Visit (HOSPITAL_COMMUNITY): Payer: Self-pay | Admitting: Internal Medicine

## 2018-05-27 ENCOUNTER — Inpatient Hospital Stay (HOSPITAL_COMMUNITY): Payer: Medicare Other

## 2018-05-27 DIAGNOSIS — K5090 Crohn's disease, unspecified, without complications: Secondary | ICD-10-CM | POA: Diagnosis not present

## 2018-05-27 DIAGNOSIS — E538 Deficiency of other specified B group vitamins: Secondary | ICD-10-CM | POA: Diagnosis not present

## 2018-05-27 DIAGNOSIS — D500 Iron deficiency anemia secondary to blood loss (chronic): Secondary | ICD-10-CM

## 2018-05-27 DIAGNOSIS — Z79899 Other long term (current) drug therapy: Secondary | ICD-10-CM | POA: Diagnosis not present

## 2018-05-27 DIAGNOSIS — D508 Other iron deficiency anemias: Secondary | ICD-10-CM | POA: Diagnosis not present

## 2018-05-27 LAB — COMPREHENSIVE METABOLIC PANEL
ALBUMIN: 3.9 g/dL (ref 3.5–5.0)
ALK PHOS: 110 U/L (ref 38–126)
ALT: 15 U/L (ref 0–44)
AST: 14 U/L — ABNORMAL LOW (ref 15–41)
Anion gap: 8 (ref 5–15)
BUN: 16 mg/dL (ref 8–23)
CALCIUM: 8.6 mg/dL — AB (ref 8.9–10.3)
CO2: 22 mmol/L (ref 22–32)
CREATININE: 0.61 mg/dL (ref 0.44–1.00)
Chloride: 107 mmol/L (ref 98–111)
GFR calc Af Amer: >60 mL/min (ref >60)
GFR calc non Af Amer: >60 mL/min (ref >60)
GLUCOSE: 103 mg/dL — AB (ref 70–99)
Potassium: 3.8 mmol/L (ref 3.5–5.1)
SODIUM: 137 mmol/L (ref 135–145)
Total Bilirubin: 0.3 mg/dL (ref 0.3–1.2)
Total Protein: 7.2 g/dL (ref 6.5–8.1)

## 2018-05-27 LAB — CBC WITH DIFFERENTIAL/PLATELET
ABS IMMATURE GRANULOCYTES: 0.02 10*3/uL (ref 0.00–0.07)
BASOS PCT: 1 %
Basophils Absolute: 0 10*3/uL (ref 0.0–0.1)
Eosinophils Absolute: 0.1 10*3/uL (ref 0.0–0.5)
Eosinophils Relative: 1 %
HCT: 38.3 % (ref 36.0–46.0)
HEMOGLOBIN: 11.8 g/dL — AB (ref 12.0–15.0)
Immature Granulocytes: 0 %
LYMPHS ABS: 1.6 10*3/uL (ref 0.7–4.0)
Lymphocytes Relative: 27 %
MCH: 26.1 pg (ref 26.0–34.0)
MCHC: 30.8 g/dL (ref 30.0–36.0)
MCV: 84.7 fL (ref 80.0–100.0)
MONO ABS: 0.5 10*3/uL (ref 0.1–1.0)
MONOS PCT: 8 %
Neutro Abs: 3.7 10*3/uL (ref 1.7–7.7)
Neutrophils Relative %: 63 %
Platelets: 226 10*3/uL (ref 150–400)
RBC: 4.52 MIL/uL (ref 3.87–5.11)
RDW: 19.6 % — ABNORMAL HIGH (ref 11.5–15.5)
WBC: 5.8 10*3/uL (ref 4.0–10.5)
nRBC: 0 % (ref 0.0–0.2)

## 2018-05-27 LAB — LACTATE DEHYDROGENASE: LDH: 113 U/L (ref 98–192)

## 2018-05-27 LAB — FERRITIN: Ferritin: 89 ng/mL (ref 11–307)

## 2018-05-28 ENCOUNTER — Other Ambulatory Visit (HOSPITAL_COMMUNITY): Payer: Self-pay | Admitting: *Deleted

## 2018-05-28 NOTE — Progress Notes (Signed)
Lab values left on patient's cell phone. I asked patient to call us back if there were any questions.

## 2018-06-16 DIAGNOSIS — M542 Cervicalgia: Secondary | ICD-10-CM | POA: Diagnosis not present

## 2018-06-16 DIAGNOSIS — M545 Low back pain: Secondary | ICD-10-CM | POA: Diagnosis not present

## 2018-06-16 DIAGNOSIS — M546 Pain in thoracic spine: Secondary | ICD-10-CM | POA: Diagnosis not present

## 2018-06-16 DIAGNOSIS — G8929 Other chronic pain: Secondary | ICD-10-CM | POA: Insufficient documentation

## 2018-06-16 DIAGNOSIS — K5090 Crohn's disease, unspecified, without complications: Secondary | ICD-10-CM | POA: Insufficient documentation

## 2018-06-20 ENCOUNTER — Emergency Department
Admission: EM | Admit: 2018-06-20 | Discharge: 2018-06-20 | Disposition: A | Discharge status: Home / Self Care | Payer: Medicare Other | Attending: Emergency Medicine | Admitting: Emergency Medicine

## 2018-06-20 ENCOUNTER — Other Ambulatory Visit: Payer: Self-pay

## 2018-06-20 ENCOUNTER — Encounter: Payer: Self-pay | Admitting: Emergency Medicine

## 2018-06-20 DIAGNOSIS — N3000 Acute cystitis without hematuria: Secondary | ICD-10-CM | POA: Diagnosis not present

## 2018-06-20 DIAGNOSIS — Z79899 Other long term (current) drug therapy: Secondary | ICD-10-CM | POA: Diagnosis not present

## 2018-06-20 DIAGNOSIS — R300 Dysuria: Secondary | ICD-10-CM | POA: Diagnosis present

## 2018-06-20 LAB — URINALYSIS, COMPLETE (UACMP) WITH MICROSCOPIC
BILIRUBIN URINE: NEGATIVE
Glucose, UA: NEGATIVE mg/dL
Ketones, ur: NEGATIVE mg/dL
NITRITE: NEGATIVE
PROTEIN: 100 mg/dL — AB
RBC / HPF: >50 RBC/hpf — ABNORMAL HIGH (ref 0–5)
SPECIFIC GRAVITY, URINE: 1.025 (ref 1.005–1.030)
WBC, UA: >50 WBC/hpf — ABNORMAL HIGH (ref 0–5)
pH: 6 (ref 5.0–8.0)

## 2018-06-20 MED ORDER — FOSFOMYCIN TROMETHAMINE 3 G PO PACK
3.0000 g | PACK | Freq: Once | ORAL | Status: AC
  Administered 2018-06-20: 3 g via ORAL
  Filled 2018-06-20: qty 3

## 2018-06-20 NOTE — ED Notes (Signed)
Pt  Reports  Frequency  Scanty output    Burns  When she  Urinates  symptoms  Started yest   worse today

## 2018-06-20 NOTE — Discharge Instructions (Addendum)
You have been treated with a single dose of antibiotic in the Emergency Department. Continue to monitor your symptoms. Return to the ED immediately for any changes, including urinary retention, hematuria, or fevers. You will be notified if your urine culture results warrant a change in therapy.

## 2018-06-20 NOTE — ED Notes (Signed)
Bladder scan indicates 52 ml present

## 2018-06-20 NOTE — ED Notes (Signed)
Pt verbalized understanding of discharge instructions. NAD at this time. 

## 2018-06-20 NOTE — ED Notes (Signed)
Lab called  has  Enough urine  For a  Culture  Will run

## 2018-06-20 NOTE — ED Triage Notes (Signed)
Pt to ed with c/o urinary pain and pressure and frequency that started yesterday.

## 2018-06-20 NOTE — ED Provider Notes (Signed)
[facility] Emergency Department Provider Note ____________________________________________  Time seen: 76  I have reviewed the triage vital signs and the nursing notes.  HISTORY  Chief Complaint  Urinary Frequency  HPI [name] is a 67 y.o. female who presents with ED evaluation of a 1 day complaint of urinary pain as well as bladder pressure and frequency.  Patient denies any interim fevers, chills, sweats patient also denies any frank hematuria, flank pain, nausea, vomiting. Gives a history of some chronic bladder prolapse, noting that a previous bladder sling at the time of her abdominal hysterectomy has since failed.  She reports she is been noncompliant with appropriate follow-up with her gynecologist.  She denies any new symptoms related to bladder prolapse.  Past Medical History:  Diagnosis Date  . Arthritis   . B12 deficiency 07/09/2015  . Crohn disease ([place]) JULY 2011 FEDA   PERSISTENT TI ULCERS seen on CE, NO NSAID USE  . Endometriosis    HISTORY  LEADING TO HYSTERECTOMY  . Fibromyalgia   . Fibromyalgia 12/09/2013  . Gastric ulcer   . GERD (gastroesophageal reflux disease)   . Headache   . Helicobacter pylori gastritis    HISTORY  . History of hiatal hernia   . Hives   . Iron (Fe) deficiency anemia 2009   JAN 2012 NL HB & FERRITIN  . Obesity   . Osteoporosis 11/20/2014  . Plantar fasciitis     Patient Active Problem List   Diagnosis Date Noted  . Closed fracture of proximal end of left fibula 10/20/17  12/02/2017  . Thyroid nodule 09/23/2017  . Edema of left lower extremity 08/25/2017  . Thunderclap headache 09/06/2015  . Vision changes 09/06/2015  . Dizziness 09/06/2015  . New onset of headaches after age 19 09/06/2015  . B12 deficiency 07/09/2015  . Acute bilateral lower abdominal pain 12/08/2014  . Osteoporosis 11/20/2014  . Chronic back pain 11/10/2014  . GERD (gastroesophageal reflux disease) 06/16/2014  . Impaired fasting  glucose 03/29/2014  . Fibromyalgia 12/09/2013  . Crohn's ileitis ([place]) 01/17/2011  . Iron deficiency anemia 04/10/2010    Past Surgical History:  Procedure Laterality Date  . ABDOMINAL HYSTERECTOMY    . CATARACT EXTRACTION W/PHACO Right 12/05/2015   Procedure: CATARACT EXTRACTION PHACO AND INTRAOCULAR LENS PLACEMENT (IOC);  Surgeon: Birder Robson, MD;  Location: [facility] ORS;  Service: Ophthalmology;  Laterality: Right;  [place] 00:59.9 AP% 21.9 CDE 13.10 fluid pack lot # [id number]  . CATARACT EXTRACTION W/PHACO Left 06/24/2016   Procedure: CATARACT EXTRACTION PHACO AND INTRAOCULAR LENS PLACEMENT LEFT EYE; CDE:  8.33;  Surgeon: Tonny Branch, MD;  Location: [facility];  Service: Ophthalmology;  Laterality: Left;  . COLONOSCOPY    . ESOPHAGOGASTRODUODENOSCOPY  10/14/2007   Esophagus showed 2 cm pink tongue seen extending from the GE junction.  Biopsies obtained via cold forceps.  Otherwise the  esophagus was without erosions, mass, ulceration, or stricture / . Large hiatal hernia pouch with linear erosions, and two 3-6 mmulcers seen in the pouch.  Biopsies obtained via cold forceps to  evaluate for H. pylori gastritis or malignancy  . Ileocolonoscopy  10/14/2007   Multiple 1-3 mm terminal ileum ulcers biopsied  The ulcers seemed to only involve the last 5 cm of her terminal ileum, and 10 cm of her ileum was visualized.  Otherwise the colon  was without polyps, masses, diverticula, or AVMs.  She had a normal retroflexed view of the rectum  . OVARIAN CYST REMOVAL    .  TOTAL ABDOMINAL HYSTERECTOMY W/ BILATERAL SALPINGOOPHORECTOMY    . UPPER GASTROINTESTINAL ENDOSCOPY  JULY 2011 DIARRHEA/FEDA    Bx-H.PYLORI GASTRITIS, NL DUODENUM    Prior to Admission medications   Medication Sig Start Date End Date Taking? Authorizing Provider  acetaminophen (TYLENOL) 325 MG tablet Take 650 mg by mouth as needed for moderate pain or headache.     [provider]  meclizine (ANTIVERT) 25 MG tablet Take 25 mg by  mouth 3 (three) times daily as needed. 04/03/18   [provider]  omeprazole (PRILOSEC) 20 MG capsule TAKE 1 CAPSULE (20 MG TOTAL) BY MOUTH DAILY. 05/27/16   Carlis Stable, NP  SUMAtriptan (IMITREX) 50 MG tablet TAKE 1 TABLET BY MOUTH EVERY 2 HOURS AS NEEDED 04/03/18   [provider]    Allergies Cefzil [cefprozil]; Celebrex [celecoxib]; and Sulfonamide derivatives  Family History  Problem Relation Age of Onset  . Diabetes Mother   . Cancer Father   . Diabetes Father   . Diabetes Sister   . Diabetes Brother   . Colon polyps Neg Hx   . Colon cancer Neg Hx   . Stroke Neg Hx     Social History Social History   Tobacco Use  . Smoking status: Never Smoker  . Smokeless tobacco: Never Used  Substance Use Topics  . Alcohol use: No  . Drug use: No    Review of Systems  Constitutional: Negative for fever. Cardiovascular: Negative for chest pain. Respiratory: Negative for shortness of breath. Gastrointestinal: Negative for abdominal pain, vomiting and diarrhea. Genitourinary: Positive for dysuria and urinary frequency Musculoskeletal: Negative for back pain. Skin: Negative for rash. Neurological: Negative for headaches, focal weakness or numbness. ____________________________________________  PHYSICAL EXAM:  VITAL SIGNS: ED Triage Vitals  Enc Vitals Group     BP 06/20/18 1900 (!) 133/58     Pulse Rate 06/20/18 1900 81     Resp 06/20/18 1900 18     Temp 06/20/18 1900 97.9 F (36.6 C)     Temp Source 06/20/18 1900 Oral     SpO2 06/20/18 1900 94 %     Weight 06/20/18 1858 180 lb (81.6 kg)     Height 06/20/18 1858 5\' 3"  (1.6 m)     Head Circumference --      Peak Flow --      Pain Score 06/20/18 1858 4     Pain Loc --      Pain Edu? --      Excl. in [place]? --     Constitutional: Alert and oriented. Well appearing and in no distress. Head: Normocephalic and atraumatic. Eyes: Conjunctivae are normal. Normal extraocular movements Cardiovascular: Normal  rate, regular rhythm. Normal distal pulses. Respiratory: Normal respiratory effort. No wheezes/rales/rhonchi. Gastrointestinal: Soft and nontender. No distention. Normal bowel sounds. No CVA tenderness Musculoskeletal: Nontender with normal range of motion in all extremities.  Neurologic:  Normal gait without ataxia. Normal speech and language. No gross focal neurologic deficits are appreciated. Skin:  Skin is warm, dry and intact. No rash noted. Psychiatric: Mood and affect are normal. Patient exhibits appropriate insight and judgment. ____________________________________________   LABS (pertinent positives/negatives)  Labs Reviewed  URINALYSIS, COMPLETE (UACMP) WITH MICROSCOPIC - Abnormal; Notable for the following components:      Result Value   Color, Urine YELLOW (*)    APPearance CLEAR (*)    Hgb urine dipstick MODERATE (*)    Protein, ur 100 (*)    Leukocytes, UA SMALL (*)  RBC / HPF >50 (*)    WBC, UA >50 (*)    Bacteria, UA RARE (*)    Crystals PRESENT (*)    All other components within normal limits  URINE CULTURE  ____________________________________________  PROCEDURES  Bladder Scan - 57 ml Fosfomycin 3 g PO ____________________________________________  INITIAL IMPRESSION / ASSESSMENT AND PLAN / ED COURSE  Patient with ED evaluation of a 1 day complaint of urinary pain as well as bladder pressure and frequency.  Patient denies any interim fevers, chills, sweats patient also denies any frank hematuria, flank pain, nausea, vomiting. She is relieved with her diagnosis and single antibiotic dose. She has a confirmed cephalosporin & sulfa drug allergy. We opted for fosfomycin for her uncomplicated UTI. She will follow-up with her provider, or return as needed. Urine culture is pending.  ____________________________________________  FINAL CLINICAL IMPRESSION(S) / ED DIAGNOSES  Final diagnoses:  Acute cystitis without hematuria      Willi Borowiak, Dannielle Karvonen,  PA-C 06/20/18 2142    Rudene Re, MD 06/21/18 [phone]

## 2018-06-23 LAB — URINE CULTURE: Special Requests: NORMAL

## 2018-07-01 ENCOUNTER — Other Ambulatory Visit (HOSPITAL_COMMUNITY): Payer: Self-pay

## 2018-07-02 ENCOUNTER — Ambulatory Visit: Payer: Self-pay | Admitting: Family Medicine

## 2018-07-02 ENCOUNTER — Ambulatory Visit (HOSPITAL_COMMUNITY): Payer: Self-pay | Admitting: Internal Medicine

## 2018-07-02 ENCOUNTER — Encounter: Payer: Self-pay | Admitting: Family Medicine

## 2018-07-02 ENCOUNTER — Ambulatory Visit (INDEPENDENT_AMBULATORY_CARE_PROVIDER_SITE_OTHER): Payer: Medicare Other | Admitting: Family Medicine

## 2018-07-02 VITALS — BP 132/80 | Ht 63.0 in | Wt 191.0 lb

## 2018-07-02 DIAGNOSIS — M5442 Lumbago with sciatica, left side: Secondary | ICD-10-CM | POA: Diagnosis not present

## 2018-07-02 DIAGNOSIS — M5441 Lumbago with sciatica, right side: Secondary | ICD-10-CM | POA: Diagnosis not present

## 2018-07-02 DIAGNOSIS — E538 Deficiency of other specified B group vitamins: Secondary | ICD-10-CM | POA: Diagnosis not present

## 2018-07-02 DIAGNOSIS — G8929 Other chronic pain: Secondary | ICD-10-CM | POA: Diagnosis not present

## 2018-07-02 MED ORDER — CYANOCOBALAMIN 1000 MCG/ML IJ SOLN
1000.0000 ug | Freq: Once | INTRAMUSCULAR | Status: AC
  Administered 2018-07-02: 1000 ug via INTRAMUSCULAR

## 2018-07-02 MED ORDER — ETODOLAC 400 MG PO TABS: ORAL_TABLET | ORAL | 1 refills | Status: DC

## 2018-07-02 NOTE — Progress Notes (Signed)
   Subjective:    Patient ID: [name], female    DOB: October 20, 1950, 67 y.o.   MRN: [id number]      Review of Systems  Musculoskeletal: Positive for back pain.       Objective:   Physical Exam        Assessment & Plan:

## 2018-07-02 NOTE — Progress Notes (Signed)
   Subjective:    Patient ID: [name], female    DOB: June 26, 1951, 67 y.o.   MRN: [id number]  Back Pain  This is a chronic problem. Episode onset: years ago. The pain is present in the lumbar spine. The pain radiates to the right thigh and left thigh. Pertinent negatives include no abdominal pain, chest pain or weakness.  saw Dr. Rolena Infante and was told to do exercises. Tried a friends medication ( lodine)  because she was in a lot of pain and it helped. She would like a prescription for that.  In the past the patient states that she broke out with Celebrex She states when she took her friend's etodolac she did not break out with that she would like to try some because it did help her with her back pain She relates a lot of lower back pain radiates down the legs she saw Dr. Rolena Infante he recommended stretching exercises she states that he did do some x-rays She does relate she has low back pain radiates into the legs hurts every single day worse when she is at work she works at the [employer] she does do waitressing and carrying pans of dishes she states the pain is in the lower back radiates into the legs denies weakness in the legs no loss of bowel bowel or bladder Wants to get b12 injection today.     Review of Systems  Constitutional: Negative for activity change and appetite change.  HENT: Negative for congestion and rhinorrhea.   Respiratory: Negative for cough and shortness of breath.   Cardiovascular: Negative for chest pain and leg swelling.  Gastrointestinal: Negative for abdominal pain, nausea and vomiting.  Musculoskeletal: Positive for back pain.  Skin: Negative for color change.  Neurological: Negative for dizziness and weakness.  Psychiatric/Behavioral: Negative for agitation and confusion.       Objective:   Physical Exam  Constitutional: She appears well-nourished. No distress.  HENT:  Head: Normocephalic and atraumatic.  Eyes: Right eye exhibits no discharge. Left eye  exhibits no discharge.  Neck: No tracheal deviation present.  Cardiovascular: Normal rate, regular rhythm and normal heart sounds.  No murmur heard. Pulmonary/Chest: Effort normal and breath sounds normal. No respiratory distress.  Musculoskeletal: She exhibits no edema.  Lymphadenopathy:    She has no cervical adenopathy.  Neurological: She is alert. Coordination normal.  Skin: Skin is warm and dry.  Psychiatric: She has a normal mood and affect. Her behavior is normal.  Vitals reviewed.  Patient can rotate extend and flex without much difficulty negative straight leg raise       Assessment & Plan:  We have requested the records from Dr. Rolena Infante including the records of the x-ray Patient may use Lodine as needed for back pain if she starts having rash or hives stop the medicine patient is aware She is also aware that periodically we will need to monitor her kidney functions and also there is an increased risk of GI bleeds with this medicine  For now she will do stretching exercises once we receive the notes we may proceed forward with other measures  B12 shot today  Follow-up in 4 to 6 weeks

## 2018-07-06 ENCOUNTER — Telehealth: Payer: Self-pay | Admitting: Family Medicine

## 2018-07-06 NOTE — Telephone Encounter (Signed)
Please let the patient know that I did review over her notes from Dr. Rolena Infante.  They did do x-rays of the low back which showed degenerative changes but no other specific findings  According to the notes from Dr. Rolena Infante he states that if the patient does not improve with exercises over the next month for her to contact them and their partner Dr. Dossie Der can do trigger point injection therapy.  Therefore the patient may use a medication do exercises and either follow-up with Dr. Rolena Infante partner Dr. ramus in 4 to 6 weeks or the patient can follow-up with [place] in 6 weeks if she would rather do so in regards to this back pain   If she tries all these different measures over the next 6 to 8 weeks and not seeing improvement then MRI can be considered

## 2018-07-07 NOTE — Telephone Encounter (Signed)
Contacted patient. Pt verbalized understanding and states she will call back to make a 6 week appointment with office. Pt states that she believes that someone in [place] also does the injections.

## 2018-07-07 NOTE — Telephone Encounter (Signed)
Left message to return call 

## 2018-07-26 ENCOUNTER — Emergency Department: Payer: Medicare Other

## 2018-07-26 ENCOUNTER — Other Ambulatory Visit: Payer: Self-pay

## 2018-07-26 ENCOUNTER — Encounter: Payer: Self-pay | Admitting: Emergency Medicine

## 2018-07-26 ENCOUNTER — Emergency Department
Admission: EM | Admit: 2018-07-26 | Discharge: 2018-07-26 | Disposition: A | Discharge status: Home / Self Care | Payer: Medicare Other | Attending: Emergency Medicine | Admitting: Emergency Medicine

## 2018-07-26 DIAGNOSIS — R6889 Other general symptoms and signs: Secondary | ICD-10-CM | POA: Insufficient documentation

## 2018-07-26 DIAGNOSIS — K5090 Crohn's disease, unspecified, without complications: Secondary | ICD-10-CM | POA: Insufficient documentation

## 2018-07-26 DIAGNOSIS — R0789 Other chest pain: Secondary | ICD-10-CM | POA: Diagnosis not present

## 2018-07-26 DIAGNOSIS — M7989 Other specified soft tissue disorders: Secondary | ICD-10-CM | POA: Diagnosis not present

## 2018-07-26 DIAGNOSIS — Z79899 Other long term (current) drug therapy: Secondary | ICD-10-CM | POA: Diagnosis not present

## 2018-07-26 DIAGNOSIS — J111 Influenza due to unidentified influenza virus with other respiratory manifestations: Secondary | ICD-10-CM | POA: Diagnosis not present

## 2018-07-26 DIAGNOSIS — J209 Acute bronchitis, unspecified: Secondary | ICD-10-CM

## 2018-07-26 DIAGNOSIS — R05 Cough: Secondary | ICD-10-CM | POA: Diagnosis not present

## 2018-07-26 LAB — INFLUENZA PANEL BY PCR (TYPE A & B)
Influenza A By PCR: NEGATIVE
Influenza B By PCR: NEGATIVE

## 2018-07-26 MED ORDER — PREDNISONE 20 MG PO TABS: 20.0000 mg | ORAL_TABLET | Freq: Two times a day (BID) | ORAL | 0 refills | Status: AC

## 2018-07-26 MED ORDER — ALBUTEROL SULFATE HFA 108 (90 BASE) MCG/ACT IN AERS: 2.0000 | INHALATION_SPRAY | RESPIRATORY_TRACT | 0 refills | Status: DC | PRN

## 2018-07-26 NOTE — ED Provider Notes (Signed)
[facility] Emergency Department Provider Note  ____________________________________________  Time seen: Approximately 2:27 PM  I have reviewed the triage vital signs and the nursing notes.   HISTORY  Chief Complaint Generalized Body Aches    HPI [name] is a 67 y.o. female with a history of Crohn's disease, fibromyalgia, GERD who complains of cough sore throat subjective fever body aches and chest tightness that started today.   Chest tightness with coughing, not pleuritic and not exertional.  Nonradiating.  Shortness of breath feels worse with ambulation, no alleviating factors.  No vomiting or diarrhea     Past Medical History:  Diagnosis Date  . Arthritis   . B12 deficiency 07/09/2015  . Crohn disease ([place]) JULY 2011 FEDA   PERSISTENT TI ULCERS seen on CE, NO NSAID USE  . Endometriosis    HISTORY  LEADING TO HYSTERECTOMY  . Fibromyalgia   . Fibromyalgia 12/09/2013  . Gastric ulcer   . GERD (gastroesophageal reflux disease)   . Headache   . Helicobacter pylori gastritis    HISTORY  . History of hiatal hernia   . Hives   . Iron (Fe) deficiency anemia 2009   JAN 2012 NL HB & FERRITIN  . Obesity   . Osteoporosis 11/20/2014  . Plantar fasciitis      Patient Active Problem List   Diagnosis Date Noted  . Closed fracture of proximal end of left fibula 10/20/17  12/02/2017  . Thyroid nodule 09/23/2017  . Edema of left lower extremity 08/25/2017  . Thunderclap headache 09/06/2015  . Vision changes 09/06/2015  . Dizziness 09/06/2015  . New onset of headaches after age 54 09/06/2015  . B12 deficiency 07/09/2015  . Acute bilateral lower abdominal pain 12/08/2014  . Osteoporosis 11/20/2014  . Chronic back pain 11/10/2014  . GERD (gastroesophageal reflux disease) 06/16/2014  . Impaired fasting glucose 03/29/2014  . Fibromyalgia 12/09/2013  . Crohn's ileitis ([place]) 01/17/2011  . Iron deficiency anemia 04/10/2010     Past Surgical History:   Procedure Laterality Date  . ABDOMINAL HYSTERECTOMY    . CATARACT EXTRACTION W/PHACO Right 12/05/2015   Procedure: CATARACT EXTRACTION PHACO AND INTRAOCULAR LENS PLACEMENT (IOC);  Surgeon: Birder Robson, MD;  Location: [facility] ORS;  Service: Ophthalmology;  Laterality: Right;  [place] 00:59.9 AP% 21.9 CDE 13.10 fluid pack lot # [id number]  . CATARACT EXTRACTION W/PHACO Left 06/24/2016   Procedure: CATARACT EXTRACTION PHACO AND INTRAOCULAR LENS PLACEMENT LEFT EYE; CDE:  8.33;  Surgeon: Tonny Branch, MD;  Location: [facility] ORS;  Service: Ophthalmology;  Laterality: Left;  . COLONOSCOPY    . ESOPHAGOGASTRODUODENOSCOPY  10/14/2007   Esophagus showed 2 cm pink tongue seen extending from the GE junction.  Biopsies obtained via cold forceps.  Otherwise the  esophagus was without erosions, mass, ulceration, or stricture / . Large hiatal hernia pouch with linear erosions, and two 3-6 mmulcers seen in the pouch.  Biopsies obtained via cold forceps to  evaluate for H. pylori gastritis or malignancy  . Ileocolonoscopy  10/14/2007   Multiple 1-3 mm terminal ileum ulcers biopsied  The ulcers seemed to only involve the last 5 cm of her terminal ileum, and 10 cm of her ileum was visualized.  Otherwise the colon  was without polyps, masses, diverticula, or AVMs.  She had a normal retroflexed view of the rectum  . OVARIAN CYST REMOVAL    . TOTAL ABDOMINAL HYSTERECTOMY W/ BILATERAL SALPINGOOPHORECTOMY    . UPPER GASTROINTESTINAL ENDOSCOPY  JULY 2011 DIARRHEA/FEDA  Bx-H.PYLORI GASTRITIS, NL DUODENUM     Prior to Admission medications   Medication Sig Start Date End Date Taking? Authorizing Provider  acetaminophen (TYLENOL) 325 MG tablet Take 650 mg by mouth as needed for moderate pain or headache.    Yes [provider]  albuterol (PROVENTIL HFA) 108 (90 Base) MCG/ACT inhaler Inhale 2 puffs into the lungs every 4 (four) hours as needed for wheezing or shortness of breath. 07/26/18   Carrie Mew, MD   predniSONE (DELTASONE) 20 MG tablet Take 1 tablet (20 mg total) by mouth 2 (two) times daily with a meal for 3 days. 07/26/18 07/29/18  Carrie Mew, MD     Allergies Cefzil [cefprozil]; Celebrex [celecoxib]; and Sulfonamide derivatives   Family History  Problem Relation Age of Onset  . Diabetes Mother   . Cancer Father   . Diabetes Father   . Diabetes Sister   . Diabetes Brother   . Colon polyps Neg Hx   . Colon cancer Neg Hx   . Stroke Neg Hx     Social History Social History   Tobacco Use  . Smoking status: Never Smoker  . Smokeless tobacco: Never Used  Substance Use Topics  . Alcohol use: No  . Drug use: No    Review of Systems  Constitutional:   Positive subjective fever, no chills.  ENT:   Positive sore throat.  Positive rhinorrhea. Cardiovascular:   Positive as above chest tightness without syncope. Respiratory:   Positive shortness of breath and nonproductive cough. Gastrointestinal:   Negative for abdominal pain, vomiting and diarrhea.  Musculoskeletal:   Negative for focal pain or swelling, positive generalized body ache All other systems reviewed and are negative except as documented above in ROS and HPI.  ____________________________________________   PHYSICAL EXAM:  VITAL SIGNS: ED Triage Vitals  Enc Vitals Group     BP 07/26/18 1011 134/81     Pulse Rate 07/26/18 1011 70     Resp 07/26/18 1011 (!) 22     Temp 07/26/18 1011 98.4 F (36.9 C)     Temp Source 07/26/18 1011 Oral     SpO2 07/26/18 1011 96 %     Weight 07/26/18 0938 180 lb (81.6 kg)     Height 07/26/18 0938 5\' 4"  (1.626 m)     Head Circumference --      Peak Flow --      Pain Score 07/26/18 0933 7     Pain Loc --      Pain Edu? --      Excl. in [place]? --     Vital signs reviewed, nursing assessments reviewed.   Constitutional:   Alert and oriented. Non-toxic appearance. Eyes:   Conjunctivae are normal. EOMI. PERRL. ENT      Head:   Normocephalic and atraumatic.       Nose:   No congestion/rhinnorhea.       Mouth/Throat:   MMM, no pharyngeal erythema. No peritonsillar mass.       Neck:   No meningismus. Full ROM. Hematological/Lymphatic/Immunilogical:   No cervical lymphadenopathy. Cardiovascular:   RRR. Symmetric bilateral radial and DP pulses.  No murmurs. Cap refill less than 2 seconds. Respiratory:   Normal respiratory effort without tachypnea/retractions.  Mild expiratory wheezing, no focal crackles.  Good air entry in all lung fields. Gastrointestinal:   Soft and nontender. Non distended. There is no CVA tenderness.  No rebound, rigidity, or guarding. Musculoskeletal:   Normal range of motion in all extremities. No  joint effusions.  No lower extremity tenderness.  No edema. Neurologic:   Normal speech and language.  Motor grossly intact. No acute focal neurologic deficits are appreciated.  Skin:    Skin is warm, dry and intact. No rash noted.  No petechiae, purpura, or bullae.  ____________________________________________    LABS (pertinent positives/negatives) (all labs ordered are listed, but only abnormal results are displayed) Labs Reviewed  INFLUENZA PANEL BY PCR (TYPE A & B)   ____________________________________________   EKG  Interpreted by me  Date: 07/26/2018  Rate: 75  Rhythm: normal sinus rhythm  QRS Axis: normal  Intervals: normal  ST/T Wave abnormalities: normal  Conduction Disutrbances: none  Narrative Interpretation: unremarkable      ____________________________________________    RADIOLOGY  Dg Chest 2 View  Result Date: 07/26/2018 CLINICAL DATA:  Cough and bilateral crackles.  Sore throat. EXAM: CHEST - 2 VIEW COMPARISON:  10/27/2017 FINDINGS: Again noted is a large hiatal hernia containing gas. Lungs are clear without focal airspace disease or pulmonary edema. Heart size is normal and stable. Mild degenerative changes in the thoracic spine. No pleural effusions. IMPRESSION: No active cardiopulmonary  disease. Large hiatal hernia. Electronically Signed   By: Markus Daft M.D.   On: 07/26/2018 11:11    ____________________________________________   PROCEDURES Procedures  ____________________________________________    CLINICAL IMPRESSION / ASSESSMENT AND PLAN / ED COURSE  Pertinent labs & imaging results that were available during my care of the patient were reviewed by me and considered in my medical decision making (see chart for details).    Patient presents with shortness of breath and constellation of other symptoms consistent with a viral respiratory illness.  Chest x-ray is unremarkable without evidence of pulmonary edema pneumothorax or pneumonia.  Influenza test is negative.  Vital signs are normal and EKG is normal, patient is suitable for discharge home and outpatient follow-up.  Symptomatic management with low-dose prednisone and albuterol.  Return precautions given.  Doubt ACS PE or dissection      ____________________________________________   FINAL CLINICAL IMPRESSION(S) / ED DIAGNOSES    Final diagnoses:  Acute bronchitis, unspecified organism  Flu-like symptoms     ED Discharge Orders         Ordered    predniSONE (DELTASONE) 20 MG tablet  2 times daily with meals     07/26/18 1426    albuterol (PROVENTIL HFA) 108 (90 Base) MCG/ACT inhaler  Every 4 hours PRN     07/26/18 1426          Portions of this note were generated with dragon dictation software. Dictation errors may occur despite best attempts at proofreading.   Carrie Mew, MD 07/26/18 [phone]

## 2018-07-26 NOTE — Discharge Instructions (Signed)
Your chest x-ray looked normal and your flu test was negative.  This is most likely a viral infection that is causing her respiratory illness.  Albuterol and prednisone can help with this in addition to anti-inflammatory medicine.  With time this should improve and return to normal.  Please follow-up with your primary care doctor this week for continued monitoring of your symptoms.  Return to the ED if you start having a fever or difficulty breathing or other worsening symptoms.

## 2018-07-26 NOTE — ED Notes (Addendum)
Pt assisted to tiolet to void Xray tech reports that pt became short of breath while in xray - pt assessed at this time and appears in NAD - pt reports that she becomes short of breath at times but that currently she is at her baseline

## 2018-07-26 NOTE — ED Triage Notes (Signed)
Cough,  sore throat, fever , body aches, chest tightness

## 2018-08-04 DIAGNOSIS — Z029 Encounter for administrative examinations, unspecified: Secondary | ICD-10-CM

## 2018-08-05 ENCOUNTER — Ambulatory Visit: Payer: Medicare Other | Admitting: Nurse Practitioner

## 2018-08-05 NOTE — Progress Notes (Deleted)
Referring Provider: Kathyrn Drown, MD Primary Care Physician:  Kathyrn Drown, MD Primary GI:  Dr.   Rayne Du chief complaint on file.   HPI:   [name] is a 68 y.o. female who presents to schedule a colonoscopy.  Phone/nurse triage was deferred to office visit due to medications.  Patient was last seen in our office 08/25/2017 for Crohn's disease, GERD.  Chronic history of anemia and fibromyalgia.  Previous CBC at that time on 07/30/2017 found a hemoglobin of 12.1 (normal).  Iron was low at 27, low saturation at 9%, ferritin declined at 62 although his technically still normal.  She was previously well controlled on Pentasa but since switching to Medicare she had to cut back her medications due to affordability.  Sulfasalazine was not an option due to the only Crohn's medicine that releases in the small bowel is Pentasa.  At her last visit she had been out of Pentasa for several weeks and was told that it would cost $600 per month by her insurance.  She had recently cut down to once a day due to cost.  Her energy was poor in the setting of chronic anemia with IV iron administration per hematology.  GERD well controlled on PPI.  No other GI complaints.  Due for repeat colonoscopy in March 2019.  Recommended follow-up labs, we would attempt to work with insurance for cost, colonoscopy, follow-up in 2 months to see Dr. Oneida Alar.  Labs ordered 08/25/2017 were not completed.  Her colonoscopy was initially scheduled for 10/03/2017 but she had to cancel due to more pressing health concerns.  We continue to work with insurance and obtaining a prior Auth which would still be expensive.  We sent her Pentasa patient assistance paperwork.  The patient has not called [place] since other than to cancel her colonoscopy.  Today she states   Past Medical History:  Diagnosis Date  . Arthritis   . B12 deficiency 07/09/2015  . Crohn disease ([place]) JULY 2011 FEDA   PERSISTENT TI ULCERS seen on CE, NO NSAID USE  .  Endometriosis    HISTORY  LEADING TO HYSTERECTOMY  . Fibromyalgia   . Fibromyalgia 12/09/2013  . Gastric ulcer   . GERD (gastroesophageal reflux disease)   . Headache   . Helicobacter pylori gastritis    HISTORY  . History of hiatal hernia   . Hives   . Iron (Fe) deficiency anemia 2009   JAN 2012 NL HB & FERRITIN  . Obesity   . Osteoporosis 11/20/2014  . Plantar fasciitis     Past Surgical History:  Procedure Laterality Date  . ABDOMINAL HYSTERECTOMY    . CATARACT EXTRACTION W/PHACO Right 12/05/2015   Procedure: CATARACT EXTRACTION PHACO AND INTRAOCULAR LENS PLACEMENT (IOC);  Surgeon: Birder Robson, MD;  Location: [facility] ORS;  Service: Ophthalmology;  Laterality: Right;  [place] 00:59.9 AP% 21.9 CDE 13.10 fluid pack lot # [id number]  . CATARACT EXTRACTION W/PHACO Left 06/24/2016   Procedure: CATARACT EXTRACTION PHACO AND INTRAOCULAR LENS PLACEMENT LEFT EYE; CDE:  8.33;  Surgeon: Tonny Branch, MD;  Location: [facility];  Service: Ophthalmology;  Laterality: Left;  . COLONOSCOPY    . ESOPHAGOGASTRODUODENOSCOPY  10/14/2007   Esophagus showed 2 cm pink tongue seen extending from the GE junction.  Biopsies obtained via cold forceps.  Otherwise the  esophagus was without erosions, mass, ulceration, or stricture / . Large hiatal hernia pouch with linear erosions, and two 3-6 mmulcers seen in the pouch.  Biopsies obtained  via cold forceps to  evaluate for H. pylori gastritis or malignancy  . Ileocolonoscopy  10/14/2007   Multiple 1-3 mm terminal ileum ulcers biopsied  The ulcers seemed to only involve the last 5 cm of her terminal ileum, and 10 cm of her ileum was visualized.  Otherwise the colon  was without polyps, masses, diverticula, or AVMs.  She had a normal retroflexed view of the rectum  . OVARIAN CYST REMOVAL    . TOTAL ABDOMINAL HYSTERECTOMY W/ BILATERAL SALPINGOOPHORECTOMY    . UPPER GASTROINTESTINAL ENDOSCOPY  JULY 2011 DIARRHEA/FEDA    Bx-H.PYLORI GASTRITIS, NL DUODENUM    Current  Outpatient Medications  Medication Sig Dispense Refill  . acetaminophen (TYLENOL) 325 MG tablet Take 650 mg by mouth as needed for moderate pain or headache.     . albuterol (PROVENTIL HFA) 108 (90 Base) MCG/ACT inhaler Inhale 2 puffs into the lungs every 4 (four) hours as needed for wheezing or shortness of breath. 1 Inhaler 0   No current facility-administered medications for this visit.     Allergies as of 08/05/2018 - Review Complete 07/26/2018  Allergen Reaction Noted  . Cefzil [cefprozil] Rash 05/06/2017  . Celebrex [celecoxib] Hives and Itching 01/07/2011  . Sulfonamide derivatives Itching     Family History  Problem Relation Age of Onset  . Diabetes Mother   . Cancer Father   . Diabetes Father   . Diabetes Sister   . Diabetes Brother   . Colon polyps Neg Hx   . Colon cancer Neg Hx   . Stroke Neg Hx     Social History   Socioeconomic History  . Marital status: Married    Spouse name: Alvester Chou   . Number of children: 3  . Years of education: 6  . Highest education level: Not on file  Occupational History  . Occupation: Retired  Scientific laboratory technician  . Financial resource strain: Not on file  . Food insecurity:    Worry: Not on file    Inability: Not on file  . Transportation needs:    Medical: Not on file    Non-medical: Not on file  Tobacco Use  . Smoking status: Never Smoker  . Smokeless tobacco: Never Used  Substance and Sexual Activity  . Alcohol use: No  . Drug use: No  . Sexual activity: Yes    Birth control/protection: Surgical  Lifestyle  . Physical activity:    Days per week: Not on file    Minutes per session: Not on file  . Stress: Not on file  Relationships  . Social connections:    Talks on phone: Not on file    Gets together: Not on file    Attends religious service: Not on file    Active member of club or organization: Not on file    Attends meetings of clubs or organizations: Not on file    Relationship status: Not on file  Other Topics  Concern  . Not on file  Social History Narrative   Lives w/ husband   Caffeine use: Drinks 1-2 cups coffee per day    Review of Systems: General: Negative for anorexia, weight loss, fever, chills, fatigue, weakness. Eyes: Negative for vision changes.  ENT: Negative for hoarseness, difficulty swallowing , nasal congestion. CV: Negative for chest pain, angina, palpitations, dyspnea on exertion, peripheral edema.  Respiratory: Negative for dyspnea at rest, dyspnea on exertion, cough, sputum, wheezing.  GI: See history of present illness. GU:  Negative for dysuria, hematuria, urinary incontinence,  urinary frequency, nocturnal urination.  MS: Negative for joint pain, low back pain.  Derm: Negative for rash or itching.  Neuro: Negative for weakness, abnormal sensation, seizure, frequent headaches, memory loss, confusion.  Psych: Negative for anxiety, depression, suicidal ideation, hallucinations.  Endo: Negative for unusual weight change.  Heme: Negative for bruising or bleeding. Allergy: Negative for rash or hives.   Physical Exam: There were no vitals taken for this visit. General:   Alert and oriented. Pleasant and cooperative. Well-nourished and well-developed.  Head:  Normocephalic and atraumatic. Eyes:  Without icterus, sclera clear and conjunctiva pink.  Ears:  Normal auditory acuity. Mouth:  No deformity or lesions, oral mucosa pink.  Throat/Neck:  Supple, without mass or thyromegaly. Cardiovascular:  S1, S2 present without murmurs appreciated. Normal pulses noted. Extremities without clubbing or edema. Respiratory:  Clear to auscultation bilaterally. No wheezes, rales, or rhonchi. No distress.  Gastrointestinal:  +BS, soft, non-tender and non-distended. No HSM noted. No guarding or rebound. No masses appreciated.  Rectal:  Deferred  Musculoskalatal:  Symmetrical without gross deformities. Normal posture. Skin:  Intact without significant lesions or rashes. Neurologic:  Alert  and oriented x4;  grossly normal neurologically. Psych:  Alert and cooperative. Normal mood and affect. Heme/Lymph/Immune: No significant cervical adenopathy. No excessive bruising noted.    08/05/2018 8:00 AM   Disclaimer: This note was dictated with voice recognition software. Similar sounding words can inadvertently be transcribed and may not be corrected upon review.

## 2018-08-07 ENCOUNTER — Other Ambulatory Visit (HOSPITAL_COMMUNITY): Payer: Self-pay | Admitting: *Deleted

## 2018-08-07 DIAGNOSIS — D500 Iron deficiency anemia secondary to blood loss (chronic): Secondary | ICD-10-CM

## 2018-08-10 ENCOUNTER — Inpatient Hospital Stay (HOSPITAL_COMMUNITY): Payer: Medicare Other | Attending: Hematology

## 2018-08-10 DIAGNOSIS — Z90722 Acquired absence of ovaries, bilateral: Secondary | ICD-10-CM | POA: Insufficient documentation

## 2018-08-10 DIAGNOSIS — Z90710 Acquired absence of both cervix and uterus: Secondary | ICD-10-CM | POA: Diagnosis not present

## 2018-08-10 DIAGNOSIS — D508 Other iron deficiency anemias: Secondary | ICD-10-CM | POA: Insufficient documentation

## 2018-08-10 DIAGNOSIS — E041 Nontoxic single thyroid nodule: Secondary | ICD-10-CM | POA: Insufficient documentation

## 2018-08-10 DIAGNOSIS — D500 Iron deficiency anemia secondary to blood loss (chronic): Secondary | ICD-10-CM

## 2018-08-10 DIAGNOSIS — Z79899 Other long term (current) drug therapy: Secondary | ICD-10-CM | POA: Diagnosis not present

## 2018-08-10 DIAGNOSIS — K909 Intestinal malabsorption, unspecified: Secondary | ICD-10-CM | POA: Diagnosis not present

## 2018-08-10 DIAGNOSIS — E538 Deficiency of other specified B group vitamins: Secondary | ICD-10-CM | POA: Insufficient documentation

## 2018-08-10 LAB — COMPREHENSIVE METABOLIC PANEL
ALBUMIN: 3.9 g/dL (ref 3.5–5.0)
ALK PHOS: 92 U/L (ref 38–126)
ALT: 17 U/L (ref 0–44)
AST: 15 U/L (ref 15–41)
Anion gap: 9 (ref 5–15)
BILIRUBIN TOTAL: 0.5 mg/dL (ref 0.3–1.2)
BUN: 20 mg/dL (ref 8–23)
CALCIUM: 8.3 mg/dL — AB (ref 8.9–10.3)
CO2: 22 mmol/L (ref 22–32)
Chloride: 107 mmol/L (ref 98–111)
Creatinine, Ser: 0.77 mg/dL (ref 0.44–1.00)
GFR calc Af Amer: >60 mL/min (ref >60)
GFR calc non Af Amer: >60 mL/min (ref >60)
GLUCOSE: 98 mg/dL (ref 70–99)
Potassium: 3.6 mmol/L (ref 3.5–5.1)
Sodium: 138 mmol/L (ref 135–145)
TOTAL PROTEIN: 6.9 g/dL (ref 6.5–8.1)

## 2018-08-10 LAB — CBC WITH DIFFERENTIAL/PLATELET
Abs Immature Granulocytes: 0.03 10*3/uL (ref 0.00–0.07)
BASOS PCT: 1 %
Basophils Absolute: 0.1 10*3/uL (ref 0.0–0.1)
EOS ABS: 0.1 10*3/uL (ref 0.0–0.5)
Eosinophils Relative: 1 %
HCT: 32.5 % — ABNORMAL LOW (ref 36.0–46.0)
Hemoglobin: 9.9 g/dL — ABNORMAL LOW (ref 12.0–15.0)
IMMATURE GRANULOCYTES: 0 %
Lymphocytes Relative: 17 %
Lymphs Abs: 1.8 10*3/uL (ref 0.7–4.0)
MCH: 26.8 pg (ref 26.0–34.0)
MCHC: 30.5 g/dL (ref 30.0–36.0)
MCV: 88.1 fL (ref 80.0–100.0)
MONO ABS: 0.6 10*3/uL (ref 0.1–1.0)
MONOS PCT: 6 %
NEUTROS PCT: 75 %
Neutro Abs: 7.9 10*3/uL — ABNORMAL HIGH (ref 1.7–7.7)
Platelets: 234 10*3/uL (ref 150–400)
RBC: 3.69 MIL/uL — ABNORMAL LOW (ref 3.87–5.11)
RDW: 14.2 % (ref 11.5–15.5)
WBC: 10.4 10*3/uL (ref 4.0–10.5)
nRBC: 0 % (ref 0.0–0.2)

## 2018-08-10 LAB — FERRITIN: Ferritin: 14 ng/mL (ref 11–307)

## 2018-08-10 LAB — LACTATE DEHYDROGENASE: LDH: 126 U/L (ref 98–192)

## 2018-08-11 ENCOUNTER — Ambulatory Visit (HOSPITAL_COMMUNITY): Payer: Self-pay | Admitting: Internal Medicine

## 2018-08-12 ENCOUNTER — Encounter (HOSPITAL_COMMUNITY): Payer: Self-pay | Admitting: Internal Medicine

## 2018-08-12 ENCOUNTER — Other Ambulatory Visit: Payer: Self-pay

## 2018-08-12 ENCOUNTER — Inpatient Hospital Stay (HOSPITAL_BASED_OUTPATIENT_CLINIC_OR_DEPARTMENT_OTHER): Payer: Medicare Other | Admitting: Internal Medicine

## 2018-08-12 VITALS — BP 129/48 | HR 68 | Temp 98.1°F | Resp 18 | Wt 193.3 lb

## 2018-08-12 DIAGNOSIS — Z90710 Acquired absence of both cervix and uterus: Secondary | ICD-10-CM | POA: Diagnosis not present

## 2018-08-12 DIAGNOSIS — Z79899 Other long term (current) drug therapy: Secondary | ICD-10-CM

## 2018-08-12 DIAGNOSIS — E538 Deficiency of other specified B group vitamins: Secondary | ICD-10-CM | POA: Diagnosis not present

## 2018-08-12 DIAGNOSIS — K909 Intestinal malabsorption, unspecified: Secondary | ICD-10-CM | POA: Diagnosis not present

## 2018-08-12 DIAGNOSIS — Z90722 Acquired absence of ovaries, bilateral: Secondary | ICD-10-CM

## 2018-08-12 DIAGNOSIS — D500 Iron deficiency anemia secondary to blood loss (chronic): Secondary | ICD-10-CM

## 2018-08-12 DIAGNOSIS — D508 Other iron deficiency anemias: Secondary | ICD-10-CM | POA: Diagnosis not present

## 2018-08-12 DIAGNOSIS — E041 Nontoxic single thyroid nodule: Secondary | ICD-10-CM | POA: Diagnosis not present

## 2018-08-12 NOTE — Progress Notes (Signed)
Diagnosis Iron deficiency anemia due to chronic blood loss - Plan: CBC with Differential/Platelet, Comprehensive metabolic panel, Lactate dehydrogenase, Ferritin, Protein electrophoresis, serum  B12 deficiency - Plan: Vitamin B12, Folate, Methylmalonic acid, serum  Staging Cancer Staging No matching staging information was found for the patient.  Assessment and Plan:   1.  Iron deficiency anemia.  Pt was previously followed by Dr. Whitney Muse and was felt to have anemia due to chronic blood loss and malabsorption, requiring intermittent IV iron.  Labs done 08/10/2018 reviewed and showed WBC 10.4 HB 9.9 plts 234,000.  Chemistries WNL with Cr 0.61 K+ 3.6 and normal LFTs.  Ferritin decreased at 14.    Pt was last treated with IV iron 04/28/2018.  Pt is set up for Feraheme 510 mg IV D1 and D8.  She will RTC in 08/2018 for follow-up after IV iron and repeat labs.  Pt reports she will follow-up with GI in 09/2018.  Last colonoscopy was in 2011.    2.  B12 deficiency.  Continue PO B12 replacement therapy.  Will check B12 levels on RTC.    3.  Chron's disease.  Follow-up with GI especially due to recurrent IDA.  She reports she has appointment in 09/2018.    4  Thyroid nodule.  This was noted on thyroid USN in 08/2017.  She had biopsy in 09/2017 that showed benign nodule.  Pt should follow-up with PCP or endocrinology as directed.    5.  Health maintenance.  GI and mammogram evaluation as recommended.    25 minutes spent with more than 50% spent in counseling and coordination of care.    Current Status:  Pt is seen today for follow-up to go over recent labs.  She denies any blood in stool or urine.    Problem List Patient Active Problem List   Diagnosis Date Noted  . Closed fracture of proximal end of left fibula 10/20/17  [S82.832A] 12/02/2017  . Thyroid nodule [E04.1] 09/23/2017  . Edema of left lower extremity [R60.0] 08/25/2017  . [place] headache [G44.53] 09/06/2015  . Vision changes [H53.9]  09/06/2015  . Dizziness [R42] 09/06/2015  . New onset of headaches after age 59 [R51] 09/06/2015  . B12 deficiency [E53.8] 07/09/2015  . Acute bilateral lower abdominal pain [R10.31, R10.32] 12/08/2014  . Osteoporosis [M81.0] 11/20/2014  . Chronic back pain [M54.9, G89.29] 11/10/2014  . GERD (gastroesophageal reflux disease) [K21.9] 06/16/2014  . Impaired fasting glucose [R73.01] 03/29/2014  . Fibromyalgia [M79.7] 12/09/2013  . Crohn's ileitis ([place]) [W62.03] 01/17/2011  . Iron deficiency anemia [D50.9] 04/10/2010    Past Medical History Past Medical History:  Diagnosis Date  . Arthritis   . B12 deficiency 07/09/2015  . Crohn disease ([place]) JULY 2011 FEDA   PERSISTENT TI ULCERS seen on CE, NO NSAID USE  . Endometriosis    HISTORY  LEADING TO HYSTERECTOMY  . Fibromyalgia   . Fibromyalgia 12/09/2013  . Gastric ulcer   . GERD (gastroesophageal reflux disease)   . Headache   . Helicobacter pylori gastritis    HISTORY  . History of hiatal hernia   . Hives   . Iron (Fe) deficiency anemia 2009   JAN 2012 NL HB & FERRITIN  . Obesity   . Osteoporosis 11/20/2014  . Plantar fasciitis     Past Surgical History Past Surgical History:  Procedure Laterality Date  . ABDOMINAL HYSTERECTOMY    . CATARACT EXTRACTION W/PHACO Right 12/05/2015   Procedure: CATARACT EXTRACTION PHACO AND INTRAOCULAR LENS PLACEMENT (IOC);  Surgeon:  Birder Robson, MD;  Location: [facility] ORS;  Service: Ophthalmology;  Laterality: Right;  [place] 00:59.9 AP% 21.9 CDE 13.10 fluid pack lot # [id number]  . CATARACT EXTRACTION W/PHACO Left 06/24/2016   Procedure: CATARACT EXTRACTION PHACO AND INTRAOCULAR LENS PLACEMENT LEFT EYE; CDE:  8.33;  Surgeon: Tonny Branch, MD;  Location: [facility];  Service: Ophthalmology;  Laterality: Left;  . COLONOSCOPY    . ESOPHAGOGASTRODUODENOSCOPY  10/14/2007   Esophagus showed 2 cm pink tongue seen extending from the GE junction.  Biopsies obtained via cold forceps.  Otherwise the  esophagus was  without erosions, mass, ulceration, or stricture / . Large hiatal hernia pouch with linear erosions, and two 3-6 mmulcers seen in the pouch.  Biopsies obtained via cold forceps to  evaluate for H. pylori gastritis or malignancy  . Ileocolonoscopy  10/14/2007   Multiple 1-3 mm terminal ileum ulcers biopsied  The ulcers seemed to only involve the last 5 cm of her terminal ileum, and 10 cm of her ileum was visualized.  Otherwise the colon  was without polyps, masses, diverticula, or AVMs.  She had a normal retroflexed view of the rectum  . OVARIAN CYST REMOVAL    . TOTAL ABDOMINAL HYSTERECTOMY W/ BILATERAL SALPINGOOPHORECTOMY    . UPPER GASTROINTESTINAL ENDOSCOPY  JULY 2011 DIARRHEA/FEDA    Bx-H.PYLORI GASTRITIS, NL DUODENUM    Family History Family History  Problem Relation Age of Onset  . Diabetes Mother   . Cancer Father   . Diabetes Father   . Diabetes Sister   . Diabetes Brother   . Colon polyps Neg Hx   . Colon cancer Neg Hx   . Stroke Neg Hx      Social History  reports that she has never smoked. She has never used smokeless tobacco. She reports that she does not drink alcohol or use drugs.  Medications  Current Outpatient Medications:  .  acetaminophen (TYLENOL) 325 MG tablet, Take 650 mg by mouth as needed for moderate pain or headache. , Disp: , Rfl:  .  albuterol (PROVENTIL HFA) 108 (90 Base) MCG/ACT inhaler, Inhale 2 puffs into the lungs every 4 (four) hours as needed for wheezing or shortness of breath. (Patient not taking: Reported on 08/12/2018), Disp: 1 Inhaler, Rfl: 0  Allergies Cefzil [cefprozil]; Celebrex [celecoxib]; and Sulfonamide derivatives  Review of Systems Review of Systems - Oncology ROS negative  Physical Exam  Vitals Wt Readings from Last 3 Encounters:  08/12/18 193 lb 4.8 oz (87.7 kg)  07/26/18 180 lb (81.6 kg)  07/02/18 191 lb (86.6 kg)   Temp Readings from Last 3 Encounters:  08/12/18 98.1 F (36.7 C) (Oral)  07/26/18 98.4 F (36.9 C)  (Oral)  06/20/18 97.9 F (36.6 C)   BP Readings from Last 3 Encounters:  08/12/18 (!) 129/48  07/26/18 126/60  07/02/18 132/80   Pulse Readings from Last 3 Encounters:  08/12/18 68  07/26/18 61  06/20/18 64   Constitutional: Well-developed, well-nourished, and in no distress.   HENT: Head: Normocephalic and atraumatic.  Mouth/Throat: No oropharyngeal exudate. Mucosa moist. Eyes: Pupils are equal, round, and reactive to light. Conjunctivae are normal. No scleral icterus.  Neck: Normal range of motion. Neck supple. No JVD present.  Cardiovascular: Normal rate, regular rhythm and normal heart sounds.  Exam reveals no gallop and no friction rub.   No murmur heard. Pulmonary/Chest: Effort normal and breath sounds normal. No respiratory distress. No wheezes.No rales.  Abdominal: Soft. Bowel sounds are normal. No distension. There is no  tenderness. There is no guarding.  Musculoskeletal: No edema or tenderness.  Lymphadenopathy: No cervical, axillary or supraclavicular adenopathy.  Neurological: Alert and oriented to person, place, and time. No cranial nerve deficit.  Skin: Skin is warm and dry. No rash noted. No erythema. No pallor.  Psychiatric: Affect and judgment normal.   Labs Appointment on 08/10/2018  Component Date Value Ref Range Status  . WBC 08/10/2018 10.4  4.0 - 10.5 K/uL Final  . RBC 08/10/2018 3.69* 3.87 - 5.11 MIL/uL Final  . Hemoglobin 08/10/2018 9.9* 12.0 - 15.0 g/dL Final  . HCT 08/10/2018 32.5* 36.0 - 46.0 % Final  . MCV 08/10/2018 88.1  80.0 - 100.0 fL Final  . MCH 08/10/2018 26.8  26.0 - 34.0 pg Final  . MCHC 08/10/2018 30.5  30.0 - 36.0 g/dL Final  . RDW 08/10/2018 14.2  11.5 - 15.5 % Final  . Platelets 08/10/2018 234  150 - 400 K/uL Final  . nRBC 08/10/2018 0.0  0.0 - 0.2 % Final  . Neutrophils Relative % 08/10/2018 75  % Final  . Neutro Abs 08/10/2018 7.9* 1.7 - 7.7 K/uL Final  . Lymphocytes Relative 08/10/2018 17  % Final  . Lymphs Abs 08/10/2018 1.8   0.7 - 4.0 K/uL Final  . Monocytes Relative 08/10/2018 6  % Final  . Monocytes Absolute 08/10/2018 0.6  0.1 - 1.0 K/uL Final  . Eosinophils Relative 08/10/2018 1  % Final  . Eosinophils Absolute 08/10/2018 0.1  0.0 - 0.5 K/uL Final  . Basophils Relative 08/10/2018 1  % Final  . Basophils Absolute 08/10/2018 0.1  0.0 - 0.1 K/uL Final  . Immature Granulocytes 08/10/2018 0  % Final  . Abs Immature Granulocytes 08/10/2018 0.03  0.00 - 0.07 K/uL Final   Performed at [facility], [address] [place], [place] [address]  . Sodium 08/10/2018 138  135 - 145 mmol/L Final  . Potassium 08/10/2018 3.6  3.5 - 5.1 mmol/L Final  . Chloride 08/10/2018 107  98 - 111 mmol/L Final  . CO2 08/10/2018 22  22 - 32 mmol/L Final  . Glucose, Bld 08/10/2018 98  70 - 99 mg/dL Final  . BUN 08/10/2018 20  8 - 23 mg/dL Final  . Creatinine, Ser 08/10/2018 0.77  0.44 - 1.00 mg/dL Final  . Calcium 08/10/2018 8.3* 8.9 - 10.3 mg/dL Final  . Total Protein 08/10/2018 6.9  6.5 - 8.1 g/dL Final  . Albumin 08/10/2018 3.9  3.5 - 5.0 g/dL Final  . AST 08/10/2018 15  15 - 41 U/L Final  . ALT 08/10/2018 17  0 - 44 U/L Final  . Alkaline Phosphatase 08/10/2018 92  38 - 126 U/L Final  . Total Bilirubin 08/10/2018 0.5  0.3 - 1.2 mg/dL Final  . GFR calc non Af Amer 08/10/2018 >60  >60 mL/min Final  . GFR calc Af Amer 08/10/2018 >60  >60 mL/min Final  . Anion gap 08/10/2018 9  5 - 15 Final   Performed at [facility], [address], [place], [place] [address]  . LDH 08/10/2018 126  98 - 192 U/L Final   Performed at [facility], [address], [place], [place] [address]  . Ferritin 08/10/2018 14  11 - 307 ng/mL Final   Performed at [facility], [address] [place], [place] [address]     Pathology Orders Placed This Encounter  Procedures  . CBC with Differential/Platelet    Standing Status:   Future    Standing Expiration Date:  08/13/2019  . Comprehensive metabolic panel    Standing Status:   Future    Standing  Expiration Date:   08/13/2019  . Lactate dehydrogenase    Standing Status:   Future    Standing Expiration Date:   08/13/2019  . Ferritin    Standing Status:   Future    Standing Expiration Date:   08/13/2019  . Protein electrophoresis, serum    Standing Status:   Future    Standing Expiration Date:   08/13/2019  . Vitamin B12    Standing Status:   Future    Standing Expiration Date:   08/13/2019  . Folate    Standing Status:   Future    Standing Expiration Date:   08/13/2019  . Methylmalonic acid, serum    Standing Status:   Future    Standing Expiration Date:   08/13/2019       Zoila Shutter MD

## 2018-08-19 ENCOUNTER — Inpatient Hospital Stay (HOSPITAL_COMMUNITY): Payer: Medicare Other

## 2018-08-19 ENCOUNTER — Encounter (HOSPITAL_COMMUNITY): Payer: Self-pay

## 2018-08-19 ENCOUNTER — Other Ambulatory Visit: Payer: Self-pay

## 2018-08-19 VITALS — BP 117/55 | HR 66 | Temp 97.8°F | Resp 18

## 2018-08-19 DIAGNOSIS — Z90722 Acquired absence of ovaries, bilateral: Secondary | ICD-10-CM | POA: Diagnosis not present

## 2018-08-19 DIAGNOSIS — D500 Iron deficiency anemia secondary to blood loss (chronic): Secondary | ICD-10-CM

## 2018-08-19 DIAGNOSIS — E041 Nontoxic single thyroid nodule: Secondary | ICD-10-CM | POA: Diagnosis not present

## 2018-08-19 DIAGNOSIS — D508 Other iron deficiency anemias: Secondary | ICD-10-CM | POA: Diagnosis not present

## 2018-08-19 DIAGNOSIS — E538 Deficiency of other specified B group vitamins: Secondary | ICD-10-CM | POA: Diagnosis not present

## 2018-08-19 DIAGNOSIS — K909 Intestinal malabsorption, unspecified: Secondary | ICD-10-CM | POA: Diagnosis not present

## 2018-08-19 DIAGNOSIS — Z90710 Acquired absence of both cervix and uterus: Secondary | ICD-10-CM | POA: Diagnosis not present

## 2018-08-19 MED ORDER — SODIUM CHLORIDE 0.9% FLUSH
10.0000 mL | Freq: Once | INTRAVENOUS | Status: AC | PRN
  Administered 2018-08-19: 10 mL

## 2018-08-19 MED ORDER — SODIUM CHLORIDE 0.9 % IV SOLN
510.0000 mg | Freq: Once | INTRAVENOUS | Status: AC
  Administered 2018-08-19: 510 mg via INTRAVENOUS
  Filled 2018-08-19: qty 17

## 2018-08-19 MED ORDER — SODIUM CHLORIDE 0.9 % IV SOLN
Freq: Once | INTRAVENOUS | Status: AC
  Administered 2018-08-19: 14:00:00 via INTRAVENOUS

## 2018-08-19 MED ORDER — OCTREOTIDE ACETATE 30 MG IM KIT
PACK | INTRAMUSCULAR | Status: AC
  Filled 2018-08-19: qty 1

## 2018-08-19 NOTE — Patient Instructions (Signed)
[facility]  Discharge Instructions:   _______________________________________________________________  Thank you for choosing [facility] at [facility] to provide your oncology and hematology care.  To afford each patient quality time with our providers, please arrive at least 15 minutes before your scheduled appointment.  You need to re-schedule your appointment if you arrive 10 or more minutes late.  We strive to give you quality time with our providers, and arriving late affects you and other patients whose appointments are after yours.  Also, if you no show three or more times for appointments you may be dismissed from the clinic.  Again, thank you for choosing [facility] at [facility]. Our hope is that these requests will allow you access to exceptional care and in a timely manner. _______________________________________________________________  If you have questions after your visit, please contact our office at [phone] between the hours of 8:30 a.m. and 5:00 p.m. Voicemails left after 4:30 p.m. will not be returned until the following business day. _______________________________________________________________  For prescription refill requests, have your pharmacy contact our office. _______________________________________________________________  Recommendations made by the consultant and any test results will be sent to your referring physician. _______________________________________________________________ 

## 2018-08-19 NOTE — Progress Notes (Signed)
Pt presents today for Feraheme infusion. VSS. MAR reviewed. Pt has no complaints of any pain or changes since the last visit.   Feraheme infusion administered today per MD orders. Tolerated infusion without adverse affects. Vital signs stable. No complaints at this time. Discharged from clinic ambulatory. F/U with [facility] as scheduled.

## 2018-08-26 ENCOUNTER — Encounter (HOSPITAL_COMMUNITY): Payer: Self-pay

## 2018-08-26 ENCOUNTER — Inpatient Hospital Stay (HOSPITAL_COMMUNITY): Payer: Medicare Other

## 2018-08-26 VITALS — BP 112/56 | HR 63 | Temp 97.9°F | Resp 18

## 2018-08-26 DIAGNOSIS — Z90722 Acquired absence of ovaries, bilateral: Secondary | ICD-10-CM | POA: Diagnosis not present

## 2018-08-26 DIAGNOSIS — E041 Nontoxic single thyroid nodule: Secondary | ICD-10-CM | POA: Diagnosis not present

## 2018-08-26 DIAGNOSIS — K909 Intestinal malabsorption, unspecified: Secondary | ICD-10-CM | POA: Diagnosis not present

## 2018-08-26 DIAGNOSIS — D508 Other iron deficiency anemias: Secondary | ICD-10-CM | POA: Diagnosis not present

## 2018-08-26 DIAGNOSIS — E538 Deficiency of other specified B group vitamins: Secondary | ICD-10-CM | POA: Diagnosis not present

## 2018-08-26 DIAGNOSIS — Z90710 Acquired absence of both cervix and uterus: Secondary | ICD-10-CM | POA: Diagnosis not present

## 2018-08-26 DIAGNOSIS — D500 Iron deficiency anemia secondary to blood loss (chronic): Secondary | ICD-10-CM

## 2018-08-26 MED ORDER — SODIUM CHLORIDE 0.9 % IV SOLN
510.0000 mg | Freq: Once | INTRAVENOUS | Status: AC
  Administered 2018-08-26: 510 mg via INTRAVENOUS
  Filled 2018-08-26: qty 510

## 2018-08-26 MED ORDER — SODIUM CHLORIDE 0.9% FLUSH
10.0000 mL | Freq: Once | INTRAVENOUS | Status: AC | PRN
  Administered 2018-08-26: 10 mL

## 2018-08-26 MED ORDER — SODIUM CHLORIDE 0.9 % IV SOLN
Freq: Once | INTRAVENOUS | Status: AC
  Administered 2018-08-26: 14:00:00 via INTRAVENOUS

## 2018-08-26 NOTE — Progress Notes (Signed)
Patient tolerated iron infusion with no complaints voiced.  Peripheral IV site clean and dry with good blood return noted.  Band aid applied.  VSS with discharge and left ambulatory with no s/s of distress noted.

## 2018-08-26 NOTE — Patient Instructions (Signed)
[facility] at [facility]  Discharge Instructions:   _______________________________________________________________  Thank you for choosing [facility] at [facility] to provide your oncology and hematology care.  To afford each patient quality time with our providers, please arrive at least 15 minutes before your scheduled appointment.  You need to re-schedule your appointment if you arrive 10 or more minutes late.  We strive to give you quality time with our providers, and arriving late affects you and other patients whose appointments are after yours.  Also, if you no show three or more times for appointments you may be dismissed from the clinic.  Again, thank you for choosing [facility] at [facility]. Our hope is that these requests will allow you access to exceptional care and in a timely manner. _______________________________________________________________  If you have questions after your visit, please contact our office at [phone] between the hours of 8:30 a.m. and 5:00 p.m. Voicemails left after 4:30 p.m. will not be returned until the following business day. _______________________________________________________________  For prescription refill requests, have your pharmacy contact our office. _______________________________________________________________  Recommendations made by the consultant and any test results will be sent to your referring physician. _______________________________________________________________ 

## 2018-09-11 ENCOUNTER — Ambulatory Visit (INDEPENDENT_AMBULATORY_CARE_PROVIDER_SITE_OTHER): Payer: Medicare Other

## 2018-09-11 DIAGNOSIS — E538 Deficiency of other specified B group vitamins: Secondary | ICD-10-CM | POA: Diagnosis not present

## 2018-09-11 MED ORDER — CYANOCOBALAMIN 1000 MCG/ML IJ SOLN
1000.0000 ug | Freq: Once | INTRAMUSCULAR | Status: AC
  Administered 2018-09-11: 1000 ug via INTRAMUSCULAR

## 2018-09-16 DIAGNOSIS — H5203 Hypermetropia, bilateral: Secondary | ICD-10-CM | POA: Diagnosis not present

## 2018-09-16 DIAGNOSIS — H524 Presbyopia: Secondary | ICD-10-CM | POA: Diagnosis not present

## 2018-09-16 DIAGNOSIS — H52223 Regular astigmatism, bilateral: Secondary | ICD-10-CM | POA: Diagnosis not present

## 2018-09-16 DIAGNOSIS — H43813 Vitreous degeneration, bilateral: Secondary | ICD-10-CM | POA: Diagnosis not present

## 2018-10-13 ENCOUNTER — Ambulatory Visit: Payer: Medicare Other

## 2018-10-14 ENCOUNTER — Ambulatory Visit: Payer: Medicare Other | Admitting: Nurse Practitioner

## 2018-10-14 ENCOUNTER — Other Ambulatory Visit: Payer: Self-pay

## 2018-10-14 ENCOUNTER — Ambulatory Visit (INDEPENDENT_AMBULATORY_CARE_PROVIDER_SITE_OTHER): Payer: Medicare Other | Admitting: *Deleted

## 2018-10-14 ENCOUNTER — Encounter

## 2018-10-14 DIAGNOSIS — E538 Deficiency of other specified B group vitamins: Secondary | ICD-10-CM

## 2018-10-14 MED ORDER — CYANOCOBALAMIN 1000 MCG/ML IJ SOLN
1000.0000 ug | Freq: Once | INTRAMUSCULAR | Status: AC
  Administered 2018-10-14: 1000 ug via INTRAMUSCULAR

## 2018-10-20 ENCOUNTER — Inpatient Hospital Stay (HOSPITAL_COMMUNITY): Payer: Medicare Other | Attending: Hematology

## 2018-10-20 ENCOUNTER — Other Ambulatory Visit: Payer: Self-pay

## 2018-10-20 DIAGNOSIS — Z90710 Acquired absence of both cervix and uterus: Secondary | ICD-10-CM | POA: Diagnosis not present

## 2018-10-20 DIAGNOSIS — E538 Deficiency of other specified B group vitamins: Secondary | ICD-10-CM | POA: Diagnosis not present

## 2018-10-20 DIAGNOSIS — Z79899 Other long term (current) drug therapy: Secondary | ICD-10-CM | POA: Diagnosis not present

## 2018-10-20 DIAGNOSIS — E041 Nontoxic single thyroid nodule: Secondary | ICD-10-CM | POA: Insufficient documentation

## 2018-10-20 DIAGNOSIS — D508 Other iron deficiency anemias: Secondary | ICD-10-CM | POA: Insufficient documentation

## 2018-10-20 DIAGNOSIS — K909 Intestinal malabsorption, unspecified: Secondary | ICD-10-CM | POA: Insufficient documentation

## 2018-10-20 DIAGNOSIS — D500 Iron deficiency anemia secondary to blood loss (chronic): Secondary | ICD-10-CM

## 2018-10-20 DIAGNOSIS — Z90722 Acquired absence of ovaries, bilateral: Secondary | ICD-10-CM | POA: Insufficient documentation

## 2018-10-20 LAB — COMPREHENSIVE METABOLIC PANEL
ALT: 12 U/L (ref 0–44)
AST: 11 U/L — ABNORMAL LOW (ref 15–41)
Albumin: 3.7 g/dL (ref 3.5–5.0)
Alkaline Phosphatase: 103 U/L (ref 38–126)
Anion gap: 8 (ref 5–15)
BUN: 19 mg/dL (ref 8–23)
CO2: 22 mmol/L (ref 22–32)
Calcium: 8.6 mg/dL — ABNORMAL LOW (ref 8.9–10.3)
Chloride: 109 mmol/L (ref 98–111)
Creatinine, Ser: 0.62 mg/dL (ref 0.44–1.00)
GFR calc Af Amer: >60 mL/min (ref >60)
GFR calc non Af Amer: >60 mL/min (ref >60)
Glucose, Bld: 105 mg/dL — ABNORMAL HIGH (ref 70–99)
Potassium: 4 mmol/L (ref 3.5–5.1)
Sodium: 139 mmol/L (ref 135–145)
TOTAL PROTEIN: 7.1 g/dL (ref 6.5–8.1)
Total Bilirubin: <0.1 mg/dL — ABNORMAL LOW (ref 0.3–1.2)

## 2018-10-20 LAB — CBC WITH DIFFERENTIAL/PLATELET
Abs Immature Granulocytes: 0.04 10*3/uL (ref 0.00–0.07)
Basophils Absolute: 0.1 10*3/uL (ref 0.0–0.1)
Basophils Relative: 1 %
Eosinophils Absolute: 0.1 10*3/uL (ref 0.0–0.5)
Eosinophils Relative: 1 %
HEMATOCRIT: 29.5 % — AB (ref 36.0–46.0)
Hemoglobin: 8.8 g/dL — ABNORMAL LOW (ref 12.0–15.0)
Immature Granulocytes: 1 %
LYMPHS ABS: 1.8 10*3/uL (ref 0.7–4.0)
Lymphocytes Relative: 22 %
MCH: 25.4 pg — ABNORMAL LOW (ref 26.0–34.0)
MCHC: 29.8 g/dL — ABNORMAL LOW (ref 30.0–36.0)
MCV: 85.3 fL (ref 80.0–100.0)
Monocytes Absolute: 0.6 10*3/uL (ref 0.1–1.0)
Monocytes Relative: 7 %
Neutro Abs: 5.7 10*3/uL (ref 1.7–7.7)
Neutrophils Relative %: 68 %
Platelets: 283 10*3/uL (ref 150–400)
RBC: 3.46 MIL/uL — ABNORMAL LOW (ref 3.87–5.11)
RDW: 14.4 % (ref 11.5–15.5)
WBC: 8.2 10*3/uL (ref 4.0–10.5)
nRBC: 0 % (ref 0.0–0.2)

## 2018-10-20 LAB — FOLATE: Folate: 8 ng/mL (ref >5.9)

## 2018-10-20 LAB — VITAMIN B12: Vitamin B-12: 339 pg/mL (ref 180–914)

## 2018-10-20 LAB — LACTATE DEHYDROGENASE: LDH: 98 U/L (ref 98–192)

## 2018-10-20 LAB — FERRITIN: Ferritin: 8 ng/mL — ABNORMAL LOW (ref 11–307)

## 2018-10-21 ENCOUNTER — Other Ambulatory Visit (HOSPITAL_COMMUNITY): Payer: Self-pay

## 2018-10-21 LAB — PROTEIN ELECTROPHORESIS, SERUM
A/G RATIO SPE: 1.2 (ref 0.7–1.7)
Albumin ELP: 3.5 g/dL (ref 2.9–4.4)
Alpha-1-Globulin: 0.3 g/dL (ref 0.0–0.4)
Alpha-2-Globulin: 0.8 g/dL (ref 0.4–1.0)
Beta Globulin: 1.1 g/dL (ref 0.7–1.3)
Gamma Globulin: 0.9 g/dL (ref 0.4–1.8)
Globulin, Total: 3 g/dL (ref 2.2–3.9)
Total Protein ELP: 6.5 g/dL (ref 6.0–8.5)

## 2018-10-22 LAB — METHYLMALONIC ACID, SERUM: METHYLMALONIC ACID, QUANTITATIVE: 146 nmol/L (ref 0–378)

## 2018-10-28 ENCOUNTER — Ambulatory Visit (HOSPITAL_COMMUNITY): Payer: Self-pay | Admitting: Hematology

## 2018-10-28 ENCOUNTER — Other Ambulatory Visit: Payer: Self-pay

## 2018-10-28 ENCOUNTER — Inpatient Hospital Stay (HOSPITAL_COMMUNITY): Payer: Medicare Other | Attending: Hematology | Admitting: Nurse Practitioner

## 2018-10-28 DIAGNOSIS — E041 Nontoxic single thyroid nodule: Secondary | ICD-10-CM | POA: Diagnosis not present

## 2018-10-28 DIAGNOSIS — K5090 Crohn's disease, unspecified, without complications: Secondary | ICD-10-CM

## 2018-10-28 DIAGNOSIS — D508 Other iron deficiency anemias: Secondary | ICD-10-CM | POA: Diagnosis not present

## 2018-10-28 DIAGNOSIS — Z79899 Other long term (current) drug therapy: Secondary | ICD-10-CM | POA: Diagnosis not present

## 2018-10-28 DIAGNOSIS — E538 Deficiency of other specified B group vitamins: Secondary | ICD-10-CM | POA: Diagnosis not present

## 2018-10-28 DIAGNOSIS — D500 Iron deficiency anemia secondary to blood loss (chronic): Secondary | ICD-10-CM

## 2018-10-28 NOTE — Progress Notes (Signed)
[place], [place] [id number]   CLINIC:  Medical Oncology/Hematology  PCP:  Kathyrn Drown, MD [address] [place] [address] [phone]   REASON FOR VISIT: Follow-up for iron deficiency anemia  CURRENT THERAPY: Intermittent iron infusions   INTERVAL HISTORY:  Ms. Huebsch 68 y.o. female returns for routine follow-up for iron deficiency anemia.  She is here today alone.  She is experiencing more fatigue.  She reports she has getting more frequent iron infusions but they do not feel like they are helping like they used to.  She missed her appointment to have her set up for a colonoscopy.  She wants to wait 2 months after she gets iron and then follow-up with GI.  She denies any bleeding per rectum or melena. Denies any nausea, vomiting, or diarrhea. Denies any new pains. Had not noticed any recent bleeding such as epistaxis, hematuria or hematochezia. Denies recent chest pain on exertion, shortness of breath on minimal exertion, pre-syncopal episodes, or palpitations. Denies any numbness or tingling in hands or feet. Denies any recent fevers, infections, or recent hospitalizations. Patient reports appetite at 100% and energy level at 50%.  She is eating well and maintaining her weight at this time.     REVIEW OF SYSTEMS:  Review of Systems  Constitutional: Positive for fatigue.  All other systems reviewed and are negative.    PAST MEDICAL/SURGICAL HISTORY:  Past Medical History:  Diagnosis Date  . Arthritis   . B12 deficiency 07/09/2015  . Crohn disease ([place]) JULY 2011 FEDA   PERSISTENT TI ULCERS seen on CE, NO NSAID USE  . Endometriosis    HISTORY  LEADING TO HYSTERECTOMY  . Fibromyalgia   . Fibromyalgia 12/09/2013  . Gastric ulcer   . GERD (gastroesophageal reflux disease)   . Headache   . Helicobacter pylori gastritis    HISTORY  . History of hiatal hernia   . Hives   . Iron (Fe) deficiency anemia 2009   JAN 2012  NL HB & FERRITIN  . Obesity   . Osteoporosis 11/20/2014  . Plantar fasciitis    Past Surgical History:  Procedure Laterality Date  . ABDOMINAL HYSTERECTOMY    . CATARACT EXTRACTION W/PHACO Right 12/05/2015   Procedure: CATARACT EXTRACTION PHACO AND INTRAOCULAR LENS PLACEMENT (IOC);  Surgeon: Birder Robson, MD;  Location: [facility] ORS;  Service: Ophthalmology;  Laterality: Right;  [place] 00:59.9 AP% 21.9 CDE 13.10 fluid pack lot # [id number]  . CATARACT EXTRACTION W/PHACO Left 06/24/2016   Procedure: CATARACT EXTRACTION PHACO AND INTRAOCULAR LENS PLACEMENT LEFT EYE; CDE:  8.33;  Surgeon: Tonny Branch, MD;  Location: [facility];  Service: Ophthalmology;  Laterality: Left;  . COLONOSCOPY    . ESOPHAGOGASTRODUODENOSCOPY  10/14/2007   Esophagus showed 2 cm pink tongue seen extending from the GE junction.  Biopsies obtained via cold forceps.  Otherwise the  esophagus was without erosions, mass, ulceration, or stricture / . Large hiatal hernia pouch with linear erosions, and two 3-6 mmulcers seen in the pouch.  Biopsies obtained via cold forceps to  evaluate for H. pylori gastritis or malignancy  . Ileocolonoscopy  10/14/2007   Multiple 1-3 mm terminal ileum ulcers biopsied  The ulcers seemed to only involve the last 5 cm of her terminal ileum, and 10 cm of her ileum was visualized.  Otherwise the colon  was without polyps, masses, diverticula, or AVMs.  She had a normal retroflexed view of the rectum  . OVARIAN  CYST REMOVAL    . TOTAL ABDOMINAL HYSTERECTOMY W/ BILATERAL SALPINGOOPHORECTOMY    . UPPER GASTROINTESTINAL ENDOSCOPY  JULY 2011 DIARRHEA/FEDA    Bx-H.PYLORI GASTRITIS, NL DUODENUM     SOCIAL HISTORY:  Social History   Socioeconomic History  . Marital status: Married    Spouse name: Alvester Chou   . Number of children: 3  . Years of education: 42  . Highest education level: Not on file  Occupational History  . Occupation: Retired  Scientific laboratory technician  . Financial resource strain: Not on file  . Food  insecurity:    Worry: Not on file    Inability: Not on file  . Transportation needs:    Medical: Not on file    Non-medical: Not on file  Tobacco Use  . Smoking status: Never Smoker  . Smokeless tobacco: Never Used  Substance and Sexual Activity  . Alcohol use: No  . Drug use: No  . Sexual activity: Yes    Birth control/protection: Surgical  Lifestyle  . Physical activity:    Days per week: Not on file    Minutes per session: Not on file  . Stress: Not on file  Relationships  . Social connections:    Talks on phone: Not on file    Gets together: Not on file    Attends religious service: Not on file    Active member of club or organization: Not on file    Attends meetings of clubs or organizations: Not on file    Relationship status: Not on file  . Intimate partner violence:    Fear of current or ex partner: Not on file    Emotionally abused: Not on file    Physically abused: Not on file    Forced sexual activity: Not on file  Other Topics Concern  . Not on file  Social History Narrative   Lives w/ husband   Caffeine use: Drinks 1-2 cups coffee per day    FAMILY HISTORY:  Family History  Problem Relation Age of Onset  . Diabetes Mother   . Cancer Father   . Diabetes Father   . Diabetes Sister   . Diabetes Brother   . Colon polyps Neg Hx   . Colon cancer Neg Hx   . Stroke Neg Hx     CURRENT MEDICATIONS:  Outpatient Encounter Medications as of 10/28/2018  Medication Sig  . acetaminophen (TYLENOL) 325 MG tablet Take 650 mg by mouth as needed for moderate pain or headache.   . [DISCONTINUED] albuterol (PROVENTIL HFA) 108 (90 Base) MCG/ACT inhaler Inhale 2 puffs into the lungs every 4 (four) hours as needed for wheezing or shortness of breath. (Patient not taking: Reported on 10/28/2018)   No facility-administered encounter medications on file as of 10/28/2018.     ALLERGIES:  Allergies  Allergen Reactions  . Cefzil [Cefprozil] Rash  . Celebrex [Celecoxib] Hives  and Itching  . Sulfonamide Derivatives Itching     PHYSICAL EXAM:  ECOG Performance status: 1  Vitals:   10/28/18 0957  BP: (!) 119/42  Pulse: 79  Resp: 18  Temp: (!) 96.9 F (36.1 C)  SpO2: 98%   Filed Weights   10/28/18 0957  Weight: 195 lb 14.4 oz (88.9 kg)    Physical Exam Constitutional:      Appearance: Normal appearance. She is normal weight.  Cardiovascular:     Rate and Rhythm: Normal rate and regular rhythm.     Heart sounds: Normal heart sounds.  Pulmonary:  Effort: Pulmonary effort is normal.     Breath sounds: Normal breath sounds.  Abdominal:     General: Bowel sounds are normal.     Palpations: Abdomen is soft.  Musculoskeletal: Normal range of motion.  Skin:    General: Skin is warm and dry.  Neurological:     Mental Status: She is alert and oriented to person, place, and time. Mental status is at baseline.  Psychiatric:        Mood and Affect: Mood normal.        Behavior: Behavior normal.        Thought Content: Thought content normal.        Judgment: Judgment normal.      LABORATORY DATA:  I have reviewed the labs as listed.  CBC    Component Value Date/Time   WBC 8.2 10/20/2018 1534   RBC 3.46 (L) 10/20/2018 1534   HGB 8.8 (L) 10/20/2018 1534   HCT 29.5 (L) 10/20/2018 1534   PLT 283 10/20/2018 1534   MCV 85.3 10/20/2018 1534   MCH 25.4 (L) 10/20/2018 1534   MCHC 29.8 (L) 10/20/2018 1534   RDW 14.4 10/20/2018 1534   LYMPHSABS 1.8 10/20/2018 1534   MONOABS 0.6 10/20/2018 1534   EOSABS 0.1 10/20/2018 1534   BASOSABS 0.1 10/20/2018 1534   CMP Latest Ref Rng & Units 10/20/2018 08/10/2018 05/27/2018  Glucose 70 - 99 mg/dL 105(H) 98 103(H)  BUN 8 - 23 mg/dL 19 20 16   Creatinine 0.44 - 1.00 mg/dL 0.62 0.77 0.61  Sodium 135 - 145 mmol/L 139 138 137  Potassium 3.5 - 5.1 mmol/L 4.0 3.6 3.8  Chloride 98 - 111 mmol/L 109 107 107  CO2 22 - 32 mmol/L 22 22 22   Calcium 8.9 - 10.3 mg/dL 8.6(L) 8.3(L) 8.6(L)  Total Protein 6.5 - 8.1 g/dL  7.1 6.9 7.2  Total Bilirubin 0.3 - 1.2 mg/dL <0.1(L) 0.5 0.3  Alkaline Phos 38 - 126 U/L 103 92 110  AST 15 - 41 U/L 11(L) 15 14(L)  ALT 0 - 44 U/L 12 17 15       I personally performed a face-to-face visit.    ASSESSMENT & PLAN:   Iron deficiency anemia 1.  Iron deficiency anemia: - She has felt to have anemia due to chronic blood loss and malabsorption.   - She requires intermittent IV iron infusions.  Her last infusions were 08/19/2018 and 08/26/2018. - Labs done on 10/20/2018 were hemoglobin 8.8 and ferritin 8. - We will set her up with 2 more infusions of Feraheme. - She will follow-up in 2 months with repeat labs.  2.  B12 deficiency: - She is on B12 injections monthly. - Labs done on 10/20/2018 showed vitamin B12 level at 339.  Methylmalonic acid was 146. -We recommend she continue on the B12 replacement at this time. - We will repeat labs when she follows up in 2 months.  3.  Crohn's disease: - She is closely followed by GI due to her recurrent IDA. - She missed her recent follow-up with her GI doctor. -She is due for another colonoscopy she wishes to wait a couple months and she will ask for a referral.  4.  Thyroid nodule: - On 08/2017 a thyroid nodule was found by ultrasound. - A biopsy was done 09/2017 that showed benign nodule. - Patient should follow-up with PCP or endocrinology as directed.  5.  Health maintenance: - Last colonoscopy was done 02/22/2010.  She is recommended to follow-up with GI. -  Her last mammogram was in 2006.  It is recommended to schedule a mammogram.      Orders placed this encounter:  Orders Placed This Encounter  Procedures  . CBC with Differential/Platelet  . Comprehensive metabolic panel  . Ferritin  . Iron and TIBC  . Vitamin B12  . VITAMIN D 25 Hydroxy (Vit-D Deficiency, Fractures)  . Folate  . Lactate dehydrogenase      Natale Lay, FNP-C [place] [phone]

## 2018-10-28 NOTE — Patient Instructions (Addendum)
[place] at [facility]  Discharge Instructions: Follow up in 2 months with labs. WE will set you up with 2 infusions of iron.   You saw Francene Finders today. _______________________________________________________________  Thank you for choosing [place] at [facility] to provide your oncology and hematology care.  To afford each patient quality time with our providers, please arrive at least 15 minutes before your scheduled appointment.  You need to re-schedule your appointment if you arrive 10 or more minutes late.  We strive to give you quality time with our providers, and arriving late affects you and other patients whose appointments are after yours.  Also, if you no show three or more times for appointments you may be dismissed from the clinic.  Again, thank you for choosing [place] at [place] hope is that these requests will allow you access to exceptional care and in a timely manner. _______________________________________________________________  If you have questions after your visit, please contact our office at [phone] between the hours of 8:30 a.m. and 5:00 p.m. Voicemails left after 4:30 p.m. will not be returned until the following business day. _______________________________________________________________  For prescription refill requests, have your pharmacy contact our office. _______________________________________________________________  Recommendations made by the consultant and any test results will be sent to your referring physician. _______________________________________________________________

## 2018-10-28 NOTE — Assessment & Plan Note (Addendum)
1.  Iron deficiency anemia: - She has felt to have anemia due to chronic blood loss and malabsorption.   - She requires intermittent IV iron infusions.  Her last infusions were 08/19/2018 and 08/26/2018. - Labs done on 10/20/2018 were hemoglobin 8.8 and ferritin 8. - We will set her up with 2 more infusions of Feraheme. - She will follow-up in 2 months with repeat labs.  2.  B12 deficiency: - She is on B12 injections monthly. - Labs done on 10/20/2018 showed vitamin B12 level at 339.  Methylmalonic acid was 146. -We recommend she continue on the B12 replacement at this time. - We will repeat labs when she follows up in 2 months.  3.  Crohn's disease: - She is closely followed by GI due to her recurrent IDA. - She missed her recent follow-up with her GI doctor. -She is due for another colonoscopy she wishes to wait a couple months and she will ask for a referral.  4.  Thyroid nodule: - On 08/2017 a thyroid nodule was found by ultrasound. - A biopsy was done 09/2017 that showed benign nodule. - Patient should follow-up with PCP or endocrinology as directed.  5.  Health maintenance: - Last colonoscopy was done 02/22/2010.  She is recommended to follow-up with GI. - Her last mammogram was in 2006.  It is recommended to schedule a mammogram.

## 2018-11-10 ENCOUNTER — Other Ambulatory Visit: Payer: Self-pay

## 2018-11-10 ENCOUNTER — Encounter (HOSPITAL_COMMUNITY): Payer: Self-pay

## 2018-11-10 ENCOUNTER — Inpatient Hospital Stay (HOSPITAL_COMMUNITY): Payer: Medicare Other

## 2018-11-10 VITALS — BP 117/42 | HR 66 | Temp 98.2°F | Resp 18

## 2018-11-10 DIAGNOSIS — Z79899 Other long term (current) drug therapy: Secondary | ICD-10-CM | POA: Diagnosis not present

## 2018-11-10 DIAGNOSIS — E041 Nontoxic single thyroid nodule: Secondary | ICD-10-CM | POA: Diagnosis not present

## 2018-11-10 DIAGNOSIS — K5090 Crohn's disease, unspecified, without complications: Secondary | ICD-10-CM | POA: Diagnosis not present

## 2018-11-10 DIAGNOSIS — D500 Iron deficiency anemia secondary to blood loss (chronic): Secondary | ICD-10-CM

## 2018-11-10 DIAGNOSIS — D508 Other iron deficiency anemias: Secondary | ICD-10-CM | POA: Diagnosis not present

## 2018-11-10 DIAGNOSIS — E538 Deficiency of other specified B group vitamins: Secondary | ICD-10-CM | POA: Diagnosis not present

## 2018-11-10 MED ORDER — SODIUM CHLORIDE 0.9 % IV SOLN
INTRAVENOUS | Status: DC
  Administered 2018-11-10: 09:00:00 via INTRAVENOUS

## 2018-11-10 MED ORDER — SODIUM CHLORIDE 0.9 % IV SOLN
510.0000 mg | Freq: Once | INTRAVENOUS | Status: AC
  Administered 2018-11-10: 510 mg via INTRAVENOUS
  Filled 2018-11-10: qty 510

## 2018-11-10 NOTE — Patient Instructions (Signed)
[facility] at [facility]  Discharge Instructions:   _______________________________________________________________  Thank you for choosing [facility] at [facility] to provide your oncology and hematology care.  To afford each patient quality time with our providers, please arrive at least 15 minutes before your scheduled appointment.  You need to re-schedule your appointment if you arrive 10 or more minutes late.  We strive to give you quality time with our providers, and arriving late affects you and other patients whose appointments are after yours.  Also, if you no show three or more times for appointments you may be dismissed from the clinic.  Again, thank you for choosing [facility] at [facility]. Our hope is that these requests will allow you access to exceptional care and in a timely manner. _______________________________________________________________  If you have questions after your visit, please contact our office at [phone] between the hours of 8:30 a.m. and 5:00 p.m. Voicemails left after 4:30 p.m. will not be returned until the following business day. _______________________________________________________________  For prescription refill requests, have your pharmacy contact our office. _______________________________________________________________  Recommendations made by the consultant and any test results will be sent to your referring physician. _______________________________________________________________ 

## 2018-11-10 NOTE — Progress Notes (Signed)
Feraheme given per orders. Patient tolerated it well without problems. Vitals stable and discharged home from clinic ambulatory. Follow up as scheduled.  

## 2018-11-17 ENCOUNTER — Inpatient Hospital Stay (HOSPITAL_COMMUNITY): Payer: Medicare Other

## 2018-11-17 ENCOUNTER — Encounter (HOSPITAL_COMMUNITY): Payer: Self-pay

## 2018-11-17 ENCOUNTER — Other Ambulatory Visit: Payer: Self-pay

## 2018-11-17 VITALS — BP 112/42 | HR 65 | Temp 98.1°F | Resp 18

## 2018-11-17 DIAGNOSIS — Z79899 Other long term (current) drug therapy: Secondary | ICD-10-CM | POA: Diagnosis not present

## 2018-11-17 DIAGNOSIS — D508 Other iron deficiency anemias: Secondary | ICD-10-CM | POA: Diagnosis not present

## 2018-11-17 DIAGNOSIS — D500 Iron deficiency anemia secondary to blood loss (chronic): Secondary | ICD-10-CM

## 2018-11-17 DIAGNOSIS — E538 Deficiency of other specified B group vitamins: Secondary | ICD-10-CM | POA: Diagnosis not present

## 2018-11-17 DIAGNOSIS — K5090 Crohn's disease, unspecified, without complications: Secondary | ICD-10-CM | POA: Diagnosis not present

## 2018-11-17 DIAGNOSIS — E041 Nontoxic single thyroid nodule: Secondary | ICD-10-CM | POA: Diagnosis not present

## 2018-11-17 MED ORDER — SODIUM CHLORIDE 0.9 % IV SOLN
INTRAVENOUS | Status: AC
  Administered 2018-11-17: 09:00:00 via INTRAVENOUS

## 2018-11-17 MED ORDER — SODIUM CHLORIDE 0.9 % IV SOLN
510.0000 mg | Freq: Once | INTRAVENOUS | Status: AC
  Administered 2018-11-17: 510 mg via INTRAVENOUS
  Filled 2018-11-17: qty 510

## 2018-11-17 NOTE — Patient Instructions (Signed)
[facility] at [facility]  Discharge Instructions:   _______________________________________________________________  Thank you for choosing [facility] at [facility] to provide your oncology and hematology care.  To afford each patient quality time with our providers, please arrive at least 15 minutes before your scheduled appointment.  You need to re-schedule your appointment if you arrive 10 or more minutes late.  We strive to give you quality time with our providers, and arriving late affects you and other patients whose appointments are after yours.  Also, if you no show three or more times for appointments you may be dismissed from the clinic.  Again, thank you for choosing [facility] at [facility]. Our hope is that these requests will allow you access to exceptional care and in a timely manner. _______________________________________________________________  If you have questions after your visit, please contact our office at [phone] between the hours of 8:30 a.m. and 5:00 p.m. Voicemails left after 4:30 p.m. will not be returned until the following business day. _______________________________________________________________  For prescription refill requests, have your pharmacy contact our office. _______________________________________________________________  Recommendations made by the consultant and any test results will be sent to your referring physician. _______________________________________________________________ 

## 2018-11-17 NOTE — Progress Notes (Unsigned)
Feraheme given per orders. Patient tolerated it well without problems. Vitals stable and discharged home from clinic ambulatory. Follow up as scheduled.  

## 2018-11-24 ENCOUNTER — Other Ambulatory Visit: Payer: Self-pay

## 2018-11-24 ENCOUNTER — Ambulatory Visit (INDEPENDENT_AMBULATORY_CARE_PROVIDER_SITE_OTHER): Payer: Medicare Other

## 2018-11-24 DIAGNOSIS — E538 Deficiency of other specified B group vitamins: Secondary | ICD-10-CM

## 2018-11-24 MED ORDER — CYANOCOBALAMIN 1000 MCG/ML IJ SOLN
1000.0000 ug | Freq: Once | INTRAMUSCULAR | Status: AC
  Administered 2018-11-24: 1000 ug via INTRAMUSCULAR

## 2018-12-30 ENCOUNTER — Other Ambulatory Visit: Payer: Self-pay

## 2018-12-30 ENCOUNTER — Inpatient Hospital Stay (HOSPITAL_COMMUNITY): Payer: Medicare Other | Attending: Hematology

## 2018-12-30 DIAGNOSIS — D500 Iron deficiency anemia secondary to blood loss (chronic): Secondary | ICD-10-CM

## 2018-12-30 DIAGNOSIS — D508 Other iron deficiency anemias: Secondary | ICD-10-CM | POA: Insufficient documentation

## 2018-12-30 DIAGNOSIS — E538 Deficiency of other specified B group vitamins: Secondary | ICD-10-CM | POA: Insufficient documentation

## 2018-12-30 DIAGNOSIS — E041 Nontoxic single thyroid nodule: Secondary | ICD-10-CM | POA: Diagnosis not present

## 2018-12-30 DIAGNOSIS — K5090 Crohn's disease, unspecified, without complications: Secondary | ICD-10-CM | POA: Diagnosis not present

## 2018-12-30 LAB — CBC WITH DIFFERENTIAL/PLATELET
Abs Immature Granulocytes: 0.02 10*3/uL (ref 0.00–0.07)
Basophils Absolute: 0 10*3/uL (ref 0.0–0.1)
Basophils Relative: 1 %
Eosinophils Absolute: 0.1 10*3/uL (ref 0.0–0.5)
Eosinophils Relative: 1 %
HCT: 33.9 % — ABNORMAL LOW (ref 36.0–46.0)
Hemoglobin: 10.5 g/dL — ABNORMAL LOW (ref 12.0–15.0)
Immature Granulocytes: 0 %
Lymphocytes Relative: 27 %
Lymphs Abs: 1.5 10*3/uL (ref 0.7–4.0)
MCH: 26.4 pg (ref 26.0–34.0)
MCHC: 31 g/dL (ref 30.0–36.0)
MCV: 85.2 fL (ref 80.0–100.0)
Monocytes Absolute: 0.3 10*3/uL (ref 0.1–1.0)
Monocytes Relative: 6 %
Neutro Abs: 3.6 10*3/uL (ref 1.7–7.7)
Neutrophils Relative %: 65 %
Platelets: 228 10*3/uL (ref 150–400)
RBC: 3.98 MIL/uL (ref 3.87–5.11)
RDW: 18.6 % — ABNORMAL HIGH (ref 11.5–15.5)
WBC: 5.5 10*3/uL (ref 4.0–10.5)
nRBC: 0 % (ref 0.0–0.2)

## 2018-12-30 LAB — COMPREHENSIVE METABOLIC PANEL
ALT: 14 U/L (ref 0–44)
AST: 15 U/L (ref 15–41)
Albumin: 3.6 g/dL (ref 3.5–5.0)
Alkaline Phosphatase: 87 U/L (ref 38–126)
Anion gap: 8 (ref 5–15)
BUN: 23 mg/dL (ref 8–23)
CO2: 23 mmol/L (ref 22–32)
Calcium: 8.4 mg/dL — ABNORMAL LOW (ref 8.9–10.3)
Chloride: 108 mmol/L (ref 98–111)
Creatinine, Ser: 0.63 mg/dL (ref 0.44–1.00)
GFR calc Af Amer: >60 mL/min (ref >60)
GFR calc non Af Amer: >60 mL/min (ref >60)
Glucose, Bld: 186 mg/dL — ABNORMAL HIGH (ref 70–99)
Potassium: 4.1 mmol/L (ref 3.5–5.1)
Sodium: 139 mmol/L (ref 135–145)
Total Bilirubin: 0.4 mg/dL (ref 0.3–1.2)
Total Protein: 7.1 g/dL (ref 6.5–8.1)

## 2018-12-30 LAB — IRON AND TIBC
Iron: 35 ug/dL (ref 28–170)
Saturation Ratios: 12 % (ref 10.4–31.8)
TIBC: 295 ug/dL (ref 250–450)
UIBC: 260 ug/dL

## 2018-12-30 LAB — VITAMIN B12: Vitamin B-12: 193 pg/mL (ref 180–914)

## 2018-12-30 LAB — LACTATE DEHYDROGENASE: LDH: 107 U/L (ref 98–192)

## 2018-12-30 LAB — FERRITIN: Ferritin: 21 ng/mL (ref 11–307)

## 2018-12-30 LAB — FOLATE: Folate: 14.4 ng/mL (ref >5.9)

## 2018-12-31 LAB — VITAMIN D 25 HYDROXY (VIT D DEFICIENCY, FRACTURES): Vit D, 25-Hydroxy: 11.7 ng/mL — ABNORMAL LOW (ref 30.0–100.0)

## 2019-01-05 ENCOUNTER — Other Ambulatory Visit: Payer: Self-pay

## 2019-01-06 ENCOUNTER — Encounter (HOSPITAL_COMMUNITY): Payer: Self-pay | Admitting: Nurse Practitioner

## 2019-01-06 ENCOUNTER — Inpatient Hospital Stay (HOSPITAL_BASED_OUTPATIENT_CLINIC_OR_DEPARTMENT_OTHER): Payer: Medicare Other | Admitting: Nurse Practitioner

## 2019-01-06 DIAGNOSIS — D500 Iron deficiency anemia secondary to blood loss (chronic): Secondary | ICD-10-CM

## 2019-01-06 DIAGNOSIS — K5090 Crohn's disease, unspecified, without complications: Secondary | ICD-10-CM

## 2019-01-06 DIAGNOSIS — E538 Deficiency of other specified B group vitamins: Secondary | ICD-10-CM | POA: Diagnosis not present

## 2019-01-06 DIAGNOSIS — E041 Nontoxic single thyroid nodule: Secondary | ICD-10-CM | POA: Diagnosis not present

## 2019-01-06 DIAGNOSIS — D508 Other iron deficiency anemias: Secondary | ICD-10-CM

## 2019-01-06 NOTE — Patient Instructions (Signed)
[facility] at [facility] Discharge Instructions  Follow up in 2 months with labs   Thank you for choosing [facility] at [facility] to provide your oncology and hematology care.  To afford each patient quality time with our provider, please arrive at least 15 minutes before your scheduled appointment time.   If you have a lab appointment with the Cancer Center please come in thru the  Main Entrance and check in at the main information desk  You need to re-schedule your appointment should you arrive 10 or more minutes late.  We strive to give you quality time with our providers, and arriving late affects you and other patients whose appointments are after yours.  Also, if you no show three or more times for appointments you may be dismissed from the clinic at the providers discretion.     Again, thank you for choosing [facility].  Our hope is that these requests will decrease the amount of time that you wait before being seen by our physicians.       _____________________________________________________________  Should you have questions after your visit to  Cancer Center, please contact our office at [phone] between the hours of 8:00 a.m. and 4:30 p.m.  Voicemails left after 4:00 p.m. will not be returned until the following business day.  For prescription refill requests, have your pharmacy contact our office and allow 72 hours.    Cancer Center Support Programs:   > Cancer Support Group  2nd Tuesday of the month 1pm-2pm, Journey Room    

## 2019-01-06 NOTE — Assessment & Plan Note (Addendum)
1.  Iron deficiency anemia: - She has felt to have anemia due to chronic blood loss and malabsorption.   - She requires intermittent IV iron infusions.  Her last infusions were 11/10/2018 and 11/17/2018. - Labs done on 12/30/2018 her hemoglobin 10.5, ferritin 21, percent saturation 12, and LDH 107 - We will set her up with 2 more infusions of Feraheme. - She will follow-up in 2 months with repeat labs.  2.  B12 deficiency: - She is on B12 injections monthly. - Labs done on 12/30/2018 showed vitamin B12 level at 193.  -We recommend she continue on the B12 injection replacement at this time. - We will repeat labs when she follows up in 2 months.  3.  Crohn's disease: - She is closely followed by GI due to her recurrent IDA. - She missed her recent follow-up with her GI doctor. -She is due for another colonoscopy she wishes to wait a couple months and she will ask for a referral.  4.  Thyroid nodule: - On 08/2017 a thyroid nodule was found by ultrasound. - A biopsy was done 09/2017 that showed benign nodule. - Patient should follow-up with PCP or endocrinology as directed.  5.  Health maintenance: - Last colonoscopy was done 02/22/2010.  She is recommended to follow-up with GI. - Her last mammogram was in 2006.  It is recommended to schedule a mammogram.

## 2019-01-06 NOTE — Progress Notes (Signed)
[name], [place] [address]   CLINIC:  Medical Oncology/Hematology  PCP:  Kathyrn Drown, MD [address] [place] [address] [phone]   REASON FOR VISIT: Follow-up for iron deficiency anemia  CURRENT THERAPY: Intermittent iron infusions   INTERVAL HISTORY:  [name] 68 y.o. female returns for routine follow-up for iron deficiency anemia.  She reports she is still fatigued during the day but it has improved since her last visit.  She is not working at this time so it is hard to tell due to her not moving as much.  She denies any bright red bleeding per rectum or melena.  She reports her hemorrhoids act up from time to time.  She is due for colonoscopy.  She will make her follow-up appointment with GI. Denies any nausea, vomiting, or diarrhea. Denies any new pains. Had not noticed any recent bleeding such as epistaxis, hematuria or hematochezia. Denies recent chest pain on exertion, shortness of breath on minimal exertion, pre-syncopal episodes, or palpitations. Denies any numbness or tingling in hands or feet. Denies any recent fevers, infections, or recent hospitalizations. Patient reports appetite at 100% and energy level at 50%.  She is eating well and maintaining her weight at this time.    REVIEW OF SYSTEMS:  Review of Systems  Constitutional: Positive for fatigue.  All other systems reviewed and are negative.    PAST MEDICAL/SURGICAL HISTORY:  Past Medical History:  Diagnosis Date   Arthritis    B12 deficiency 07/09/2015   Crohn disease ([place]) JULY 2011 [place] TI ULCERS seen on CE, NO NSAID USE   Endometriosis    HISTORY  LEADING TO HYSTERECTOMY   Fibromyalgia    Fibromyalgia 12/09/2013   Gastric ulcer    GERD (gastroesophageal reflux disease)    Headache    Helicobacter pylori gastritis    HISTORY   History of hiatal hernia    Hives    Iron (Fe) deficiency anemia 2009   JAN 2012 NL  HB & FERRITIN   Obesity    Osteoporosis 11/20/2014   Plantar fasciitis    Past Surgical History:  Procedure Laterality Date   ABDOMINAL HYSTERECTOMY     CATARACT EXTRACTION W/PHACO Right 12/05/2015   Procedure: CATARACT EXTRACTION PHACO AND INTRAOCULAR LENS PLACEMENT ([place]);  Surgeon: Birder Robson, MD;  Location: [facility] ORS;  Service: Ophthalmology;  Laterality: Right;  [place] 00:59.9 AP% 21.9 CDE 13.10 fluid pack lot # [id number]   CATARACT EXTRACTION W/PHACO Left 06/24/2016   Procedure: CATARACT EXTRACTION PHACO AND INTRAOCULAR LENS PLACEMENT LEFT EYE; CDE:  8.33;  Surgeon: Tonny Branch, MD;  Location: [facility];  Service: Ophthalmology;  Laterality: Left;   COLONOSCOPY     ESOPHAGOGASTRODUODENOSCOPY  10/14/2007   Esophagus showed 2 cm pink tongue seen extending from the GE junction.  Biopsies obtained via cold forceps.  Otherwise the  esophagus was without erosions, mass, ulceration, or stricture / . Large hiatal hernia pouch with linear erosions, and two 3-6 mmulcers seen in the pouch.  Biopsies obtained via cold forceps to  evaluate for H. pylori gastritis or malignancy   Ileocolonoscopy  10/14/2007   Multiple 1-3 mm terminal ileum ulcers biopsied  The ulcers seemed to only involve the last 5 cm of her terminal ileum, and 10 cm of her ileum was visualized.  Otherwise the colon  was without polyps, masses, diverticula, or AVMs.  She had a normal retroflexed view of the rectum  OVARIAN CYST REMOVAL     TOTAL ABDOMINAL HYSTERECTOMY W/ BILATERAL SALPINGOOPHORECTOMY     UPPER GASTROINTESTINAL ENDOSCOPY  JULY 2011 DIARRHEA/FEDA    Bx-H.PYLORI GASTRITIS, NL DUODENUM     SOCIAL HISTORY:  Social History   Socioeconomic History   Marital status: Married    Spouse name: Alvester Chou    Number of children: 3   Years of education: 12   Highest education level: Not on file  Occupational History   Occupation: Retired  Scientist, product/process development strain: Not on file   Food  insecurity:    Worry: Not on file    Inability: Not on Lexicographer needs:    Medical: Not on file    Non-medical: Not on file  Tobacco Use   Smoking status: Never Smoker   Smokeless tobacco: Never Used  Substance and Sexual Activity   Alcohol use: No   Drug use: No   Sexual activity: Yes    Birth control/protection: Surgical  Lifestyle   Physical activity:    Days per week: Not on file    Minutes per session: Not on file   Stress: Not on file  Relationships   Social connections:    Talks on phone: Not on file    Gets together: Not on file    Attends religious service: Not on file    Active member of club or organization: Not on file    Attends meetings of clubs or organizations: Not on file    Relationship status: Not on file   Intimate partner violence:    Fear of current or ex partner: Not on file    Emotionally abused: Not on file    Physically abused: Not on file    Forced sexual activity: Not on file  Other Topics Concern   Not on file  Social History Narrative   Lives w/ husband   Caffeine use: Drinks 1-2 cups coffee per day    FAMILY HISTORY:  Family History  Problem Relation Age of Onset   Diabetes Mother    Cancer Father    Diabetes Father    Diabetes Sister    Diabetes Brother    Colon polyps Neg Hx    Colon cancer Neg Hx    Stroke Neg Hx     CURRENT MEDICATIONS:  Outpatient Encounter Medications as of 01/06/2019  Medication Sig   acetaminophen (TYLENOL) 325 MG tablet Take 650 mg by mouth as needed for moderate pain or headache.    Facility-Administered Encounter Medications as of 01/06/2019  Medication   0.9 %  sodium chloride infusion    ALLERGIES:  Allergies  Allergen Reactions   Cefzil [Cefprozil] Rash   Celebrex [Celecoxib] Hives and Itching   Sulfonamide Derivatives Itching     PHYSICAL EXAM:  ECOG Performance status: 1  Vitals:   01/06/19 1000  BP: (!) 155/67  Pulse: 78  Resp: 16  Temp:  98.3 F (36.8 C)  SpO2: 96%   Filed Weights   01/06/19 1000  Weight: 206 lb 3 oz (93.5 kg)    Physical Exam Constitutional:      Appearance: Normal appearance. She is normal weight.  Cardiovascular:     Rate and Rhythm: Normal rate and regular rhythm.     Heart sounds: Normal heart sounds.  Pulmonary:     Effort: Pulmonary effort is normal.     Breath sounds: Normal breath sounds.  Abdominal:     General: Bowel sounds are normal.  Palpations: Abdomen is soft.  Musculoskeletal: Normal range of motion.  Skin:    General: Skin is warm and dry.  Neurological:     Mental Status: She is alert and oriented to person, place, and time. Mental status is at baseline.  Psychiatric:        Mood and Affect: Mood normal.        Behavior: Behavior normal.        Thought Content: Thought content normal.        Judgment: Judgment normal.      LABORATORY DATA:  I have reviewed the labs as listed.  CBC    Component Value Date/Time   WBC 5.5 12/30/2018 1020   RBC 3.98 12/30/2018 1020   HGB 10.5 (L) 12/30/2018 1020   HCT 33.9 (L) 12/30/2018 1020   PLT 228 12/30/2018 1020   MCV 85.2 12/30/2018 1020   MCH 26.4 12/30/2018 1020   MCHC 31.0 12/30/2018 1020   RDW 18.6 (H) 12/30/2018 1020   LYMPHSABS 1.5 12/30/2018 1020   MONOABS 0.3 12/30/2018 1020   EOSABS 0.1 12/30/2018 1020   BASOSABS 0.0 12/30/2018 1020   CMP Latest Ref Rng & Units 12/30/2018 10/20/2018 08/10/2018  Glucose 70 - 99 mg/dL 186(H) 105(H) 98  BUN 8 - 23 mg/dL 23 19 20   Creatinine 0.44 - 1.00 mg/dL 0.63 0.62 0.77  Sodium 135 - 145 mmol/L 139 139 138  Potassium 3.5 - 5.1 mmol/L 4.1 4.0 3.6  Chloride 98 - 111 mmol/L 108 109 107  CO2 22 - 32 mmol/L 23 22 22   Calcium 8.9 - 10.3 mg/dL 8.4(L) 8.6(L) 8.3(L)  Total Protein 6.5 - 8.1 g/dL 7.1 7.1 6.9  Total Bilirubin 0.3 - 1.2 mg/dL 0.4 <0.1(L) 0.5  Alkaline Phos 38 - 126 U/L 87 103 92  AST 15 - 41 U/L 15 11(L) 15  ALT 0 - 44 U/L 14 12 17     I personally performed a  face-to-face visit.  All questions were answered to patient's stated satisfaction. Encouraged patient to call with any new concerns or questions before his next visit to the cancer center and we can certain see him sooner, if needed.     ASSESSMENT & PLAN:   Iron deficiency anemia 1.  Iron deficiency anemia: - She has felt to have anemia due to chronic blood loss and malabsorption.   - She requires intermittent IV iron infusions.  Her last infusions were 11/10/2018 and 11/17/2018. - Labs done on 12/30/2018 her hemoglobin 10.5, ferritin 21, percent saturation 12, and LDH 107 - We will set her up with 2 more infusions of Feraheme. - She will follow-up in 2 months with repeat labs.  2.  B12 deficiency: - She is on B12 injections monthly. - Labs done on 12/30/2018 showed vitamin B12 level at 193.  -We recommend she continue on the B12 injection replacement at this time. - We will repeat labs when she follows up in 2 months.  3.  Crohn's disease: - She is closely followed by GI due to her recurrent IDA. - She missed her recent follow-up with her GI doctor. -She is due for another colonoscopy she wishes to wait a couple months and she will ask for a referral.  4.  Thyroid nodule: - On 08/2017 a thyroid nodule was found by ultrasound. - A biopsy was done 09/2017 that showed benign nodule. - Patient should follow-up with PCP or endocrinology as directed.  5.  Health maintenance: - Last colonoscopy was done 02/22/2010.  She  is recommended to follow-up with GI. - Her last mammogram was in 2006.  It is recommended to schedule a mammogram.      Orders placed this encounter:  Orders Placed This Encounter  Procedures   Lactate dehydrogenase   CBC with Differential/Platelet   Comprehensive metabolic panel   Ferritin   Iron and TIBC   Vitamin B12   VITAMIN D 25 Hydroxy (Vit-D Deficiency, Fractures)   Folate      Francene Finders, FNP-C [place] [phone]

## 2019-01-12 ENCOUNTER — Other Ambulatory Visit: Payer: Self-pay

## 2019-01-12 ENCOUNTER — Encounter (HOSPITAL_COMMUNITY): Payer: Self-pay

## 2019-01-12 ENCOUNTER — Inpatient Hospital Stay (HOSPITAL_COMMUNITY): Payer: Medicare Other

## 2019-01-12 VITALS — BP 115/60 | HR 71 | Temp 98.2°F | Resp 18

## 2019-01-12 DIAGNOSIS — K5090 Crohn's disease, unspecified, without complications: Secondary | ICD-10-CM | POA: Diagnosis not present

## 2019-01-12 DIAGNOSIS — E041 Nontoxic single thyroid nodule: Secondary | ICD-10-CM | POA: Diagnosis not present

## 2019-01-12 DIAGNOSIS — E538 Deficiency of other specified B group vitamins: Secondary | ICD-10-CM | POA: Diagnosis not present

## 2019-01-12 DIAGNOSIS — D508 Other iron deficiency anemias: Secondary | ICD-10-CM | POA: Diagnosis not present

## 2019-01-12 DIAGNOSIS — D500 Iron deficiency anemia secondary to blood loss (chronic): Secondary | ICD-10-CM

## 2019-01-12 MED ORDER — SODIUM CHLORIDE 0.9 % IV SOLN
INTRAVENOUS | Status: DC
  Administered 2019-01-12: 14:00:00 via INTRAVENOUS

## 2019-01-12 MED ORDER — SODIUM CHLORIDE 0.9 % IV SOLN
510.0000 mg | Freq: Once | INTRAVENOUS | Status: AC
  Administered 2019-01-12: 510 mg via INTRAVENOUS
  Filled 2019-01-12: qty 510

## 2019-01-12 NOTE — Patient Instructions (Signed)
[facility] at [facility] Discharge Instructions  Received Feraheme infusion today. Follow-up as scheduled. Call clinic for any questions or concerns   Thank you for choosing [facility] at [facility] to provide your oncology and hematology care.  To afford each patient quality time with our provider, please arrive at least 15 minutes before your scheduled appointment time.   If you have a lab appointment with the Cancer Center please come in thru the  Main Entrance and check in at the main information desk  You need to re-schedule your appointment should you arrive 10 or more minutes late.  We strive to give you quality time with our providers, and arriving late affects you and other patients whose appointments are after yours.  Also, if you no show three or more times for appointments you may be dismissed from the clinic at the providers discretion.     Again, thank you for choosing [facility].  Our hope is that these requests will decrease the amount of time that you wait before being seen by our physicians.       _____________________________________________________________  Should you have questions after your visit to [facility], please contact our office at [phone] between the hours of 8:00 a.m. and 4:30 p.m.  Voicemails left after 4:00 p.m. will not be returned until the following business day.  For prescription refill requests, have your pharmacy contact our office and allow 72 hours.    Cancer Center Support Programs:   > Cancer Support Group  2nd Tuesday of the month 1pm-2pm, Journey Room   

## 2019-01-12 NOTE — Progress Notes (Signed)
[name] tolerated Feraheme infusion well without complaints or incident. VSS upon discharge, Pt discharged self ambulatory in satisfactory condition

## 2019-01-19 ENCOUNTER — Ambulatory Visit (INDEPENDENT_AMBULATORY_CARE_PROVIDER_SITE_OTHER): Payer: Medicare Other | Admitting: *Deleted

## 2019-01-19 ENCOUNTER — Ambulatory Visit: Payer: Medicare Other

## 2019-01-19 ENCOUNTER — Other Ambulatory Visit: Payer: Self-pay

## 2019-01-19 ENCOUNTER — Encounter (HOSPITAL_COMMUNITY): Payer: Self-pay

## 2019-01-19 ENCOUNTER — Inpatient Hospital Stay (HOSPITAL_COMMUNITY): Payer: Medicare Other

## 2019-01-19 VITALS — BP 127/54 | HR 72 | Temp 98.0°F | Resp 18

## 2019-01-19 DIAGNOSIS — E538 Deficiency of other specified B group vitamins: Secondary | ICD-10-CM | POA: Diagnosis not present

## 2019-01-19 DIAGNOSIS — D500 Iron deficiency anemia secondary to blood loss (chronic): Secondary | ICD-10-CM

## 2019-01-19 DIAGNOSIS — K5090 Crohn's disease, unspecified, without complications: Secondary | ICD-10-CM | POA: Diagnosis not present

## 2019-01-19 DIAGNOSIS — E041 Nontoxic single thyroid nodule: Secondary | ICD-10-CM | POA: Diagnosis not present

## 2019-01-19 DIAGNOSIS — D508 Other iron deficiency anemias: Secondary | ICD-10-CM | POA: Diagnosis not present

## 2019-01-19 MED ORDER — SODIUM CHLORIDE 0.9 % IV SOLN
510.0000 mg | Freq: Once | INTRAVENOUS | Status: AC
  Administered 2019-01-19: 15:00:00 510 mg via INTRAVENOUS
  Filled 2019-01-19: qty 510

## 2019-01-19 MED ORDER — SODIUM CHLORIDE 0.9 % IV SOLN
INTRAVENOUS | Status: DC
  Administered 2019-01-19: 14:00:00 via INTRAVENOUS

## 2019-01-19 MED ORDER — CYANOCOBALAMIN 1000 MCG/ML IJ SOLN
1000.0000 ug | Freq: Once | INTRAMUSCULAR | Status: AC
  Administered 2019-01-19: 1000 ug via INTRAMUSCULAR

## 2019-01-19 NOTE — Patient Instructions (Signed)
[facility] at [facility]  Discharge Instructions:   _______________________________________________________________  Thank you for choosing [facility] at [facility] to provide your oncology and hematology care.  To afford each patient quality time with our providers, please arrive at least 15 minutes before your scheduled appointment.  You need to re-schedule your appointment if you arrive 10 or more minutes late.  We strive to give you quality time with our providers, and arriving late affects you and other patients whose appointments are after yours.  Also, if you no show three or more times for appointments you may be dismissed from the clinic.  Again, thank you for choosing [facility] at [facility]. Our hope is that these requests will allow you access to exceptional care and in a timely manner. _______________________________________________________________  If you have questions after your visit, please contact our office at [phone] between the hours of 8:30 a.m. and 5:00 p.m. Voicemails left after 4:30 p.m. will not be returned until the following business day. _______________________________________________________________  For prescription refill requests, have your pharmacy contact our office. _______________________________________________________________  Recommendations made by the consultant and any test results will be sent to your referring physician. _______________________________________________________________ 

## 2019-02-17 ENCOUNTER — Other Ambulatory Visit: Payer: Self-pay

## 2019-02-17 ENCOUNTER — Ambulatory Visit (INDEPENDENT_AMBULATORY_CARE_PROVIDER_SITE_OTHER): Payer: Medicare Other

## 2019-02-17 DIAGNOSIS — E538 Deficiency of other specified B group vitamins: Secondary | ICD-10-CM | POA: Diagnosis not present

## 2019-02-17 MED ORDER — CYANOCOBALAMIN 1000 MCG/ML IJ SOLN
1000.0000 ug | Freq: Once | INTRAMUSCULAR | Status: AC
  Administered 2019-02-17: 1000 ug via INTRAMUSCULAR

## 2019-03-17 ENCOUNTER — Inpatient Hospital Stay (HOSPITAL_COMMUNITY): Payer: Medicare Other | Attending: Hematology

## 2019-03-17 ENCOUNTER — Other Ambulatory Visit: Payer: Self-pay

## 2019-03-17 DIAGNOSIS — K5090 Crohn's disease, unspecified, without complications: Secondary | ICD-10-CM | POA: Diagnosis not present

## 2019-03-17 DIAGNOSIS — Z79899 Other long term (current) drug therapy: Secondary | ICD-10-CM | POA: Insufficient documentation

## 2019-03-17 DIAGNOSIS — D500 Iron deficiency anemia secondary to blood loss (chronic): Secondary | ICD-10-CM

## 2019-03-17 DIAGNOSIS — E041 Nontoxic single thyroid nodule: Secondary | ICD-10-CM | POA: Diagnosis not present

## 2019-03-17 DIAGNOSIS — E538 Deficiency of other specified B group vitamins: Secondary | ICD-10-CM | POA: Insufficient documentation

## 2019-03-17 DIAGNOSIS — D508 Other iron deficiency anemias: Secondary | ICD-10-CM | POA: Insufficient documentation

## 2019-03-17 LAB — CBC WITH DIFFERENTIAL/PLATELET
Abs Immature Granulocytes: 0.02 10*3/uL (ref 0.00–0.07)
Basophils Absolute: 0 10*3/uL (ref 0.0–0.1)
Basophils Relative: 1 %
Eosinophils Absolute: 0.1 10*3/uL (ref 0.0–0.5)
Eosinophils Relative: 1 %
HCT: 39.5 % (ref 36.0–46.0)
Hemoglobin: 12.6 g/dL (ref 12.0–15.0)
Immature Granulocytes: 0 %
Lymphocytes Relative: 25 %
Lymphs Abs: 1.8 10*3/uL (ref 0.7–4.0)
MCH: 28 pg (ref 26.0–34.0)
MCHC: 31.9 g/dL (ref 30.0–36.0)
MCV: 87.8 fL (ref 80.0–100.0)
Monocytes Absolute: 0.5 10*3/uL (ref 0.1–1.0)
Monocytes Relative: 8 %
Neutro Abs: 4.6 10*3/uL (ref 1.7–7.7)
Neutrophils Relative %: 65 %
Platelets: 232 10*3/uL (ref 150–400)
RBC: 4.5 MIL/uL (ref 3.87–5.11)
RDW: 13.7 % (ref 11.5–15.5)
WBC: 7 10*3/uL (ref 4.0–10.5)
nRBC: 0 % (ref 0.0–0.2)

## 2019-03-17 LAB — COMPREHENSIVE METABOLIC PANEL
ALT: 18 U/L (ref 0–44)
AST: 17 U/L (ref 15–41)
Albumin: 4 g/dL (ref 3.5–5.0)
Alkaline Phosphatase: 96 U/L (ref 38–126)
Anion gap: 10 (ref 5–15)
BUN: 17 mg/dL (ref 8–23)
CO2: 22 mmol/L (ref 22–32)
Calcium: 8.9 mg/dL (ref 8.9–10.3)
Chloride: 107 mmol/L (ref 98–111)
Creatinine, Ser: 0.74 mg/dL (ref 0.44–1.00)
GFR calc Af Amer: >60 mL/min (ref >60)
GFR calc non Af Amer: >60 mL/min (ref >60)
Glucose, Bld: 105 mg/dL — ABNORMAL HIGH (ref 70–99)
Potassium: 4.1 mmol/L (ref 3.5–5.1)
Sodium: 139 mmol/L (ref 135–145)
Total Bilirubin: 0.4 mg/dL (ref 0.3–1.2)
Total Protein: 7.4 g/dL (ref 6.5–8.1)

## 2019-03-17 LAB — IRON AND TIBC
Iron: 33 ug/dL (ref 28–170)
Saturation Ratios: 10 % — ABNORMAL LOW (ref 10.4–31.8)
TIBC: 324 ug/dL (ref 250–450)
UIBC: 291 ug/dL

## 2019-03-17 LAB — FOLATE: Folate: 19.2 ng/mL (ref >5.9)

## 2019-03-17 LAB — LACTATE DEHYDROGENASE: LDH: 113 U/L (ref 98–192)

## 2019-03-17 LAB — FERRITIN: Ferritin: 27 ng/mL (ref 11–307)

## 2019-03-17 LAB — VITAMIN B12: Vitamin B-12: 216 pg/mL (ref 180–914)

## 2019-03-18 LAB — VITAMIN D 25 HYDROXY (VIT D DEFICIENCY, FRACTURES): Vit D, 25-Hydroxy: 10.2 ng/mL — ABNORMAL LOW (ref 30.0–100.0)

## 2019-03-23 ENCOUNTER — Other Ambulatory Visit: Payer: Self-pay

## 2019-03-23 ENCOUNTER — Other Ambulatory Visit (INDEPENDENT_AMBULATORY_CARE_PROVIDER_SITE_OTHER): Payer: Medicare Other | Admitting: *Deleted

## 2019-03-23 DIAGNOSIS — E538 Deficiency of other specified B group vitamins: Secondary | ICD-10-CM

## 2019-03-23 MED ORDER — CYANOCOBALAMIN 1000 MCG/ML IJ SOLN
1000.0000 ug | Freq: Once | INTRAMUSCULAR | Status: AC
  Administered 2019-03-23: 1000 ug via INTRAMUSCULAR

## 2019-03-24 ENCOUNTER — Inpatient Hospital Stay (HOSPITAL_BASED_OUTPATIENT_CLINIC_OR_DEPARTMENT_OTHER): Payer: Medicare Other | Admitting: Nurse Practitioner

## 2019-03-24 ENCOUNTER — Encounter (HOSPITAL_COMMUNITY): Payer: Self-pay | Admitting: Nurse Practitioner

## 2019-03-24 VITALS — BP 121/52 | HR 86 | Temp 96.4°F | Resp 16 | Wt 216.0 lb

## 2019-03-24 DIAGNOSIS — D500 Iron deficiency anemia secondary to blood loss (chronic): Secondary | ICD-10-CM | POA: Diagnosis not present

## 2019-03-24 DIAGNOSIS — E559 Vitamin D deficiency, unspecified: Secondary | ICD-10-CM

## 2019-03-24 DIAGNOSIS — K5090 Crohn's disease, unspecified, without complications: Secondary | ICD-10-CM | POA: Diagnosis not present

## 2019-03-24 DIAGNOSIS — E041 Nontoxic single thyroid nodule: Secondary | ICD-10-CM | POA: Diagnosis not present

## 2019-03-24 DIAGNOSIS — E538 Deficiency of other specified B group vitamins: Secondary | ICD-10-CM | POA: Diagnosis not present

## 2019-03-24 DIAGNOSIS — D508 Other iron deficiency anemias: Secondary | ICD-10-CM | POA: Diagnosis not present

## 2019-03-24 DIAGNOSIS — Z79899 Other long term (current) drug therapy: Secondary | ICD-10-CM | POA: Diagnosis not present

## 2019-03-24 MED ORDER — ERGOCALCIFEROL 1.25 MG (50000 UT) PO CAPS: 50000.0000 [IU] | ORAL_CAPSULE | ORAL | 1 refills | Status: DC

## 2019-03-24 NOTE — Patient Instructions (Addendum)
[place] at [facility]  Discharge Instructions:follow up in 3 months with labs   You saw Francene Finders, NP, today. _______________________________________________________________  Thank you for choosing [place] at [facility] to provide your oncology and hematology care.  To afford each patient quality time with our providers, please arrive at least 15 minutes before your scheduled appointment.  You need to re-schedule your appointment if you arrive 10 or more minutes late.  We strive to give you quality time with our providers, and arriving late affects you and other patients whose appointments are after yours.  Also, if you no show three or more times for appointments you may be dismissed from the clinic.  Again, thank you for choosing [facility] [place] hope is that these requests will allow you access to exceptional care and in a timely manner. _______________________________________________________________  If you have questions after your visit, please contact our office at [phone] between the hours of 8:30 a.m. and 5:00 p.m. Voicemails left after 4:30 p.m. will not be returned until the following business day. _______________________________________________________________  For prescription refill requests, have your pharmacy contact our office. _______________________________________________________________  Recommendations made by the consultant and any test results will be sent to your referring physician. _______________________________________________________________

## 2019-03-24 NOTE — Progress Notes (Signed)
[place], [place] [address]   CLINIC:  Medical Oncology/Hematology  PCP:  Kathyrn Drown, MD [address] [place] [address] [phone]   REASON FOR VISIT: Follow-up for iron deficiency anemia  CURRENT THERAPY: Intermittent iron infusions   INTERVAL HISTORY:  [name] 68 y.o. female returns for routine follow-up for iron deficiency anemia.  She reports she is still fatigued during the day.  She occasionally gets dizzy and has headaches.  She denies any bright red bleeding per rectum or melena. Denies any nausea, vomiting, or diarrhea. Denies any new pains. Had not noticed any recent bleeding such as epistaxis, hematuria or hematochezia. Denies recent chest pain on exertion, shortness of breath on minimal exertion, pre-syncopal episodes, or palpitations. Denies any numbness or tingling in hands or feet. Denies any recent fevers, infections, or recent hospitalizations. Patient reports appetite at 100% and energy level at 50%.  She is eating well maintaining her weight at this time.    REVIEW OF SYSTEMS:  Review of Systems  Constitutional: Positive for fatigue.  All other systems reviewed and are negative.    PAST MEDICAL/SURGICAL HISTORY:  Past Medical History:  Diagnosis Date  . Arthritis   . B12 deficiency 07/09/2015  . Crohn disease ([place]) JULY 2011 FEDA   PERSISTENT TI ULCERS seen on CE, NO NSAID USE  . Endometriosis    HISTORY  LEADING TO HYSTERECTOMY  . Fibromyalgia   . Fibromyalgia 12/09/2013  . Gastric ulcer   . GERD (gastroesophageal reflux disease)   . Headache   . Helicobacter pylori gastritis    HISTORY  . History of hiatal hernia   . Hives   . Iron (Fe) deficiency anemia 2009   JAN 2012 NL HB & FERRITIN  . Obesity   . Osteoporosis 11/20/2014  . Plantar fasciitis    Past Surgical History:  Procedure Laterality Date  . ABDOMINAL HYSTERECTOMY    . CATARACT EXTRACTION W/PHACO Right 12/05/2015   Procedure: CATARACT EXTRACTION PHACO AND INTRAOCULAR LENS PLACEMENT (IOC);  Surgeon: Birder Robson, MD;  Location: [facility] ORS;  Service: Ophthalmology;  Laterality: Right;  [place] 00:59.9 AP% 21.9 CDE 13.10 fluid pack lot # [id number]  . CATARACT EXTRACTION W/PHACO Left 06/24/2016   Procedure: CATARACT EXTRACTION PHACO AND INTRAOCULAR LENS PLACEMENT LEFT EYE; CDE:  8.33;  Surgeon: Tonny Branch, MD;  Location: [facility];  Service: Ophthalmology;  Laterality: Left;  . COLONOSCOPY    . ESOPHAGOGASTRODUODENOSCOPY  10/14/2007   Esophagus showed 2 cm pink tongue seen extending from the GE junction.  Biopsies obtained via cold forceps.  Otherwise the  esophagus was without erosions, mass, ulceration, or stricture / . Large hiatal hernia pouch with linear erosions, and two 3-6 mmulcers seen in the pouch.  Biopsies obtained via cold forceps to  evaluate for H. pylori gastritis or malignancy  . Ileocolonoscopy  10/14/2007   Multiple 1-3 mm terminal ileum ulcers biopsied  The ulcers seemed to only involve the last 5 cm of her terminal ileum, and 10 cm of her ileum was visualized.  Otherwise the colon  was without polyps, masses, diverticula, or AVMs.  She had a normal retroflexed view of the rectum  . OVARIAN CYST REMOVAL    . TOTAL ABDOMINAL HYSTERECTOMY W/ BILATERAL SALPINGOOPHORECTOMY    . UPPER GASTROINTESTINAL ENDOSCOPY  JULY 2011 DIARRHEA/FEDA    Bx-H.PYLORI GASTRITIS, NL DUODENUM     SOCIAL HISTORY:  Social History   Socioeconomic History  . Marital status:  Married    Spouse name: Alvester Chou   . Number of children: 3  . Years of education: 29  . Highest education level: Not on file  Occupational History  . Occupation: Retired  Scientific laboratory technician  . Financial resource strain: Not on file  . Food insecurity    Worry: Not on file    Inability: Not on file  . Transportation needs    Medical: Not on file    Non-medical: Not on file  Tobacco Use  . Smoking status: Never Smoker  . Smokeless tobacco: Never  Used  Substance and Sexual Activity  . Alcohol use: No  . Drug use: No  . Sexual activity: Yes    Birth control/protection: Surgical  Lifestyle  . Physical activity    Days per week: Not on file    Minutes per session: Not on file  . Stress: Not on file  Relationships  . Social Herbalist on phone: Not on file    Gets together: Not on file    Attends religious service: Not on file    Active member of club or organization: Not on file    Attends meetings of clubs or organizations: Not on file    Relationship status: Not on file  . Intimate partner violence    Fear of current or ex partner: Not on file    Emotionally abused: Not on file    Physically abused: Not on file    Forced sexual activity: Not on file  Other Topics Concern  . Not on file  Social History Narrative   Lives w/ husband   Caffeine use: Drinks 1-2 cups coffee per day    FAMILY HISTORY:  Family History  Problem Relation Age of Onset  . Diabetes Mother   . Cancer Father   . Diabetes Father   . Diabetes Sister   . Diabetes Brother   . Colon polyps Neg Hx   . Colon cancer Neg Hx   . Stroke Neg Hx     CURRENT MEDICATIONS:  Outpatient Encounter Medications as of 03/24/2019  Medication Sig  . acetaminophen (TYLENOL) 325 MG tablet Take 650 mg by mouth as needed for moderate pain or headache.   . ergocalciferol (VITAMIN D2) 1.25 MG (50000 UT) capsule Take 1 capsule (50,000 Units total) by mouth once a week.   Facility-Administered Encounter Medications as of 03/24/2019  Medication  . 0.9 %  sodium chloride infusion    ALLERGIES:  Allergies  Allergen Reactions  . Cefzil [Cefprozil] Rash  . Celebrex [Celecoxib] Hives and Itching  . Sulfonamide Derivatives Itching     PHYSICAL EXAM:  ECOG Performance status: 1  Vitals:   03/24/19 1141  BP: (!) 121/52  Pulse: 86  Resp: 16  Temp: (!) 96.4 F (35.8 C)  SpO2: 97%   Filed Weights   03/24/19 1141  Weight: 216 lb (98 kg)     Physical Exam Constitutional:      Appearance: Normal appearance. She is normal weight.  Cardiovascular:     Rate and Rhythm: Normal rate and regular rhythm.     Heart sounds: Normal heart sounds.  Pulmonary:     Effort: Pulmonary effort is normal.     Breath sounds: Normal breath sounds.  Abdominal:     General: Bowel sounds are normal.     Palpations: Abdomen is soft.  Musculoskeletal: Normal range of motion.  Skin:    General: Skin is warm and dry.  Neurological:  Mental Status: She is alert and oriented to person, place, and time. Mental status is at baseline.  Psychiatric:        Mood and Affect: Mood normal.        Behavior: Behavior normal.        Thought Content: Thought content normal.        Judgment: Judgment normal.      LABORATORY DATA:  I have reviewed the labs as listed.  CBC    Component Value Date/Time   WBC 7.0 03/17/2019 1239   RBC 4.50 03/17/2019 1239   HGB 12.6 03/17/2019 1239   HCT 39.5 03/17/2019 1239   PLT 232 03/17/2019 1239   MCV 87.8 03/17/2019 1239   MCH 28.0 03/17/2019 1239   MCHC 31.9 03/17/2019 1239   RDW 13.7 03/17/2019 1239   LYMPHSABS 1.8 03/17/2019 1239   MONOABS 0.5 03/17/2019 1239   EOSABS 0.1 03/17/2019 1239   BASOSABS 0.0 03/17/2019 1239   CMP Latest Ref Rng & Units 03/17/2019 12/30/2018 10/20/2018  Glucose 70 - 99 mg/dL 105(H) 186(H) 105(H)  BUN 8 - 23 mg/dL 17 23 19   Creatinine 0.44 - 1.00 mg/dL 0.74 0.63 0.62  Sodium 135 - 145 mmol/L 139 139 139  Potassium 3.5 - 5.1 mmol/L 4.1 4.1 4.0  Chloride 98 - 111 mmol/L 107 108 109  CO2 22 - 32 mmol/L 22 23 22   Calcium 8.9 - 10.3 mg/dL 8.9 8.4(L) 8.6(L)  Total Protein 6.5 - 8.1 g/dL 7.4 7.1 7.1  Total Bilirubin 0.3 - 1.2 mg/dL 0.4 0.4 <0.1(L)  Alkaline Phos 38 - 126 U/L 96 87 103  AST 15 - 41 U/L 17 15 11(L)  ALT 0 - 44 U/L 18 14 12      I personally performed a face-to-face visit.  All questions were answered to patient's stated satisfaction. Encouraged patient to call  with any new concerns or questions before his next visit to the cancer center and we can certain see him sooner, if needed.     ASSESSMENT & PLAN:   Iron deficiency anemia 1.  Iron deficiency anemia: - She has felt to have anemia due to chronic blood loss and malabsorption.   - She requires intermittent IV iron infusions.  Her last infusions were 01/12/2019 and 01/19/2019 - Labs done on 03/17/2019 showed her hemoglobin 12.6, ferritin 27, percent saturation 10, platelets 232, WBC 7.9, creatinine 0.74 - We will set her up with 2 more infusions of Feraheme. - She will follow-up in 3 months with repeat labs.  2.  B12 deficiency: - She is on B12 injections monthly. - Labs done on 03/17/2019 showed vitamin B12 level at 216.   -We recommend she continue on the B12 injection replacement at this time. -She last received her B12 injection at her PCP on 03/23/2019 -We will also place her on oral B12 sublingual thousand milligrams daily - We will repeat labs when she follows up in 3 months.  3.  Vitamin D deficiency: -Labs done on 03/17/2019 showed her vitamin D level at 10.2 -I prescribed weekly 50,000 units vitamin D. -We will recheck her levels in 3 months.  4.  Crohn's disease: - She is closely followed by GI due to her recurrent IDA. - She missed her recent follow-up with her GI doctor. -She is due for another colonoscopy she wishes to wait a couple months and she will ask for a referral.  5.  Thyroid nodule: - On 08/2017 a thyroid nodule was found by ultrasound. - A  biopsy was done 09/2017 that showed benign nodule. - Patient should follow-up with PCP or endocrinology as directed.  6.  Health maintenance: - Last colonoscopy was done 02/22/2010.  She is recommended to follow-up with GI. - Her last mammogram was in 2006.  It is recommended to schedule a mammogram.      Orders placed this encounter:  Orders Placed This Encounter  Procedures  . Lactate dehydrogenase  . CBC with  Differential/Platelet  . Comprehensive metabolic panel  . Ferritin  . Iron and TIBC  . Vitamin B12  . VITAMIN D 25 Hydroxy (Vit-D Deficiency, Fractures)      Francene Finders, FNP-[facility] [place] [phone]

## 2019-03-24 NOTE — Assessment & Plan Note (Addendum)
1.  Iron deficiency anemia: - She has felt to have anemia due to chronic blood loss and malabsorption.   - She requires intermittent IV iron infusions.  Her last infusions were 01/12/2019 and 01/19/2019 - Labs done on 03/17/2019 showed her hemoglobin 12.6, ferritin 27, percent saturation 10, platelets 232, WBC 7.9, creatinine 0.74 - We will set her up with 2 more infusions of Feraheme. - She will follow-up in 3 months with repeat labs.  2.  B12 deficiency: - She is on B12 injections monthly. - Labs done on 03/17/2019 showed vitamin B12 level at 216.   -We recommend she continue on the B12 injection replacement at this time. -She last received her B12 injection at her PCP on 03/23/2019 -We will also place her on oral B12 sublingual thousand milligrams daily - We will repeat labs when she follows up in 3 months.  3.  Vitamin D deficiency: -Labs done on 03/17/2019 showed her vitamin D level at 10.2 -I prescribed weekly 50,000 units vitamin D. -We will recheck her levels in 3 months.  4.  Crohn's disease: - She is closely followed by GI due to her recurrent IDA. - She missed her recent follow-up with her GI doctor. -She is due for another colonoscopy she wishes to wait a couple months and she will ask for a referral.  5.  Thyroid nodule: - On 08/2017 a thyroid nodule was found by ultrasound. - A biopsy was done 09/2017 that showed benign nodule. - Patient should follow-up with PCP or endocrinology as directed.  6.  Health maintenance: - Last colonoscopy was done 02/22/2010.  She is recommended to follow-up with GI. - Her last mammogram was in 2006.  It is recommended to schedule a mammogram.

## 2019-04-02 ENCOUNTER — Encounter (HOSPITAL_COMMUNITY): Payer: Self-pay

## 2019-04-02 ENCOUNTER — Inpatient Hospital Stay (HOSPITAL_COMMUNITY): Payer: Medicare Other | Attending: Hematology

## 2019-04-02 ENCOUNTER — Other Ambulatory Visit: Payer: Self-pay

## 2019-04-02 VITALS — BP 117/56 | HR 70 | Temp 97.9°F | Resp 16

## 2019-04-02 DIAGNOSIS — D500 Iron deficiency anemia secondary to blood loss (chronic): Secondary | ICD-10-CM

## 2019-04-02 DIAGNOSIS — Z79899 Other long term (current) drug therapy: Secondary | ICD-10-CM | POA: Diagnosis not present

## 2019-04-02 DIAGNOSIS — D508 Other iron deficiency anemias: Secondary | ICD-10-CM | POA: Diagnosis not present

## 2019-04-02 MED ORDER — SODIUM CHLORIDE 0.9 % IV SOLN
510.0000 mg | Freq: Once | INTRAVENOUS | Status: AC
  Administered 2019-04-02: 09:00:00 510 mg via INTRAVENOUS
  Filled 2019-04-02: qty 510

## 2019-04-02 MED ORDER — SODIUM CHLORIDE 0.9 % IV SOLN
INTRAVENOUS | Status: DC
  Administered 2019-04-02: 08:00:00 via INTRAVENOUS

## 2019-04-02 NOTE — Patient Instructions (Signed)
[facility] at [facility]  Discharge Instructions:   _______________________________________________________________  Thank you for choosing [facility] at [facility] to provide your oncology and hematology care.  To afford each patient quality time with our providers, please arrive at least 15 minutes before your scheduled appointment.  You need to re-schedule your appointment if you arrive 10 or more minutes late.  We strive to give you quality time with our providers, and arriving late affects you and other patients whose appointments are after yours.  Also, if you no show three or more times for appointments you may be dismissed from the clinic.  Again, thank you for choosing  [facility] at [facility]. Our hope is that these requests will allow you access to exceptional care and in a timely manner. _______________________________________________________________  If you have questions after your visit, please contact our office at [phone] between the hours of 8:30 a.m. and 5:00 p.m. Voicemails left after 4:30 p.m. will not be returned until the following business day. _______________________________________________________________  For prescription refill requests, have your pharmacy contact our office. _______________________________________________________________  Recommendations made by the consultant and any test results will be sent to your referring physician. _______________________________________________________________ 

## 2019-04-02 NOTE — Progress Notes (Signed)
Feraheme given per orders. Patient tolerated it well without problems. Vitals stable and discharged home from clinic ambulatory. Follow up as scheduled.  

## 2019-04-09 ENCOUNTER — Encounter (HOSPITAL_COMMUNITY): Payer: Self-pay

## 2019-04-09 ENCOUNTER — Inpatient Hospital Stay (HOSPITAL_COMMUNITY): Payer: Medicare Other

## 2019-04-09 ENCOUNTER — Other Ambulatory Visit: Payer: Self-pay

## 2019-04-09 VITALS — BP 126/51 | HR 80 | Temp 97.3°F | Resp 18

## 2019-04-09 DIAGNOSIS — D500 Iron deficiency anemia secondary to blood loss (chronic): Secondary | ICD-10-CM

## 2019-04-09 DIAGNOSIS — D508 Other iron deficiency anemias: Secondary | ICD-10-CM | POA: Diagnosis not present

## 2019-04-09 DIAGNOSIS — Z79899 Other long term (current) drug therapy: Secondary | ICD-10-CM | POA: Diagnosis not present

## 2019-04-09 MED ORDER — SODIUM CHLORIDE 0.9 % IV SOLN
510.0000 mg | Freq: Once | INTRAVENOUS | Status: AC
  Administered 2019-04-09: 510 mg via INTRAVENOUS
  Filled 2019-04-09: qty 510

## 2019-04-09 MED ORDER — SODIUM CHLORIDE 0.9 % IV SOLN
INTRAVENOUS | Status: DC
  Administered 2019-04-09: 08:00:00 via INTRAVENOUS

## 2019-04-09 NOTE — Patient Instructions (Signed)
[facility] at [facility] Discharge Instructions  Received Feraheme infusion today. Follow-up as scheduled. Call clinic for any questions or concerns   Thank you for choosing [facility] at [facility] to provide your oncology and hematology care.  To afford each patient quality time with our provider, please arrive at least 15 minutes before your scheduled appointment time.   If you have a lab appointment with the Cancer Center please come in thru the Main Entrance and check in at the main information desk.  You need to re-schedule your appointment should you arrive 10 or more minutes late.  We strive to give you quality time with our providers, and arriving late affects you and other patients whose appointments are after yours.  Also, if you no show three or more times for appointments you may be dismissed from the clinic at the providers discretion.     Again, thank you for choosing [facility].  Our hope is that these requests will decrease the amount of time that you wait before being seen by our physicians.       _____________________________________________________________  Should you have questions after your visit to [facility], please contact our office at [phone] between the hours of 8:00 a.m. and 4:30 p.m.  Voicemails left after 4:00 p.m. will not be returned until the following business day.  For prescription refill requests, have your pharmacy contact our office and allow 72 hours.    Due to Covid, you will need to wear a mask upon entering the hospital. If you do not have a mask, a mask will be given to you at the Main Entrance upon arrival. For doctor visits, patients may have 1 support person with them. For treatment visits, patients can not have anyone with them due to social distancing guidelines and our immunocompromised population.     

## 2019-04-09 NOTE — Progress Notes (Signed)
[name] tolerated Feraheme infusion well without complaints or incident. VSS upon discharge. Peripheral IV site checked with positive blood return noted prior to and after infusion. Pt discharged self ambulatory in satisfactory condition

## 2019-04-16 IMAGING — US US EXTREM LOW VENOUS*L*
1 series · 14 of 24 positions shown · non-contrast
Comparison: 08/27/2017

CLINICAL DATA: Left leg pain for 5 days

EXAM:
LEFT LOWER EXTREMITY VENOUS DUPLEX ULTRASOUND
TECHNIQUE: Doppler venous assessment of the left lower extremity deep venous
system was performed, including characterization of spectral flow,
compressibility, and phasicity.

[Series 1: us extrem low venous*left* · 0.08mm/px · 14 of 35 slices shown]
[im 1/35]
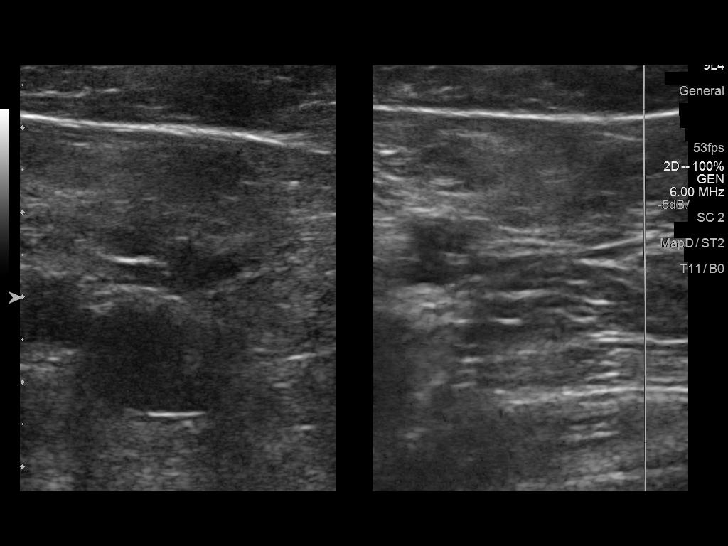
[im 3/35]
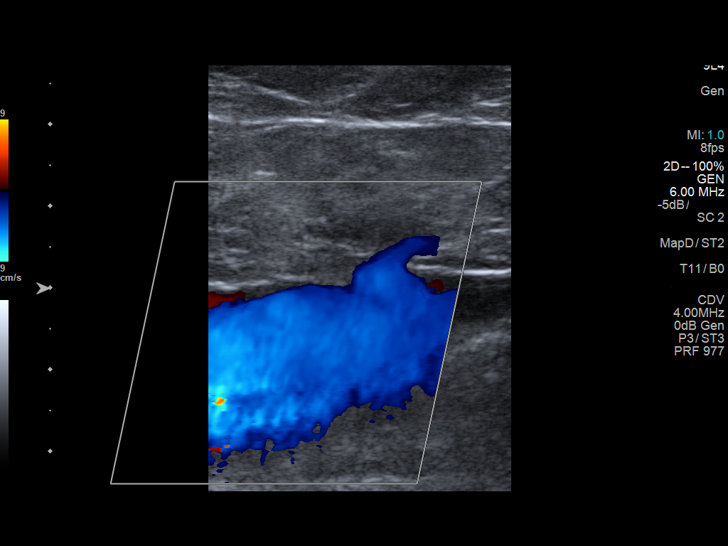
[im 6/35]
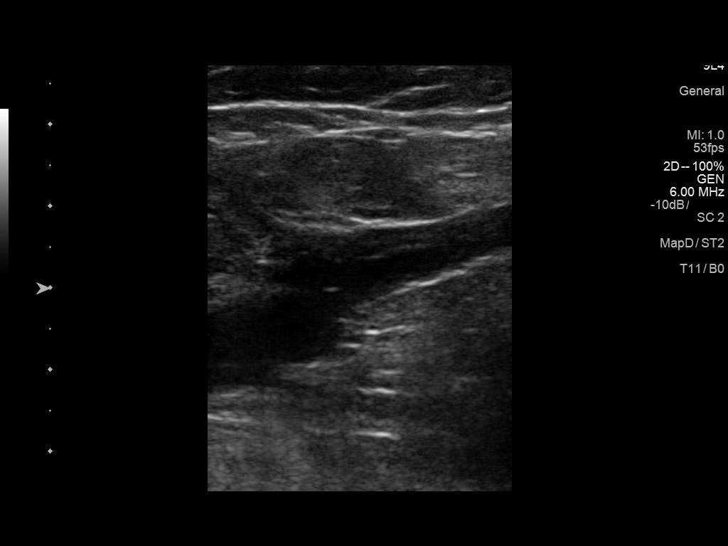
[im 9/35]
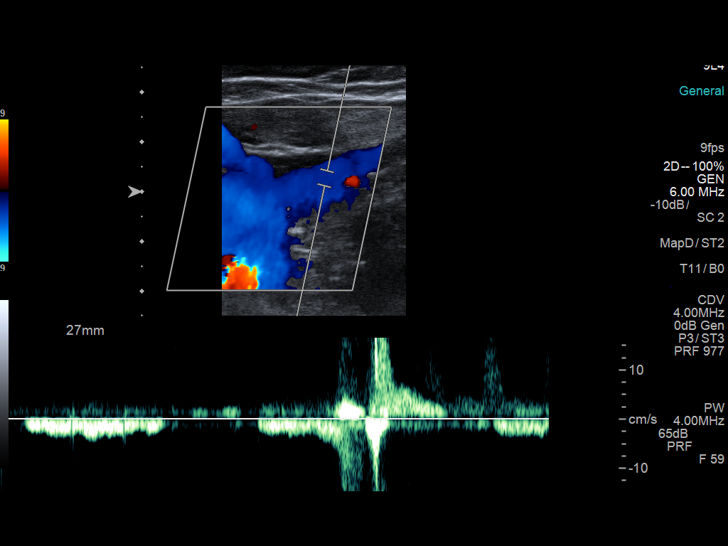
[im 11/35]
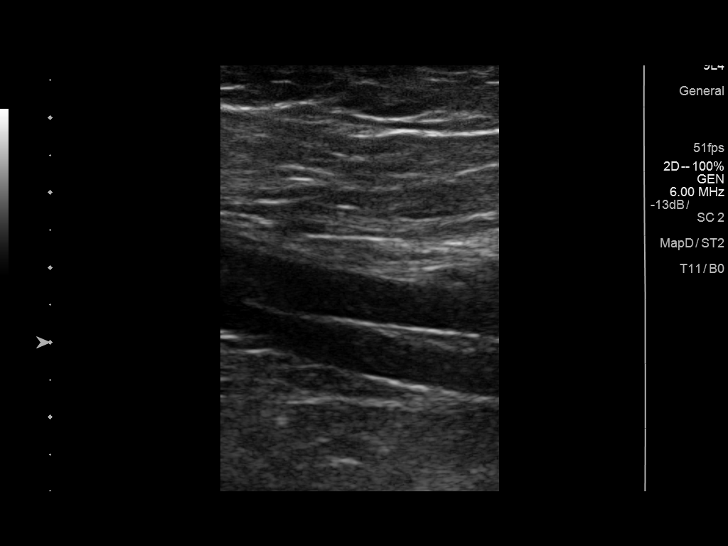
[im 14/35]
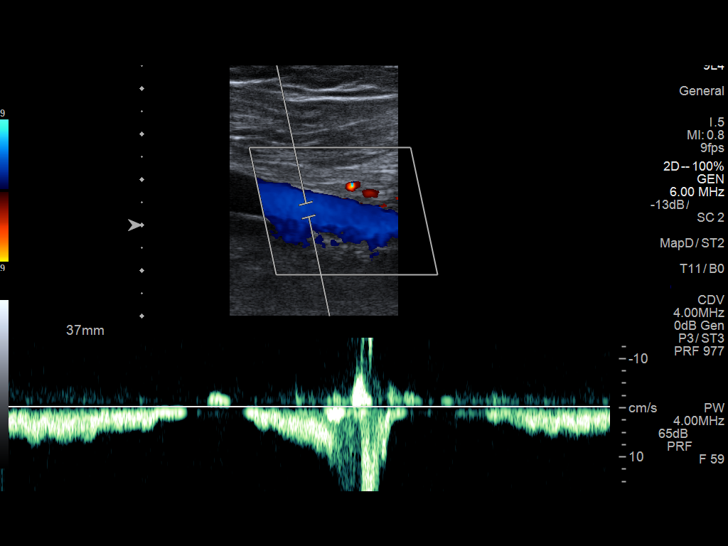
[im 17/35]
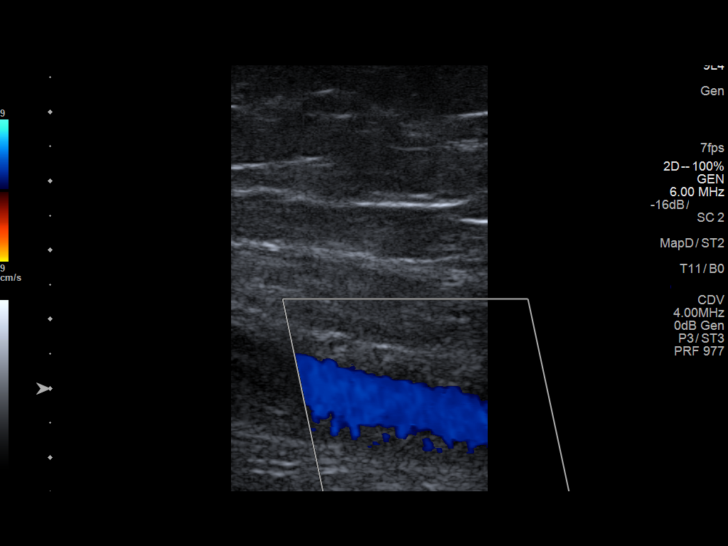
[im 18/35]
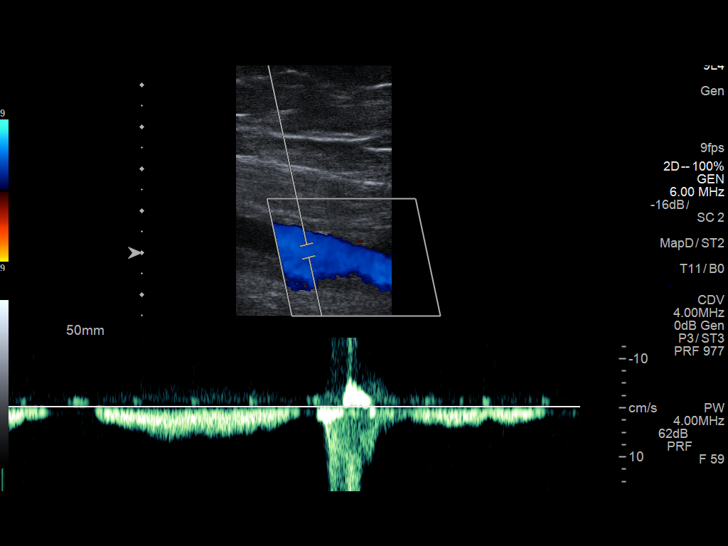
[im 21/35]
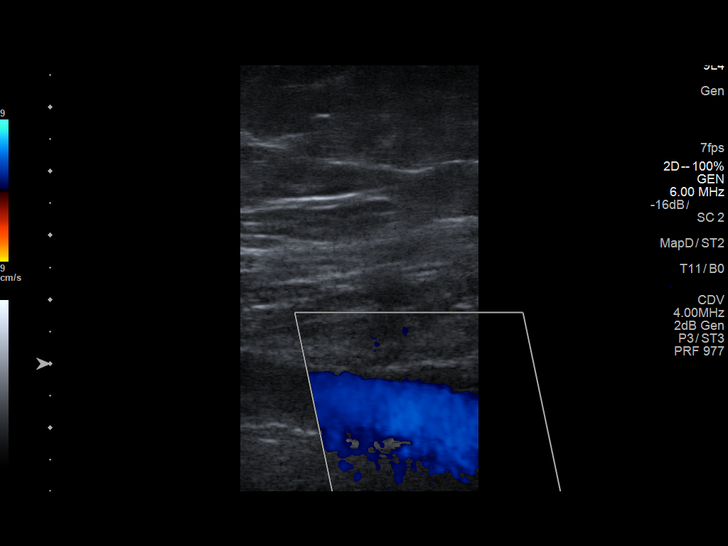
[im 24/35]
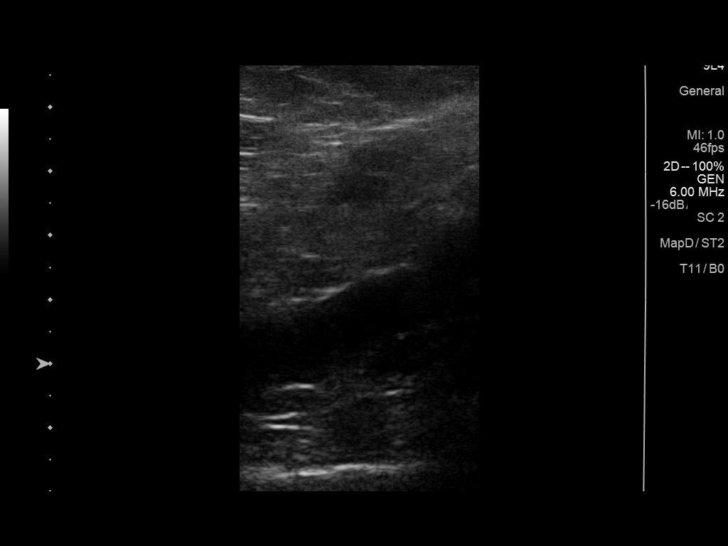
[im 27/35]
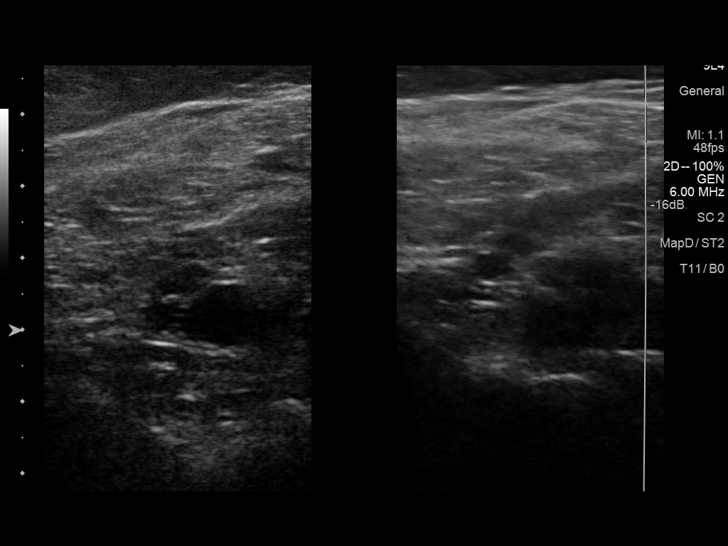
[im 29/35]
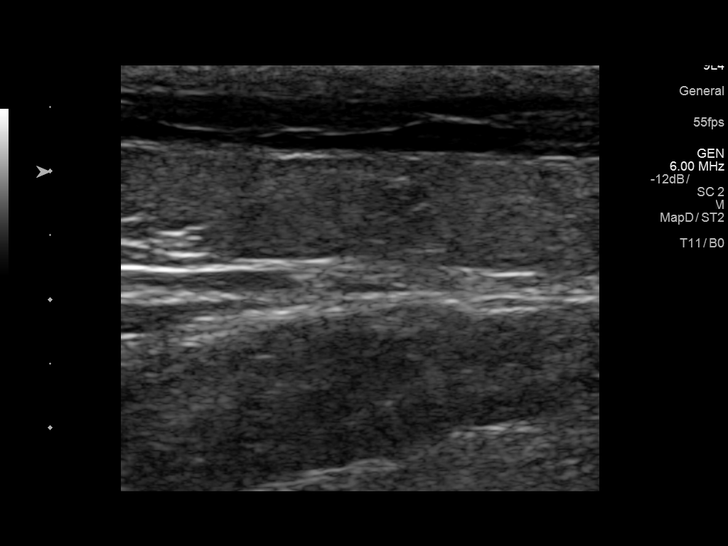
[im 32/35]
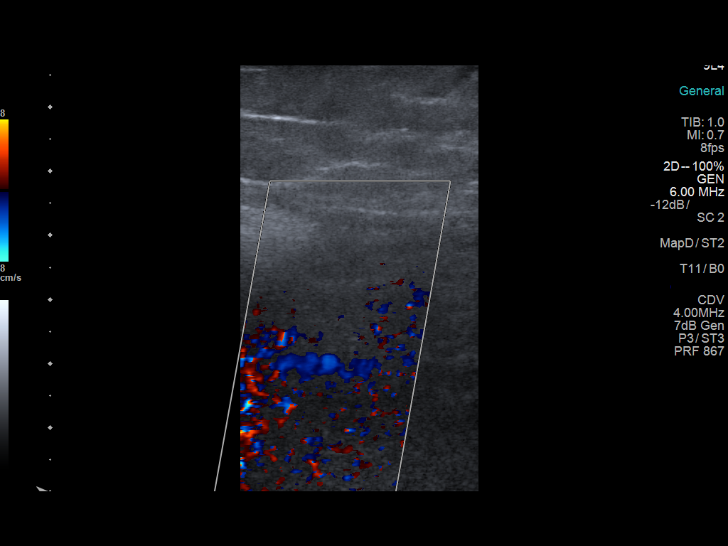
[im 35/35]
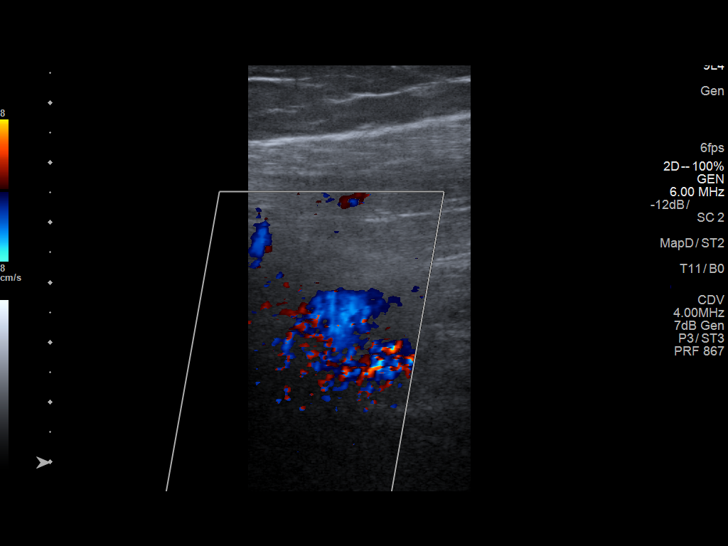

[14 of 24 positions shown; findings below may reference images not displayed]

FINDINGS: There is complete compressibility of the common femoral, femoral,
and popliteal veins. Doppler analysis demonstrates respiratory
phasicity and augmentation of flow with calf compression. No
evidence of calf vein DVT.

There is a small amount of nonocclusive thrombus in the distal
greater saphenous vein within the calf. Overall, thrombus burden in
the greater saphenous vein has improved.
IMPRESSION: No evidence of DVT in the left lower extremity.

There is a small amount of partially occlusive thrombus in the
distal greater saphenous vein. This is a superficial vein. Overall,
thrombus burden in the greater saphenous vein has improved.

## 2019-04-21 ENCOUNTER — Other Ambulatory Visit: Payer: Medicare Other

## 2019-04-23 ENCOUNTER — Telehealth: Payer: Self-pay | Admitting: Family Medicine

## 2019-04-23 NOTE — Telephone Encounter (Signed)
Pt is having procedure Monday, surgical center is unable to send clearance form due to our fax is down  They need to know what medication the patient was on for osteoporosis & for how long  Please call Lorriane Shire [phone] (if after 2:00pm - please follow prompts to get on call nurse)

## 2019-04-23 NOTE — Telephone Encounter (Signed)
Left message to return call 

## 2019-04-23 NOTE — Telephone Encounter (Signed)
So I can see from the history that Fosamax was discussed by Hoyle Sauer with the patient but I do not see evidence where it is in epic medication history  Please verify with patient is she taking Fosamax?  Or potentially have to call her pharmacy to find out if she is getting this filled  Then based on this we can let the specialist know

## 2019-04-23 NOTE — Telephone Encounter (Signed)
Pt contacted and states she is not currently taking Fosamax. Called the number on message and left a voice message on front desk voicemail due to no option for an on all nurse.

## 2019-04-30 ENCOUNTER — Other Ambulatory Visit: Payer: Self-pay

## 2019-04-30 ENCOUNTER — Other Ambulatory Visit (INDEPENDENT_AMBULATORY_CARE_PROVIDER_SITE_OTHER): Payer: Medicare Other | Admitting: *Deleted

## 2019-04-30 DIAGNOSIS — E538 Deficiency of other specified B group vitamins: Secondary | ICD-10-CM | POA: Diagnosis not present

## 2019-04-30 MED ORDER — CYANOCOBALAMIN 1000 MCG/ML IJ SOLN
1000.0000 ug | Freq: Once | INTRAMUSCULAR | Status: AC
  Administered 2019-04-30: 1000 ug via INTRAMUSCULAR

## 2019-06-16 ENCOUNTER — Other Ambulatory Visit (HOSPITAL_COMMUNITY): Payer: Medicare Other

## 2019-06-16 ENCOUNTER — Inpatient Hospital Stay (HOSPITAL_COMMUNITY): Payer: Medicare Other | Attending: Hematology

## 2019-06-16 ENCOUNTER — Other Ambulatory Visit: Payer: Self-pay

## 2019-06-16 DIAGNOSIS — D508 Other iron deficiency anemias: Secondary | ICD-10-CM | POA: Insufficient documentation

## 2019-06-16 DIAGNOSIS — Z79899 Other long term (current) drug therapy: Secondary | ICD-10-CM | POA: Diagnosis not present

## 2019-06-16 DIAGNOSIS — D500 Iron deficiency anemia secondary to blood loss (chronic): Secondary | ICD-10-CM

## 2019-06-16 LAB — COMPREHENSIVE METABOLIC PANEL
ALT: 17 U/L (ref 0–44)
AST: 16 U/L (ref 15–41)
Albumin: 3.9 g/dL (ref 3.5–5.0)
Alkaline Phosphatase: 98 U/L (ref 38–126)
Anion gap: 11 (ref 5–15)
BUN: 22 mg/dL (ref 8–23)
CO2: 23 mmol/L (ref 22–32)
Calcium: 8.9 mg/dL (ref 8.9–10.3)
Chloride: 107 mmol/L (ref 98–111)
Creatinine, Ser: 0.64 mg/dL (ref 0.44–1.00)
GFR calc Af Amer: >60 mL/min (ref >60)
GFR calc non Af Amer: >60 mL/min (ref >60)
Glucose, Bld: 97 mg/dL (ref 70–99)
Potassium: 3.9 mmol/L (ref 3.5–5.1)
Sodium: 141 mmol/L (ref 135–145)
Total Bilirubin: 0.5 mg/dL (ref 0.3–1.2)
Total Protein: 7.4 g/dL (ref 6.5–8.1)

## 2019-06-16 LAB — CBC WITH DIFFERENTIAL/PLATELET
Abs Immature Granulocytes: 0.02 10*3/uL (ref 0.00–0.07)
Basophils Absolute: 0 10*3/uL (ref 0.0–0.1)
Basophils Relative: 1 %
Eosinophils Absolute: 0.1 10*3/uL (ref 0.0–0.5)
Eosinophils Relative: 1 %
HCT: 37.6 % (ref 36.0–46.0)
Hemoglobin: 12.1 g/dL (ref 12.0–15.0)
Immature Granulocytes: 0 %
Lymphocytes Relative: 26 %
Lymphs Abs: 1.6 10*3/uL (ref 0.7–4.0)
MCH: 29.2 pg (ref 26.0–34.0)
MCHC: 32.2 g/dL (ref 30.0–36.0)
MCV: 90.8 fL (ref 80.0–100.0)
Monocytes Absolute: 0.5 10*3/uL (ref 0.1–1.0)
Monocytes Relative: 8 %
Neutro Abs: 4 10*3/uL (ref 1.7–7.7)
Neutrophils Relative %: 64 %
Platelets: 225 10*3/uL (ref 150–400)
RBC: 4.14 MIL/uL (ref 3.87–5.11)
RDW: 14 % (ref 11.5–15.5)
WBC: 6.2 10*3/uL (ref 4.0–10.5)
nRBC: 0 % (ref 0.0–0.2)

## 2019-06-16 LAB — LACTATE DEHYDROGENASE: LDH: 133 U/L (ref 98–192)

## 2019-06-16 LAB — VITAMIN D 25 HYDROXY (VIT D DEFICIENCY, FRACTURES): Vit D, 25-Hydroxy: 49.02 ng/mL (ref 30–100)

## 2019-06-16 LAB — IRON AND TIBC
Iron: 32 ug/dL (ref 28–170)
Saturation Ratios: 11 % (ref 10.4–31.8)
TIBC: 285 ug/dL (ref 250–450)
UIBC: 253 ug/dL

## 2019-06-16 LAB — VITAMIN B12: Vitamin B-12: 395 pg/mL (ref 180–914)

## 2019-06-16 LAB — FERRITIN: Ferritin: 59 ng/mL (ref 11–307)

## 2019-06-21 ENCOUNTER — Emergency Department: Payer: Medicare Other

## 2019-06-21 ENCOUNTER — Other Ambulatory Visit: Payer: Self-pay

## 2019-06-21 ENCOUNTER — Emergency Department
Admission: EM | Admit: 2019-06-21 | Discharge: 2019-06-21 | Disposition: A | Discharge status: Home / Self Care | Payer: Medicare Other | Attending: Emergency Medicine | Admitting: Emergency Medicine

## 2019-06-21 ENCOUNTER — Encounter: Payer: Self-pay | Admitting: Emergency Medicine

## 2019-06-21 DIAGNOSIS — M79604 Pain in right leg: Secondary | ICD-10-CM | POA: Diagnosis not present

## 2019-06-21 DIAGNOSIS — M7121 Synovial cyst of popliteal space [Baker], right knee: Secondary | ICD-10-CM | POA: Diagnosis not present

## 2019-06-21 DIAGNOSIS — M79661 Pain in right lower leg: Secondary | ICD-10-CM | POA: Diagnosis not present

## 2019-06-21 DIAGNOSIS — M25561 Pain in right knee: Secondary | ICD-10-CM | POA: Diagnosis not present

## 2019-06-21 NOTE — ED Triage Notes (Signed)
Pain back of right leg for a week.  No injry .  Says she tried muscle relaxer with out relief.  Says increased with wt bearing.

## 2019-06-21 NOTE — Discharge Instructions (Addendum)
Follow-up with [facility] orthopedics.  Please call for an appointment.  Apply ice to the back of the knee.  Tylenol and ibuprofen will also help.  The cyst may have enlarged which is causing the pain.  Orthopedics can address this.  Or f/u with [facility]

## 2019-06-21 NOTE — ED Provider Notes (Signed)
[facility] Emergency Department Provider Note  ____________________________________________   None    (approximate)   I have reviewed the triage vital signs and the nursing notes.   Patient has been triaged with a MSE exam performed by myself at a minimum. Based on symptoms and screening exam, patient may receive a more in-depth exam, labs, imaging as detailed below. Patients have been advised of this setting and exam type at the time of patient interview.    HISTORY  Chief Complaint Leg Pain    HPI [name] is a 68 y.o. female presents to the emergency department with a complaint of right lower leg pain.  Symptoms for a week.  No recent travel or long distance driving.  Patient states she has tried all of her regular medications for her back such as pain medicine muscle relaxers and hot and cold compresses without relief.  She says pain is worse with walking.  She denies any chest pain, shortness of breath, fever, chills.  No known injury..   Patient will receive a medical screening exam as detailed below.  Based off of this exam, more in depth exam, labs, imaging will be performed as needed for complaint.  Patient care will be eventually transferred to another provider in the emergency department for final exam, diagnosis and disposition.    Past Medical History:  Diagnosis Date  . Arthritis   . B12 deficiency 07/09/2015  . Crohn disease ([place]) JULY 2011 FEDA   PERSISTENT TI ULCERS seen on CE, NO NSAID USE  . Endometriosis    HISTORY  LEADING TO HYSTERECTOMY  . Fibromyalgia   . Fibromyalgia 12/09/2013  . Gastric ulcer   . GERD (gastroesophageal reflux disease)   . Headache   . Helicobacter pylori gastritis    HISTORY  . History of hiatal hernia   . Hives   . Iron (Fe) deficiency anemia 2009   JAN 2012 NL HB & FERRITIN  . Obesity   . Osteoporosis 11/20/2014  . Plantar fasciitis     Patient Active Problem List   Diagnosis Date Noted   . Closed fracture of proximal end of left fibula 10/20/17  12/02/2017  . Thyroid nodule 09/23/2017  . Edema of left lower extremity 08/25/2017  . [place]clap headache 09/06/2015  . Vision changes 09/06/2015  . Dizziness 09/06/2015  . New onset of headaches after age 10 09/06/2015  . B12 deficiency 07/09/2015  . Acute bilateral lower abdominal pain 12/08/2014  . Osteoporosis 11/20/2014  . Chronic back pain 11/10/2014  . GERD (gastroesophageal reflux disease) 06/16/2014  . Impaired fasting glucose 03/29/2014  . Fibromyalgia 12/09/2013  . Crohn's ileitis ([place]) 01/17/2011  . Iron deficiency anemia 04/10/2010    Past Surgical History:  Procedure Laterality Date  . ABDOMINAL HYSTERECTOMY    . CATARACT EXTRACTION W/PHACO Right 12/05/2015   Procedure: CATARACT EXTRACTION PHACO AND INTRAOCULAR LENS PLACEMENT (IOC);  Surgeon: Birder Robson, MD;  Location: [facility] ORS;  Service: Ophthalmology;  Laterality: Right;  [place] 00:59.9 AP% 21.9 CDE 13.10 fluid pack lot # [id number]  . CATARACT EXTRACTION W/PHACO Left 06/24/2016   Procedure: CATARACT EXTRACTION PHACO AND INTRAOCULAR LENS PLACEMENT LEFT EYE; CDE:  8.33;  Surgeon: Tonny Branch, MD;  Location: [facility];  Service: Ophthalmology;  Laterality: Left;  . COLONOSCOPY    . ESOPHAGOGASTRODUODENOSCOPY  10/14/2007   Esophagus showed 2 cm pink tongue seen extending from the GE junction.  Biopsies obtained via cold forceps.  Otherwise the  esophagus was without erosions,  mass, ulceration, or stricture / . Large hiatal hernia pouch with linear erosions, and two 3-6 mmulcers seen in the pouch.  Biopsies obtained via cold forceps to  evaluate for H. pylori gastritis or malignancy  . Ileocolonoscopy  10/14/2007   Multiple 1-3 mm terminal ileum ulcers biopsied  The ulcers seemed to only involve the last 5 cm of her terminal ileum, and 10 cm of her ileum was visualized.  Otherwise the colon  was without polyps, masses, diverticula, or AVMs.  She had a normal  retroflexed view of the rectum  . OVARIAN CYST REMOVAL    . TOTAL ABDOMINAL HYSTERECTOMY W/ BILATERAL SALPINGOOPHORECTOMY    . UPPER GASTROINTESTINAL ENDOSCOPY  JULY 2011 DIARRHEA/FEDA    Bx-H.PYLORI GASTRITIS, NL DUODENUM    Prior to Admission medications   Medication Sig Start Date End Date Taking? Authorizing Provider  acetaminophen (TYLENOL) 325 MG tablet Take 650 mg by mouth as needed for moderate pain or headache.     [provider]  ergocalciferol (VITAMIN D2) 1.25 MG (50000 UT) capsule Take 1 capsule (50,000 Units total) by mouth once a week. 03/24/19   Glennie Isle, NP-C    Allergies Cefzil [cefprozil], Celebrex [celecoxib], and Sulfonamide derivatives  Family History  Problem Relation Age of Onset  . Diabetes Mother   . Cancer Father   . Diabetes Father   . Diabetes Sister   . Diabetes Brother   . Colon polyps Neg Hx   . Colon cancer Neg Hx   . Stroke Neg Hx     Social History Social History   Tobacco Use  . Smoking status: Never Smoker  . Smokeless tobacco: Never Used  Substance Use Topics  . Alcohol use: No  . Drug use: No    Review of Systems Constitutional: Denies fever ENT: Denies nasal congestion/rhinorhea.  Denies sore throat Cardiovascular: Denies chest pain. Respiratory: Denies cough.  Denies shortness of breath/difficulty breathing Gastroenterology: Denies abdominal pain Musculoskeletal: Positive for musculoskeletal pain Integumentary: Negative for rash. Neurological: No focal weakness nor numbness.   ____________________________________________   PHYSICAL EXAM:  VITAL SIGNS: ED Triage Vitals [06/21/19 1403]  Enc Vitals Group     BP (!) 137/52     Pulse Rate 86     Resp 18     Temp 98.9 F (37.2 C)     Temp Source Oral     SpO2 96 %     Weight 210 lb (95.3 kg)     Height 5' 4"  (1.626 m)     Head Circumference      Peak Flow      Pain Score      Pain Loc      Pain Edu?      Excl. in [place]?     Constitutional:  Alert and oriented. Generally well appearing and in no acute distress. Eyes: Conjunctivae are normal.  Nose: No significant congestion/rhinnorhea. Mouth: No gross oropharyngeal edema.  Neck: No stridor.  No meningeal signs.   Cardiovascular: Grossly normal heart sounds. Respiratory: Normal respiratory effort without significant tachypnea and no observed retractions. Lungs CTA Musculoskeletal: No gross deformities of extremities.  Positive for right lower leg tenderness.  Exam is limited as patient has her jeans on. Neurologic:  Normal speech and language. No gross focal neurologic deficits are appreciated.  Skin:  Skin is warm, dry and intact. No rash noted.    ____________________________________________   LABS (all labs ordered are listed, but only abnormal results are displayed)  Labs Reviewed -  No data to display  ____________________________________________   RADIOLOGY Ultrasound the right lower extremity  Official radiology report(s): No results found.  ____________________________________________    INITIAL IMPRESSION / MDM / ASSESSMENT AND PLAN / ED COURSE  As part of my medical decision making, I reviewed the following data within the [facility] notes reviewed and incorporated, Notes from prior ED visits and [place] Controlled Substance Database      Clinical Impression: Right lower leg pain, rule out DVT    Patient has been screened based based on their arrival complaint, evaluated for an emergent condition, and at a minimum has received a medical screening exam.  At this time, patient will receive further work-up as determined by medical screening exam.  Patient care will eventually be transferred to another provider in the emergency department for final diagnosis and disposition.    ____________________________________________  Note:  This document was prepared using Systems analyst and may include unintentional  dictation errors.    Versie Starks, PA-C 06/21/19 1414    Earleen Newport, MD 06/21/19 [phone]

## 2019-06-21 NOTE — ED Notes (Signed)
See triage note  Presents with pain behind right knee   States pain states about 1 week ago

## 2019-06-21 NOTE — ED Provider Notes (Signed)
Ultrasound of the right right leg shows a Baker's cyst.  No DVT.  Explained findings to the patient.  Explained her she needs follow-up with orthopedics for cortisone injection.  She is to continue to apply ice and take pain medication as needed.  Return to the emergency department worsening.  She was discharged in stable condition.  Diagnosis was Baker's cyst and right leg pain   Versie Starks, PA-C 06/21/19 1609    Earleen Newport, MD 06/22/19 [phone]

## 2019-06-23 ENCOUNTER — Other Ambulatory Visit (HOSPITAL_COMMUNITY): Payer: Medicare Other

## 2019-07-01 ENCOUNTER — Other Ambulatory Visit: Payer: Self-pay

## 2019-07-01 ENCOUNTER — Inpatient Hospital Stay (HOSPITAL_COMMUNITY): Payer: Medicare Other | Attending: Hematology | Admitting: Nurse Practitioner

## 2019-07-01 DIAGNOSIS — D509 Iron deficiency anemia, unspecified: Secondary | ICD-10-CM | POA: Diagnosis present

## 2019-07-01 DIAGNOSIS — K5090 Crohn's disease, unspecified, without complications: Secondary | ICD-10-CM | POA: Insufficient documentation

## 2019-07-01 DIAGNOSIS — D500 Iron deficiency anemia secondary to blood loss (chronic): Secondary | ICD-10-CM

## 2019-07-01 DIAGNOSIS — Z79899 Other long term (current) drug therapy: Secondary | ICD-10-CM | POA: Insufficient documentation

## 2019-07-01 DIAGNOSIS — E538 Deficiency of other specified B group vitamins: Secondary | ICD-10-CM | POA: Insufficient documentation

## 2019-07-01 DIAGNOSIS — E041 Nontoxic single thyroid nodule: Secondary | ICD-10-CM | POA: Insufficient documentation

## 2019-07-01 DIAGNOSIS — R5383 Other fatigue: Secondary | ICD-10-CM | POA: Diagnosis not present

## 2019-07-01 DIAGNOSIS — E559 Vitamin D deficiency, unspecified: Secondary | ICD-10-CM | POA: Diagnosis not present

## 2019-07-01 NOTE — Progress Notes (Signed)
[name], [place] [address]   CLINIC:  Medical Oncology/Hematology  PCP:  Kathyrn Drown, MD [address] [place] [address] [phone]   REASON FOR VISIT: Follow-up for iron deficiency anemia  CURRENT THERAPY: Intermittent iron infusions   INTERVAL HISTORY:  [name] 69 y.o. female returns for routine follow-up for iron deficiency anemia.  Patient reports she is starting to feel fatigued again since her iron infusions.  She denies any bright red bleeding per rectum or melena.  She denies any easy bruising.  She denies any new pain. Denies any nausea, vomiting, or diarrhea. Denies any new pains. Had not noticed any recent bleeding such as epistaxis, hematuria or hematochezia. Denies recent chest pain on exertion, shortness of breath on minimal exertion, pre-syncopal episodes, or palpitations. Denies any numbness or tingling in hands or feet. Denies any recent fevers, infections, or recent hospitalizations. Patient reports appetite at 100% and energy level at 50%.  She is eating well maintaining her weight at this time.     REVIEW OF SYSTEMS:  Review of Systems  Constitutional: Positive for fatigue.  All other systems reviewed and are negative.    PAST MEDICAL/SURGICAL HISTORY:  Past Medical History:  Diagnosis Date  . Arthritis   . B12 deficiency 07/09/2015  . Crohn disease ([place]) JULY 2011 FEDA   PERSISTENT TI ULCERS seen on CE, NO NSAID USE  . Endometriosis    HISTORY  LEADING TO HYSTERECTOMY  . Fibromyalgia   . Fibromyalgia 12/09/2013  . Gastric ulcer   . GERD (gastroesophageal reflux disease)   . Headache   . Helicobacter pylori gastritis    HISTORY  . History of hiatal hernia   . Hives   . Iron (Fe) deficiency anemia 2009   JAN 2012 NL HB & FERRITIN  . Obesity   . Osteoporosis 11/20/2014  . Plantar fasciitis    Past Surgical History:  Procedure Laterality Date  . ABDOMINAL HYSTERECTOMY    . CATARACT  EXTRACTION W/PHACO Right 12/05/2015   Procedure: CATARACT EXTRACTION PHACO AND INTRAOCULAR LENS PLACEMENT (IOC);  Surgeon: Birder Robson, MD;  Location: [facility] ORS;  Service: Ophthalmology;  Laterality: Right;  [place] 00:59.9 AP% 21.9 CDE 13.10 fluid pack lot # [id number]  . CATARACT EXTRACTION W/PHACO Left 06/24/2016   Procedure: CATARACT EXTRACTION PHACO AND INTRAOCULAR LENS PLACEMENT LEFT EYE; CDE:  8.33;  Surgeon: Tonny Branch, MD;  Location: [facility];  Service: Ophthalmology;  Laterality: Left;  . COLONOSCOPY    . ESOPHAGOGASTRODUODENOSCOPY  10/14/2007   Esophagus showed 2 cm pink tongue seen extending from the GE junction.  Biopsies obtained via cold forceps.  Otherwise the  esophagus was without erosions, mass, ulceration, or stricture / . Large hiatal hernia pouch with linear erosions, and two 3-6 mmulcers seen in the pouch.  Biopsies obtained via cold forceps to  evaluate for H. pylori gastritis or malignancy  . Ileocolonoscopy  10/14/2007   Multiple 1-3 mm terminal ileum ulcers biopsied  The ulcers seemed to only involve the last 5 cm of her terminal ileum, and 10 cm of her ileum was visualized.  Otherwise the colon  was without polyps, masses, diverticula, or AVMs.  She had a normal retroflexed view of the rectum  . OVARIAN CYST REMOVAL    . TOTAL ABDOMINAL HYSTERECTOMY W/ BILATERAL SALPINGOOPHORECTOMY    . UPPER GASTROINTESTINAL ENDOSCOPY  JULY 2011 DIARRHEA/FEDA    Bx-H.PYLORI GASTRITIS, NL DUODENUM     SOCIAL HISTORY:  Social History   Socioeconomic History  . Marital status: Married    Spouse name: Alvester Chou   . Number of children: 3  . Years of education: 88  . Highest education level: Not on file  Occupational History  . Occupation: Retired  Scientific laboratory technician  . Financial resource strain: Not on file  . Food insecurity    Worry: Not on file    Inability: Not on file  . Transportation needs    Medical: Not on file    Non-medical: Not on file  Tobacco Use  . Smoking status: Never  Smoker  . Smokeless tobacco: Never Used  Substance and Sexual Activity  . Alcohol use: No  . Drug use: No  . Sexual activity: Yes    Birth control/protection: Surgical  Lifestyle  . Physical activity    Days per week: Not on file    Minutes per session: Not on file  . Stress: Not on file  Relationships  . Social Herbalist on phone: Not on file    Gets together: Not on file    Attends religious service: Not on file    Active member of club or organization: Not on file    Attends meetings of clubs or organizations: Not on file    Relationship status: Not on file  . Intimate partner violence    Fear of current or ex partner: Not on file    Emotionally abused: Not on file    Physically abused: Not on file    Forced sexual activity: Not on file  Other Topics Concern  . Not on file  Social History Narrative   Lives w/ husband   Caffeine use: Drinks 1-2 cups coffee per day    FAMILY HISTORY:  Family History  Problem Relation Age of Onset  . Diabetes Mother   . Cancer Father   . Diabetes Father   . Diabetes Sister   . Diabetes Brother   . Colon polyps Neg Hx   . Colon cancer Neg Hx   . Stroke Neg Hx     CURRENT MEDICATIONS:  Outpatient Encounter Medications as of 07/01/2019  Medication Sig  . ergocalciferol (VITAMIN D2) 1.25 MG (50000 UT) capsule Take 1 capsule (50,000 Units total) by mouth once a week.  . vitamin B-12 (CYANOCOBALAMIN) 500 MCG tablet Take 500 mcg by mouth daily.  Marland Kitchen acetaminophen (TYLENOL) 325 MG tablet Take 650 mg by mouth as needed for moderate pain or headache.    Facility-Administered Encounter Medications as of 07/01/2019  Medication  . 0.9 %  sodium chloride infusion    ALLERGIES:  Allergies  Allergen Reactions  . Cefzil [Cefprozil] Rash  . Celebrex [Celecoxib] Hives and Itching  . Sulfonamide Derivatives Itching     PHYSICAL EXAM:  ECOG Performance status: 1  Vitals:   07/01/19 1426  BP: (!) 148/35  Pulse: 82  Resp: 18   Temp: (!) 97.3 F (36.3 C)  SpO2: 95%   Filed Weights   07/01/19 1426  Weight: 215 lb 6.4 oz (97.7 kg)    Physical Exam Constitutional:      Appearance: Normal appearance. She is normal weight.  Cardiovascular:     Rate and Rhythm: Normal rate and regular rhythm.     Heart sounds: Normal heart sounds.  Pulmonary:     Effort: Pulmonary effort is normal.     Breath sounds: Normal breath sounds.  Abdominal:     General: Bowel sounds are normal.  Palpations: Abdomen is soft.  Musculoskeletal: Normal range of motion.  Skin:    General: Skin is warm.  Neurological:     Mental Status: She is alert and oriented to person, place, and time. Mental status is at baseline.  Psychiatric:        Mood and Affect: Mood normal.        Behavior: Behavior normal.        Thought Content: Thought content normal.        Judgment: Judgment normal.      LABORATORY DATA:  I have reviewed the labs as listed.  CBC    Component Value Date/Time   WBC 6.2 06/16/2019 1122   RBC 4.14 06/16/2019 1122   HGB 12.1 06/16/2019 1122   HCT 37.6 06/16/2019 1122   PLT 225 06/16/2019 1122   MCV 90.8 06/16/2019 1122   MCH 29.2 06/16/2019 1122   MCHC 32.2 06/16/2019 1122   RDW 14.0 06/16/2019 1122   LYMPHSABS 1.6 06/16/2019 1122   MONOABS 0.5 06/16/2019 1122   EOSABS 0.1 06/16/2019 1122   BASOSABS 0.0 06/16/2019 1122   CMP Latest Ref Rng & Units 06/16/2019 03/17/2019 12/30/2018  Glucose 70 - 99 mg/dL 97 105(H) 186(H)  BUN 8 - 23 mg/dL 22 17 23   Creatinine 0.44 - 1.00 mg/dL 0.64 0.74 0.63  Sodium 135 - 145 mmol/L 141 139 139  Potassium 3.5 - 5.1 mmol/L 3.9 4.1 4.1  Chloride 98 - 111 mmol/L 107 107 108  CO2 22 - 32 mmol/L 23 22 23   Calcium 8.9 - 10.3 mg/dL 8.9 8.9 8.4(L)  Total Protein 6.5 - 8.1 g/dL 7.4 7.4 7.1  Total Bilirubin 0.3 - 1.2 mg/dL 0.5 0.4 0.4  Alkaline Phos 38 - 126 U/L 98 96 87  AST 15 - 41 U/L 16 17 15   ALT 0 - 44 U/L 17 18 14       I personally performed a face-to-face visit.   All questions were answered to patient's stated satisfaction. Encouraged patient to call with any new concerns or questions before his next visit to the cancer center and we can certain see him sooner, if needed.      ASSESSMENT & PLAN:   Iron deficiency anemia 1.  Iron deficiency anemia: - She has felt to have anemia due to chronic blood loss and malabsorption.   - She requires intermittent IV iron infusions.  Her last infusions were 04/02/2019 04/09/2019. - Labs done on 06/16/2019 showed her hemoglobin 12.1, ferritin 59, percent saturation 11, platelets 225, WBC 6.2, creatinine 0.64 -Due to her extreme fatigue we will give her iron infusions. - We will set her up with 2 more infusions of Feraheme. - She will follow-up in 3 months with repeat labs.  2.  B12 deficiency: - She is on B12 injections monthly. - Labs done on 06/16/2019 showed vitamin B12 level at 395 -We recommend she continue on the B12 injection replacement at this time. -She last received her B12 injection at her PCP on 03/23/2019 -We will also place her on oral B12 sublingual thousand milligrams daily - We will repeat labs when she follows up in 3 months.  3.  Vitamin D deficiency: -Labs done on 06/16/2019 showed her vitamin D level at 49.02 -She will continue taking the 50,000 units of vitamin D weekly. -We will recheck her levels in 3 months.  4.  Crohn's disease: - She is closely followed by GI due to her recurrent IDA. - She missed her recent follow-up with her  GI doctor. -She is due for another colonoscopy she wishes to wait a couple months and she will ask for a referral.  5.  Thyroid nodule: - On 08/2017 a thyroid nodule was found by ultrasound. - A biopsy was done 09/2017 that showed benign nodule. - Patient should follow-up with PCP or endocrinology as directed.  6.  Health maintenance: - Last colonoscopy was done 02/22/2010.  She is recommended to follow-up with GI. - Her last mammogram was in 2006.  It  is recommended to schedule a mammogram.      Orders placed this encounter:  Orders Placed This Encounter  Procedures  . Lactate dehydrogenase  . CBC with Differential/Platelet  . Comprehensive metabolic panel  . Ferritin  . Iron and TIBC  . Vitamin B12  . Vitamin D 25 hydroxy  . Folate      Francene Finders, FNP-C [place] [phone]

## 2019-07-01 NOTE — Patient Instructions (Signed)
[facility] at [facility] Discharge Instructions  Follow up in 3 months with lab s   Thank you for choosing [facility] at [facility] to provide your oncology and hematology care.  To afford each patient quality time with our provider, please arrive at least 15 minutes before your scheduled appointment time.   If you have a lab appointment with the Cancer Center please come in thru the Main Entrance and check in at the main information desk.  You need to re-schedule your appointment should you arrive 10 or more minutes late.  We strive to give you quality time with our providers, and arriving late affects you and other patients whose appointments are after yours.  Also, if you no show three or more times for appointments you may be dismissed from the clinic at the providers discretion.     Again, thank you for choosing [facility].  Our hope is that these requests will decrease the amount of time that you wait before being seen by our physicians.       _____________________________________________________________  Should you have questions after your visit to [facility], please contact our office at [phone] between the hours of 8:00 a.m. and 4:30 p.m.  Voicemails left after 4:00 p.m. will not be returned until the following business day.  For prescription refill requests, have your pharmacy contact our office and allow 72 hours.    Due to Covid, you will need to wear a mask upon entering the hospital. If you do not have a mask, a mask will be given to you at the Main Entrance upon arrival. For doctor visits, patients may have 1 support person with them. For treatment visits, patients can not have anyone with them due to social distancing guidelines and our immunocompromised population.      

## 2019-07-01 NOTE — Assessment & Plan Note (Addendum)
1.  Iron deficiency anemia: - She has felt to have anemia due to chronic blood loss and malabsorption.   - She requires intermittent IV iron infusions.  Her last infusions were 04/02/2019 04/09/2019. - Labs done on 06/16/2019 showed her hemoglobin 12.1, ferritin 59, percent saturation 11, platelets 225, WBC 6.2, creatinine 0.64 -Due to her extreme fatigue we will give her iron infusions. - We will set her up with 2 more infusions of Feraheme. - She will follow-up in 3 months with repeat labs.  2.  B12 deficiency: - She is on B12 injections monthly. - Labs done on 06/16/2019 showed vitamin B12 level at 395 -We recommend she continue on the B12 injection replacement at this time. -She last received her B12 injection at her PCP on 03/23/2019 -We will also place her on oral B12 sublingual thousand milligrams daily - We will repeat labs when she follows up in 3 months.  3.  Vitamin D deficiency: -Labs done on 06/16/2019 showed her vitamin D level at 49.02 -She will continue taking the 50,000 units of vitamin D weekly. -We will recheck her levels in 3 months.  4.  Crohn's disease: - She is closely followed by GI due to her recurrent IDA. - She missed her recent follow-up with her GI doctor. -She is due for another colonoscopy she wishes to wait a couple months and she will ask for a referral.  5.  Thyroid nodule: - On 08/2017 a thyroid nodule was found by ultrasound. - A biopsy was done 09/2017 that showed benign nodule. - Patient should follow-up with PCP or endocrinology as directed.  6.  Health maintenance: - Last colonoscopy was done 02/22/2010.  She is recommended to follow-up with GI. - Her last mammogram was in 2006.  It is recommended to schedule a mammogram.

## 2019-07-14 ENCOUNTER — Ambulatory Visit (HOSPITAL_COMMUNITY): Payer: Medicare Other

## 2019-07-21 ENCOUNTER — Ambulatory Visit (HOSPITAL_COMMUNITY): Payer: Medicare Other

## 2019-08-02 DIAGNOSIS — M7121 Synovial cyst of popliteal space [Baker], right knee: Secondary | ICD-10-CM | POA: Diagnosis not present

## 2019-08-02 DIAGNOSIS — S83241A Other tear of medial meniscus, current injury, right knee, initial encounter: Secondary | ICD-10-CM | POA: Diagnosis not present

## 2019-08-02 DIAGNOSIS — M94261 Chondromalacia, right knee: Secondary | ICD-10-CM | POA: Diagnosis not present

## 2019-08-02 DIAGNOSIS — S83411A Sprain of medial collateral ligament of right knee, initial encounter: Secondary | ICD-10-CM | POA: Diagnosis not present

## 2019-08-02 DIAGNOSIS — M660 Rupture of popliteal cyst: Secondary | ICD-10-CM | POA: Diagnosis not present

## 2019-08-02 DIAGNOSIS — M25561 Pain in right knee: Secondary | ICD-10-CM | POA: Diagnosis not present

## 2019-08-02 DIAGNOSIS — M7051 Other bursitis of knee, right knee: Secondary | ICD-10-CM | POA: Diagnosis not present

## 2019-08-03 DIAGNOSIS — M7121 Synovial cyst of popliteal space [Baker], right knee: Secondary | ICD-10-CM | POA: Diagnosis not present

## 2019-08-03 DIAGNOSIS — S83241A Other tear of medial meniscus, current injury, right knee, initial encounter: Secondary | ICD-10-CM | POA: Diagnosis not present

## 2019-08-03 DIAGNOSIS — S83411A Sprain of medial collateral ligament of right knee, initial encounter: Secondary | ICD-10-CM | POA: Diagnosis not present

## 2019-08-03 DIAGNOSIS — M1711 Unilateral primary osteoarthritis, right knee: Secondary | ICD-10-CM | POA: Diagnosis not present

## 2019-08-05 ENCOUNTER — Encounter (HOSPITAL_COMMUNITY): Payer: Self-pay

## 2019-08-05 ENCOUNTER — Inpatient Hospital Stay (HOSPITAL_COMMUNITY): Payer: Medicare Other | Attending: Hematology

## 2019-08-05 ENCOUNTER — Other Ambulatory Visit: Payer: Self-pay

## 2019-08-05 VITALS — BP 136/61 | HR 65 | Temp 96.9°F | Resp 18

## 2019-08-05 DIAGNOSIS — R5383 Other fatigue: Secondary | ICD-10-CM | POA: Diagnosis not present

## 2019-08-05 DIAGNOSIS — D509 Iron deficiency anemia, unspecified: Secondary | ICD-10-CM | POA: Insufficient documentation

## 2019-08-05 DIAGNOSIS — D500 Iron deficiency anemia secondary to blood loss (chronic): Secondary | ICD-10-CM

## 2019-08-05 DIAGNOSIS — Z79899 Other long term (current) drug therapy: Secondary | ICD-10-CM | POA: Diagnosis not present

## 2019-08-05 DIAGNOSIS — E041 Nontoxic single thyroid nodule: Secondary | ICD-10-CM | POA: Insufficient documentation

## 2019-08-05 DIAGNOSIS — E559 Vitamin D deficiency, unspecified: Secondary | ICD-10-CM | POA: Insufficient documentation

## 2019-08-05 DIAGNOSIS — E538 Deficiency of other specified B group vitamins: Secondary | ICD-10-CM | POA: Diagnosis not present

## 2019-08-05 DIAGNOSIS — K5090 Crohn's disease, unspecified, without complications: Secondary | ICD-10-CM | POA: Insufficient documentation

## 2019-08-05 MED ORDER — SODIUM CHLORIDE 0.9 % IV SOLN: INTRAVENOUS | Status: DC

## 2019-08-05 MED ORDER — SODIUM CHLORIDE 0.9 % IV SOLN
510.0000 mg | Freq: Once | INTRAVENOUS | Status: AC
  Administered 2019-08-05: 15:00:00 510 mg via INTRAVENOUS
  Filled 2019-08-05: qty 510

## 2019-08-05 NOTE — Progress Notes (Signed)
Patient tolerated iron infusion with no complaints voiced.  Peripheral IV site clean and dry with good blood return noted before and after infusion.  Band aid applied.  VSS with discharge and left ambulatory with no s/s of distress noted.  

## 2019-08-12 ENCOUNTER — Inpatient Hospital Stay (HOSPITAL_COMMUNITY): Payer: Medicare Other

## 2019-08-12 ENCOUNTER — Other Ambulatory Visit: Payer: Self-pay

## 2019-08-12 VITALS — BP 107/56 | HR 80 | Temp 96.8°F | Resp 16

## 2019-08-12 DIAGNOSIS — E538 Deficiency of other specified B group vitamins: Secondary | ICD-10-CM | POA: Diagnosis not present

## 2019-08-12 DIAGNOSIS — E041 Nontoxic single thyroid nodule: Secondary | ICD-10-CM | POA: Diagnosis not present

## 2019-08-12 DIAGNOSIS — D509 Iron deficiency anemia, unspecified: Secondary | ICD-10-CM | POA: Diagnosis not present

## 2019-08-12 DIAGNOSIS — R5383 Other fatigue: Secondary | ICD-10-CM | POA: Diagnosis not present

## 2019-08-12 DIAGNOSIS — E559 Vitamin D deficiency, unspecified: Secondary | ICD-10-CM | POA: Diagnosis not present

## 2019-08-12 DIAGNOSIS — K5090 Crohn's disease, unspecified, without complications: Secondary | ICD-10-CM | POA: Diagnosis not present

## 2019-08-12 DIAGNOSIS — D500 Iron deficiency anemia secondary to blood loss (chronic): Secondary | ICD-10-CM

## 2019-08-12 MED ORDER — SODIUM CHLORIDE 0.9 % IV SOLN
510.0000 mg | Freq: Once | INTRAVENOUS | Status: AC
  Administered 2019-08-12: 15:00:00 510 mg via INTRAVENOUS
  Filled 2019-08-12: qty 510

## 2019-08-12 MED ORDER — SODIUM CHLORIDE 0.9 % IV SOLN: INTRAVENOUS | Status: DC

## 2019-08-12 NOTE — Progress Notes (Signed)
Iron given per orders. Patient tolerated it well without problems. Vitals stable and discharged home from clinic ambulatory. Follow up as scheduled.

## 2019-08-20 DIAGNOSIS — M1711 Unilateral primary osteoarthritis, right knee: Secondary | ICD-10-CM | POA: Diagnosis not present

## 2019-08-20 DIAGNOSIS — M25561 Pain in right knee: Secondary | ICD-10-CM | POA: Diagnosis not present

## 2019-08-24 DIAGNOSIS — M25561 Pain in right knee: Secondary | ICD-10-CM | POA: Diagnosis not present

## 2019-08-24 DIAGNOSIS — M1711 Unilateral primary osteoarthritis, right knee: Secondary | ICD-10-CM | POA: Diagnosis not present

## 2019-08-27 DIAGNOSIS — M25561 Pain in right knee: Secondary | ICD-10-CM | POA: Diagnosis not present

## 2019-08-27 DIAGNOSIS — M1711 Unilateral primary osteoarthritis, right knee: Secondary | ICD-10-CM | POA: Diagnosis not present

## 2019-09-03 DIAGNOSIS — M1711 Unilateral primary osteoarthritis, right knee: Secondary | ICD-10-CM | POA: Diagnosis not present

## 2019-09-03 DIAGNOSIS — M25561 Pain in right knee: Secondary | ICD-10-CM | POA: Diagnosis not present

## 2019-09-07 DIAGNOSIS — M1711 Unilateral primary osteoarthritis, right knee: Secondary | ICD-10-CM | POA: Diagnosis not present

## 2019-09-07 DIAGNOSIS — M25561 Pain in right knee: Secondary | ICD-10-CM | POA: Diagnosis not present

## 2019-09-10 DIAGNOSIS — M1711 Unilateral primary osteoarthritis, right knee: Secondary | ICD-10-CM | POA: Diagnosis not present

## 2019-09-10 DIAGNOSIS — M25561 Pain in right knee: Secondary | ICD-10-CM | POA: Diagnosis not present

## 2019-09-13 DIAGNOSIS — M1711 Unilateral primary osteoarthritis, right knee: Secondary | ICD-10-CM | POA: Diagnosis not present

## 2019-09-21 DIAGNOSIS — M1711 Unilateral primary osteoarthritis, right knee: Secondary | ICD-10-CM | POA: Diagnosis not present

## 2019-09-28 ENCOUNTER — Inpatient Hospital Stay (HOSPITAL_COMMUNITY): Payer: Medicare Other

## 2019-09-30 ENCOUNTER — Inpatient Hospital Stay (HOSPITAL_COMMUNITY): Payer: Medicare Other | Attending: Hematology

## 2019-09-30 ENCOUNTER — Other Ambulatory Visit: Payer: Self-pay

## 2019-09-30 DIAGNOSIS — Z8719 Personal history of other diseases of the digestive system: Secondary | ICD-10-CM | POA: Diagnosis not present

## 2019-09-30 DIAGNOSIS — R5383 Other fatigue: Secondary | ICD-10-CM | POA: Diagnosis not present

## 2019-09-30 DIAGNOSIS — Z90722 Acquired absence of ovaries, bilateral: Secondary | ICD-10-CM | POA: Insufficient documentation

## 2019-09-30 DIAGNOSIS — M1990 Unspecified osteoarthritis, unspecified site: Secondary | ICD-10-CM | POA: Diagnosis not present

## 2019-09-30 DIAGNOSIS — K5090 Crohn's disease, unspecified, without complications: Secondary | ICD-10-CM | POA: Diagnosis not present

## 2019-09-30 DIAGNOSIS — Z882 Allergy status to sulfonamides status: Secondary | ICD-10-CM | POA: Insufficient documentation

## 2019-09-30 DIAGNOSIS — Z886 Allergy status to analgesic agent status: Secondary | ICD-10-CM | POA: Diagnosis not present

## 2019-09-30 DIAGNOSIS — E559 Vitamin D deficiency, unspecified: Secondary | ICD-10-CM | POA: Insufficient documentation

## 2019-09-30 DIAGNOSIS — D500 Iron deficiency anemia secondary to blood loss (chronic): Secondary | ICD-10-CM

## 2019-09-30 DIAGNOSIS — Z79899 Other long term (current) drug therapy: Secondary | ICD-10-CM | POA: Insufficient documentation

## 2019-09-30 DIAGNOSIS — E538 Deficiency of other specified B group vitamins: Secondary | ICD-10-CM | POA: Insufficient documentation

## 2019-09-30 DIAGNOSIS — E041 Nontoxic single thyroid nodule: Secondary | ICD-10-CM | POA: Insufficient documentation

## 2019-09-30 DIAGNOSIS — Z833 Family history of diabetes mellitus: Secondary | ICD-10-CM | POA: Insufficient documentation

## 2019-09-30 DIAGNOSIS — Z809 Family history of malignant neoplasm, unspecified: Secondary | ICD-10-CM | POA: Insufficient documentation

## 2019-09-30 DIAGNOSIS — D509 Iron deficiency anemia, unspecified: Secondary | ICD-10-CM | POA: Insufficient documentation

## 2019-09-30 LAB — CBC WITH DIFFERENTIAL/PLATELET
Abs Immature Granulocytes: 0.02 10*3/uL (ref 0.00–0.07)
Basophils Absolute: 0 10*3/uL (ref 0.0–0.1)
Basophils Relative: 1 %
Eosinophils Absolute: 0.1 10*3/uL (ref 0.0–0.5)
Eosinophils Relative: 1 %
HCT: 38.3 % (ref 36.0–46.0)
Hemoglobin: 12.3 g/dL (ref 12.0–15.0)
Immature Granulocytes: 0 %
Lymphocytes Relative: 19 %
Lymphs Abs: 1.5 10*3/uL (ref 0.7–4.0)
MCH: 28.7 pg (ref 26.0–34.0)
MCHC: 32.1 g/dL (ref 30.0–36.0)
MCV: 89.3 fL (ref 80.0–100.0)
Monocytes Absolute: 0.5 10*3/uL (ref 0.1–1.0)
Monocytes Relative: 7 %
Neutro Abs: 5.5 10*3/uL (ref 1.7–7.7)
Neutrophils Relative %: 72 %
Platelets: 233 10*3/uL (ref 150–400)
RBC: 4.29 MIL/uL (ref 3.87–5.11)
RDW: 14 % (ref 11.5–15.5)
WBC: 7.6 10*3/uL (ref 4.0–10.5)
nRBC: 0 % (ref 0.0–0.2)

## 2019-09-30 LAB — COMPREHENSIVE METABOLIC PANEL
ALT: 20 U/L (ref 0–44)
AST: 18 U/L (ref 15–41)
Albumin: 4.1 g/dL (ref 3.5–5.0)
Alkaline Phosphatase: 91 U/L (ref 38–126)
Anion gap: 9 (ref 5–15)
BUN: 18 mg/dL (ref 8–23)
CO2: 23 mmol/L (ref 22–32)
Calcium: 9 mg/dL (ref 8.9–10.3)
Chloride: 108 mmol/L (ref 98–111)
Creatinine, Ser: 0.63 mg/dL (ref 0.44–1.00)
GFR calc Af Amer: >60 mL/min (ref >60)
GFR calc non Af Amer: >60 mL/min (ref >60)
Glucose, Bld: 146 mg/dL — ABNORMAL HIGH (ref 70–99)
Potassium: 3.7 mmol/L (ref 3.5–5.1)
Sodium: 140 mmol/L (ref 135–145)
Total Bilirubin: 0.5 mg/dL (ref 0.3–1.2)
Total Protein: 7.1 g/dL (ref 6.5–8.1)

## 2019-09-30 LAB — LACTATE DEHYDROGENASE: LDH: 147 U/L (ref 98–192)

## 2019-09-30 LAB — VITAMIN B12: Vitamin B-12: 472 pg/mL (ref 180–914)

## 2019-09-30 LAB — FERRITIN: Ferritin: 55 ng/mL (ref 11–307)

## 2019-09-30 LAB — IRON AND TIBC
Iron: 32 ug/dL (ref 28–170)
Saturation Ratios: 11 % (ref 10.4–31.8)
TIBC: 294 ug/dL (ref 250–450)
UIBC: 262 ug/dL

## 2019-09-30 LAB — FOLATE: Folate: 17.4 ng/mL (ref >5.9)

## 2019-09-30 LAB — VITAMIN D 25 HYDROXY (VIT D DEFICIENCY, FRACTURES): Vit D, 25-Hydroxy: 50.59 ng/mL (ref 30–100)

## 2019-10-01 DIAGNOSIS — M1711 Unilateral primary osteoarthritis, right knee: Secondary | ICD-10-CM | POA: Diagnosis not present

## 2019-10-05 ENCOUNTER — Other Ambulatory Visit: Payer: Self-pay

## 2019-10-05 ENCOUNTER — Inpatient Hospital Stay (HOSPITAL_BASED_OUTPATIENT_CLINIC_OR_DEPARTMENT_OTHER): Payer: Medicare Other | Admitting: Nurse Practitioner

## 2019-10-05 DIAGNOSIS — E041 Nontoxic single thyroid nodule: Secondary | ICD-10-CM | POA: Diagnosis not present

## 2019-10-05 DIAGNOSIS — E559 Vitamin D deficiency, unspecified: Secondary | ICD-10-CM | POA: Diagnosis not present

## 2019-10-05 DIAGNOSIS — D500 Iron deficiency anemia secondary to blood loss (chronic): Secondary | ICD-10-CM | POA: Diagnosis not present

## 2019-10-05 DIAGNOSIS — M1990 Unspecified osteoarthritis, unspecified site: Secondary | ICD-10-CM | POA: Diagnosis not present

## 2019-10-05 DIAGNOSIS — K5090 Crohn's disease, unspecified, without complications: Secondary | ICD-10-CM | POA: Diagnosis not present

## 2019-10-05 DIAGNOSIS — D509 Iron deficiency anemia, unspecified: Secondary | ICD-10-CM | POA: Diagnosis not present

## 2019-10-05 DIAGNOSIS — E538 Deficiency of other specified B group vitamins: Secondary | ICD-10-CM | POA: Diagnosis not present

## 2019-10-05 NOTE — Assessment & Plan Note (Addendum)
1.  Iron deficiency anemia: - She has felt to have anemia due to chronic blood loss and malabsorption.   - She requires intermittent IV iron infusions.  Her last infusions were 04/02/2019 04/09/2019. - Labs done on 09/30/2019 showed her hemoglobin 12.3, ferritin, percent saturation 11, platelets 233, WBC 7.6, creatinine 0.63 -Due to her extreme fatigue we will give her iron infusions. - We will set her up with 2 more infusions of Feraheme. - She will follow-up in 3 months with repeat labs.  2.  B12 deficiency: - She is on B12 injections monthly from her PCP - Labs done on 09/30/2019 showed vitamin B12 level at 472 -We recommend she continue on the B12 injection replacement at this time. -We will also place her on oral B12 sublingual thousand milligrams daily - We will repeat labs when she follows up in 3 months.  3.  Vitamin D deficiency: -Labs done on 09/30/2019 showed her vitamin D level at 50.59 -She will continue taking the 50,000 units of vitamin D weekly. -We will recheck her levels in 3 months.  4.  Crohn's disease: - She is closely followed by GI due to her recurrent IDA. - She missed her recent follow-up with her GI doctor. -She is due for another colonoscopy she wishes to wait a couple months and she will ask for a referral.  5.  Thyroid nodule: - On 08/2017 a thyroid nodule was found by ultrasound. - A biopsy was done 09/2017 that showed benign nodule. - Patient should follow-up with PCP or endocrinology as directed.  6.  Health maintenance: - Last colonoscopy was done 02/22/2010.  She is recommended to follow-up with GI. - Her last mammogram was in 2006.  It is recommended to schedule a mammogram.

## 2019-10-05 NOTE — Progress Notes (Signed)
[name], [place] [id number]   CLINIC:  Medical Oncology/Hematology  PCP:  Sara Drown, MD [address] [place] [address] [phone]   REASON FOR VISIT: Follow-up for iron deficiency anemia  CURRENT THERAPY: Intermittent iron infusions    INTERVAL HISTORY:  [name] 69 y.o. female returns for routine follow-up for iron deficiency.  Patient reports she is doing well since her last visit.  She denies any bright red blood per rectum or melena.  She denies any easy bruising or bleeding. Denies any nausea, vomiting, or diarrhea. Denies any new pains. Had not noticed any recent bleeding such as epistaxis, hematuria or hematochezia. Denies recent chest pain on exertion, shortness of breath on minimal exertion, pre-syncopal episodes, or palpitations. Denies any numbness or tingling in hands or feet. Denies any recent fevers, infections, or recent hospitalizations. Patient reports appetite at 100% and energy level at 25%.  She is eating well maintaining her weight at this time.    REVIEW OF SYSTEMS:  Review of Systems  All other systems reviewed and are negative.    PAST MEDICAL/SURGICAL HISTORY:  Past Medical History:  Diagnosis Date  . Arthritis   . B12 deficiency 07/09/2015  . Crohn disease ([place]) JULY 2011 FEDA   PERSISTENT TI ULCERS seen on CE, NO NSAID USE  . Endometriosis    HISTORY  LEADING TO HYSTERECTOMY  . Fibromyalgia   . Fibromyalgia 12/09/2013  . Gastric ulcer   . GERD (gastroesophageal reflux disease)   . Headache   . Helicobacter pylori gastritis    HISTORY  . History of hiatal hernia   . Hives   . Iron (Fe) deficiency anemia 2009   JAN 2012 NL HB & FERRITIN  . Obesity   . Osteoporosis 11/20/2014  . Plantar fasciitis    Past Surgical History:  Procedure Laterality Date  . ABDOMINAL HYSTERECTOMY    . CATARACT EXTRACTION W/PHACO Right 12/05/2015   Procedure: CATARACT EXTRACTION PHACO AND INTRAOCULAR  LENS PLACEMENT (IOC);  Surgeon: Birder Robson, MD;  Location: [facility] ORS;  Service: Ophthalmology;  Laterality: Right;  [place] 00:59.9 AP% 21.9 CDE 13.10 fluid pack lot # [id number]  . CATARACT EXTRACTION W/PHACO Left 06/24/2016   Procedure: CATARACT EXTRACTION PHACO AND INTRAOCULAR LENS PLACEMENT LEFT EYE; CDE:  8.33;  Surgeon: Tonny Branch, MD;  Location: [facility];  Service: Ophthalmology;  Laterality: Left;  . COLONOSCOPY    . ESOPHAGOGASTRODUODENOSCOPY  10/14/2007   Esophagus showed 2 cm pink tongue seen extending from the GE junction.  Biopsies obtained via cold forceps.  Otherwise the  esophagus was without erosions, mass, ulceration, or stricture / . Large hiatal hernia pouch with linear erosions, and two 3-6 mmulcers seen in the pouch.  Biopsies obtained via cold forceps to  evaluate for H. pylori gastritis or malignancy  . Ileocolonoscopy  10/14/2007   Multiple 1-3 mm terminal ileum ulcers biopsied  The ulcers seemed to only involve the last 5 cm of her terminal ileum, and 10 cm of her ileum was visualized.  Otherwise the colon  was without polyps, masses, diverticula, or AVMs.  She had a normal retroflexed view of the rectum  . OVARIAN CYST REMOVAL    . TOTAL ABDOMINAL HYSTERECTOMY W/ BILATERAL SALPINGOOPHORECTOMY    . UPPER GASTROINTESTINAL ENDOSCOPY  JULY 2011 DIARRHEA/FEDA    Bx-H.PYLORI GASTRITIS, NL DUODENUM     SOCIAL HISTORY:  Social History   Socioeconomic History  . Marital status: Married  Spouse name: Alvester Chou   . Number of children: 3  . Years of education: 78  . Highest education level: Not on file  Occupational History  . Occupation: Retired  Tobacco Use  . Smoking status: Never Smoker  . Smokeless tobacco: Never Used  Substance and Sexual Activity  . Alcohol use: No  . Drug use: No  . Sexual activity: Yes    Birth control/protection: Surgical  Other Topics Concern  . Not on file  Social History Narrative   Lives w/ husband   Caffeine use: Drinks 1-2 cups coffee  per day   Social Determinants of Health   Financial Resource Strain:   . Difficulty of Paying Living Expenses: Not on file  Food Insecurity:   . Worried About Charity fundraiser in the Last Year: Not on file  . Ran Out of Food in the Last Year: Not on file  Transportation Needs:   . Lack of Transportation (Medical): Not on file  . Lack of Transportation (Non-Medical): Not on file  Physical Activity:   . Days of Exercise per Week: Not on file  . Minutes of Exercise per Session: Not on file  Stress:   . Feeling of Stress : Not on file  Social Connections:   . Frequency of Communication with Friends and Family: Not on file  . Frequency of Social Gatherings with Friends and Family: Not on file  . Attends Religious Services: Not on file  . Active Member of Clubs or Organizations: Not on file  . Attends Archivist Meetings: Not on file  . Marital Status: Not on file  Intimate Partner Violence:   . Fear of Current or Ex-Partner: Not on file  . Emotionally Abused: Not on file  . Physically Abused: Not on file  . Sexually Abused: Not on file    FAMILY HISTORY:  Family History  Problem Relation Age of Onset  . Diabetes Mother   . Cancer Father   . Diabetes Father   . Diabetes Sister   . Diabetes Brother   . Colon polyps Neg Hx   . Colon cancer Neg Hx   . Stroke Neg Hx     CURRENT MEDICATIONS:  Outpatient Encounter Medications as of 10/05/2019  Medication Sig  . ergocalciferol (VITAMIN D2) 1.25 MG (50000 UT) capsule Take 1 capsule (50,000 Units total) by mouth once a week.  . vitamin B-12 (CYANOCOBALAMIN) 500 MCG tablet Take 500 mcg by mouth daily.  Marland Kitchen acetaminophen (TYLENOL) 325 MG tablet Take 650 mg by mouth as needed for moderate pain or headache.   . traMADol (ULTRAM) 50 MG tablet Take 50 mg by mouth every 6 (six) hours as needed.    Facility-Administered Encounter Medications as of 10/05/2019  Medication  . 0.9 %  sodium chloride infusion    ALLERGIES:    Allergies  Allergen Reactions  . Cefzil [Cefprozil] Rash  . Celebrex [Celecoxib] Hives and Itching  . Sulfonamide Derivatives Itching     PHYSICAL EXAM:  ECOG Performance status: 1  Vitals:   10/05/19 1507  BP: 122/63  Pulse: 93  Resp: 16  Temp: (!) 96.6 F (35.9 C)  SpO2: 92%   Filed Weights   10/05/19 1507  Weight: 214 lb 12.8 oz (97.4 kg)    Physical Exam Constitutional:      Appearance: Normal appearance. She is normal weight.  Cardiovascular:     Rate and Rhythm: Normal rate and regular rhythm.     Heart sounds: Normal heart  sounds.  Pulmonary:     Effort: Pulmonary effort is normal.     Breath sounds: Normal breath sounds.  Abdominal:     General: Bowel sounds are normal.     Palpations: Abdomen is soft.  Musculoskeletal:        General: Normal range of motion.  Skin:    General: Skin is warm.  Neurological:     Mental Status: She is alert and oriented to person, place, and time. Mental status is at baseline.  Psychiatric:        Mood and Affect: Mood normal.        Behavior: Behavior normal.        Thought Content: Thought content normal.        Judgment: Judgment normal.      LABORATORY DATA:  I have reviewed the labs as listed.  CBC    Component Value Date/Time   WBC 7.6 09/30/2019 1452   RBC 4.29 09/30/2019 1452   HGB 12.3 09/30/2019 1452   HCT 38.3 09/30/2019 1452   PLT 233 09/30/2019 1452   MCV 89.3 09/30/2019 1452   MCH 28.7 09/30/2019 1452   MCHC 32.1 09/30/2019 1452   RDW 14.0 09/30/2019 1452   LYMPHSABS 1.5 09/30/2019 1452   MONOABS 0.5 09/30/2019 1452   EOSABS 0.1 09/30/2019 1452   BASOSABS 0.0 09/30/2019 1452   CMP Latest Ref Rng & Units 09/30/2019 06/16/2019 03/17/2019  Glucose 70 - 99 mg/dL 146(H) 97 105(H)  BUN 8 - 23 mg/dL 18 22 17   Creatinine 0.44 - 1.00 mg/dL 0.63 0.64 0.74  Sodium 135 - 145 mmol/L 140 141 139  Potassium 3.5 - 5.1 mmol/L 3.7 3.9 4.1  Chloride 98 - 111 mmol/L 108 107 107  CO2 22 - 32 mmol/L 23 23 22    Calcium 8.9 - 10.3 mg/dL 9.0 8.9 8.9  Total Protein 6.5 - 8.1 g/dL 7.1 7.4 7.4  Total Bilirubin 0.3 - 1.2 mg/dL 0.5 0.5 0.4  Alkaline Phos 38 - 126 U/L 91 98 96  AST 15 - 41 U/L 18 16 17   ALT 0 - 44 U/L 20 17 18        I personally performed a face-to-face visit.  All questions were answered to patient's stated satisfaction. Encouraged patient to call with any new concerns or questions before his next visit to the cancer center and we can certain see him sooner, if needed.      ASSESSMENT & PLAN:   Iron deficiency anemia 1.  Iron deficiency anemia: - She has felt to have anemia due to chronic blood loss and malabsorption.   - She requires intermittent IV iron infusions.  Her last infusions were 04/02/2019 04/09/2019. - Labs done on 09/30/2019 showed her hemoglobin 12.3, ferritin, percent saturation 11, platelets 233, WBC 7.6, creatinine 0.63 -Due to her extreme fatigue we will give her iron infusions. - We will set her up with 2 more infusions of Feraheme. - She will follow-up in 3 months with repeat labs.  2.  B12 deficiency: - She is on B12 injections monthly from her PCP - Labs done on 09/30/2019 showed vitamin B12 level at 472 -We recommend she continue on the B12 injection replacement at this time. -We will also place her on oral B12 sublingual thousand milligrams daily - We will repeat labs when she follows up in 3 months.  3.  Vitamin D deficiency: -Labs done on 09/30/2019 showed her vitamin D level at 50.59 -She will continue taking the 50,000 units of  vitamin D weekly. -We will recheck her levels in 3 months.  4.  Crohn's disease: - She is closely followed by GI due to her recurrent IDA. - She missed her recent follow-up with her GI doctor. -She is due for another colonoscopy she wishes to wait a couple months and she will ask for a referral.  5.  Thyroid nodule: - On 08/2017 a thyroid nodule was found by ultrasound. - A biopsy was done 09/2017 that showed benign  nodule. - Patient should follow-up with PCP or endocrinology as directed.  6.  Health maintenance: - Last colonoscopy was done 02/22/2010.  She is recommended to follow-up with GI. - Her last mammogram was in 2006.  It is recommended to schedule a mammogram.      Orders placed this encounter:  Orders Placed This Encounter  Procedures  . Lactate dehydrogenase  . CBC with Differential/Platelet  . Comprehensive metabolic panel  . Ferritin  . Iron and TIBC  . Vitamin B12  . VITAMIN D 25 Hydroxy (Vit-D Deficiency, Fractures)  . Folate      Francene Finders, FNP-C [place] [phone]

## 2019-10-08 DIAGNOSIS — M1711 Unilateral primary osteoarthritis, right knee: Secondary | ICD-10-CM | POA: Diagnosis not present

## 2019-10-21 ENCOUNTER — Other Ambulatory Visit: Payer: Self-pay

## 2019-10-21 ENCOUNTER — Inpatient Hospital Stay (HOSPITAL_COMMUNITY): Payer: Medicare Other

## 2019-10-21 VITALS — BP 144/74 | HR 87 | Temp 96.9°F | Resp 18

## 2019-10-21 DIAGNOSIS — K5090 Crohn's disease, unspecified, without complications: Secondary | ICD-10-CM | POA: Diagnosis not present

## 2019-10-21 DIAGNOSIS — M1990 Unspecified osteoarthritis, unspecified site: Secondary | ICD-10-CM | POA: Diagnosis not present

## 2019-10-21 DIAGNOSIS — D509 Iron deficiency anemia, unspecified: Secondary | ICD-10-CM | POA: Diagnosis not present

## 2019-10-21 DIAGNOSIS — E041 Nontoxic single thyroid nodule: Secondary | ICD-10-CM | POA: Diagnosis not present

## 2019-10-21 DIAGNOSIS — E559 Vitamin D deficiency, unspecified: Secondary | ICD-10-CM | POA: Diagnosis not present

## 2019-10-21 DIAGNOSIS — D500 Iron deficiency anemia secondary to blood loss (chronic): Secondary | ICD-10-CM

## 2019-10-21 DIAGNOSIS — E538 Deficiency of other specified B group vitamins: Secondary | ICD-10-CM | POA: Diagnosis not present

## 2019-10-21 MED ORDER — SODIUM CHLORIDE 0.9 % IV SOLN
510.0000 mg | Freq: Once | INTRAVENOUS | Status: AC
  Administered 2019-10-21: 510 mg via INTRAVENOUS
  Filled 2019-10-21: qty 510

## 2019-10-21 MED ORDER — SODIUM CHLORIDE 0.9 % IV SOLN: INTRAVENOUS | Status: DC

## 2019-10-21 NOTE — Progress Notes (Signed)
Iron given per orders. Patient tolerated it well without problems. Vitals stable and discharged home from clinic ambulatory. Follow up as scheduled.

## 2019-11-03 ENCOUNTER — Ambulatory Visit (HOSPITAL_COMMUNITY): Payer: Medicare Other

## 2019-11-04 ENCOUNTER — Other Ambulatory Visit: Payer: Self-pay

## 2019-11-04 ENCOUNTER — Inpatient Hospital Stay (HOSPITAL_COMMUNITY): Payer: Medicare Other | Attending: Hematology

## 2019-11-04 ENCOUNTER — Encounter (HOSPITAL_COMMUNITY): Payer: Self-pay

## 2019-11-04 VITALS — BP 139/64 | HR 90 | Temp 97.6°F | Resp 18

## 2019-11-04 DIAGNOSIS — D509 Iron deficiency anemia, unspecified: Secondary | ICD-10-CM | POA: Insufficient documentation

## 2019-11-04 DIAGNOSIS — D500 Iron deficiency anemia secondary to blood loss (chronic): Secondary | ICD-10-CM

## 2019-11-04 DIAGNOSIS — Z79899 Other long term (current) drug therapy: Secondary | ICD-10-CM | POA: Insufficient documentation

## 2019-11-04 MED ORDER — SODIUM CHLORIDE 0.9 % IV SOLN
510.0000 mg | Freq: Once | INTRAVENOUS | Status: AC
  Administered 2019-11-04: 510 mg via INTRAVENOUS
  Filled 2019-11-04: qty 510

## 2019-11-04 MED ORDER — SODIUM CHLORIDE 0.9 % IV SOLN: INTRAVENOUS | Status: DC

## 2019-11-04 NOTE — Progress Notes (Signed)
Iron given per orders. Patient tolerated it well without problems. Vitals stable and discharged home from clinic ambulatory. Follow up as scheduled.

## 2019-12-24 DIAGNOSIS — Z23 Encounter for immunization: Secondary | ICD-10-CM | POA: Diagnosis not present

## 2019-12-29 ENCOUNTER — Other Ambulatory Visit (HOSPITAL_COMMUNITY): Payer: Medicare Other

## 2019-12-30 ENCOUNTER — Other Ambulatory Visit: Payer: Self-pay

## 2019-12-30 ENCOUNTER — Inpatient Hospital Stay (HOSPITAL_COMMUNITY): Payer: Medicare Other | Attending: Nurse Practitioner

## 2019-12-30 DIAGNOSIS — R5383 Other fatigue: Secondary | ICD-10-CM | POA: Diagnosis not present

## 2019-12-30 DIAGNOSIS — D509 Iron deficiency anemia, unspecified: Secondary | ICD-10-CM | POA: Diagnosis present

## 2019-12-30 DIAGNOSIS — E538 Deficiency of other specified B group vitamins: Secondary | ICD-10-CM | POA: Diagnosis not present

## 2019-12-30 DIAGNOSIS — D500 Iron deficiency anemia secondary to blood loss (chronic): Secondary | ICD-10-CM

## 2019-12-30 DIAGNOSIS — Z79899 Other long term (current) drug therapy: Secondary | ICD-10-CM | POA: Insufficient documentation

## 2019-12-30 DIAGNOSIS — E041 Nontoxic single thyroid nodule: Secondary | ICD-10-CM | POA: Diagnosis not present

## 2019-12-30 DIAGNOSIS — K5090 Crohn's disease, unspecified, without complications: Secondary | ICD-10-CM | POA: Diagnosis not present

## 2019-12-30 DIAGNOSIS — E559 Vitamin D deficiency, unspecified: Secondary | ICD-10-CM | POA: Insufficient documentation

## 2019-12-30 LAB — COMPREHENSIVE METABOLIC PANEL
ALT: 20 U/L (ref 0–44)
AST: 17 U/L (ref 15–41)
Albumin: 4.1 g/dL (ref 3.5–5.0)
Alkaline Phosphatase: 84 U/L (ref 38–126)
Anion gap: 13 (ref 5–15)
BUN: 20 mg/dL (ref 8–23)
CO2: 21 mmol/L — ABNORMAL LOW (ref 22–32)
Calcium: 9.1 mg/dL (ref 8.9–10.3)
Chloride: 107 mmol/L (ref 98–111)
Creatinine, Ser: 0.64 mg/dL (ref 0.44–1.00)
GFR calc Af Amer: >60 mL/min (ref >60)
GFR calc non Af Amer: >60 mL/min (ref >60)
Glucose, Bld: 130 mg/dL — ABNORMAL HIGH (ref 70–99)
Potassium: 3.8 mmol/L (ref 3.5–5.1)
Sodium: 141 mmol/L (ref 135–145)
Total Bilirubin: 0.5 mg/dL (ref 0.3–1.2)
Total Protein: 7.2 g/dL (ref 6.5–8.1)

## 2019-12-30 LAB — CBC WITH DIFFERENTIAL/PLATELET
Abs Immature Granulocytes: 0.01 10*3/uL (ref 0.00–0.07)
Basophils Absolute: 0.1 10*3/uL (ref 0.0–0.1)
Basophils Relative: 1 %
Eosinophils Absolute: 0.1 10*3/uL (ref 0.0–0.5)
Eosinophils Relative: 1 %
HCT: 38.3 % (ref 36.0–46.0)
Hemoglobin: 12.3 g/dL (ref 12.0–15.0)
Immature Granulocytes: 0 %
Lymphocytes Relative: 21 %
Lymphs Abs: 1.6 10*3/uL (ref 0.7–4.0)
MCH: 29.2 pg (ref 26.0–34.0)
MCHC: 32.1 g/dL (ref 30.0–36.0)
MCV: 91 fL (ref 80.0–100.0)
Monocytes Absolute: 0.5 10*3/uL (ref 0.1–1.0)
Monocytes Relative: 6 %
Neutro Abs: 5.5 10*3/uL (ref 1.7–7.7)
Neutrophils Relative %: 71 %
Platelets: 251 10*3/uL (ref 150–400)
RBC: 4.21 MIL/uL (ref 3.87–5.11)
RDW: 14 % (ref 11.5–15.5)
WBC: 7.6 10*3/uL (ref 4.0–10.5)
nRBC: 0 % (ref 0.0–0.2)

## 2019-12-30 LAB — IRON AND TIBC
Iron: 36 ug/dL (ref 28–170)
Saturation Ratios: 12 % (ref 10.4–31.8)
TIBC: 305 ug/dL (ref 250–450)
UIBC: 269 ug/dL

## 2019-12-30 LAB — FERRITIN: Ferritin: 56 ng/mL (ref 11–307)

## 2019-12-30 LAB — LACTATE DEHYDROGENASE: LDH: 141 U/L (ref 98–192)

## 2019-12-30 LAB — VITAMIN B12: Vitamin B-12: 362 pg/mL (ref 180–914)

## 2019-12-30 LAB — FOLATE: Folate: 12.3 ng/mL (ref >5.9)

## 2019-12-30 LAB — VITAMIN D 25 HYDROXY (VIT D DEFICIENCY, FRACTURES): Vit D, 25-Hydroxy: 62.68 ng/mL (ref 30–100)

## 2020-01-05 ENCOUNTER — Inpatient Hospital Stay (HOSPITAL_BASED_OUTPATIENT_CLINIC_OR_DEPARTMENT_OTHER): Payer: Medicare Other | Admitting: Nurse Practitioner

## 2020-01-05 DIAGNOSIS — D500 Iron deficiency anemia secondary to blood loss (chronic): Secondary | ICD-10-CM

## 2020-01-05 DIAGNOSIS — D509 Iron deficiency anemia, unspecified: Secondary | ICD-10-CM | POA: Diagnosis not present

## 2020-01-05 NOTE — Progress Notes (Signed)
[name], [place] [id number]   CLINIC:  Medical Oncology/Hematology  PCP:  Kathyrn Drown, MD [address] [place] [address] [phone]   REASON FOR VISIT: Follow-up for iron deficiency anemia   CURRENT THERAPY: Intermittent iron infusions.   INTERVAL HISTORY:  [name] 69 y.o. female returns for routine follow-up for iron deficiency anemia.  Patient reports she is doing well since her last visit.  She does report her fatigue is still super high throughout the day.  She struggles to get through her workday.  She denies any bright red bleeding per rectum or melena.  She denies any easy bruising or bleeding. Denies any nausea, vomiting, or diarrhea. Denies any new pains. Had not noticed any recent bleeding such as epistaxis, hematuria or hematochezia. Denies recent chest pain on exertion, shortness of breath on minimal exertion, pre-syncopal episodes, or palpitations. Denies any numbness or tingling in hands or feet. Denies any recent fevers, infections, or recent hospitalizations. Patient reports appetite at 100% and energy level at 50%.  She is eating well maintain her weight at this time.     REVIEW OF SYSTEMS:  Review of Systems  Constitutional: Positive for fatigue.  Cardiovascular: Positive for leg swelling.  All other systems reviewed and are negative.    PAST MEDICAL/SURGICAL HISTORY:  Past Medical History:  Diagnosis Date  . Arthritis   . B12 deficiency 07/09/2015  . Crohn disease ([place]) JULY 2011 FEDA   PERSISTENT TI ULCERS seen on CE, NO NSAID USE  . Endometriosis    HISTORY  LEADING TO HYSTERECTOMY  . Fibromyalgia   . Fibromyalgia 12/09/2013  . Gastric ulcer   . GERD (gastroesophageal reflux disease)   . Headache   . Helicobacter pylori gastritis    HISTORY  . History of hiatal hernia   . Hives   . Iron (Fe) deficiency anemia 2009   JAN 2012 NL HB & FERRITIN  . Obesity   . Osteoporosis 11/20/2014  .  Plantar fasciitis    Past Surgical History:  Procedure Laterality Date  . ABDOMINAL HYSTERECTOMY    . CATARACT EXTRACTION W/PHACO Right 12/05/2015   Procedure: CATARACT EXTRACTION PHACO AND INTRAOCULAR LENS PLACEMENT (IOC);  Surgeon: Birder Robson, MD;  Location: [facility] ORS;  Service: Ophthalmology;  Laterality: Right;  [place] 00:59.9 AP% 21.9 CDE 13.10 fluid pack lot # [id number]  . CATARACT EXTRACTION W/PHACO Left 06/24/2016   Procedure: CATARACT EXTRACTION PHACO AND INTRAOCULAR LENS PLACEMENT LEFT EYE; CDE:  8.33;  Surgeon: Tonny Branch, MD;  Location: [facility];  Service: Ophthalmology;  Laterality: Left;  . COLONOSCOPY    . ESOPHAGOGASTRODUODENOSCOPY  10/14/2007   Esophagus showed 2 cm pink tongue seen extending from the GE junction.  Biopsies obtained via cold forceps.  Otherwise the  esophagus was without erosions, mass, ulceration, or stricture / . Large hiatal hernia pouch with linear erosions, and two 3-6 mmulcers seen in the pouch.  Biopsies obtained via cold forceps to  evaluate for H. pylori gastritis or malignancy  . Ileocolonoscopy  10/14/2007   Multiple 1-3 mm terminal ileum ulcers biopsied  The ulcers seemed to only involve the last 5 cm of her terminal ileum, and 10 cm of her ileum was visualized.  Otherwise the colon  was without polyps, masses, diverticula, or AVMs.  She had a normal retroflexed view of the rectum  . OVARIAN CYST REMOVAL    . TOTAL ABDOMINAL HYSTERECTOMY W/ BILATERAL SALPINGOOPHORECTOMY    .  UPPER GASTROINTESTINAL ENDOSCOPY  JULY 2011 DIARRHEA/FEDA    Bx-H.PYLORI GASTRITIS, NL DUODENUM     SOCIAL HISTORY:  Social History   Socioeconomic History  . Marital status: Married    Spouse name: Alvester Chou   . Number of children: 3  . Years of education: 68  . Highest education level: Not on file  Occupational History  . Occupation: Retired  Tobacco Use  . Smoking status: Never Smoker  . Smokeless tobacco: Never Used  Substance and Sexual Activity  . Alcohol use: No    . Drug use: No  . Sexual activity: Yes    Birth control/protection: Surgical  Other Topics Concern  . Not on file  Social History Narrative   Lives w/ husband   Caffeine use: Drinks 1-2 cups coffee per day   Social Determinants of Health   Financial Resource Strain:   . Difficulty of Paying Living Expenses:   Food Insecurity:   . Worried About Charity fundraiser in the Last Year:   . Arboriculturist in the Last Year:   Transportation Needs:   . Film/video editor (Medical):   Marland Kitchen Lack of Transportation (Non-Medical):   Physical Activity:   . Days of Exercise per Week:   . Minutes of Exercise per Session:   Stress:   . Feeling of Stress :   Social Connections:   . Frequency of Communication with Friends and Family:   . Frequency of Social Gatherings with Friends and Family:   . Attends Religious Services:   . Active Member of Clubs or Organizations:   . Attends Archivist Meetings:   Marland Kitchen Marital Status:   Intimate Partner Violence:   . Fear of Current or Ex-Partner:   . Emotionally Abused:   Marland Kitchen Physically Abused:   . Sexually Abused:     FAMILY HISTORY:  Family History  Problem Relation Age of Onset  . Diabetes Mother   . Cancer Father   . Diabetes Father   . Diabetes Sister   . Diabetes Brother   . Colon polyps Neg Hx   . Colon cancer Neg Hx   . Stroke Neg Hx     CURRENT MEDICATIONS:  Outpatient Encounter Medications as of 01/05/2020  Medication Sig  . ergocalciferol (VITAMIN D2) 1.25 MG (50000 UT) capsule Take 1 capsule (50,000 Units total) by mouth once a week.  . vitamin B-12 (CYANOCOBALAMIN) 500 MCG tablet Take 500 mcg by mouth daily.  Marland Kitchen acetaminophen (TYLENOL) 325 MG tablet Take 650 mg by mouth as needed for moderate pain or headache.   . traMADol (ULTRAM) 50 MG tablet Take 50 mg by mouth every 6 (six) hours as needed.    Facility-Administered Encounter Medications as of 01/05/2020  Medication  . 0.9 %  sodium chloride infusion     ALLERGIES:  Allergies  Allergen Reactions  . Cefzil [Cefprozil] Rash  . Celebrex [Celecoxib] Hives and Itching  . Sulfonamide Derivatives Itching     PHYSICAL EXAM:  ECOG Performance status: 1  Vitals:   01/05/20 1419  BP: (!) 126/58  Pulse: 95  Resp: 18  Temp: 98.3 F (36.8 C)  SpO2: 96%   Filed Weights   01/05/20 1419  Weight: 220 lb 12.8 oz (100.2 kg)   Physical Exam Constitutional:      Appearance: Normal appearance. She is normal weight.  Cardiovascular:     Rate and Rhythm: Normal rate and regular rhythm.     Heart sounds: Normal heart sounds.  Pulmonary:     Effort: Pulmonary effort is normal.     Breath sounds: Normal breath sounds.  Abdominal:     General: Bowel sounds are normal.     Palpations: Abdomen is soft.  Musculoskeletal:        General: Normal range of motion.  Skin:    General: Skin is warm.  Neurological:     Mental Status: She is alert and oriented to person, place, and time. Mental status is at baseline.  Psychiatric:        Mood and Affect: Mood normal.        Behavior: Behavior normal.        Thought Content: Thought content normal.        Judgment: Judgment normal.      LABORATORY DATA:  I have reviewed the labs as listed.  CBC    Component Value Date/Time   WBC 7.6 12/30/2019 1153   RBC 4.21 12/30/2019 1153   HGB 12.3 12/30/2019 1153   HCT 38.3 12/30/2019 1153   PLT 251 12/30/2019 1153   MCV 91.0 12/30/2019 1153   MCH 29.2 12/30/2019 1153   MCHC 32.1 12/30/2019 1153   RDW 14.0 12/30/2019 1153   LYMPHSABS 1.6 12/30/2019 1153   MONOABS 0.5 12/30/2019 1153   EOSABS 0.1 12/30/2019 1153   BASOSABS 0.1 12/30/2019 1153   CMP Latest Ref Rng & Units 12/30/2019 09/30/2019 06/16/2019  Glucose 70 - 99 mg/dL 130(H) 146(H) 97  BUN 8 - 23 mg/dL 20 18 22   Creatinine 0.44 - 1.00 mg/dL 0.64 0.63 0.64  Sodium 135 - 145 mmol/L 141 140 141  Potassium 3.5 - 5.1 mmol/L 3.8 3.7 3.9  Chloride 98 - 111 mmol/L 107 108 107  CO2 22 - 32  mmol/L 21(L) 23 23  Calcium 8.9 - 10.3 mg/dL 9.1 9.0 8.9  Total Protein 6.5 - 8.1 g/dL 7.2 7.1 7.4  Total Bilirubin 0.3 - 1.2 mg/dL 0.5 0.5 0.5  Alkaline Phos 38 - 126 U/L 84 91 98  AST 15 - 41 U/L 17 18 16   ALT 0 - 44 U/L 20 20 17     All questions were answered to patient's stated satisfaction. Encouraged patient to call with any new concerns or questions before his next visit to the cancer center and we can certain see him sooner, if needed.     ASSESSMENT & PLAN:  Iron deficiency anemia 1.  Iron deficiency anemia: - She has felt to have anemia due to chronic blood loss and malabsorption.   - She requires intermittent IV iron infusions.  Her last infusions were 04/02/2019 04/09/2019. - Labs done on 12/30/2019 showed her hemoglobin 12.3, ferritin 56, percent saturation 12, platelets 251, WBC 7.6, creatinine 0.64 -Due to her extreme fatigue we will give her iron infusions. - We will set her up with 3 more infusions of Feraheme. - She will follow-up in 4 months with repeat labs.  2.  B12 deficiency: - She is on B12 injections monthly from her PCP - Labs done on 12/30/2019 showed vitamin B12 level at 362 -We recommend she continue on the B12 injection replacement at this time. -We will also place her on oral B12 sublingual thousand milligrams daily - We will repeat labs when she follows up in 4 months.  3.  Vitamin D deficiency: -Labs done on 12/30/2019 showed her vitamin D level at 62.68 -She will continue taking the 50,000 units of vitamin D weekly. -We will recheck her levels in 4 months.  4.  Crohn's disease: - She is closely followed by GI due to her recurrent IDA. - She missed her recent follow-up with her GI doctor. -She is due for another colonoscopy she wishes to wait a couple months and she will ask for a referral.  5.  Thyroid nodule: - On 08/2017 a thyroid nodule was found by ultrasound. - A biopsy was done 09/2017 that showed benign nodule. - Patient should follow-up with  PCP or endocrinology as directed.  6.  Health maintenance: - Last colonoscopy was done 02/22/2010.  She is recommended to follow-up with GI. - Her last mammogram was in 2006.  It is recommended to schedule a mammogram.     Orders placed this encounter:  Orders Placed This Encounter  Procedures  . Lactate dehydrogenase  . CBC with Differential/Platelet  . Comprehensive metabolic panel  . Ferritin  . Iron and TIBC  . Vitamin B12  . VITAMIN D 25 Hydroxy (Vit-D Deficiency, Fractures)  . Folate      Francene Finders, FNP-C [place] [phone]

## 2020-01-05 NOTE — Assessment & Plan Note (Signed)
1.  Iron deficiency anemia: - She has felt to have anemia due to chronic blood loss and malabsorption.   - She requires intermittent IV iron infusions.  Her last infusions were 04/02/2019 04/09/2019. - Labs done on 12/30/2019 showed her hemoglobin 12.3, ferritin 56, percent saturation 12, platelets 251, WBC 7.6, creatinine 0.64 -Due to her extreme fatigue we will give her iron infusions. - We will set her up with 3 more infusions of Feraheme. - She will follow-up in 4 months with repeat labs.  2.  B12 deficiency: - She is on B12 injections monthly from her PCP - Labs done on 12/30/2019 showed vitamin B12 level at 362 -We recommend she continue on the B12 injection replacement at this time. -We will also place her on oral B12 sublingual thousand milligrams daily - We will repeat labs when she follows up in 4 months.  3.  Vitamin D deficiency: -Labs done on 12/30/2019 showed her vitamin D level at 62.68 -She will continue taking the 50,000 units of vitamin D weekly. -We will recheck her levels in 4 months.  4.  Crohn's disease: - She is closely followed by GI due to her recurrent IDA. - She missed her recent follow-up with her GI doctor. -She is due for another colonoscopy she wishes to wait a couple months and she will ask for a referral.  5.  Thyroid nodule: - On 08/2017 a thyroid nodule was found by ultrasound. - A biopsy was done 09/2017 that showed benign nodule. - Patient should follow-up with PCP or endocrinology as directed.  6.  Health maintenance: - Last colonoscopy was done 02/22/2010.  She is recommended to follow-up with GI. - Her last mammogram was in 2006.  It is recommended to schedule a mammogram.

## 2020-01-07 ENCOUNTER — Other Ambulatory Visit: Payer: Self-pay

## 2020-01-07 ENCOUNTER — Encounter (HOSPITAL_COMMUNITY): Payer: Self-pay

## 2020-01-07 ENCOUNTER — Inpatient Hospital Stay (HOSPITAL_COMMUNITY): Payer: Medicare Other

## 2020-01-07 VITALS — BP 121/52 | HR 70 | Temp 97.7°F | Resp 18

## 2020-01-07 DIAGNOSIS — D509 Iron deficiency anemia, unspecified: Secondary | ICD-10-CM | POA: Diagnosis not present

## 2020-01-07 DIAGNOSIS — D500 Iron deficiency anemia secondary to blood loss (chronic): Secondary | ICD-10-CM

## 2020-01-07 MED ORDER — SODIUM CHLORIDE 0.9 % IV SOLN
510.0000 mg | Freq: Once | INTRAVENOUS | Status: AC
  Administered 2020-01-07: 510 mg via INTRAVENOUS
  Filled 2020-01-07: qty 510

## 2020-01-07 MED ORDER — SODIUM CHLORIDE 0.9 % IV SOLN: INTRAVENOUS | Status: DC

## 2020-01-07 NOTE — Patient Instructions (Addendum)
[place] at [facility]  Discharge Instructions:   _______________________________________________________________  Thank you for choosing [place] at [facility] to provide your oncology and hematology care.  To afford each patient quality time with our providers, please arrive at least 15 minutes before your scheduled appointment.  You need to re-schedule your appointment if you arrive 10 or more minutes late.  We strive to give you quality time with our providers, and arriving late affects you and other patients whose appointments are after yours.  Also, if you no show three or more times for appointments you may be dismissed from the clinic.  Again, thank you for choosing [place] at [place] hope is that these requests will allow you access to exceptional care and in a timely manner. _______________________________________________________________  If you have questions after your visit, please contact our office at [phone] between the hours of 8:30 a.m. and 5:00 p.m. Voicemails left after 4:30 p.m. will not be returned until the following business day. _______________________________________________________________  For prescription refill requests, have your pharmacy contact our office. _______________________________________________________________  Recommendations made by the consultant and any test results will be sent to your referring physician. _______________________________________________________________[facility] [place] at [facility]  Discharge Instructions:  _______________________________________________________________  Thank you for choosing [place] at [facility] to provide your oncology and hematology care.  To afford each patient quality time with our providers, please arrive at least 15 minutes before your scheduled appointment.  You need  to re-schedule your appointment if you arrive 10 or more minutes late.  We strive to give you quality time with our providers, and arriving late affects you and other patients whose appointments are after yours.  Also, if you no show three or more times for appointments you may be dismissed from the clinic.  Again, thank you for choosing [place] at [place] hope is that these requests will allow you access to exceptional care and in a timely manner. _______________________________________________________________  If you have questions after your visit, please contact our office at [phone] between the hours of 8:30 a.m. and 5:00 p.m. Voicemails left after 4:30 p.m. will not be returned until the following business day. _______________________________________________________________  For prescription refill requests, have your pharmacy contact our office. _______________________________________________________________  Recommendations made by the consultant and any test results will be sent to your referring physician. _______________________________________________________________

## 2020-01-07 NOTE — Progress Notes (Signed)
Patient presents today for Feraheme infusion. Vital signs are stable. Patient has complaints of chronic fatigue. Patient denies any pain today. MAR reviewed and updated.   Feraheme given today per MD orders. Tolerated infusion without adverse affects. Vital signs stable. No complaints at this time. Discharged from clinic ambulatory. F/U with [facility] as scheduled.

## 2020-01-17 ENCOUNTER — Inpatient Hospital Stay (HOSPITAL_COMMUNITY): Payer: Medicare Other

## 2020-01-17 ENCOUNTER — Encounter (HOSPITAL_COMMUNITY): Payer: Self-pay

## 2020-01-17 ENCOUNTER — Other Ambulatory Visit: Payer: Self-pay

## 2020-01-17 VITALS — BP 137/56 | HR 72 | Temp 97.8°F | Resp 18

## 2020-01-17 DIAGNOSIS — D500 Iron deficiency anemia secondary to blood loss (chronic): Secondary | ICD-10-CM

## 2020-01-17 DIAGNOSIS — D509 Iron deficiency anemia, unspecified: Secondary | ICD-10-CM | POA: Diagnosis not present

## 2020-01-17 MED ORDER — SODIUM CHLORIDE 0.9 % IV SOLN
510.0000 mg | Freq: Once | INTRAVENOUS | Status: AC
  Administered 2020-01-17: 510 mg via INTRAVENOUS
  Filled 2020-01-17: qty 510

## 2020-01-17 MED ORDER — SODIUM CHLORIDE 0.9 % IV SOLN: INTRAVENOUS | Status: DC

## 2020-01-17 NOTE — Patient Instructions (Signed)
[facility] at [facility]  Discharge Instructions:   _______________________________________________________________  Thank you for choosing [facility] at [facility] to provide your oncology and hematology care.  To afford each patient quality time with our providers, please arrive at least 15 minutes before your scheduled appointment.  You need to re-schedule your appointment if you arrive 10 or more minutes late.  We strive to give you quality time with our providers, and arriving late affects you and other patients whose appointments are after yours.  Also, if you no show three or more times for appointments you may be dismissed from the clinic.  Again, thank you for choosing [facility]. Our hope is that these requests will allow you access to exceptional care and in a timely manner. _______________________________________________________________  If you have questions after your visit, please contact our office at [phone] between the hours of 8:30 a.m. and 5:00 p.m. Voicemails left after 4:30 p.m. will not be returned until the following business day. _______________________________________________________________  For prescription refill requests, have your pharmacy contact our office. _______________________________________________________________  Recommendations made by the consultant and any test results will be sent to your referring physician. _______________________________________________________________ 

## 2020-01-17 NOTE — Progress Notes (Signed)
Patient presents today for feraheme infusion. MAR reviewed and updated. Vital signs stable. Patient has no complaints of any changes since her last visit.   Feraheme given today per MD orders. Tolerated infusion without adverse affects. Vital signs stable. No complaints at this time. Discharged from clinic ambulatory. F/U with [facility] as scheduled.

## 2020-01-21 DIAGNOSIS — Z23 Encounter for immunization: Secondary | ICD-10-CM | POA: Diagnosis not present

## 2020-01-24 ENCOUNTER — Inpatient Hospital Stay (HOSPITAL_COMMUNITY): Payer: Medicare Other

## 2020-01-24 ENCOUNTER — Other Ambulatory Visit: Payer: Self-pay

## 2020-01-24 ENCOUNTER — Encounter (HOSPITAL_COMMUNITY): Payer: Self-pay

## 2020-01-24 VITALS — BP 131/55 | HR 80 | Temp 97.5°F | Resp 18

## 2020-01-24 DIAGNOSIS — D500 Iron deficiency anemia secondary to blood loss (chronic): Secondary | ICD-10-CM

## 2020-01-24 DIAGNOSIS — D509 Iron deficiency anemia, unspecified: Secondary | ICD-10-CM | POA: Diagnosis not present

## 2020-01-24 MED ORDER — SODIUM CHLORIDE 0.9 % IV SOLN: INTRAVENOUS | Status: DC

## 2020-01-24 MED ORDER — SODIUM CHLORIDE 0.9 % IV SOLN
510.0000 mg | Freq: Once | INTRAVENOUS | Status: AC
  Administered 2020-01-24: 510 mg via INTRAVENOUS
  Filled 2020-01-24: qty 510

## 2020-01-24 NOTE — Progress Notes (Signed)
[name] tolerated Feraheme infusion well without complaints or incident. Peripheral IV site checked with positive blood return noted prior to and after infusion. VSS upon discharge. Pt discharged self ambulatory in satisfactory condition

## 2020-01-24 NOTE — Patient Instructions (Signed)
[place] at [facility] Discharge Instructions  Received Feraheme infusion today. Follow-up as scheduled   Thank you for choosing [place] at [facility] to provide your oncology and hematology care.  To afford each patient quality time with our provider, please arrive at least 15 minutes before your scheduled appointment time.   If you have a lab appointment with the [facility] please come in thru the Main Entrance and check in at the main information desk.  You need to re-schedule your appointment should you arrive 10 or more minutes late.  We strive to give you quality time with our providers, and arriving late affects you and other patients whose appointments are after yours.  Also, if you no show three or more times for appointments you may be dismissed from the clinic at the providers discretion.     Again, thank you for choosing [facility]  Our hope is that these requests will decrease the amount of time that you wait before being seen by our physicians.       _____________________________________________________________  Should you have questions after your visit to [facility], please contact our office at [phone] between the hours of 8:00 a.m. and 4:30 p.m.  Voicemails left after 4:00 p.m. will not be returned until the following business day.  For prescription refill requests, have your pharmacy contact our office and allow 72 hours.    Due to Covid, you will need to wear a mask upon entering the hospital. If you do not have a mask, a mask will be given to you at the Main Entrance upon arrival. For doctor visits, patients may have 1 support person with them. For treatment visits, patients can not have anyone with them due to social distancing guidelines and our immunocompromised population.

## 2020-01-25 MED ORDER — FULVESTRANT 250 MG/5ML IM SOLN
INTRAMUSCULAR | Status: AC
  Filled 2020-01-25: qty 5

## 2020-04-13 ENCOUNTER — Other Ambulatory Visit: Payer: Self-pay

## 2020-04-13 ENCOUNTER — Inpatient Hospital Stay (HOSPITAL_COMMUNITY): Payer: Medicare Other | Attending: Nurse Practitioner

## 2020-04-13 DIAGNOSIS — D509 Iron deficiency anemia, unspecified: Secondary | ICD-10-CM | POA: Insufficient documentation

## 2020-04-13 DIAGNOSIS — E538 Deficiency of other specified B group vitamins: Secondary | ICD-10-CM | POA: Insufficient documentation

## 2020-04-13 DIAGNOSIS — E559 Vitamin D deficiency, unspecified: Secondary | ICD-10-CM | POA: Insufficient documentation

## 2020-04-13 DIAGNOSIS — D500 Iron deficiency anemia secondary to blood loss (chronic): Secondary | ICD-10-CM

## 2020-04-13 DIAGNOSIS — Z79899 Other long term (current) drug therapy: Secondary | ICD-10-CM | POA: Diagnosis not present

## 2020-04-13 DIAGNOSIS — K5090 Crohn's disease, unspecified, without complications: Secondary | ICD-10-CM | POA: Diagnosis not present

## 2020-04-13 DIAGNOSIS — E041 Nontoxic single thyroid nodule: Secondary | ICD-10-CM | POA: Diagnosis not present

## 2020-04-13 LAB — COMPREHENSIVE METABOLIC PANEL
ALT: 21 U/L (ref 0–44)
AST: 16 U/L (ref 15–41)
Albumin: 3.9 g/dL (ref 3.5–5.0)
Alkaline Phosphatase: 77 U/L (ref 38–126)
Anion gap: 11 (ref 5–15)
BUN: 19 mg/dL (ref 8–23)
CO2: 22 mmol/L (ref 22–32)
Calcium: 9.1 mg/dL (ref 8.9–10.3)
Chloride: 104 mmol/L (ref 98–111)
Creatinine, Ser: 0.72 mg/dL (ref 0.44–1.00)
GFR calc Af Amer: >60 mL/min (ref >60)
GFR calc non Af Amer: >60 mL/min (ref >60)
Glucose, Bld: 107 mg/dL — ABNORMAL HIGH (ref 70–99)
Potassium: 3.8 mmol/L (ref 3.5–5.1)
Sodium: 137 mmol/L (ref 135–145)
Total Bilirubin: 0.6 mg/dL (ref 0.3–1.2)
Total Protein: 7 g/dL (ref 6.5–8.1)

## 2020-04-13 LAB — FERRITIN: Ferritin: 104 ng/mL (ref 11–307)

## 2020-04-13 LAB — IRON AND TIBC
Iron: 52 ug/dL (ref 28–170)
Saturation Ratios: 19 % (ref 10.4–31.8)
TIBC: 277 ug/dL (ref 250–450)
UIBC: 225 ug/dL

## 2020-04-13 LAB — CBC WITH DIFFERENTIAL/PLATELET
Abs Immature Granulocytes: 0.03 10*3/uL (ref 0.00–0.07)
Basophils Absolute: 0.1 10*3/uL (ref 0.0–0.1)
Basophils Relative: 1 %
Eosinophils Absolute: 0.1 10*3/uL (ref 0.0–0.5)
Eosinophils Relative: 1 %
HCT: 40.5 % (ref 36.0–46.0)
Hemoglobin: 13 g/dL (ref 12.0–15.0)
Immature Granulocytes: 0 %
Lymphocytes Relative: 19 %
Lymphs Abs: 1.6 10*3/uL (ref 0.7–4.0)
MCH: 29.3 pg (ref 26.0–34.0)
MCHC: 32.1 g/dL (ref 30.0–36.0)
MCV: 91.4 fL (ref 80.0–100.0)
Monocytes Absolute: 0.6 10*3/uL (ref 0.1–1.0)
Monocytes Relative: 7 %
Neutro Abs: 5.9 10*3/uL (ref 1.7–7.7)
Neutrophils Relative %: 72 %
Platelets: 248 10*3/uL (ref 150–400)
RBC: 4.43 MIL/uL (ref 3.87–5.11)
RDW: 12.7 % (ref 11.5–15.5)
WBC: 8.2 10*3/uL (ref 4.0–10.5)
nRBC: 0 % (ref 0.0–0.2)

## 2020-04-13 LAB — VITAMIN D 25 HYDROXY (VIT D DEFICIENCY, FRACTURES): Vit D, 25-Hydroxy: 50.46 ng/mL (ref 30–100)

## 2020-04-13 LAB — VITAMIN B12: Vitamin B-12: 436 pg/mL (ref 180–914)

## 2020-04-13 LAB — FOLATE: Folate: 13.1 ng/mL (ref >5.9)

## 2020-04-13 LAB — LACTATE DEHYDROGENASE: LDH: 131 U/L (ref 98–192)

## 2020-04-19 ENCOUNTER — Other Ambulatory Visit: Payer: Self-pay

## 2020-04-19 ENCOUNTER — Inpatient Hospital Stay (HOSPITAL_BASED_OUTPATIENT_CLINIC_OR_DEPARTMENT_OTHER): Payer: Medicare Other | Admitting: Nurse Practitioner

## 2020-04-19 ENCOUNTER — Ambulatory Visit (HOSPITAL_COMMUNITY): Payer: Medicare Other | Admitting: Nurse Practitioner

## 2020-04-19 DIAGNOSIS — Z79899 Other long term (current) drug therapy: Secondary | ICD-10-CM | POA: Diagnosis not present

## 2020-04-19 DIAGNOSIS — E041 Nontoxic single thyroid nodule: Secondary | ICD-10-CM | POA: Diagnosis not present

## 2020-04-19 DIAGNOSIS — D509 Iron deficiency anemia, unspecified: Secondary | ICD-10-CM | POA: Diagnosis not present

## 2020-04-19 DIAGNOSIS — D500 Iron deficiency anemia secondary to blood loss (chronic): Secondary | ICD-10-CM

## 2020-04-19 DIAGNOSIS — E538 Deficiency of other specified B group vitamins: Secondary | ICD-10-CM | POA: Diagnosis not present

## 2020-04-19 DIAGNOSIS — E559 Vitamin D deficiency, unspecified: Secondary | ICD-10-CM | POA: Diagnosis not present

## 2020-04-19 DIAGNOSIS — K5090 Crohn's disease, unspecified, without complications: Secondary | ICD-10-CM | POA: Diagnosis not present

## 2020-04-19 NOTE — Assessment & Plan Note (Signed)
1.  Iron deficiency anemia: - She has felt to have anemia due to chronic blood loss and malabsorption.   - She requires intermittent IV iron infusions.  Her last infusions were 04/02/2019 04/09/2019. - Labs done on 04/13/2020 showed hemoglobin 13.0 ferritin 104, percent saturation 19 -Due to her extreme fatigue we will give her iron infusions. - We will set her up with 2 more infusions of Feraheme. -Patient does best with every 3 months infusions due to her extreme fatigue if she gets below 100 on her ferritin. - She will follow-up in 3 months with repeat labs.  2.  B12 deficiency: - She is on B12 injections monthly from her PCP - Labs done on 04/13/2020 showed vitamin B12 level at 436 -We recommend she continue on the B12 injection replacement at this time. -We will also place her on oral B12 sublingual thousand milligrams daily - We will repeat labs when she follows up in 3 months.  3.  Vitamin D deficiency: -Labs done on 04/13/2020 showed her vitamin D level at 50.46 -She will continue taking the 50,000 units of vitamin D weekly. -We will recheck her levels in 3 months.  4.  Crohn's disease: - She is closely followed by GI due to her recurrent IDA. - She missed her recent follow-up with her GI doctor. -She is due for another colonoscopy she wishes to wait a couple months and she will ask for a referral.  5.  Thyroid nodule: - On 08/2017 a thyroid nodule was found by ultrasound. - A biopsy was done 09/2017 that showed benign nodule. - Patient should follow-up with PCP or endocrinology as directed.  6.  Health maintenance: - Last colonoscopy was done 02/22/2010.  She is recommended to follow-up with GI. - Her last mammogram was in 2006.  It is recommended to schedule a mammogram.

## 2020-04-19 NOTE — Progress Notes (Signed)
[place], [place] [address]   CLINIC:  Medical Oncology/Hematology  PCP:  Kathyrn Drown, MD [address] [place] [address] [phone]   REASON FOR VISIT: Follow-up for iron deficiency anemia   CURRENT THERAPY: Intermittent iron infusions   INTERVAL HISTORY:  [name] 69 y.o. female returns for routine follow-up for iron deficiency anemia.  Patient reports she is doing well since her last visit.  She does report her fatigue levels have gotten a little worse since last time.  She denies any bright red bleeding per rectum or melena.  She denies any easy bruising or bleeding. Denies any nausea, vomiting, or diarrhea. Denies any new pains. Had not noticed any recent bleeding such as epistaxis, hematuria or hematochezia. Denies recent chest pain on exertion, shortness of breath on minimal exertion, pre-syncopal episodes, or palpitations. Denies any numbness or tingling in hands or feet. Denies any recent fevers, infections, or recent hospitalizations. Patient reports appetite at 100% and energy level at 50%.  She is eating well maintain her weight is time.     REVIEW OF SYSTEMS:  Review of Systems  Constitutional: Positive for fatigue.  Neurological: Positive for dizziness and headaches.  Psychiatric/Behavioral: Positive for sleep disturbance.  All other systems reviewed and are negative.    PAST MEDICAL/SURGICAL HISTORY:  Past Medical History:  Diagnosis Date  . Arthritis   . B12 deficiency 07/09/2015  . Crohn disease ([place]) JULY 2011 FEDA   PERSISTENT TI ULCERS seen on CE, NO NSAID USE  . Endometriosis    HISTORY  LEADING TO HYSTERECTOMY  . Fibromyalgia   . Fibromyalgia 12/09/2013  . Gastric ulcer   . GERD (gastroesophageal reflux disease)   . Headache   . Helicobacter pylori gastritis    HISTORY  . History of hiatal hernia   . Hives   . Iron (Fe) deficiency anemia 2009   JAN 2012 NL HB & FERRITIN  . Obesity     . Osteoporosis 11/20/2014  . Plantar fasciitis    Past Surgical History:  Procedure Laterality Date  . ABDOMINAL HYSTERECTOMY    . CATARACT EXTRACTION W/PHACO Right 12/05/2015   Procedure: CATARACT EXTRACTION PHACO AND INTRAOCULAR LENS PLACEMENT (IOC);  Surgeon: Birder Robson, MD;  Location: [facility] ORS;  Service: Ophthalmology;  Laterality: Right;  [place] 00:59.9 AP% 21.9 CDE 13.10 fluid pack lot # [id number]  . CATARACT EXTRACTION W/PHACO Left 06/24/2016   Procedure: CATARACT EXTRACTION PHACO AND INTRAOCULAR LENS PLACEMENT LEFT EYE; CDE:  8.33;  Surgeon: Tonny Branch, MD;  Location: [facility] ORS;  Service: Ophthalmology;  Laterality: Left;  . COLONOSCOPY    . ESOPHAGOGASTRODUODENOSCOPY  10/14/2007   Esophagus showed 2 cm pink tongue seen extending from the GE junction.  Biopsies obtained via cold forceps.  Otherwise the  esophagus was without erosions, mass, ulceration, or stricture / . Large hiatal hernia pouch with linear erosions, and two 3-6 mmulcers seen in the pouch.  Biopsies obtained via cold forceps to  evaluate for H. pylori gastritis or malignancy  . Ileocolonoscopy  10/14/2007   Multiple 1-3 mm terminal ileum ulcers biopsied  The ulcers seemed to only involve the last 5 cm of her terminal ileum, and 10 cm of her ileum was visualized.  Otherwise the colon  was without polyps, masses, diverticula, or AVMs.  She had a normal retroflexed view of the rectum  . OVARIAN CYST REMOVAL    . TOTAL ABDOMINAL HYSTERECTOMY W/ BILATERAL SALPINGOOPHORECTOMY    .  UPPER GASTROINTESTINAL ENDOSCOPY  JULY 2011 DIARRHEA/FEDA    Bx-H.PYLORI GASTRITIS, NL DUODENUM     SOCIAL HISTORY:  Social History   Socioeconomic History  . Marital status: Married    Spouse name: Alvester Chou   . Number of children: 3  . Years of education: 89  . Highest education level: Not on file  Occupational History  . Occupation: Retired  Tobacco Use  . Smoking status: Never Smoker  . Smokeless tobacco: Never Used  Vaping Use  .  Vaping Use: Never used  Substance and Sexual Activity  . Alcohol use: No  . Drug use: No  . Sexual activity: Yes    Birth control/protection: Surgical  Other Topics Concern  . Not on file  Social History Narrative   Lives w/ husband   Caffeine use: Drinks 1-2 cups coffee per day   Social Determinants of Health   Financial Resource Strain:   . Difficulty of Paying Living Expenses: Not on file  Food Insecurity:   . Worried About Charity fundraiser in the Last Year: Not on file  . Ran Out of Food in the Last Year: Not on file  Transportation Needs:   . Lack of Transportation (Medical): Not on file  . Lack of Transportation (Non-Medical): Not on file  Physical Activity:   . Days of Exercise per Week: Not on file  . Minutes of Exercise per Session: Not on file  Stress:   . Feeling of Stress : Not on file  Social Connections:   . Frequency of Communication with Friends and Family: Not on file  . Frequency of Social Gatherings with Friends and Family: Not on file  . Attends Religious Services: Not on file  . Active Member of Clubs or Organizations: Not on file  . Attends Archivist Meetings: Not on file  . Marital Status: Not on file  Intimate Partner Violence:   . Fear of Current or Ex-Partner: Not on file  . Emotionally Abused: Not on file  . Physically Abused: Not on file  . Sexually Abused: Not on file    FAMILY HISTORY:  Family History  Problem Relation Age of Onset  . Diabetes Mother   . Cancer Father   . Diabetes Father   . Diabetes Sister   . Diabetes Brother   . Colon polyps Neg Hx   . Colon cancer Neg Hx   . Stroke Neg Hx     CURRENT MEDICATIONS:  Outpatient Encounter Medications as of 04/19/2020  Medication Sig  . acetaminophen (TYLENOL) 325 MG tablet Take 650 mg by mouth as needed for moderate pain or headache.   . ergocalciferol (VITAMIN D2) 1.25 MG (50000 UT) capsule Take 1 capsule (50,000 Units total) by mouth once a week.  . vitamin B-12  (CYANOCOBALAMIN) 500 MCG tablet Take 500 mcg by mouth daily.  . [DISCONTINUED] traMADol (ULTRAM) 50 MG tablet Take 50 mg by mouth every 6 (six) hours as needed.    Facility-Administered Encounter Medications as of 04/19/2020  Medication  . 0.9 %  sodium chloride infusion    ALLERGIES:  Allergies  Allergen Reactions  . Cefzil [Cefprozil] Rash  . Celebrex [Celecoxib] Hives and Itching  . Sulfonamide Derivatives Itching     PHYSICAL EXAM:  ECOG Performance status: 1  Vitals:   04/19/20 0901  BP: (!) 141/67  Pulse: 71  Resp: 19  Temp: (!) 96.9 F (36.1 C)  SpO2: 97%   Filed Weights   04/19/20 0901  Weight: 219  lb 11.2 oz (99.7 kg)   Physical Exam Constitutional:      Appearance: Normal appearance. She is normal weight.  Cardiovascular:     Rate and Rhythm: Normal rate and regular rhythm.     Heart sounds: Normal heart sounds.  Pulmonary:     Effort: Pulmonary effort is normal.     Breath sounds: Normal breath sounds.  Abdominal:     General: Bowel sounds are normal.     Palpations: Abdomen is soft.  Musculoskeletal:        General: Normal range of motion.  Skin:    General: Skin is warm.  Neurological:     Mental Status: She is alert and oriented to person, place, and time. Mental status is at baseline.  Psychiatric:        Mood and Affect: Mood normal.        Behavior: Behavior normal.        Thought Content: Thought content normal.        Judgment: Judgment normal.      LABORATORY DATA:  I have reviewed the labs as listed.  CBC    Component Value Date/Time   WBC 8.2 04/13/2020 1254   RBC 4.43 04/13/2020 1254   HGB 13.0 04/13/2020 1254   HCT 40.5 04/13/2020 1254   PLT 248 04/13/2020 1254   MCV 91.4 04/13/2020 1254   MCH 29.3 04/13/2020 1254   MCHC 32.1 04/13/2020 1254   RDW 12.7 04/13/2020 1254   LYMPHSABS 1.6 04/13/2020 1254   MONOABS 0.6 04/13/2020 1254   EOSABS 0.1 04/13/2020 1254   BASOSABS 0.1 04/13/2020 1254   CMP Latest Ref Rng & Units  04/13/2020 12/30/2019 09/30/2019  Glucose 70 - 99 mg/dL 107(H) 130(H) 146(H)  BUN 8 - 23 mg/dL 19 20 18   Creatinine 0.44 - 1.00 mg/dL 0.72 0.64 0.63  Sodium 135 - 145 mmol/L 137 141 140  Potassium 3.5 - 5.1 mmol/L 3.8 3.8 3.7  Chloride 98 - 111 mmol/L 104 107 108  CO2 22 - 32 mmol/L 22 21(L) 23  Calcium 8.9 - 10.3 mg/dL 9.1 9.1 9.0  Total Protein 6.5 - 8.1 g/dL 7.0 7.2 7.1  Total Bilirubin 0.3 - 1.2 mg/dL 0.6 0.5 0.5  Alkaline Phos 38 - 126 U/L 77 84 91  AST 15 - 41 U/L 16 17 18   ALT 0 - 44 U/L 21 20 20     All questions were answered to patient's stated satisfaction. Encouraged patient to call with any new concerns or questions before his next visit to the cancer center and we can certain see him sooner, if needed.     ASSESSMENT & PLAN:  Iron deficiency anemia 1.  Iron deficiency anemia: - She has felt to have anemia due to chronic blood loss and malabsorption.   - She requires intermittent IV iron infusions.  Her last infusions were 04/02/2019 04/09/2019. - Labs done on 04/13/2020 showed hemoglobin 13.0 ferritin 104, percent saturation 19 -Due to her extreme fatigue we will give her iron infusions. - We will set her up with 2 more infusions of Feraheme. -Patient does best with every 3 months infusions due to her extreme fatigue if she gets below 100 on her ferritin. - She will follow-up in 3 months with repeat labs.  2.  B12 deficiency: - She is on B12 injections monthly from her PCP - Labs done on 04/13/2020 showed vitamin B12 level at 436 -We recommend she continue on the B12 injection replacement at this time. -We will  also place her on oral B12 sublingual thousand milligrams daily - We will repeat labs when she follows up in 3 months.  3.  Vitamin D deficiency: -Labs done on 04/13/2020 showed her vitamin D level at 50.46 -She will continue taking the 50,000 units of vitamin D weekly. -We will recheck her levels in 3 months.  4.  Crohn's disease: - She is closely followed by GI  due to her recurrent IDA. - She missed her recent follow-up with her GI doctor. -She is due for another colonoscopy she wishes to wait a couple months and she will ask for a referral.  5.  Thyroid nodule: - On 08/2017 a thyroid nodule was found by ultrasound. - A biopsy was done 09/2017 that showed benign nodule. - Patient should follow-up with PCP or endocrinology as directed.  6.  Health maintenance: - Last colonoscopy was done 02/22/2010.  She is recommended to follow-up with GI. - Her last mammogram was in 2006.  It is recommended to schedule a mammogram.     Orders placed this encounter:  Orders Placed This Encounter  Procedures  . CBC with Differential/Platelet  . Comprehensive metabolic panel  . Ferritin  . Iron and TIBC  . Lactate dehydrogenase  . Vitamin B12  . VITAMIN D 25 Hydroxy (Vit-D Deficiency, Fractures)      Francene Finders, FNP-C [place] [phone]

## 2020-04-25 ENCOUNTER — Other Ambulatory Visit: Payer: Self-pay

## 2020-04-25 ENCOUNTER — Encounter (HOSPITAL_COMMUNITY): Payer: Self-pay

## 2020-04-25 ENCOUNTER — Inpatient Hospital Stay (HOSPITAL_COMMUNITY): Payer: Medicare Other

## 2020-04-25 VITALS — BP 145/70 | HR 83 | Temp 97.1°F | Resp 18

## 2020-04-25 DIAGNOSIS — D509 Iron deficiency anemia, unspecified: Secondary | ICD-10-CM | POA: Diagnosis not present

## 2020-04-25 DIAGNOSIS — E538 Deficiency of other specified B group vitamins: Secondary | ICD-10-CM | POA: Diagnosis not present

## 2020-04-25 DIAGNOSIS — D500 Iron deficiency anemia secondary to blood loss (chronic): Secondary | ICD-10-CM

## 2020-04-25 DIAGNOSIS — E041 Nontoxic single thyroid nodule: Secondary | ICD-10-CM | POA: Diagnosis not present

## 2020-04-25 DIAGNOSIS — E559 Vitamin D deficiency, unspecified: Secondary | ICD-10-CM | POA: Diagnosis not present

## 2020-04-25 DIAGNOSIS — Z79899 Other long term (current) drug therapy: Secondary | ICD-10-CM | POA: Diagnosis not present

## 2020-04-25 DIAGNOSIS — K5090 Crohn's disease, unspecified, without complications: Secondary | ICD-10-CM | POA: Diagnosis not present

## 2020-04-25 MED ORDER — SODIUM CHLORIDE 0.9 % IV SOLN
510.0000 mg | Freq: Once | INTRAVENOUS | Status: AC
  Administered 2020-04-25: 510 mg via INTRAVENOUS
  Filled 2020-04-25: qty 510

## 2020-04-25 MED ORDER — SODIUM CHLORIDE 0.9 % IV SOLN: INTRAVENOUS | Status: DC

## 2020-04-25 NOTE — Progress Notes (Signed)
Iron infusion given per orders. Patient tolerated it well without problems. Vitals stable and discharged home from clinic ambulatory in stable condition. Follow up as scheduled.  

## 2020-05-02 ENCOUNTER — Inpatient Hospital Stay (HOSPITAL_COMMUNITY): Payer: Medicare Other | Attending: Nurse Practitioner

## 2020-05-02 ENCOUNTER — Other Ambulatory Visit: Payer: Self-pay

## 2020-05-02 VITALS — BP 115/75 | HR 83 | Temp 97.0°F | Resp 18

## 2020-05-02 DIAGNOSIS — D509 Iron deficiency anemia, unspecified: Secondary | ICD-10-CM | POA: Diagnosis not present

## 2020-05-02 DIAGNOSIS — D500 Iron deficiency anemia secondary to blood loss (chronic): Secondary | ICD-10-CM

## 2020-05-02 MED ORDER — SODIUM CHLORIDE 0.9 % IV SOLN
510.0000 mg | Freq: Once | INTRAVENOUS | Status: AC
  Administered 2020-05-02: 510 mg via INTRAVENOUS
  Filled 2020-05-02: qty 510

## 2020-05-02 MED ORDER — SODIUM CHLORIDE 0.9 % IV SOLN: Freq: Once | INTRAVENOUS | Status: AC

## 2020-05-02 NOTE — Patient Instructions (Signed)
[facility] at [facility]  Discharge Instructions:   _______________________________________________________________  Thank you for choosing [facility] at [facility] to provide your oncology and hematology care.  To afford each patient quality time with our providers, please arrive at least 15 minutes before your scheduled appointment.  You need to re-schedule your appointment if you arrive 10 or more minutes late.  We strive to give you quality time with our providers, and arriving late affects you and other patients whose appointments are after yours.  Also, if you no show three or more times for appointments you may be dismissed from the clinic.  Again, thank you for choosing [facility] at [facility]. Our hope is that these requests will allow you access to exceptional care and in a timely manner. _______________________________________________________________  If you have questions after your visit, please contact our office at [phone] between the hours of 8:30 a.m. and 5:00 p.m. Voicemails left after 4:30 p.m. will not be returned until the following business day. _______________________________________________________________  For prescription refill requests, have your pharmacy contact our office. _______________________________________________________________  Recommendations made by the consultant and any test results will be sent to your referring physician. _______________________________________________________________ 

## 2020-05-02 NOTE — Progress Notes (Signed)
Iron injection given per orders. Patient tolerated it well without problems. Vitals stable and discharged home from clinic ambulatory in stable condition. Follow up as scheduled.

## 2020-05-09 ENCOUNTER — Other Ambulatory Visit (HOSPITAL_COMMUNITY): Payer: Medicare Other

## 2020-05-10 ENCOUNTER — Ambulatory Visit (HOSPITAL_COMMUNITY): Payer: Medicare Other | Admitting: Hematology

## 2020-06-07 ENCOUNTER — Other Ambulatory Visit (HOSPITAL_COMMUNITY): Payer: Self-pay

## 2020-06-07 DIAGNOSIS — E559 Vitamin D deficiency, unspecified: Secondary | ICD-10-CM

## 2020-06-07 MED ORDER — ERGOCALCIFEROL 1.25 MG (50000 UT) PO CAPS: 50000.0000 [IU] | ORAL_CAPSULE | ORAL | 1 refills | Status: DC

## 2020-06-07 NOTE — Telephone Encounter (Signed)
Per notes patient continues Vitamin D2 1.25 MG.  Refill request processed.

## 2020-07-05 DIAGNOSIS — H43813 Vitreous degeneration, bilateral: Secondary | ICD-10-CM | POA: Diagnosis not present

## 2020-07-17 DIAGNOSIS — B349 Viral infection, unspecified: Secondary | ICD-10-CM | POA: Diagnosis not present

## 2020-07-17 DIAGNOSIS — H9202 Otalgia, left ear: Secondary | ICD-10-CM | POA: Diagnosis not present

## 2020-08-01 ENCOUNTER — Other Ambulatory Visit (HOSPITAL_COMMUNITY): Payer: Self-pay | Admitting: *Deleted

## 2020-08-01 DIAGNOSIS — E538 Deficiency of other specified B group vitamins: Secondary | ICD-10-CM

## 2020-08-01 DIAGNOSIS — D500 Iron deficiency anemia secondary to blood loss (chronic): Secondary | ICD-10-CM

## 2020-08-01 DIAGNOSIS — E559 Vitamin D deficiency, unspecified: Secondary | ICD-10-CM

## 2020-08-02 ENCOUNTER — Other Ambulatory Visit: Payer: Self-pay

## 2020-08-02 ENCOUNTER — Inpatient Hospital Stay (HOSPITAL_COMMUNITY): Payer: PRIVATE HEALTH INSURANCE

## 2020-08-02 ENCOUNTER — Inpatient Hospital Stay (HOSPITAL_COMMUNITY): Payer: PRIVATE HEALTH INSURANCE | Attending: Hematology

## 2020-08-02 DIAGNOSIS — E538 Deficiency of other specified B group vitamins: Secondary | ICD-10-CM | POA: Insufficient documentation

## 2020-08-02 DIAGNOSIS — E559 Vitamin D deficiency, unspecified: Secondary | ICD-10-CM | POA: Insufficient documentation

## 2020-08-02 DIAGNOSIS — K5090 Crohn's disease, unspecified, without complications: Secondary | ICD-10-CM | POA: Insufficient documentation

## 2020-08-02 DIAGNOSIS — K219 Gastro-esophageal reflux disease without esophagitis: Secondary | ICD-10-CM | POA: Diagnosis not present

## 2020-08-02 DIAGNOSIS — M810 Age-related osteoporosis without current pathological fracture: Secondary | ICD-10-CM | POA: Insufficient documentation

## 2020-08-02 DIAGNOSIS — D509 Iron deficiency anemia, unspecified: Secondary | ICD-10-CM | POA: Insufficient documentation

## 2020-08-02 DIAGNOSIS — D500 Iron deficiency anemia secondary to blood loss (chronic): Secondary | ICD-10-CM

## 2020-08-02 LAB — CBC WITH DIFFERENTIAL/PLATELET
Abs Immature Granulocytes: 0.02 10*3/uL (ref 0.00–0.07)
Basophils Absolute: 0.1 10*3/uL (ref 0.0–0.1)
Basophils Relative: 1 %
Eosinophils Absolute: 0.1 10*3/uL (ref 0.0–0.5)
Eosinophils Relative: 1 %
HCT: 40.3 % (ref 36.0–46.0)
Hemoglobin: 13.3 g/dL (ref 12.0–15.0)
Immature Granulocytes: 0 %
Lymphocytes Relative: 24 %
Lymphs Abs: 1.7 10*3/uL (ref 0.7–4.0)
MCH: 29.8 pg (ref 26.0–34.0)
MCHC: 33 g/dL (ref 30.0–36.0)
MCV: 90.2 fL (ref 80.0–100.0)
Monocytes Absolute: 0.5 10*3/uL (ref 0.1–1.0)
Monocytes Relative: 7 %
Neutro Abs: 4.9 10*3/uL (ref 1.7–7.7)
Neutrophils Relative %: 67 %
Platelets: 223 10*3/uL (ref 150–400)
RBC: 4.47 MIL/uL (ref 3.87–5.11)
RDW: 13.5 % (ref 11.5–15.5)
WBC: 7.3 10*3/uL (ref 4.0–10.5)
nRBC: 0 % (ref 0.0–0.2)

## 2020-08-02 LAB — IRON AND TIBC
Iron: 51 ug/dL (ref 28–170)
Saturation Ratios: 17 % (ref 10.4–31.8)
TIBC: 294 ug/dL (ref 250–450)
UIBC: 243 ug/dL

## 2020-08-02 LAB — COMPREHENSIVE METABOLIC PANEL
ALT: 27 U/L (ref 0–44)
AST: 23 U/L (ref 15–41)
Albumin: 4.1 g/dL (ref 3.5–5.0)
Alkaline Phosphatase: 93 U/L (ref 38–126)
Anion gap: 9 (ref 5–15)
BUN: 19 mg/dL (ref 8–23)
CO2: 23 mmol/L (ref 22–32)
Calcium: 9.2 mg/dL (ref 8.9–10.3)
Chloride: 106 mmol/L (ref 98–111)
Creatinine, Ser: 0.65 mg/dL (ref 0.44–1.00)
GFR, Estimated: >60 mL/min (ref >60)
Glucose, Bld: 106 mg/dL — ABNORMAL HIGH (ref 70–99)
Potassium: 3.7 mmol/L (ref 3.5–5.1)
Sodium: 138 mmol/L (ref 135–145)
Total Bilirubin: 0.6 mg/dL (ref 0.3–1.2)
Total Protein: 7.5 g/dL (ref 6.5–8.1)

## 2020-08-02 LAB — VITAMIN D 25 HYDROXY (VIT D DEFICIENCY, FRACTURES): Vit D, 25-Hydroxy: 52.38 ng/mL (ref 30–100)

## 2020-08-02 LAB — VITAMIN B12: Vitamin B-12: 232 pg/mL (ref 180–914)

## 2020-08-02 LAB — LACTATE DEHYDROGENASE: LDH: 139 U/L (ref 98–192)

## 2020-08-02 LAB — FERRITIN: Ferritin: 139 ng/mL (ref 11–307)

## 2020-08-09 ENCOUNTER — Other Ambulatory Visit: Payer: Self-pay

## 2020-08-09 ENCOUNTER — Inpatient Hospital Stay (HOSPITAL_BASED_OUTPATIENT_CLINIC_OR_DEPARTMENT_OTHER): Payer: PRIVATE HEALTH INSURANCE | Admitting: Hematology

## 2020-08-09 VITALS — BP 144/82 | HR 87 | Temp 97.1°F | Resp 20 | Wt 223.3 lb

## 2020-08-09 DIAGNOSIS — K219 Gastro-esophageal reflux disease without esophagitis: Secondary | ICD-10-CM | POA: Diagnosis not present

## 2020-08-09 DIAGNOSIS — E538 Deficiency of other specified B group vitamins: Secondary | ICD-10-CM | POA: Diagnosis not present

## 2020-08-09 DIAGNOSIS — M810 Age-related osteoporosis without current pathological fracture: Secondary | ICD-10-CM | POA: Diagnosis not present

## 2020-08-09 DIAGNOSIS — K5090 Crohn's disease, unspecified, without complications: Secondary | ICD-10-CM | POA: Diagnosis not present

## 2020-08-09 DIAGNOSIS — D509 Iron deficiency anemia, unspecified: Secondary | ICD-10-CM | POA: Diagnosis not present

## 2020-08-09 DIAGNOSIS — E559 Vitamin D deficiency, unspecified: Secondary | ICD-10-CM | POA: Diagnosis not present

## 2020-08-09 DIAGNOSIS — D500 Iron deficiency anemia secondary to blood loss (chronic): Secondary | ICD-10-CM

## 2020-08-09 NOTE — Progress Notes (Signed)
[address], [place] [address]   CLINIC:  Medical Oncology/Hematology  PCP:  Kathyrn Drown, [place] / [place] [address]  [phone]  REASON FOR VISIT:  Follow-up for IDA  PRIOR THERAPY: None  CURRENT THERAPY: Intermittent Feraheme last on 05/02/2020  INTERVAL HISTORY:  [name], a 70 y.o. female, returns for routine follow-up for her IDA. Rhythm was last seen by Francene Finders, NP, on 04/19/2020.  Today she reports feeling okay. She reports that her energy levels have decreased. She does not recall how long her energy levels stay elevated after receiving Feraheme, but reports that she feels best when she gets 2 Feraheme infusions. She tolerated the previous Feraheme well. She denies melena, hematochezia, hematuria or nosebleeds. She takes iron tablets, vitamin B12 daily and vitamin D weekly. She denies recent infections, F/C, night sweats or weight loss. She is not on any blood thinners.  She has not taken anything for her Crohn's since the disease has become very mild, according to her GI.   REVIEW OF SYSTEMS:  Review of Systems  Constitutional: Positive for fatigue (50%). Negative for appetite change, chills, diaphoresis, fever and unexpected weight change.  HENT:   Negative for nosebleeds.   Gastrointestinal: Negative for blood in stool.  Genitourinary: Negative for hematuria.   All other systems reviewed and are negative.   PAST MEDICAL/SURGICAL HISTORY:  Past Medical History:  Diagnosis Date  . Arthritis   . B12 deficiency 07/09/2015  . Crohn disease ([place]) JULY 2011 FEDA   PERSISTENT TI ULCERS seen on CE, NO NSAID USE  . Endometriosis    HISTORY  LEADING TO HYSTERECTOMY  . Fibromyalgia   . Fibromyalgia 12/09/2013  . Gastric ulcer   . GERD (gastroesophageal reflux disease)   . Headache   . Helicobacter pylori gastritis    HISTORY  . History of hiatal hernia   . Hives   . Iron (Fe) deficiency  anemia 2009   JAN 2012 NL HB & FERRITIN  . Obesity   . Osteoporosis 11/20/2014  . Plantar fasciitis    Past Surgical History:  Procedure Laterality Date  . ABDOMINAL HYSTERECTOMY    . CATARACT EXTRACTION W/PHACO Right 12/05/2015   Procedure: CATARACT EXTRACTION PHACO AND INTRAOCULAR LENS PLACEMENT (IOC);  Surgeon: Birder Robson, MD;  Location: [facility] ORS;  Service: Ophthalmology;  Laterality: Right;  [place] 00:59.9 AP% 21.9 CDE 13.10 fluid pack lot # [id number]  . CATARACT EXTRACTION W/PHACO Left 06/24/2016   Procedure: CATARACT EXTRACTION PHACO AND INTRAOCULAR LENS PLACEMENT LEFT EYE; CDE:  8.33;  Surgeon: Tonny Branch, MD;  Location: [facility];  Service: Ophthalmology;  Laterality: Left;  . COLONOSCOPY    . ESOPHAGOGASTRODUODENOSCOPY  10/14/2007   Esophagus showed 2 cm pink tongue seen extending from the GE junction.  Biopsies obtained via cold forceps.  Otherwise the  esophagus was without erosions, mass, ulceration, or stricture / . Large hiatal hernia pouch with linear erosions, and two 3-6 mmulcers seen in the pouch.  Biopsies obtained via cold forceps to  evaluate for H. pylori gastritis or malignancy  . Ileocolonoscopy  10/14/2007   Multiple 1-3 mm terminal ileum ulcers biopsied  The ulcers seemed to only involve the last 5 cm of her terminal ileum, and 10 cm of her ileum was visualized.  Otherwise the colon  was without polyps, masses, diverticula, or AVMs.  She had a normal retroflexed view of the rectum  . OVARIAN CYST REMOVAL    .  TOTAL ABDOMINAL HYSTERECTOMY W/ BILATERAL SALPINGOOPHORECTOMY    . UPPER GASTROINTESTINAL ENDOSCOPY  JULY 2011 DIARRHEA/FEDA    Bx-H.PYLORI GASTRITIS, NL DUODENUM    SOCIAL HISTORY:  Social History   Socioeconomic History  . Marital status: Married    Spouse name: [name]   . Number of children: 3  . Years of education: 61  . Highest education level: Not on file  Occupational History  . Occupation: Retired  Tobacco Use  . Smoking status: Never Smoker  .  Smokeless tobacco: Never Used  Vaping Use  . Vaping Use: Never used  Substance and Sexual Activity  . Alcohol use: No  . Drug use: No  . Sexual activity: Yes    Birth control/protection: Surgical  Other Topics Concern  . Not on file  Social History Narrative   Lives w/ husband   Caffeine use: Drinks 1-2 cups coffee per day   Social Determinants of Health   Financial Resource Strain: Not on file  Food Insecurity: Not on file  Transportation Needs: Not on file  Physical Activity: Not on file  Stress: Not on file  Social Connections: Not on file  Intimate Partner Violence: Not on file    FAMILY HISTORY:  Family History  Problem Relation Age of Onset  . Diabetes Mother   . Cancer Father   . Diabetes Father   . Diabetes Sister   . Diabetes Brother   . Colon polyps Neg Hx   . Colon cancer Neg Hx   . Stroke Neg Hx     CURRENT MEDICATIONS:  Current Outpatient Medications  Medication Sig Dispense Refill  . ergocalciferol (VITAMIN D2) 1.25 MG (50000 UT) capsule Take 1 capsule (50,000 Units total) by mouth once a week. 30 capsule 1  . vitamin B-12 (CYANOCOBALAMIN) 500 MCG tablet Take 500 mcg by mouth daily.    Marland Kitchen acetaminophen (TYLENOL) 325 MG tablet Take 650 mg by mouth as needed for moderate pain or headache.  (Patient not taking: Reported on 08/09/2020)     No current facility-administered medications for this visit.   Facility-Administered Medications Ordered in Other Visits  Medication Dose Route Frequency Provider Last Rate Last Admin  . 0.9 %  sodium chloride infusion   Intravenous Continuous Derek Jack, MD   Paused at 11/17/18 0932    ALLERGIES:  Allergies  Allergen Reactions  . Cefzil [Cefprozil] Rash  . Celebrex [Celecoxib] Hives and Itching  . Sulfonamide Derivatives Itching    PHYSICAL EXAM:  Performance status (ECOG): 1 - Symptomatic but completely ambulatory  Vitals:   08/09/20 1515  BP: (!) 144/82  Pulse: 87  Resp: 20  Temp: (!) 97.1 F  (36.2 C)  SpO2: 97%   Wt Readings from Last 3 Encounters:  08/09/20 223 lb 4.8 oz (101.3 kg)  04/19/20 219 lb 11.2 oz (99.7 kg)  01/05/20 220 lb 12.8 oz (100.2 kg)   Physical Exam Vitals reviewed.  Constitutional:      Appearance: Normal appearance. She is obese.  Cardiovascular:     Rate and Rhythm: Normal rate and regular rhythm.     Pulses: Normal pulses.     Heart sounds: Normal heart sounds.  Pulmonary:     Effort: Pulmonary effort is normal.     Breath sounds: Normal breath sounds.  Chest:  Breasts:     Right: No axillary adenopathy or supraclavicular adenopathy.     Left: No axillary adenopathy or supraclavicular adenopathy.    Musculoskeletal:     Right lower leg: No  edema.     Left lower leg: No edema.  Lymphadenopathy:     Upper Body:     Right upper body: No supraclavicular, axillary or pectoral adenopathy.     Left upper body: No supraclavicular, axillary or pectoral adenopathy.  Neurological:     General: No focal deficit present.     Mental Status: She is alert and oriented to person, place, and time.  Psychiatric:        Mood and Affect: Mood normal.        Behavior: Behavior normal.     LABORATORY DATA:  I have reviewed the labs as listed.  CBC Latest Ref Rng & Units 08/02/2020 04/13/2020 12/30/2019  WBC 4.0 - 10.5 K/uL 7.3 8.2 7.6  Hemoglobin 12.0 - 15.0 g/dL 13.3 13.0 12.3  Hematocrit 36.0 - 46.0 % 40.3 40.5 38.3  Platelets 150 - 400 K/uL 223 248 251   CMP Latest Ref Rng & Units 08/02/2020 04/13/2020 12/30/2019  Glucose 70 - 99 mg/dL 106(H) 107(H) 130(H)  BUN 8 - 23 mg/dL 19 19 20   Creatinine 0.44 - 1.00 mg/dL 0.65 0.72 0.64  Sodium 135 - 145 mmol/L 138 137 141  Potassium 3.5 - 5.1 mmol/L 3.7 3.8 3.8  Chloride 98 - 111 mmol/L 106 104 107  CO2 22 - 32 mmol/L 23 22 21(L)  Calcium 8.9 - 10.3 mg/dL 9.2 9.1 9.1  Total Protein 6.5 - 8.1 g/dL 7.5 7.0 7.2  Total Bilirubin 0.3 - 1.2 mg/dL 0.6 0.6 0.5  Alkaline Phos 38 - 126 U/L 93 77 84  AST 15 - 41 U/L 23  16 17   ALT 0 - 44 U/L 27 21 20       Component Value Date/Time   RBC 4.47 08/02/2020 1515   MCV 90.2 08/02/2020 1515   MCH 29.8 08/02/2020 1515   MCHC 33.0 08/02/2020 1515   RDW 13.5 08/02/2020 1515   LYMPHSABS 1.7 08/02/2020 1515   MONOABS 0.5 08/02/2020 1515   EOSABS 0.1 08/02/2020 1515   BASOSABS 0.1 08/02/2020 1515   Lab Results  Component Value Date   LDH 139 08/02/2020   LDH 131 04/13/2020   LDH 141 12/30/2019   Lab Results  Component Value Date   VD25OH 52.38 08/02/2020   VD25OH 50.46 04/13/2020   VD25OH 62.68 12/30/2019   Lab Results  Component Value Date   TIBC 294 08/02/2020   TIBC 277 04/13/2020   TIBC 305 12/30/2019   FERRITIN 139 08/02/2020   FERRITIN 104 04/13/2020   FERRITIN 56 12/30/2019   IRONPCTSAT 17 08/02/2020   IRONPCTSAT 19 04/13/2020   IRONPCTSAT 12 12/30/2019    DIAGNOSTIC IMAGING:  I have independently reviewed the scans and discussed with the patient. No results found.   ASSESSMENT:  1.  Iron deficiency anemia: - She has felt to have anemia due to chronic blood loss and malabsorption.   - She requires intermittent Feraheme. -Patient does best with every 3 months infusions due to her extreme fatigue if she gets below 100 on her ferritin.   2.  B12 deficiency: -  She was initially treated with B12 injections, currently on B12 tablet daily.  3.  Vitamin D deficiency: -She has been started on 50,000 units weekly vitamin D for 1 year.  4.  Crohn's disease: - She has not seen her GI doctor since COVID-19 pandemic.    PLAN:  1.  Iron deficiency anemia: - Reviewed labs from 08/02/2020.  Ferritin is 139.  Hemoglobin 13.3. -She reports feeling tired  for the past few weeks.  She feels very tired whenever her iron levels are dropping. - Recommend Feraheme weekly x2. - RTC 3 months for follow-up.  2.  B12 deficiency: - B12 is 232.  Continue B12 1 mg tablet daily.  3.  Vitamin D deficiency: - Vitamin D is 52.  I recommended  switching to daily vitamin D 2000-5000 units/day.  However patient would like to continue 50,000 units weekly.  4.  Crohn's disease: - Her disease is inactive for the last 3 years.  No treatment at this time.    Orders placed this encounter:  No orders of the defined types were placed in this encounter.    Derek Jack, MD [place] [phone]   I, Milinda Antis, am acting as a scribe for Dr. Sanda Linger.  I, Derek Jack MD, have reviewed the above documentation for accuracy and completeness, and I agree with the above.

## 2020-08-09 NOTE — Patient Instructions (Signed)
[place] at [facility] Discharge Instructions  You were seen today by Dr. Delton Coombes. He went over your recent results. You will be scheduled to receive 2 iron infusions 1 week apart. Dr. Delton Coombes will see you back in 3 months for labs and follow up.   Thank you for choosing [place] at [facility] to provide your oncology and hematology care.  To afford each patient quality time with our provider, please arrive at least 15 minutes before your scheduled appointment time.   If you have a lab appointment with the [place] please come in thru the Main Entrance and check in at the main information desk  You need to re-schedule your appointment should you arrive 10 or more minutes late.  We strive to give you quality time with our providers, and arriving late affects you and other patients whose appointments are after yours.  Also, if you no show three or more times for appointments you may be dismissed from the clinic at the providers discretion.     Again, thank you for choosing [facility].  Our hope is that these requests will decrease the amount of time that you wait before being seen by our physicians.       _____________________________________________________________  Should you have questions after your visit to [facility], please contact our office at [phone] between the hours of 8:00 a.m. and 4:30 p.m.  Voicemails left after 4:00 p.m. will not be returned until the following business day.  For prescription refill requests, have your pharmacy contact our office and allow 72 hours.    Cancer Center Support Programs:   > Cancer Support Group  2nd Tuesday of the month 1pm-2pm, Journey Room

## 2020-08-16 ENCOUNTER — Ambulatory Visit (HOSPITAL_COMMUNITY): Payer: Medicare Other

## 2020-08-23 ENCOUNTER — Inpatient Hospital Stay (HOSPITAL_COMMUNITY): Payer: PRIVATE HEALTH INSURANCE

## 2020-08-23 ENCOUNTER — Other Ambulatory Visit: Payer: Self-pay

## 2020-08-23 VITALS — BP 147/57 | HR 71 | Temp 97.1°F | Resp 18

## 2020-08-23 DIAGNOSIS — D509 Iron deficiency anemia, unspecified: Secondary | ICD-10-CM | POA: Diagnosis not present

## 2020-08-23 DIAGNOSIS — K5090 Crohn's disease, unspecified, without complications: Secondary | ICD-10-CM | POA: Diagnosis not present

## 2020-08-23 DIAGNOSIS — M810 Age-related osteoporosis without current pathological fracture: Secondary | ICD-10-CM | POA: Diagnosis not present

## 2020-08-23 DIAGNOSIS — E538 Deficiency of other specified B group vitamins: Secondary | ICD-10-CM | POA: Diagnosis not present

## 2020-08-23 DIAGNOSIS — E559 Vitamin D deficiency, unspecified: Secondary | ICD-10-CM | POA: Diagnosis not present

## 2020-08-23 DIAGNOSIS — K219 Gastro-esophageal reflux disease without esophagitis: Secondary | ICD-10-CM | POA: Diagnosis not present

## 2020-08-23 DIAGNOSIS — D500 Iron deficiency anemia secondary to blood loss (chronic): Secondary | ICD-10-CM

## 2020-08-23 MED ORDER — SODIUM CHLORIDE 0.9 % IV SOLN
510.0000 mg | Freq: Once | INTRAVENOUS | Status: AC
  Administered 2020-08-23: 510 mg via INTRAVENOUS
  Filled 2020-08-23: qty 510

## 2020-08-23 MED ORDER — SODIUM CHLORIDE 0.9 % IV SOLN: INTRAVENOUS | Status: DC

## 2020-08-23 NOTE — Progress Notes (Signed)
Tolerated iron transfusion without incidence today.  Discharged ambulatory in stable condition.  Vital signs stable prior to discharge.

## 2020-08-23 NOTE — Patient Instructions (Signed)
[facility] Discharge Instructions for Patients Receiving Chemotherapy  Today you received the following chemotherapy agents   To help prevent nausea and vomiting after your treatment, we encourage you to take your nausea medication   If you develop nausea and vomiting that is not controlled by your nausea medication, call the clinic.   BELOW ARE SYMPTOMS THAT SHOULD BE REPORTED IMMEDIATELY:  *FEVER GREATER THAN 100.5 F  *CHILLS WITH OR WITHOUT FEVER  NAUSEA AND VOMITING THAT IS NOT CONTROLLED WITH YOUR NAUSEA MEDICATION  *UNUSUAL SHORTNESS OF BREATH  *UNUSUAL BRUISING OR BLEEDING  TENDERNESS IN MOUTH AND THROAT WITH OR WITHOUT PRESENCE OF ULCERS  *URINARY PROBLEMS  *BOWEL PROBLEMS  UNUSUAL RASH Items with * indicate a potential emergency and should be followed up as soon as possible.  Feel free to call the clinic should you have any questions or concerns. The clinic phone number is [phone].  Please show the CHEMO ALERT CARD at check-in to the Emergency Department and triage nurse.   

## 2020-08-30 ENCOUNTER — Inpatient Hospital Stay (HOSPITAL_COMMUNITY): Payer: Medicare HMO | Attending: Hematology

## 2020-08-30 ENCOUNTER — Other Ambulatory Visit: Payer: Self-pay

## 2020-08-30 VITALS — BP 129/52 | HR 78 | Temp 96.9°F | Resp 18

## 2020-08-30 DIAGNOSIS — D500 Iron deficiency anemia secondary to blood loss (chronic): Secondary | ICD-10-CM

## 2020-08-30 DIAGNOSIS — D509 Iron deficiency anemia, unspecified: Secondary | ICD-10-CM | POA: Insufficient documentation

## 2020-08-30 MED ORDER — SODIUM CHLORIDE 0.9 % IV SOLN
510.0000 mg | Freq: Once | INTRAVENOUS | Status: AC
  Administered 2020-08-30: 510 mg via INTRAVENOUS
  Filled 2020-08-30: qty 17

## 2020-08-30 MED ORDER — SODIUM CHLORIDE 0.9 % IV SOLN: INTRAVENOUS | Status: DC

## 2020-08-30 NOTE — Progress Notes (Signed)
Patient presents today for Feraheme infusion.  Vitals signs WNL.  No new complaints since last visit.  Treatment given today per MD orders.  Tolerated infusion without adverse affects.  Vital signs stable.  No complaints at this time.  Patient refused to stay the thirty minute post infusion wait time, stating that she had somewhere to be.  Discharge from clinic ambulatory in stable condition.  Alert and oriented X 3.  Follow up with [facility] as scheduled.

## 2020-08-30 NOTE — Patient Instructions (Signed)
[facility] Discharge Instructions for Patients Receiving Chemotherapy  Today you received the following chemotherapy agents   To help prevent nausea and vomiting after your treatment, we encourage you to take your nausea medication   If you develop nausea and vomiting that is not controlled by your nausea medication, call the clinic.   BELOW ARE SYMPTOMS THAT SHOULD BE REPORTED IMMEDIATELY:  *FEVER GREATER THAN 100.5 F  *CHILLS WITH OR WITHOUT FEVER  NAUSEA AND VOMITING THAT IS NOT CONTROLLED WITH YOUR NAUSEA MEDICATION  *UNUSUAL SHORTNESS OF BREATH  *UNUSUAL BRUISING OR BLEEDING  TENDERNESS IN MOUTH AND THROAT WITH OR WITHOUT PRESENCE OF ULCERS  *URINARY PROBLEMS  *BOWEL PROBLEMS  UNUSUAL RASH Items with * indicate a potential emergency and should be followed up as soon as possible.  Feel free to call the clinic should you have any questions or concerns. The clinic phone number is [phone].  Please show the CHEMO ALERT CARD at check-in to the Emergency Department and triage nurse.   

## 2020-10-03 ENCOUNTER — Encounter: Payer: Self-pay | Admitting: Family Medicine

## 2020-10-03 ENCOUNTER — Other Ambulatory Visit: Payer: Self-pay

## 2020-10-03 ENCOUNTER — Ambulatory Visit (HOSPITAL_COMMUNITY)
Admission: RE | Admit: 2020-10-03 | Discharge: 2020-10-03 | Disposition: A | Discharge status: Home / Self Care | Payer: Medicare HMO | Source: Ambulatory Visit | Attending: Family Medicine | Admitting: Family Medicine

## 2020-10-03 ENCOUNTER — Ambulatory Visit (INDEPENDENT_AMBULATORY_CARE_PROVIDER_SITE_OTHER): Payer: Medicare HMO | Admitting: Family Medicine

## 2020-10-03 VITALS — BP 117/78 | HR 91 | Temp 95.2°F | Wt 223.0 lb

## 2020-10-03 DIAGNOSIS — M5431 Sciatica, right side: Secondary | ICD-10-CM | POA: Insufficient documentation

## 2020-10-03 DIAGNOSIS — M5441 Lumbago with sciatica, right side: Secondary | ICD-10-CM | POA: Insufficient documentation

## 2020-10-03 DIAGNOSIS — M5432 Sciatica, left side: Secondary | ICD-10-CM | POA: Insufficient documentation

## 2020-10-03 NOTE — Patient Instructions (Signed)
Managing Chronic Back Pain Chronic back pain is back pain that lasts for 12 weeks or longer. It often affects the lower back. Back pain may feel like a muscle ache or a sharp, stabbing pain. It can be mild, moderate, or severe. If you have been diagnosed with chronic back pain, there are things you can do to manage your symptoms. You may have to try different things to see what works best for you. Your health care provider may also give you specific instructions. How to manage lifestyle changes Treating chronic back pain often starts with rest and pain relief, followed by exercises to restore movement and strength to your back (physical therapy). You may need surgery if other treatments do not help, or if your pain is caused by a condition or an injury. Follow your treatment plan as told by your health care provider. This may include:  Relaxation techniques.  Talk therapy or counseling with a mental health specialist. A form of talk therapy called cognitive behavioral therapy (CBT) can be especially helpful. This therapy helps you set goals and follow up on the changes that you make.  Acupuncture or massage therapy.  Local electrical stimulation.  Injections. These deliver numbing or pain-relieving medicines into your spine or the area of pain. How to recognize changes in your chronic back pain Your condition may improve with treatment. However, back pain may not go away or may get worse over time. Watch your symptoms carefully and let your health care provider know if your symptoms get worse or do not improve. Your back pain may be getting worse if you have:  Pain that begins to cause problems with posture.  Pain that gets worse when you are sitting, standing, walking, bending, or lifting.  Pain that affects you while you are active, or at rest, or both.  Pain that eventually makes it hard to move around (limits mobility).  Pain that occurs with fever, weight loss, or difficulty  urinating.  Pain that causes numbness and tingling. How to use body mechanics and posture to help with pain Healthy body mechanics and good posture can help to relieve stress on your back. Body mechanics refers to the movements and positions of your body during your daily activities. Posture is part of body mechanics. Good posture means:  Your spine is in its natural S-curve, or neutral, position.  Your shoulders are pulled back slightly.  Your head is not tipped forward. Follow these guidelines to improve your posture and body mechanics in your everyday activities. Standing  When standing, keep your spine neutral and your feet about hip-width apart. Keep your knees slightly bent. Your ears, shoulders, and hips should line up.  When you do a task in which you stand in one place for a long time, place one foot on a stable object that is 2-4 inches (5-10 cm) high, such as a footstool. This helps keep your spine neutral.   Sitting  When sitting, keep your spine neutral and your feet flat on the floor. Use a footrest, if necessary, and keep your thighs parallel to the floor. Avoid rounding your shoulders, and avoid tilting your head forward.  When working at a desk or a computer, keep your desk at a height where your hands are slightly lower than your elbows. Slide your chair under your desk so you are close enough to maintain good posture.  When working at a computer, place your monitor at a height where you are looking straight ahead and you do  not have to tilt your head forward or downward to view the screen.   Lifting  Keep your feet at least shoulder-width apart and tighten the muscles of your abdomen.  Bend your knees and hips and keep your spine neutral. Be sure to lift using the strength of your legs, not your back. Do not lock your knees straight out.  Always ask for help to lift heavy or awkward objects.   Resting  When lying down and resting, avoid positions that are most  painful.  If you have pain with activities such as sitting, bending, stooping, or squatting, lie in a position in which your body does not bend very much. For example, avoid curling up on your side with your arms and knees near your chest (fetal position).  If you have pain with activities such as standing for a long time or reaching with your arms, lie with your spine in a neutral position and bend your knees slightly. Try: ? Lying on your side with a pillow between your knees. ? Lying on your back with a pillow under your knees.   Follow these instructions at home: Medicines  Treatment may include over-the-counter or prescription medicines for pain and inflammation that are taken by mouth or applied to the skin. Another treatment may include muscle relaxants. Take over-the-counter and prescription medicines only as told by your health care provider.  Ask your health care provider if the medicine prescribed to you: ? Requires you to avoid driving or using machinery. ? Can cause constipation. You may need to take these actions to prevent or treat constipation:  Drink enough fluid to keep your urine pale yellow.  Take over-the-counter or prescription medicines.  Eat foods that are high in fiber, such as beans, whole grains, and fresh fruits and vegetables.  Limit foods that are high in fat and processed sugars, such as fried or sweet foods. Lifestyle  Do not use any products that contain nicotine or tobacco, such as cigarettes, e-cigarettes, and chewing tobacco. If you need help quitting, ask your health care provider.  Eat a healthy diet that includes foods such as vegetables, fruits, fish, and lean meats.  Work with your health care provider to achieve or maintain a healthy weight. General instructions  Get regular exercise as told. Exercise improves flexibility and strength.  If physical therapy was prescribed, do exercises as told by your health care provider.  Use ice or heat  therapy as told by your health care provider.  Keep all follow-up visits as told by your health care provider. This is important. Where can I get support? Consider joining a support group for people managing chronic back pain. Ask your health care provider about support groups in your area. You can also find online and in-person support groups through:  The American Chronic Pain Association: theacpa.org  Pain Connection Program: painconnection.org Contact a health care provider if:  You have pain that is not relieved with rest or medicine.  Your pain gets worse, or you have new pain.  You have a fever.  You have rapid weight loss.  You have trouble doing your normal activities. Get help right away if:  You have weakness or numbness in one or both of your legs or feet.  You have trouble controlling your bladder or your bowels.  You have severe back pain and have any of the following: ? Nausea or vomiting. ? Abdominal pain. ? Shortness of breath or you faint. Summary  Chronic back pain is  often treated with rest, pain relief, and physical therapy.  Talk therapy, acupuncture, massage, and local electrical stimulation may help.  Follow your treatment plan as told by your health care provider.  Joining a support group may help you manage chronic back pain. This information is not intended to replace advice given to you by your health care provider. Make sure you discuss any questions you have with your health care provider. Document Revised: 08/26/2019 Document Reviewed: 05/04/2019 Elsevier Patient Education  [place].

## 2020-10-03 NOTE — Progress Notes (Signed)
Pt having issues with leg pain. Mostly right leg but happens in left leg. Pt works in [employer] and stands for long periods of time. Pt states that sometime she will have pains that shoot up leg and into lower back. Does have history of leg swelling mostly in left leg.       Patient ID: [name], female    DOB: 12-17-1950, 70 y.o.   MRN: [id number]   Chief Complaint  Patient presents with  . Leg Pain   Subjective:  CC: No back pain and bilateral leg pain  This is a chronic problem.  Presents today with a complaint of low back pain and bilateral leg pain.  Right leg pain has been worse since Thursday.  Works as a Programme researcher, broadcasting/film/video at [employer], on her feet constantly throughout her shift, this makes the pain worse.  Has tried ice, heat, and Tylenol.  Has a history of Crohn's disease, does not tolerate NSAIDs.  Denies weakness, reports that she feels a "shooting" pain when picking up the right leg to walk.  Does not desire to be out of work, does not desire to see physical therapy.  Wonders if she has arthritis.  Does not want to address bilateral leg swelling at this visit, does not think this is related to complaint today.  Denies fever, chills, chest pain, shortness of breath.    Medical History Cyenna has a past medical history of Arthritis, B12 deficiency (07/09/2015), Crohn disease ([place]) (JULY 2011 FEDA), Endometriosis, Fibromyalgia, Fibromyalgia (12/09/2013), Gastric ulcer, GERD (gastroesophageal reflux disease), Headache, Helicobacter pylori gastritis, History of hiatal hernia, Hives, Iron (Fe) deficiency anemia (2009), Obesity, Osteoporosis (11/20/2014), and Plantar fasciitis.   Outpatient Encounter Medications as of 10/03/2020  Medication Sig  . acetaminophen (TYLENOL) 325 MG tablet Take 650 mg by mouth as needed for moderate pain or headache.  . ergocalciferol (VITAMIN D2) 1.25 MG (50000 UT) capsule Take 1 capsule (50,000 Units total) by mouth once a week.  . vitamin B-12 (CYANOCOBALAMIN) 500  MCG tablet Take 500 mcg by mouth daily.   Facility-Administered Encounter Medications as of 10/03/2020  Medication  . 0.9 %  sodium chloride infusion     Review of Systems  Constitutional: Negative for chills and fever.  Respiratory: Negative for shortness of breath.   Cardiovascular: Negative for chest pain.  Gastrointestinal: Negative for abdominal pain.  Musculoskeletal: Positive for back pain.       Bilateral leg pain     Vitals BP 117/78   Pulse 91   Temp (!) 95.2 F (35.1 C)   Wt 223 lb (101.2 kg)   SpO2 93%   BMI 38.28 kg/m   Objective:   Physical Exam Vitals reviewed.  Constitutional:      Appearance: Normal appearance.  Cardiovascular:     Rate and Rhythm: Normal rate and regular rhythm.     Heart sounds: Normal heart sounds.  Pulmonary:     Effort: Pulmonary effort is normal.     Breath sounds: Normal breath sounds.  Skin:    General: Skin is warm and dry.  Neurological:     General: No focal deficit present.     Mental Status: She is alert.     Motor: No weakness.     Coordination: Romberg sign negative. Coordination normal. Finger-Nose-Finger Test and Heel to [facility] Test normal.     Gait: Gait normal.     Comments: 5/5 upper and lower extremity strength.  Negative straight leg raise bilaterally.  Psychiatric:  Behavior: Behavior normal.      Assessment and Plan   1. Acute midline low back pain with right-sided sciatica - DG FEMUR MIN 2 VIEWS LEFT - DG FEMUR, MIN 2 VIEWS RIGHT - DG Hip Unilat W OR W/O Pelvis Min 4 Views Left - DG Hip Unilat W OR W/O Pelvis Min 4 Views Right  2. Bilateral sciatica - DG FEMUR MIN 2 VIEWS LEFT - DG FEMUR, MIN 2 VIEWS RIGHT - DG Hip Unilat W OR W/O Pelvis Min 4 Views Left - DG Hip Unilat W OR W/O Pelvis Min 4 Views Right   Desires to have x-rays done today.  Provider wishes to rule out fracture, no known injury, low back extremely tender to touch.  Will go to [facility] for x-rays of the pelvis and  bilateral femurs.  If x-rays are negative, she will continue to use Tylenol for pain and will place a orthopedic referral, to see Dr. Aline Brochure.    Agrees with plan of care discussed today. Understands warning signs to seek further care: chest pain, shortness of breath, any significant change in health.  Understands to follow-up here if symptoms worsen, do not improve, plan to refer to orthopedics for further evaluation and treatment. Will notify once x-ray results become available.     Chalmers Guest, NP 10/03/2020

## 2020-10-05 ENCOUNTER — Other Ambulatory Visit: Payer: Self-pay | Admitting: Family Medicine

## 2020-10-05 DIAGNOSIS — M1990 Unspecified osteoarthritis, unspecified site: Secondary | ICD-10-CM

## 2020-10-23 ENCOUNTER — Other Ambulatory Visit: Payer: Self-pay

## 2020-10-23 ENCOUNTER — Encounter: Payer: Self-pay | Admitting: Orthopedic Surgery

## 2020-10-23 ENCOUNTER — Ambulatory Visit: Payer: Medicare HMO

## 2020-10-23 ENCOUNTER — Ambulatory Visit: Payer: Medicare HMO | Admitting: Orthopedic Surgery

## 2020-10-23 VITALS — BP 163/80 | HR 81 | Ht 64.0 in | Wt 221.1 lb

## 2020-10-23 DIAGNOSIS — G8929 Other chronic pain: Secondary | ICD-10-CM

## 2020-10-23 DIAGNOSIS — M5441 Lumbago with sciatica, right side: Secondary | ICD-10-CM | POA: Diagnosis not present

## 2020-10-23 NOTE — Progress Notes (Signed)
NEW PROBLEM//OFFICE VISIT  Summary assessment and plan:   Lower back pain with some intermittent radiculopathy  Recommend physical therapy  Follow-up in 6 weeks  Chief Complaint  Patient presents with  . Hip Pain    Bilateral/ but the right one is the worse. It has been bothering me for several weeks now.    70 year old female has been having some intermittent buttock pain with leg discomfort for some time but got up one morning had severe pain down her right leg all the way to the foot progressively got worse.  She rubbed some medication and used heat it seems a little bit better now although the symptoms seem to be more intermittent with right greater than left leg pain   Review of Systems  Constitutional: Negative for fever.  Respiratory: Negative for shortness of breath.   Cardiovascular: Negative for chest pain.  Musculoskeletal: Positive for back pain.  Skin: Negative.   Neurological: Negative for tingling and sensory change.     Past Medical History:  Diagnosis Date  . Arthritis   . B12 deficiency 07/09/2015  . Crohn disease ([place]) JULY 2011 FEDA   PERSISTENT TI ULCERS seen on CE, NO NSAID USE  . Endometriosis    HISTORY  LEADING TO HYSTERECTOMY  . Fibromyalgia   . Fibromyalgia 12/09/2013  . Gastric ulcer   . GERD (gastroesophageal reflux disease)   . Headache   . Helicobacter pylori gastritis    HISTORY  . History of hiatal hernia   . Hives   . Iron (Fe) deficiency anemia 2009   JAN 2012 NL HB & FERRITIN  . Obesity   . Osteoporosis 11/20/2014  . Plantar fasciitis     Past Surgical History:  Procedure Laterality Date  . ABDOMINAL HYSTERECTOMY    . CATARACT EXTRACTION W/PHACO Right 12/05/2015   Procedure: CATARACT EXTRACTION PHACO AND INTRAOCULAR LENS PLACEMENT (IOC);  Surgeon: Birder Robson, MD;  Location: [facility] ORS;  Service: Ophthalmology;  Laterality: Right;  [place] 00:59.9 AP% 21.9 CDE 13.10 fluid pack lot # [id number]  . CATARACT EXTRACTION W/PHACO Left  06/24/2016   Procedure: CATARACT EXTRACTION PHACO AND INTRAOCULAR LENS PLACEMENT LEFT EYE; CDE:  8.33;  Surgeon: Tonny Branch, MD;  Location: [facility];  Service: Ophthalmology;  Laterality: Left;  . COLONOSCOPY    . ESOPHAGOGASTRODUODENOSCOPY  10/14/2007   Esophagus showed 2 cm pink tongue seen extending from the GE junction.  Biopsies obtained via cold forceps.  Otherwise the  esophagus was without erosions, mass, ulceration, or stricture / . Large hiatal hernia pouch with linear erosions, and two 3-6 mmulcers seen in the pouch.  Biopsies obtained via cold forceps to  evaluate for H. pylori gastritis or malignancy  . Ileocolonoscopy  10/14/2007   Multiple 1-3 mm terminal ileum ulcers biopsied  The ulcers seemed to only involve the last 5 cm of her terminal ileum, and 10 cm of her ileum was visualized.  Otherwise the colon  was without polyps, masses, diverticula, or AVMs.  She had a normal retroflexed view of the rectum  . OVARIAN CYST REMOVAL    . TOTAL ABDOMINAL HYSTERECTOMY W/ BILATERAL SALPINGOOPHORECTOMY    . UPPER GASTROINTESTINAL ENDOSCOPY  JULY 2011 DIARRHEA/FEDA    Bx-H.PYLORI GASTRITIS, NL DUODENUM    Family History  Problem Relation Age of Onset  . Diabetes Mother   . Cancer Father   . Diabetes Father   . Diabetes Sister   . Diabetes Brother   . Colon polyps Neg Hx   . Colon  cancer Neg Hx   . Stroke Neg Hx    Social History   Tobacco Use  . Smoking status: Never Smoker  . Smokeless tobacco: Never Used  Vaping Use  . Vaping Use: Never used  Substance Use Topics  . Alcohol use: No  . Drug use: No    Allergies  Allergen Reactions  . Cefzil [Cefprozil] Rash  . Celebrex [Celecoxib] Hives and Itching  . Sulfonamide Derivatives Itching    Current Meds  Medication Sig  . acetaminophen (TYLENOL) 325 MG tablet Take 650 mg by mouth as needed for moderate pain or headache.  . ergocalciferol (VITAMIN D2) 1.25 MG (50000 UT) capsule Take 1 capsule (50,000 Units total) by mouth  once a week.  . vitamin B-12 (CYANOCOBALAMIN) 500 MCG tablet Take 500 mcg by mouth daily.    BP (!) 163/80   Pulse 81   Ht 5' 4"  (1.626 m)   Wt 221 lb 2 oz (100.3 kg)   BMI 37.96 kg/m   Physical Exam  General appearance: Well-developed well-nourished no gross deformities  Cardiovascular normal pulse and perfusion normal color without edema  Neurologically o sensation loss or deficits or pathologic reflexes  Psychological: Awake alert and oriented x3 mood and affect normal  Skin no lacerations or ulcerations no nodularity no palpable masses, no erythema or nodularity  Musculoskeletal:   Right and left hip move normally without any pain on range of motion  Tenderness right and left side of the lower back as well as the midline with increased lordosis    MEDICAL DECISION MAKING  A.  Encounter Diagnosis  Name Primary?  . Chronic right-sided low back pain with right-sided sciatica Yes    B. DATA ANALYSED:   IMAGING: Interpretation of images: External images of the hip and pelvis were normal although read as arthritis  Our internal imaging showed a scoliosis with degenerative disc disease  Orders: Physical therapy  Outside records reviewed: None   C. MANAGEMENT   PT  No orders of the defined types were placed in this encounter.   Chronic with exacerbation physical therapy external imaging  Arther Abbott, MD  10/23/2020 2:35 PM

## 2020-10-23 NOTE — Patient Instructions (Signed)
Physical therapy has been ordered for you at [facility]. They should call you to schedule, [phone] is the phone number to call , if you want to call to schedule. Please call them  if you do not hear anything within one week.

## 2020-10-31 ENCOUNTER — Other Ambulatory Visit: Payer: Self-pay

## 2020-10-31 ENCOUNTER — Inpatient Hospital Stay (HOSPITAL_COMMUNITY): Payer: Medicare HMO | Attending: Hematology

## 2020-10-31 DIAGNOSIS — D509 Iron deficiency anemia, unspecified: Secondary | ICD-10-CM | POA: Insufficient documentation

## 2020-10-31 DIAGNOSIS — E538 Deficiency of other specified B group vitamins: Secondary | ICD-10-CM

## 2020-10-31 DIAGNOSIS — E559 Vitamin D deficiency, unspecified: Secondary | ICD-10-CM

## 2020-10-31 DIAGNOSIS — D500 Iron deficiency anemia secondary to blood loss (chronic): Secondary | ICD-10-CM

## 2020-10-31 LAB — CBC WITH DIFFERENTIAL/PLATELET
Abs Immature Granulocytes: 0.02 10*3/uL (ref 0.00–0.07)
Basophils Absolute: 0 10*3/uL (ref 0.0–0.1)
Basophils Relative: 1 %
Eosinophils Absolute: 0.1 10*3/uL (ref 0.0–0.5)
Eosinophils Relative: 2 %
HCT: 39 % (ref 36.0–46.0)
Hemoglobin: 13.1 g/dL (ref 12.0–15.0)
Immature Granulocytes: 0 %
Lymphocytes Relative: 23 %
Lymphs Abs: 1.3 10*3/uL (ref 0.7–4.0)
MCH: 30.4 pg (ref 26.0–34.0)
MCHC: 33.6 g/dL (ref 30.0–36.0)
MCV: 90.5 fL (ref 80.0–100.0)
Monocytes Absolute: 0.4 10*3/uL (ref 0.1–1.0)
Monocytes Relative: 6 %
Neutro Abs: 4 10*3/uL (ref 1.7–7.7)
Neutrophils Relative %: 68 %
Platelets: 196 10*3/uL (ref 150–400)
RBC: 4.31 MIL/uL (ref 3.87–5.11)
RDW: 13.1 % (ref 11.5–15.5)
WBC: 5.9 10*3/uL (ref 4.0–10.5)
nRBC: 0 % (ref 0.0–0.2)

## 2020-10-31 LAB — VITAMIN B12: Vitamin B-12: 260 pg/mL (ref 180–914)

## 2020-10-31 LAB — VITAMIN D 25 HYDROXY (VIT D DEFICIENCY, FRACTURES): Vit D, 25-Hydroxy: 64.67 ng/mL (ref 30–100)

## 2020-10-31 LAB — IRON AND TIBC
Iron: 48 ug/dL (ref 28–170)
Saturation Ratios: 19 % (ref 10.4–31.8)
TIBC: 248 ug/dL — ABNORMAL LOW (ref 250–450)
UIBC: 200 ug/dL

## 2020-10-31 LAB — FERRITIN: Ferritin: 218 ng/mL (ref 11–307)

## 2020-11-07 ENCOUNTER — Ambulatory Visit (HOSPITAL_COMMUNITY): Payer: Medicare HMO | Attending: Orthopedic Surgery | Admitting: Physical Therapy

## 2020-11-07 ENCOUNTER — Other Ambulatory Visit: Payer: Self-pay

## 2020-11-07 ENCOUNTER — Encounter (HOSPITAL_COMMUNITY): Payer: Self-pay | Admitting: Physical Therapy

## 2020-11-07 DIAGNOSIS — R2689 Other abnormalities of gait and mobility: Secondary | ICD-10-CM

## 2020-11-07 DIAGNOSIS — M6281 Muscle weakness (generalized): Secondary | ICD-10-CM | POA: Diagnosis not present

## 2020-11-07 DIAGNOSIS — R29898 Other symptoms and signs involving the musculoskeletal system: Secondary | ICD-10-CM

## 2020-11-07 DIAGNOSIS — M545 Low back pain, unspecified: Secondary | ICD-10-CM

## 2020-11-07 NOTE — Therapy (Signed)
[place], [place], [address] Phone: [phone]   Fax:  [phone]  Physical Therapy Evaluation  Patient Details  Name: [name] MRN: [id number] Date of Birth: 09-Sep-1950 Referring Provider (PT): Arther Abbott MD   Encounter Date: 11/07/2020   PT End of Session - 11/07/20 1215    Visit Number 1    Number of Visits 12    Date for PT Re-Evaluation 12/19/20    Authorization Type [employer] medicare (no auth, no visit limit)    Progress Note Due on Visit 10    PT Start Time 1132    PT Stop Time 1213    PT Time Calculation (min) 41 min    Activity Tolerance Patient limited by pain;Patient limited by fatigue    Behavior During Therapy [facility] for tasks assessed/performed           Past Medical History:  Diagnosis Date  . Arthritis   . B12 deficiency 07/09/2015  . Crohn disease ([place]) JULY 2011 FEDA   PERSISTENT TI ULCERS seen on CE, NO NSAID USE  . Endometriosis    HISTORY  LEADING TO HYSTERECTOMY  . Fibromyalgia   . Fibromyalgia 12/09/2013  . Gastric ulcer   . GERD (gastroesophageal reflux disease)   . Headache   . Helicobacter pylori gastritis    HISTORY  . History of hiatal hernia   . Hives   . Iron (Fe) deficiency anemia 2009   JAN 2012 NL HB & FERRITIN  . Obesity   . Osteoporosis 11/20/2014  . Plantar fasciitis     Past Surgical History:  Procedure Laterality Date  . ABDOMINAL HYSTERECTOMY    . CATARACT EXTRACTION W/PHACO Right 12/05/2015   Procedure: CATARACT EXTRACTION PHACO AND INTRAOCULAR LENS PLACEMENT (IOC);  Surgeon: Birder Robson, MD;  Location: [facility] ORS;  Service: Ophthalmology;  Laterality: Right;  [place] 00:59.9 AP% 21.9 CDE 13.10 fluid pack lot # [id number]  . CATARACT EXTRACTION W/PHACO Left 06/24/2016   Procedure: CATARACT EXTRACTION PHACO AND INTRAOCULAR LENS PLACEMENT LEFT EYE; CDE:  8.33;  Surgeon: Tonny Branch, MD;  Location: [facility];  Service: Ophthalmology;  Laterality: Left;  .  COLONOSCOPY    . ESOPHAGOGASTRODUODENOSCOPY  10/14/2007   Esophagus showed 2 cm pink tongue seen extending from the GE junction.  Biopsies obtained via cold forceps.  Otherwise the  esophagus was without erosions, mass, ulceration, or stricture / . Large hiatal hernia pouch with linear erosions, and two 3-6 mmulcers seen in the pouch.  Biopsies obtained via cold forceps to  evaluate for H. pylori gastritis or malignancy  . Ileocolonoscopy  10/14/2007   Multiple 1-3 mm terminal ileum ulcers biopsied  The ulcers seemed to only involve the last 5 cm of her terminal ileum, and 10 cm of her ileum was visualized.  Otherwise the colon  was without polyps, masses, diverticula, or AVMs.  She had a normal retroflexed view of the rectum  . OVARIAN CYST REMOVAL    . TOTAL ABDOMINAL HYSTERECTOMY W/ BILATERAL SALPINGOOPHORECTOMY    . UPPER GASTROINTESTINAL ENDOSCOPY  JULY 2011 DIARRHEA/FEDA    Bx-H.PYLORI GASTRITIS, NL DUODENUM    There were no vitals filed for this visit.    Subjective Assessment - 11/07/20 1136    Subjective Patient is a 70 y.o. female who presents to physical therapy with c/o chronic LBP with intermittent bilateral sciatica. She has arthritis in her hips and her low back. She works at [employer] which requires her to do  a lot of movement. She is not sure what makes it worse or better. She noticed problems going down her legs before going to MD. She has cramping sometimes down in her legs. She has swelling in legs. She states she has had back pain for at least 20 years. She has a lot of pain in low back/upper  buttock. Patient states her main goal is decrease pain and improve function.    Limitations House hold activities;Walking;Standing;Lifting    How long can you walk comfortably? 5 minutes    Patient Stated Goals decrease pain and improve function    Currently in Pain? No/denies   worst 10/10 yesterday with movement             [facility] PT Assessment - 11/07/20 0001       Assessment   Medical Diagnosis Chronic R LBP with r sciatica    Referring Provider (PT) Arther Abbott MD    Onset Date/Surgical Date 11/07/00    Next MD Visit May 1st      Precautions   Precautions None      Restrictions   Weight Bearing Restrictions No      Balance Screen   Has the patient fallen in the past 6 months No    Has the patient had a decrease in activity level because of a fear of falling?  No    Is the patient reluctant to leave their home because of a fear of falling?  No      Prior Function   Level of Independence Independent      Cognition   Overall Cognitive Status Within Functional Limits for tasks assessed      Observation/Other Assessments   Observations Ambulates without AD    Focus on Therapeutic Outcomes (FOTO)  45% function      Sensation   Light Touch Appears Intact      Posture/Postural Control   Posture/Postural Control Postural limitations    Postural Limitations Increased thoracic kyphosis      ROM / Strength   AROM / PROM / Strength AROM;Strength      AROM   Overall AROM Comments worst with extension, uncomfortable for all    AROM Assessment Site Lumbar    Lumbar Flexion 25% limited    Lumbar Extension 75% limited    Lumbar - Right Side Bend 50% limited    Lumbar - Left Side Bend 50% limited    Lumbar - Right Rotation 50% limited    Lumbar - Left Rotation 50% limited      Strength   Overall Strength Comments patient apprehensive with testing due to fear of pain, difficult to fully assess    Strength Assessment Site Hip;Knee;Ankle    Right/Left Hip Right;Left    Right Hip Flexion 3+/5    Left Hip Flexion 3+/5    Right/Left Knee Right;Left    Right Knee Flexion 4/5    Right Knee Extension 4/5    Left Knee Flexion 4/5    Left Knee Extension 4/5    Right/Left Ankle Right;Left    Right Ankle Dorsiflexion 5/5    Left Ankle Dorsiflexion 5/5      Special Tests   Other special tests Slump test negative bilateral, tension in  hamstrings      Ambulation/Gait   Ambulation/Gait Yes    Ambulation/Gait Assistance 6: Modified independent (Device/Increase time)    Ambulation Distance (Feet) 305 Feet    Assistive device None    Gait Pattern Trunk flexed  Ambulation Surface Level;Indoor    Gait velocity decreased    Gait Comments 2MWT                      Objective measurements completed on examination: See above findings.               PT Education - 11/07/20 1131    Education Details Patient educated on exam findings, POC, posture    Person(s) Educated Patient    Methods Explanation;Demonstration    Comprehension Verbalized understanding;Returned demonstration            PT Short Term Goals - 11/07/20 1220      PT SHORT TERM GOAL #1   Title Patient will be independent with HEP in order to improve functional outcomes.    Time 3    Period Weeks    Status New    Target Date 11/28/20      PT SHORT TERM GOAL #2   Title Patient will report at least 25% improvement in symptoms for improved quality of life.    Time 3    Period Weeks    Status New    Target Date 11/28/20             PT Long Term Goals - 11/07/20 1220      PT LONG TERM GOAL #1   Title Patient will report at least 75% improvement in symptoms for improved quality of life.    Time 6    Period Weeks    Status New    Target Date 12/19/20      PT LONG TERM GOAL #2   Title Patient will improve FOTO score by at least 15 points in order to indicate improved tolerance to activity.    Time 6    Period Weeks    Status New    Target Date 12/19/20      PT LONG TERM GOAL #3   Title Patient will be able to ambulate at least 400 feet in 2MWT in order to demonstrate improved gait speed for community ambulation.    Time 6    Period Weeks    Status New    Target Date 12/19/20      PT LONG TERM GOAL #4   Title Patient will demonstrate at least 25% improvment in lumbar ROM for increased ease to move trunk while at  work.    Time 6    Period Weeks    Status New    Target Date 12/19/20                  Plan - 11/07/20 1216    Clinical Impression Statement Patient is a 70 y.o. female who presents to physical therapy with c/o LBP with intermittent bilateral sciatica. She presents with pain limited deficits in low back strength, ROM, endurance, postural impairments, spinal mobility and functional mobility with ADL. She is having to modify and restrict ADL as indicated by FOTO score as well as subjective information and objective measures which is affecting overall participation. Patient will benefit from skilled physical therapy in order to improve function and reduce impairment.    Personal Factors and Comorbidities Age;Fitness;Behavior Pattern;Past/Current Experience;Comorbidity 3+;Time since onset of injury/illness/exacerbation    Comorbidities Arthritis, hx Back pain, BMI over 30    Examination-Activity Limitations Locomotion Level;Transfers;Bed Mobility;Squat;Stairs;Stand;Lift    Examination-Participation Restrictions Occupation;Cleaning;Community Activity;Shop;Volunteer;Yard Work    Stability/Clinical Decision Making Stable/Uncomplicated    Designer, jewellery Low  Rehab Potential Good    PT Frequency 2x / week    PT Duration 6 weeks    PT Treatment/Interventions ADLs/Self Care Home Management;Aquatic Therapy;Cryotherapy;Electrical Stimulation;Moist Heat;Iontophoresis 64m/ml Dexamethasone;Traction;Ultrasound;DME Instruction;Gait training;Stair training;Functional mobility training;Therapeutic activities;Therapeutic exercise;Balance training;Neuromuscular re-education;Patient/family education;Orthotic Fit/Training;Manual techniques;Scar mobilization;Passive range of motion;Energy conservation;Dry needling;Splinting;Taping;Spinal Manipulations;Joint Manipulations;Manual lymph drainage    PT Next Visit Plan begin lumbar mobility exercises with SKTC, LTR, begin core strengthening, patient  presentation consistant with lumbar stenosis    PT Home Exercise Plan patient needed to leave    Consulted and Agree with Plan of Care Patient           Patient will benefit from skilled therapeutic intervention in order to improve the following deficits and impairments:  Abnormal gait,Difficulty walking,Decreased endurance,Pain,Decreased activity tolerance,Decreased balance,Impaired flexibility,Improper body mechanics,Decreased mobility,Decreased strength,Postural dysfunction  Visit Diagnosis: Low back pain, unspecified back pain laterality, unspecified chronicity, unspecified whether sciatica present  Muscle weakness (generalized)  Other abnormalities of gait and mobility  Other symptoms and signs involving the musculoskeletal system     Problem List Patient Active Problem List   Diagnosis Date Noted  . Acute midline low back pain with right-sided sciatica 10/03/2020  . Bilateral sciatica 10/03/2020  . Closed fracture of proximal end of left fibula 10/20/17  12/02/2017  . Thyroid nodule 09/23/2017  . Edema of left lower extremity 08/25/2017  . [place] headache 09/06/2015  . Vision changes 09/06/2015  . Dizziness 09/06/2015  . New onset of headaches after age 674902/02/2016  . B12 deficiency 07/09/2015  . Acute bilateral lower abdominal pain 12/08/2014  . Osteoporosis 11/20/2014  . Chronic back pain 11/10/2014  . GERD (gastroesophageal reflux disease) 06/16/2014  . Impaired fasting glucose 03/29/2014  . Fibromyalgia 12/09/2013  . [place] 01/17/[place]  . Iron deficiency anemia 04/10/2010    1:18 PM, 11/07/20 AMearl LatinPT, DPT Physical Therap[facility] [address]Phone: [phone]  Fax:  [phone] Name: TIVYROSE HASHMANMRN: [id number] of Birth: [birth year]

## 2020-11-10 ENCOUNTER — Ambulatory Visit (HOSPITAL_COMMUNITY): Payer: Medicare HMO | Admitting: Physical Therapy

## 2020-11-10 ENCOUNTER — Other Ambulatory Visit: Payer: Self-pay

## 2020-11-10 ENCOUNTER — Encounter (HOSPITAL_COMMUNITY): Payer: Self-pay | Admitting: Physical Therapy

## 2020-11-10 DIAGNOSIS — R2689 Other abnormalities of gait and mobility: Secondary | ICD-10-CM

## 2020-11-10 DIAGNOSIS — M6281 Muscle weakness (generalized): Secondary | ICD-10-CM | POA: Diagnosis not present

## 2020-11-10 DIAGNOSIS — M545 Low back pain, unspecified: Secondary | ICD-10-CM | POA: Diagnosis not present

## 2020-11-10 DIAGNOSIS — R29898 Other symptoms and signs involving the musculoskeletal system: Secondary | ICD-10-CM

## 2020-11-10 NOTE — Therapy (Signed)
[place], [place], [address] Phone: [phone]   Fax:  [phone]  Physical Therapy Treatment  Patient Details  Name: [name] MRN: [id number] Date of Birth: 1950/12/27 Referring Provider (PT): Arther Abbott MD   Encounter Date: 11/10/2020   PT End of Session - 11/10/20 1436    Visit Number 2    Number of Visits 12    Date for PT Re-Evaluation 12/19/20    Authorization Type [employer] medicare (no auth, no visit limit)    Progress Note Due on Visit 10    PT Start Time 1400    PT Stop Time 1440    PT Time Calculation (min) 40 min    Activity Tolerance Patient limited by pain;Patient limited by fatigue    Behavior During Therapy [facility] for tasks assessed/performed           Past Medical History:  Diagnosis Date  . Arthritis   . B12 deficiency 07/09/2015  . Crohn disease ([place]) JULY 2011 FEDA   PERSISTENT TI ULCERS seen on CE, NO NSAID USE  . Endometriosis    HISTORY  LEADING TO HYSTERECTOMY  . Fibromyalgia   . Fibromyalgia 12/09/2013  . Gastric ulcer   . GERD (gastroesophageal reflux disease)   . Headache   . Helicobacter pylori gastritis    HISTORY  . History of hiatal hernia   . Hives   . Iron (Fe) deficiency anemia 2009   JAN 2012 NL HB & FERRITIN  . Obesity   . Osteoporosis 11/20/2014  . Plantar fasciitis     Past Surgical History:  Procedure Laterality Date  . ABDOMINAL HYSTERECTOMY    . CATARACT EXTRACTION W/PHACO Right 12/05/2015   Procedure: CATARACT EXTRACTION PHACO AND INTRAOCULAR LENS PLACEMENT (IOC);  Surgeon: Birder Robson, MD;  Location: [facility] ORS;  Service: Ophthalmology;  Laterality: Right;  [place] 00:59.9 AP% 21.9 CDE 13.10 fluid pack lot # [id number]  . CATARACT EXTRACTION W/PHACO Left 06/24/2016   Procedure: CATARACT EXTRACTION PHACO AND INTRAOCULAR LENS PLACEMENT LEFT EYE; CDE:  8.33;  Surgeon: Tonny Branch, MD;  Location: [facility];  Service: Ophthalmology;  Laterality: Left;  .  COLONOSCOPY    . ESOPHAGOGASTRODUODENOSCOPY  10/14/2007   Esophagus showed 2 cm pink tongue seen extending from the GE junction.  Biopsies obtained via cold forceps.  Otherwise the  esophagus was without erosions, mass, ulceration, or stricture / . Large hiatal hernia pouch with linear erosions, and two 3-6 mmulcers seen in the pouch.  Biopsies obtained via cold forceps to  evaluate for H. pylori gastritis or malignancy  . Ileocolonoscopy  10/14/2007   Multiple 1-3 mm terminal ileum ulcers biopsied  The ulcers seemed to only involve the last 5 cm of her terminal ileum, and 10 cm of her ileum was visualized.  Otherwise the colon  was without polyps, masses, diverticula, or AVMs.  She had a normal retroflexed view of the rectum  . OVARIAN CYST REMOVAL    . TOTAL ABDOMINAL HYSTERECTOMY W/ BILATERAL SALPINGOOPHORECTOMY    . UPPER GASTROINTESTINAL ENDOSCOPY  JULY 2011 DIARRHEA/FEDA    Bx-H.PYLORI GASTRITIS, NL DUODENUM    There were no vitals filed for this visit.   Subjective Assessment - 11/10/20 1403    Subjective Pt statest that she just got off of work so her pain is bad today.    Limitations House hold activities;Walking;Standing;Lifting    How long can you walk comfortably? 5 minutes    Patient Stated Goals decrease  pain and improve function    Currently in Pain? Yes    Pain Score 5     Pain Location Back    Pain Orientation Lower    Pain Descriptors / Indicators Aching    Pain Radiating Towards b lE to ankle    Pain Onset More than a month ago    Pain Frequency Intermittent    Aggravating Factors  WB    Pain Relieving Factors ice    Effect of Pain on Daily Activities limits                             [facility] Adult PT Treatment/Exercise - 11/10/20 0001      Exercises   Exercises Lumbar      Lumbar Exercises: Stretches   Lower Trunk Rotation 5 reps      Lumbar Exercises: Seated   Other Seated Lumbar Exercises hip adduction, good posture    Other Seated  Lumbar Exercises scapular retraction, abdominal contraction      Lumbar Exercises: Supine   Other Supine Lumbar Exercises glut set x 10   modified supine; head up 30 degrees   Other Supine Lumbar Exercises decompresison 1-5      Modalities   Modalities Moist Heat      Moist Heat Therapy   Number Minutes Moist Heat 10 Minutes    Moist Heat Location Lumbar Spine                    PT Short Term Goals - 11/10/20 1437      PT SHORT TERM GOAL #1   Title Patient will be independent with HEP in order to improve functional outcomes.    Time 3    Period Weeks    Status On-going    Target Date 11/28/20      PT SHORT TERM GOAL #2   Title Patient will report at least 25% improvement in symptoms for improved quality of life.    Time 3    Period Weeks    Status On-going    Target Date 11/28/20             PT Long Term Goals - 11/10/20 1437      PT LONG TERM GOAL #1   Title Patient will report at least 75% improvement in symptoms for improved quality of life.    Time 6    Period Weeks    Status On-going      PT LONG TERM GOAL #2   Title Patient will improve FOTO score by at least 15 points in order to indicate improved tolerance to activity.    Time 6    Period Weeks    Status On-going      PT LONG TERM GOAL #3   Title Patient will be able to ambulate at least 400 feet in 2MWT in order to demonstrate improved gait speed for community ambulation.    Time 6    Period Weeks    Status Achieved      PT LONG TERM GOAL #4   Title Patient will demonstrate at least 25% improvment in lumbar ROM for increased ease to move trunk while at work.    Time 6    Period Weeks    Status On-going                 Plan - 11/10/20 1444    Clinical Impression Statement Pt states that  she can not lie on her back.  Therapist modified supine exercises with head 30 degrees elevated, we will try and decrease this elevation over time.  Pt having difficulty with every exercise  given to her stating that it increases her pain.  Therapist explained that these are isometric so the pt is in control and is able to go as light or as tight as she desires, however it is her decreased core and LE strength as well as her posture that is impacting her pain and unless she strengthens she will not find much benefit.  PT verbalized understanding .    Personal Factors and Comorbidities Age;Fitness;Behavior Pattern;Past/Current Experience;Comorbidity 3+;Time since onset of injury/illness/exacerbation    Comorbidities Arthritis, hx Back pain, BMI over 30    Examination-Activity Limitations Locomotion Level;Transfers;Bed Mobility;Squat;Stairs;Stand;Lift    Examination-Participation Restrictions Occupation;Cleaning;Community Activity;Shop;Volunteer;Yard Work    Stability/Clinical Decision Making Stable/Uncomplicated    Rehab Potential Good    PT Frequency 2x / week    PT Duration 6 weeks    PT Treatment/Interventions ADLs/Self Care Home Management;Aquatic Therapy;Cryotherapy;Electrical Stimulation;Moist Heat;Iontophoresis 51m/ml Dexamethasone;Traction;Ultrasound;DME Instruction;Gait training;Stair training;Functional mobility training;Therapeutic activities;Therapeutic exercise;Balance training;Neuromuscular re-education;Patient/family education;Orthotic Fit/Training;Manual techniques;Scar mobilization;Passive range of motion;Energy conservation;Dry needling;Splinting;Taping;Spinal Manipulations;Joint Manipulations;Manual lymph drainage    PT Next Visit Plan begin lumbar mobility exercises with SKTC, , begin core strengthening, patient presentation consistant with lumbar stenosis    PT Home Exercise Plan sitting good posture, ab set, scapular retraction, hip adduction,decompression    Consulted and Agree with Plan of Care Patient           Patient will benefit from skilled therapeutic intervention in order to improve the following deficits and impairments:  Abnormal gait,Difficulty  walking,Decreased endurance,Pain,Decreased activity tolerance,Decreased balance,Impaired flexibility,Improper body mechanics,Decreased mobility,Decreased strength,Postural dysfunction  Visit Diagnosis: Low back pain, unspecified back pain laterality, unspecified chronicity, unspecified whether sciatica present  Muscle weakness (generalized)  Other abnormalities of gait and mobility  Other symptoms and signs involving the musculoskeletal system     Problem List Patient Active Problem List   Diagnosis Date Noted  . Acute midline low back pain with right-sided sciatica 10/03/2020  . Bilateral sciatica 10/03/2020  . Closed fracture of proximal end of left fibula 10/20/17  12/02/2017  . Thyroid nodule 09/23/2017  . Edema of left lower extremity 08/25/2017  . Thunderclap headache 09/06/2015  . Vision changes 09/06/2015  . Dizziness 09/06/2015  . New onset of headaches after age 754002/02/2016  . B12 deficiency 07/09/2015  . Acute bilateral lower abdominal pain 12/08/2014  . Osteoporosis 11/20/2014  . Chronic back pain 11/10/2014  . GERD (gastroesophageal reflux disease) 06/16/2014  . Impaired fasting glucose 03/29/2014  . Fibromyalgia 12/09/2013  . [place] 01/17/[place]  . Iron deficiency anemia 04/10/2010    CRayetta Humphrey PT CLT [phone]/15/2022, 2:49 PM  [address] [address]Phone: [phone]  Fax:  [phone] Name: Sara ALVIZOMRN: [id number] of Birth: [birth year]

## 2020-11-13 ENCOUNTER — Ambulatory Visit (HOSPITAL_COMMUNITY): Payer: Medicare HMO | Admitting: Physical Therapy

## 2020-11-13 ENCOUNTER — Other Ambulatory Visit: Payer: Self-pay

## 2020-11-13 DIAGNOSIS — R29898 Other symptoms and signs involving the musculoskeletal system: Secondary | ICD-10-CM | POA: Diagnosis not present

## 2020-11-13 DIAGNOSIS — M545 Low back pain, unspecified: Secondary | ICD-10-CM

## 2020-11-13 DIAGNOSIS — R2689 Other abnormalities of gait and mobility: Secondary | ICD-10-CM

## 2020-11-13 DIAGNOSIS — M6281 Muscle weakness (generalized): Secondary | ICD-10-CM | POA: Diagnosis not present

## 2020-11-13 NOTE — Therapy (Signed)
[place], [place], [address] Phone: [phone]   Fax:  [phone]  Physical Therapy Treatment  Patient Details  Name: [name] MRN: [id number] Date of Birth: 04/05/1951 Referring Provider (PT): Arther Abbott MD   Encounter Date: 11/13/2020   PT End of Session - 11/13/20 1038    Visit Number 3    Number of Visits 12    Date for PT Re-Evaluation 12/19/20    Authorization Type [employer] medicare (no auth, no visit limit)    Progress Note Due on Visit 10    PT Start Time 0918    PT Stop Time 0956    PT Time Calculation (min) 38 min    Activity Tolerance Patient limited by pain;Patient limited by fatigue    Behavior During Therapy [facility] for tasks assessed/performed           Past Medical History:  Diagnosis Date  . Arthritis   . B12 deficiency 07/09/2015  . Crohn disease ([place]) JULY 2011 FEDA   PERSISTENT TI ULCERS seen on CE, NO NSAID USE  . Endometriosis    HISTORY  LEADING TO HYSTERECTOMY  . Fibromyalgia   . Fibromyalgia 12/09/2013  . Gastric ulcer   . GERD (gastroesophageal reflux disease)   . Headache   . Helicobacter pylori gastritis    HISTORY  . History of hiatal hernia   . Hives   . Iron (Fe) deficiency anemia 2009   JAN 2012 NL HB & FERRITIN  . Obesity   . Osteoporosis 11/20/2014  . Plantar fasciitis     Past Surgical History:  Procedure Laterality Date  . ABDOMINAL HYSTERECTOMY    . CATARACT EXTRACTION W/PHACO Right 12/05/2015   Procedure: CATARACT EXTRACTION PHACO AND INTRAOCULAR LENS PLACEMENT (IOC);  Surgeon: Birder Robson, MD;  Location: [facility] ORS;  Service: Ophthalmology;  Laterality: Right;  [place] 00:59.9 AP% 21.9 CDE 13.10 fluid pack lot # [id number]  . CATARACT EXTRACTION W/PHACO Left 06/24/2016   Procedure: CATARACT EXTRACTION PHACO AND INTRAOCULAR LENS PLACEMENT LEFT EYE; CDE:  8.33;  Surgeon: Tonny Branch, MD;  Location: [facility];  Service: Ophthalmology;  Laterality: Left;  .  COLONOSCOPY    . ESOPHAGOGASTRODUODENOSCOPY  10/14/2007   Esophagus showed 2 cm pink tongue seen extending from the GE junction.  Biopsies obtained via cold forceps.  Otherwise the  esophagus was without erosions, mass, ulceration, or stricture / . Large hiatal hernia pouch with linear erosions, and two 3-6 mmulcers seen in the pouch.  Biopsies obtained via cold forceps to  evaluate for H. pylori gastritis or malignancy  . Ileocolonoscopy  10/14/2007   Multiple 1-3 mm terminal ileum ulcers biopsied  The ulcers seemed to only involve the last 5 cm of her terminal ileum, and 10 cm of her ileum was visualized.  Otherwise the colon  was without polyps, masses, diverticula, or AVMs.  She had a normal retroflexed view of the rectum  . OVARIAN CYST REMOVAL    . TOTAL ABDOMINAL HYSTERECTOMY W/ BILATERAL SALPINGOOPHORECTOMY    . UPPER GASTROINTESTINAL ENDOSCOPY  JULY 2011 DIARRHEA/FEDA    Bx-H.PYLORI GASTRITIS, NL DUODENUM    There were no vitals filed for this visit.   Subjective Assessment - 11/13/20 1014    Subjective pt states she feels a little better today as she has not worked in several days.    Currently in Pain? Yes    Pain Score 3     Pain Location Back    Pain  Orientation Lower    Pain Descriptors / Indicators Aching    Pain Radiating Towards Bil hips, lateral LE into ankles                             [facility] Adult PT Treatment/Exercise - 11/13/20 0001      Lumbar Exercises: Stretches   Lower Trunk Rotation 10 seconds;5 reps      Lumbar Exercises: Seated   Other Seated Lumbar Exercises hip adduction and marching, good posture    Other Seated Lumbar Exercises scapular retraction, abdominal contraction      Lumbar Exercises: Supine   Ab Set 10 reps    Clam 10 reps    Other Supine Lumbar Exercises glut set x 10    Other Supine Lumbar Exercises decompresison 1-5      Modalities   Modalities Moist Heat   completed while seated     Moist Heat Therapy   Number  Minutes Moist Heat 10 Minutes    Moist Heat Location Lumbar Spine                    PT Short Term Goals - 11/10/20 1437      PT SHORT TERM GOAL #1   Title Patient will be independent with HEP in order to improve functional outcomes.    Time 3    Period Weeks    Status On-going    Target Date 11/28/20      PT SHORT TERM GOAL #2   Title Patient will report at least 25% improvement in symptoms for improved quality of life.    Time 3    Period Weeks    Status On-going    Target Date 11/28/20             PT Long Term Goals - 11/10/20 1437      PT LONG TERM GOAL #1   Title Patient will report at least 75% improvement in symptoms for improved quality of life.    Time 6    Period Weeks    Status On-going      PT LONG TERM GOAL #2   Title Patient will improve FOTO score by at least 15 points in order to indicate improved tolerance to activity.    Time 6    Period Weeks    Status On-going      PT LONG TERM GOAL #3   Title Patient will be able to ambulate at least 400 feet in 2MWT in order to demonstrate improved gait speed for community ambulation.    Time 6    Period Weeks    Status Achieved      PT LONG TERM GOAL #4   Title Patient will demonstrate at least 25% improvment in lumbar ROM for increased ease to move trunk while at work.    Time 6    Period Weeks    Status On-going                 Plan - 11/13/20 1106    Clinical Impression Statement Began with decompression exercises supine with UE up 30 degrees.  Progressed on with isometric glutes and abdominal mm and stretches.  Moist head applied in seated position with postural and core exercises completed.  Pt with cues for maintaining upright and completing therex slowly and controlled. Pt without change of symptoms at end of session.    Personal Factors and Comorbidities Age;Fitness;Behavior Pattern;Past/Current Experience;Comorbidity  3+;Time since onset of injury/illness/exacerbation     Comorbidities Arthritis, hx Back pain, BMI over 30    Examination-Activity Limitations Locomotion Level;Transfers;Bed Mobility;Squat;Stairs;Stand;Lift    Examination-Participation Restrictions Occupation;Cleaning;Community Activity;Shop;Volunteer;Yard Work    Stability/Clinical Decision Making Stable/Uncomplicated    Rehab Potential Good    PT Frequency 2x / week    PT Duration 6 weeks    PT Treatment/Interventions ADLs/Self Care Home Management;Aquatic Therapy;Cryotherapy;Electrical Stimulation;Moist Heat;Iontophoresis 86m/ml Dexamethasone;Traction;Ultrasound;DME Instruction;Gait training;Stair training;Functional mobility training;Therapeutic activities;Therapeutic exercise;Balance training;Neuromuscular re-education;Patient/family education;Orthotic Fit/Training;Manual techniques;Scar mobilization;Passive range of motion;Energy conservation;Dry needling;Splinting;Taping;Spinal Manipulations;Joint Manipulations;Manual lymph drainage    PT Next Visit Plan progress lumbar mobility and core strength.  [facility].    PT Home Exercise Plan sitting good posture, ab set, scapular retraction, hip adduction,decompression    Consulted and Agree with Plan of Care Patient           Patient will benefit from skilled therapeutic intervention in order to improve the following deficits and impairments:  Abnormal gait,Difficulty walking,Decreased endurance,Pain,Decreased activity tolerance,Decreased balance,Impaired flexibility,Improper body mechanics,Decreased mobility,Decreased strength,Postural dysfunction  Visit Diagnosis: Low back pain, unspecified back pain laterality, unspecified chronicity, unspecified whether sciatica present  Muscle weakness (generalized)  Other abnormalities of gait and mobility  Other symptoms and signs involving the musculoskeletal system     Problem List Patient Active Problem List   Diagnosis Date Noted  . Acute midline low back  pain with right-sided sciatica 10/03/2020  . Bilateral sciatica 10/03/2020  . Closed fracture of proximal end of left fibula 10/20/17  12/02/2017  . Thyroid nodule 09/23/2017  . Edema of left lower extremity 08/25/2017  . Thunderclap headache 09/06/2015  . Vision changes 09/06/2015  . Dizziness 09/06/2015  . New onset of headaches after age 414702/02/2016  . B12 deficiency 07/09/2015  . Acute bilateral lower abdominal pain 12/08/2014  . Osteoporosis 11/20/2014  . Chronic back pain 11/10/2014  . GERD (gastroesophageal reflux disease) 06/16/2014  . Impaired fasting glucose 03/29/2014  . Fibromyalgia 12/09/2013  . [place] 01/17/2011  . Iron deficiency anemia 04/10/2010   ATeena Irani PTA/CLT [phone] FTeena Irani4/18/2022, 11:08 AM  [address] StreetSRock[place] [address]Phone: [phone]  Fax:  [phone] Name: Sara SWAGGERTYMRN: [id number] of Birth: 503-Aug-1952

## 2020-11-14 ENCOUNTER — Other Ambulatory Visit (HOSPITAL_COMMUNITY): Payer: Medicare Other

## 2020-11-16 ENCOUNTER — Encounter (HOSPITAL_COMMUNITY): Payer: Self-pay | Admitting: Physical Therapy

## 2020-11-16 ENCOUNTER — Ambulatory Visit (HOSPITAL_COMMUNITY): Payer: Medicare HMO | Admitting: Physical Therapy

## 2020-11-16 ENCOUNTER — Other Ambulatory Visit: Payer: Self-pay

## 2020-11-16 DIAGNOSIS — R2689 Other abnormalities of gait and mobility: Secondary | ICD-10-CM

## 2020-11-16 DIAGNOSIS — M6281 Muscle weakness (generalized): Secondary | ICD-10-CM

## 2020-11-16 DIAGNOSIS — M545 Low back pain, unspecified: Secondary | ICD-10-CM | POA: Diagnosis not present

## 2020-11-16 DIAGNOSIS — R29898 Other symptoms and signs involving the musculoskeletal system: Secondary | ICD-10-CM | POA: Diagnosis not present

## 2020-11-16 NOTE — Therapy (Signed)
[place], [place], [address] Phone: [phone]   Fax:  [phone]  Physical Therapy Treatment  Patient Details  Name: [name] MRN: [id number] Date of Birth: 10-12-1950 Referring Provider (PT): Arther Abbott MD   Encounter Date: 11/16/2020   PT End of Session - 11/16/20 1521    Visit Number 4    Number of Visits 12    Date for PT Re-Evaluation 12/19/20    Authorization Type [employer] medicare (no auth, no visit limit)    Progress Note Due on Visit 10    PT Start Time 1525    PT Stop Time 1605    PT Time Calculation (min) 40 min    Activity Tolerance Patient limited by pain;Patient limited by fatigue    Behavior During Therapy [facility] for tasks assessed/performed           Past Medical History:  Diagnosis Date  . Arthritis   . B12 deficiency 07/09/2015  . Crohn disease ([place]) JULY 2011 FEDA   PERSISTENT TI ULCERS seen on CE, NO NSAID USE  . Endometriosis    HISTORY  LEADING TO HYSTERECTOMY  . Fibromyalgia   . Fibromyalgia 12/09/2013  . Gastric ulcer   . GERD (gastroesophageal reflux disease)   . Headache   . Helicobacter pylori gastritis    HISTORY  . History of hiatal hernia   . Hives   . Iron (Fe) deficiency anemia 2009   JAN 2012 NL HB & FERRITIN  . Obesity   . Osteoporosis 11/20/2014  . Plantar fasciitis     Past Surgical History:  Procedure Laterality Date  . ABDOMINAL HYSTERECTOMY    . CATARACT EXTRACTION W/PHACO Right 12/05/2015   Procedure: CATARACT EXTRACTION PHACO AND INTRAOCULAR LENS PLACEMENT (IOC);  Surgeon: Birder Robson, MD;  Location: [facility] ORS;  Service: Ophthalmology;  Laterality: Right;  [place] 00:59.9 AP% 21.9 CDE 13.10 fluid pack lot # [id number]  . CATARACT EXTRACTION W/PHACO Left 06/24/2016   Procedure: CATARACT EXTRACTION PHACO AND INTRAOCULAR LENS PLACEMENT LEFT EYE; CDE:  8.33;  Surgeon: Tonny Branch, MD;  Location: [facility];  Service: Ophthalmology;  Laterality: Left;  .  COLONOSCOPY    . ESOPHAGOGASTRODUODENOSCOPY  10/14/2007   Esophagus showed 2 cm pink tongue seen extending from the GE junction.  Biopsies obtained via cold forceps.  Otherwise the  esophagus was without erosions, mass, ulceration, or stricture / . Large hiatal hernia pouch with linear erosions, and two 3-6 mmulcers seen in the pouch.  Biopsies obtained via cold forceps to  evaluate for H. pylori gastritis or malignancy  . Ileocolonoscopy  10/14/2007   Multiple 1-3 mm terminal ileum ulcers biopsied  The ulcers seemed to only involve the last 5 cm of her terminal ileum, and 10 cm of her ileum was visualized.  Otherwise the colon  was without polyps, masses, diverticula, or AVMs.  She had a normal retroflexed view of the rectum  . OVARIAN CYST REMOVAL    . TOTAL ABDOMINAL HYSTERECTOMY W/ BILATERAL SALPINGOOPHORECTOMY    . UPPER GASTROINTESTINAL ENDOSCOPY  JULY 2011 DIARRHEA/FEDA    Bx-H.PYLORI GASTRITIS, NL DUODENUM    There were no vitals filed for this visit.   Subjective Assessment - 11/16/20 1524    Subjective States that she is in discomfort not pain but feels bad all over as she just got off of work. States her right leg is really bothering her.    Currently in Pain? --   unable to give  numerical number             [facility] PT Assessment - 11/16/20 0001      Assessment   Medical Diagnosis Chronic R LBP with r sciatica    Referring Provider (PT) Arther Abbott MD    Onset Date/Surgical Date 11/07/00                         [facility] Adult PT Treatment/Exercise - 11/16/20 0001      Lumbar Exercises: Stretches   Lower Trunk Rotation --   2x10 5" holds Bilateral - in lawn chair position     Lumbar Exercises: Seated   Other Seated Lumbar Exercises self mobilization with tennis ball - seated as standing was painful - 8 minutes; then with percussion gun - 3 minutes    Other Seated Lumbar Exercises hip abd isometric x20 5" holds; tiraled piriformis stretch (at shin level  with PT assist ) - too painful to perform      Lumbar Exercises: Supine   Other Supine Lumbar Exercises bent knee fall outs then hip add isometric 2x10 5" holds      Manual Therapy   Manual Therapy Soft tissue mobilization    Manual therapy comments all manual interventions performed independently of other interventions    Soft tissue mobilization IASTM to lumbar paraspinals with percussion gun with large ball attachement.                  PT Education - 11/16/20 1629    Education Details on anatomy, on use of percussion gun, on rationale for exercises    Person(s) Educated Patient    Methods Explanation    Comprehension Verbalized understanding            PT Short Term Goals - 11/10/20 1437      PT SHORT TERM GOAL #1   Title Patient will be independent with HEP in order to improve functional outcomes.    Time 3    Period Weeks    Status On-going    Target Date 11/28/20      PT SHORT TERM GOAL #2   Title Patient will report at least 25% improvement in symptoms for improved quality of life.    Time 3    Period Weeks    Status On-going    Target Date 11/28/20             PT Long Term Goals - 11/10/20 1437      PT LONG TERM GOAL #1   Title Patient will report at least 75% improvement in symptoms for improved quality of life.    Time 6    Period Weeks    Status On-going      PT LONG TERM GOAL #2   Title Patient will improve FOTO score by at least 15 points in order to indicate improved tolerance to activity.    Time 6    Period Weeks    Status On-going      PT LONG TERM GOAL #3   Title Patient will be able to ambulate at least 400 feet in 2MWT in order to demonstrate improved gait speed for community ambulation.    Time 6    Period Weeks    Status Achieved      PT LONG TERM GOAL #4   Title Patient will demonstrate at least 25% improvment in lumbar ROM for increased ease to move trunk while at work.  Time 6    Period Weeks    Status On-going                  Plan - 11/16/20 1629    Clinical Impression Statement Patient with difficulty reporting overall symptoms and response to intervention but did seem to feel that percussion gun was helpful. Continued hip/leg symptoms noted, trialed piriformis stretch with leg low on shin but patient not tolerant of movement or exercise. Transitioned to isometric hip abd which was tolerated well. Educated patient on possible benefits of self mobilization with tennis ball/percussion gun. Improved gait noted end of session and overall less symptoms noted end of session.    Personal Factors and Comorbidities Age;Fitness;Behavior Pattern;Past/Current Experience;Comorbidity 3+;Time since onset of injury/illness/exacerbation    Comorbidities Arthritis, hx Back pain, BMI over 30    Examination-Activity Limitations Locomotion Level;Transfers;Bed Mobility;Squat;Stairs;Stand;Lift    Examination-Participation Restrictions Occupation;Cleaning;Community Activity;Shop;Volunteer;Yard Work    Stability/Clinical Decision Making Stable/Uncomplicated    Rehab Potential Good    PT Frequency 2x / week    PT Duration 6 weeks    PT Treatment/Interventions ADLs/Self Care Home Management;Aquatic Therapy;Cryotherapy;Electrical Stimulation;Moist Heat;Iontophoresis 61m/ml Dexamethasone;Traction;Ultrasound;DME Instruction;Gait training;Stair training;Functional mobility training;Therapeutic activities;Therapeutic exercise;Balance training;Neuromuscular re-education;Patient/family education;Orthotic Fit/Training;Manual techniques;Scar mobilization;Passive range of motion;Energy conservation;Dry needling;Splinting;Taping;Spinal Manipulations;Joint Manipulations;Manual lymph drainage    PT Next Visit Plan progress lumbar mobility and core strength.  hip mobility as tolerated    PT Home Exercise Plan sitting good posture, ab set, scapular retraction, hip adduction,decompression; 4/21, self mobilization    Consulted and Agree with  Plan of Care Patient           Patient will benefit from skilled therapeutic intervention in order to improve the following deficits and impairments:  Abnormal gait,Difficulty walking,Decreased endurance,Pain,Decreased activity tolerance,Decreased balance,Impaired flexibility,Improper body mechanics,Decreased mobility,Decreased strength,Postural dysfunction  Visit Diagnosis: Low back pain, unspecified back pain laterality, unspecified chronicity, unspecified whether sciatica present  Muscle weakness (generalized)  Other abnormalities of gait and mobility  Other symptoms and signs involving the musculoskeletal system     Problem List Patient Active Problem List   Diagnosis Date Noted  . Acute midline low back pain with right-sided sciatica 10/03/2020  . Bilateral sciatica 10/03/2020  . Closed fracture of proximal end of left fibula 10/20/17  12/02/2017  . Thyroid nodule 09/23/2017  . Edema of left lower extremity 08/25/2017  . Thunderclap headache 09/06/2015  . Vision changes 09/06/2015  . Dizziness 09/06/2015  . New onset of headaches after age 67402/02/2016  . B12 deficiency 07/09/2015  . Acute bilateral lower abdominal pain 12/08/2014  . Osteoporosis 11/20/2014  . Chronic back pain 11/10/2014  . GERD (gastroesophageal reflux disease) 06/16/2014  . Impaired fasting glucose 03/29/2014  . Fibromyalgia 12/09/2013  . Cro[place]'s ileitis ([place] 01/17/2011  . Iron deficiency anemia 04/10/2010   4:30 PM, 11/16/20 MJerene Pitch DPT [facility] [phone]  [address] [address]Phone: [phone]  Fax:  [phone] Name: TZYKERA ABELLAMRN: [id number] of Birth: 509-23-1952

## 2020-11-21 ENCOUNTER — Telehealth (HOSPITAL_COMMUNITY): Payer: Self-pay | Admitting: Physical Therapy

## 2020-11-21 ENCOUNTER — Encounter (HOSPITAL_COMMUNITY): Payer: Self-pay | Admitting: Physical Therapy

## 2020-11-21 ENCOUNTER — Other Ambulatory Visit: Payer: Self-pay

## 2020-11-21 ENCOUNTER — Ambulatory Visit (HOSPITAL_COMMUNITY): Payer: Medicare HMO | Admitting: Physical Therapy

## 2020-11-21 DIAGNOSIS — R29898 Other symptoms and signs involving the musculoskeletal system: Secondary | ICD-10-CM

## 2020-11-21 DIAGNOSIS — R2689 Other abnormalities of gait and mobility: Secondary | ICD-10-CM | POA: Diagnosis not present

## 2020-11-21 DIAGNOSIS — M545 Low back pain, unspecified: Secondary | ICD-10-CM

## 2020-11-21 DIAGNOSIS — M6281 Muscle weakness (generalized): Secondary | ICD-10-CM

## 2020-11-21 NOTE — Patient Instructions (Signed)
Access Code: [email or web address] URL: [email or web address] Date: 11/21/2020 Prepared by: Mitzi Hansen Samanyu Tinnell  Exercises Hooklying Single Knee to Chest Stretch - 1 x daily - 7 x weekly - 3 reps - 20 second hold Seated March - 1 x daily - 7 x weekly - 10 reps - 5 second hold

## 2020-11-21 NOTE — Therapy (Signed)
[place], [place], [address] Phone: [phone]   Fax:  [phone]  Physical Therapy Treatment  Patient Details  Name: [name] MRN: [id number] Date of Birth: [birth year] Referring Provider (PT): Arther Abbott MD   Encounter Date: 11/21/2020   PT End of Session - 11/21/20 1313    Visit Number 5    Number of Visits 12    Date for PT Re-Evaluation 12/19/20    Authorization Type [employer] medicare (no auth, no visit limit)    Progress Note Due on Visit 10    PT Start Time 1315    PT Stop Time 1354    PT Time Calculation (min) 39 min    Activity Tolerance Patient limited by pain;Patient limited by fatigue    Behavior During Therapy [facility] for tasks assessed/performed           Past Medical History:  Diagnosis Date  . Arthritis   . B12 deficiency 07/09/2015  . Crohn disease ([place]) JULY 2011 FEDA   PERSISTENT TI ULCERS seen on CE, NO NSAID USE  . Endometriosis    HISTORY  LEADING TO HYSTERECTOMY  . Fibromyalgia   . Fibromyalgia 12/09/2013  . Gastric ulcer   . GERD (gastroesophageal reflux disease)   . Headache   . Helicobacter pylori gastritis    HISTORY  . History of hiatal hernia   . Hives   . Iron (Fe) deficiency anemia 2009   JAN 2012 NL HB & FERRITIN  . Obesity   . Osteoporosis 11/20/2014  . Plantar fasciitis     Past Surgical History:  Procedure Laterality Date  . ABDOMINAL HYSTERECTOMY    . CATARACT EXTRACTION W/PHACO Right 12/05/2015   Procedure: CATARACT EXTRACTION PHACO AND INTRAOCULAR LENS PLACEMENT (IOC);  Surgeon: Birder Robson, MD;  Location: [facility] ORS;  Service: Ophthalmology;  Laterality: Right;  [place] 00:59.9 AP% 21.9 CDE 13.10 fluid pack lot # [id number]  . CATARACT EXTRACTION W/PHACO Left 06/24/2016   Procedure: CATARACT EXTRACTION PHACO AND INTRAOCULAR LENS PLACEMENT LEFT EYE; CDE:  8.33;  Surgeon: Tonny Branch, MD;  Location: [facility];  Service: Ophthalmology;  Laterality: Left;  .  COLONOSCOPY    . ESOPHAGOGASTRODUODENOSCOPY  10/14/2007   Esophagus showed 2 cm pink tongue seen extending from the GE junction.  Biopsies obtained via cold forceps.  Otherwise the  esophagus was without erosions, mass, ulceration, or stricture / . Large hiatal hernia pouch with linear erosions, and two 3-6 mmulcers seen in the pouch.  Biopsies obtained via cold forceps to  evaluate for H. pylori gastritis or malignancy  . Ileocolonoscopy  10/14/2007   Multiple 1-3 mm terminal ileum ulcers biopsied  The ulcers seemed to only involve the last 5 cm of her terminal ileum, and 10 cm of her ileum was visualized.  Otherwise the colon  was without polyps, masses, diverticula, or AVMs.  She had a normal retroflexed view of the rectum  . OVARIAN CYST REMOVAL    . TOTAL ABDOMINAL HYSTERECTOMY W/ BILATERAL SALPINGOOPHORECTOMY    . UPPER GASTROINTESTINAL ENDOSCOPY  JULY 2011 DIARRHEA/FEDA    Bx-H.PYLORI GASTRITIS, NL DUODENUM    There were no vitals filed for this visit.   Subjective Assessment - 11/21/20 1314    Subjective She has had a lot of pain with walking. She doesn't notice a whole lot of difference. She continues to have a lot pain in back and legs with walking and standing. She has been trying to sit with better  posture.    Currently in Pain? No/denies                             [facility] Adult PT Treatment/Exercise - 11/21/20 0001      Lumbar Exercises: Stretches   Single Knee to Chest Stretch 20 seconds;3 reps;Right;Left    Lower Trunk Rotation Limitations 10 reps 5 second holds bilateral      Lumbar Exercises: Standing   Other Standing Lumbar Exercises hip abduction 1x 10 bilateral      Lumbar Exercises: Seated   Other Seated Lumbar Exercises lumbar flexion stretch with ball 10x 10 second holds    Other Seated Lumbar Exercises marching 10x 5 second holds                  PT Education - 11/21/20 1314    Education Details HEP, exercise mechanics    Person(s)  Educated Patient    Methods Explanation;Demonstration    Comprehension Verbalized understanding;Returned demonstration            PT Short Term Goals - 11/10/20 1437      PT SHORT TERM GOAL #1   Title Patient will be independent with HEP in order to improve functional outcomes.    Time 3    Period Weeks    Status On-going    Target Date 11/28/20      PT SHORT TERM GOAL #2   Title Patient will report at least 25% improvement in symptoms for improved quality of life.    Time 3    Period Weeks    Status On-going    Target Date 11/28/20             PT Long Term Goals - 11/10/20 1437      PT LONG TERM GOAL #1   Title Patient will report at least 75% improvement in symptoms for improved quality of life.    Time 6    Period Weeks    Status On-going      PT LONG TERM GOAL #2   Title Patient will improve FOTO score by at least 15 points in order to indicate improved tolerance to activity.    Time 6    Period Weeks    Status On-going      PT LONG TERM GOAL #3   Title Patient will be able to ambulate at least 400 feet in 2MWT in order to demonstrate improved gait speed for community ambulation.    Time 6    Period Weeks    Status Achieved      PT LONG TERM GOAL #4   Title Patient will demonstrate at least 25% improvment in lumbar ROM for increased ease to move trunk while at work.    Time 6    Period Weeks    Status On-going                 Plan - 11/21/20 1313    Clinical Impression Statement Attempted SKTC stretch in supine but patient unable to lay flat on table due to difficulty breathing. She is able to complete from inclined plinth with use of towel. Continued with core and hip strengthening today.  Patient fatigues quickly with exercises and requires several rest breaks throughout session. Patient will continue to benefit from skilled physical therapy in order to reduce impairment and improve function.    Personal Factors and Comorbidities  Age;Fitness;Behavior Pattern;Past/Current Experience;Comorbidity 3+;Time since onset of  injury/illness/exacerbation    Comorbidities Arthritis, hx Back pain, BMI over 30    Examination-Activity Limitations Locomotion Level;Transfers;Bed Mobility;Squat;Stairs;Stand;Lift    Examination-Participation Restrictions Occupation;Cleaning;Community Activity;Shop;Volunteer;Yard Work    Stability/Clinical Decision Making Stable/Uncomplicated    Rehab Potential Good    PT Frequency 2x / week    PT Duration 6 weeks    PT Treatment/Interventions ADLs/Self Care Home Management;Aquatic Therapy;Cryotherapy;Electrical Stimulation;Moist Heat;Iontophoresis 41m/ml Dexamethasone;Traction;Ultrasound;DME Instruction;Gait training;Stair training;Functional mobility training;Therapeutic activities;Therapeutic exercise;Balance training;Neuromuscular re-education;Patient/family education;Orthotic Fit/Training;Manual techniques;Scar mobilization;Passive range of motion;Energy conservation;Dry needling;Splinting;Taping;Spinal Manipulations;Joint Manipulations;Manual lymph drainage    PT Next Visit Plan progress lumbar mobility and core strength.  hip mobility as tolerated    PT Home Exercise Plan sitting good posture, ab set, scapular retraction, hip adduction,decompression; 4/21, self mobilization    Consulted and Agree with Plan of Care Patient           Patient will benefit from skilled therapeutic intervention in order to improve the following deficits and impairments:  Abnormal gait,Difficulty walking,Decreased endurance,Pain,Decreased activity tolerance,Decreased balance,Impaired flexibility,Improper body mechanics,Decreased mobility,Decreased strength,Postural dysfunction  Visit Diagnosis: Low back pain, unspecified back pain laterality, unspecified chronicity, unspecified whether sciatica present  Muscle weakness (generalized)  Other abnormalities of gait and mobility  Other symptoms and signs involving the  musculoskeletal system     Problem List Patient Active Problem List   Diagnosis Date Noted  . Acute midline low back pain with right-sided sciatica 10/03/2020  . Bilateral sciatica 10/03/2020  . Closed fracture of proximal end of left fibula 10/20/17  12/02/2017  . Thyroid nodule 09/23/2017  . Edema of left lower extremity 08/25/2017  . Thunderclap headache 09/06/2015  . Vision changes 09/06/2015  . Dizziness 09/06/2015  . New onset of headaches after age 89602/02/2016  . B12 deficiency 07/09/2015  . Acute bilateral lower abdominal pain 12/08/2014  . Osteoporosis 11/20/2014  . Chronic back pain 11/10/2014  . GERD (gastroesophageal reflux disease) 06/16/2014  . Impaired fasting glucose 03/29/2014  . Fibromyalgia 12/09/2013  . Cro[place]'s [place]itis ([place] 01/17/2011  . Iron deficiency anemia 04/10/2010    1:57 PM, 11/21/20 AMearl LatinPT, DPT Physical Therap[place] [id number]: [phone]  Fax:  [phone] Name: TMAKALIA BAREMRN: [id number] of Birth: [birth year]

## 2020-11-21 NOTE — Telephone Encounter (Signed)
Pt will get her iron shot and will call us back to r/s this apptment at a later date.

## 2020-11-22 ENCOUNTER — Ambulatory Visit (HOSPITAL_COMMUNITY): Payer: Medicare HMO | Admitting: Physical Therapy

## 2020-11-23 ENCOUNTER — Inpatient Hospital Stay (HOSPITAL_BASED_OUTPATIENT_CLINIC_OR_DEPARTMENT_OTHER): Payer: Medicare HMO | Admitting: Hematology

## 2020-11-23 ENCOUNTER — Other Ambulatory Visit: Payer: Self-pay

## 2020-11-23 VITALS — BP 145/85 | HR 96 | Temp 97.0°F | Resp 20 | Wt 222.2 lb

## 2020-11-23 DIAGNOSIS — D500 Iron deficiency anemia secondary to blood loss (chronic): Secondary | ICD-10-CM

## 2020-11-23 DIAGNOSIS — D509 Iron deficiency anemia, unspecified: Secondary | ICD-10-CM | POA: Diagnosis not present

## 2020-11-23 NOTE — Progress Notes (Signed)
[facility] [place], [place] [address]   CLINIC:  Medical Oncology/Hematology  PCP:  Kathyrn Drown, [place] / [place] [address]  [phone]  REASON FOR VISIT:  Follow-up for IDA  PRIOR THERAPY: None  CURRENT THERAPY: Intermittent Feraheme last on 08/30/2020  INTERVAL HISTORY:  [name], a 70 y.o. female, returns for routine follow-up for her IDA. [name] was last seen on 08/09/2020.  Today she reports feeling okay. She denies having melena, hematochezia or hematuria. She complains of not having any energy but denies having pica; she has no energy to do anything and lies in bed when she gets home. She continues taking vitamin D 50,000 units weekly. She continues having low-grade Crohn's.   REVIEW OF SYSTEMS:  Review of Systems  Constitutional: Positive for fatigue (depleted). Negative for appetite change.  All other systems reviewed and are negative.   PAST MEDICAL/SURGICAL HISTORY:  Past Medical History:  Diagnosis Date  . Arthritis   . B12 deficiency 07/09/2015  . Crohn disease ([place]) JULY 2011 FEDA   PERSISTENT TI ULCERS seen on CE, NO NSAID USE  . Endometriosis    HISTORY  LEADING TO HYSTERECTOMY  . Fibromyalgia   . Fibromyalgia 12/09/2013  . Gastric ulcer   . GERD (gastroesophageal reflux disease)   . Headache   . Helicobacter pylori gastritis    HISTORY  . History of hiatal hernia   . Hives   . Iron (Fe) deficiency anemia 2009   JAN 2012 NL HB & FERRITIN  . Obesity   . Osteoporosis 11/20/2014  . Plantar fasciitis    Past Surgical History:  Procedure Laterality Date  . ABDOMINAL HYSTERECTOMY    . CATARACT EXTRACTION W/PHACO Right 12/05/2015   Procedure: CATARACT EXTRACTION PHACO AND INTRAOCULAR LENS PLACEMENT (IOC);  Surgeon: Birder Robson, MD;  Location: [facility] ORS;  Service: Ophthalmology;  Laterality: Right;  [place] 00:59.9 AP% 21.9 CDE 13.10 fluid pack lot # [id number]  . CATARACT EXTRACTION  W/PHACO Left 06/24/2016   Procedure: CATARACT EXTRACTION PHACO AND INTRAOCULAR LENS PLACEMENT LEFT EYE; CDE:  8.33;  Surgeon: Tonny Branch, MD;  Location: [facility];  Service: Ophthalmology;  Laterality: Left;  . COLONOSCOPY    . ESOPHAGOGASTRODUODENOSCOPY  10/14/2007   Esophagus showed 2 cm pink tongue seen extending from the GE junction.  Biopsies obtained via cold forceps.  Otherwise the  esophagus was without erosions, mass, ulceration, or stricture / . Large hiatal hernia pouch with linear erosions, and two 3-6 mmulcers seen in the pouch.  Biopsies obtained via cold forceps to  evaluate for H. pylori gastritis or malignancy  . Ileocolonoscopy  10/14/2007   Multiple 1-3 mm terminal ileum ulcers biopsied  The ulcers seemed to only involve the last 5 cm of her terminal ileum, and 10 cm of her ileum was visualized.  Otherwise the colon  was without polyps, masses, diverticula, or AVMs.  She had a normal retroflexed view of the rectum  . OVARIAN CYST REMOVAL    . TOTAL ABDOMINAL HYSTERECTOMY W/ BILATERAL SALPINGOOPHORECTOMY    . UPPER GASTROINTESTINAL ENDOSCOPY  JULY 2011 DIARRHEA/FEDA    Bx-H.PYLORI GASTRITIS, NL DUODENUM    SOCIAL HISTORY:  Social History   Socioeconomic History  . Marital status: Married    Spouse name: [name]   . Number of children: 3  . Years of education: 24  . Highest education level: Not on file  Occupational History  . Occupation: Retired  Tobacco  Use  . Smoking status: Never Smoker  . Smokeless tobacco: Never Used  Vaping Use  . Vaping Use: Never used  Substance and Sexual Activity  . Alcohol use: No  . Drug use: No  . Sexual activity: Yes    Birth control/protection: Surgical  Other Topics Concern  . Not on file  Social History Narrative   Lives w/ husband   Caffeine use: Drinks 1-2 cups coffee per day   Social Determinants of Health   Financial Resource Strain: Not on file  Food Insecurity: Not on file  Transportation Needs: Not on file  Physical  Activity: Not on file  Stress: Not on file  Social Connections: Not on file  Intimate Partner Violence: Not At Risk  . Fear of Current or Ex-Partner: No  . Emotionally Abused: No  . Physically Abused: No  . Sexually Abused: No    FAMILY HISTORY:  Family History  Problem Relation Age of Onset  . Diabetes Mother   . Cancer Father   . Diabetes Father   . Diabetes Sister   . Diabetes Brother   . Colon polyps Neg Hx   . Colon cancer Neg Hx   . Stroke Neg Hx     CURRENT MEDICATIONS:  Current Outpatient Medications  Medication Sig Dispense Refill  . acetaminophen (TYLENOL) 325 MG tablet Take 650 mg by mouth as needed for moderate pain or headache.    . ergocalciferol (VITAMIN D2) 1.25 MG (50000 UT) capsule Take 1 capsule (50,000 Units total) by mouth once a week. 30 capsule 1  . vitamin B-12 (CYANOCOBALAMIN) 500 MCG tablet Take 500 mcg by mouth daily.     No current facility-administered medications for this visit.   Facility-Administered Medications Ordered in Other Visits  Medication Dose Route Frequency Provider Last Rate Last Admin  . 0.9 %  sodium chloride infusion   Intravenous Continuous Derek Jack, MD   Paused at 11/17/18 0932    ALLERGIES:  Allergies  Allergen Reactions  . Cefzil [Cefprozil] Rash  . Celebrex [Celecoxib] Hives and Itching  . Sulfonamide Derivatives Itching    PHYSICAL EXAM:  Performance status (ECOG): 1 - Symptomatic but completely ambulatory  Vitals:   11/23/20 1535  BP: (!) 145/85  Pulse: 96  Resp: 20  Temp: (!) 97 F (36.1 C)  SpO2: 97%   Wt Readings from Last 3 Encounters:  11/23/20 222 lb 3.2 oz (100.8 kg)  10/23/20 221 lb 2 oz (100.3 kg)  10/03/20 223 lb (101.2 kg)   Physical Exam Vitals reviewed.  Constitutional:      Appearance: Normal appearance. She is obese.  Cardiovascular:     Rate and Rhythm: Normal rate and regular rhythm.     Pulses: Normal pulses.     Heart sounds: Normal heart sounds.  Pulmonary:      Effort: Pulmonary effort is normal.     Breath sounds: Normal breath sounds.  Neurological:     General: No focal deficit present.     Mental Status: She is alert and oriented to person, place, and time.  Psychiatric:        Mood and Affect: Mood normal.        Behavior: Behavior normal.     LABORATORY DATA:  I have reviewed the labs as listed.  CBC Latest Ref Rng & Units 10/31/2020 08/02/2020 04/13/2020  WBC 4.0 - 10.5 K/uL 5.9 7.3 8.2  Hemoglobin 12.0 - 15.0 g/dL 13.1 13.3 13.0  Hematocrit 36.0 - 46.0 % 39.0 40.3  40.5  Platelets 150 - 400 K/uL 196 223 248   CMP Latest Ref Rng & Units 08/02/2020 04/13/2020 12/30/2019  Glucose 70 - 99 mg/dL 106(H) 107(H) 130(H)  BUN 8 - 23 mg/dL 19 19 20   Creatinine 0.44 - 1.00 mg/dL 0.65 0.72 0.64  Sodium 135 - 145 mmol/L 138 137 141  Potassium 3.5 - 5.1 mmol/L 3.7 3.8 3.8  Chloride 98 - 111 mmol/L 106 104 107  CO2 22 - 32 mmol/L 23 22 21(L)  Calcium 8.9 - 10.3 mg/dL 9.2 9.1 9.1  Total Protein 6.5 - 8.1 g/dL 7.5 7.0 7.2  Total Bilirubin 0.3 - 1.2 mg/dL 0.6 0.6 0.5  Alkaline Phos 38 - 126 U/L 93 77 84  AST 15 - 41 U/L 23 16 17   ALT 0 - 44 U/L 27 21 20       Component Value Date/Time   RBC 4.31 10/31/2020 1146   MCV 90.5 10/31/2020 1146   MCH 30.4 10/31/2020 1146   MCHC 33.6 10/31/2020 1146   RDW 13.1 10/31/2020 1146   LYMPHSABS 1.3 10/31/2020 1146   MONOABS 0.4 10/31/2020 1146   EOSABS 0.1 10/31/2020 1146   BASOSABS 0.0 10/31/2020 1146   Lab Results  Component Value Date   VD25OH 64.67 10/31/2020   VD25OH 52.38 08/02/2020   VD25OH 50.46 04/13/2020   Lab Results  Component Value Date   TIBC 248 (L) 10/31/2020   TIBC 294 08/02/2020   TIBC 277 04/13/2020   FERRITIN 218 10/31/2020   FERRITIN 139 08/02/2020   FERRITIN 104 04/13/2020   IRONPCTSAT 19 10/31/2020   IRONPCTSAT 17 08/02/2020   IRONPCTSAT 19 04/13/2020    DIAGNOSTIC IMAGING:  I have independently reviewed the scans and discussed with the patient. No results found.    ASSESSMENT:  1. Iron deficiency anemia: -She has felt to have anemia due to chronic blood loss and malabsorption.  - She requires intermittent Feraheme. -Patient does best with every 3 months infusions due to her extreme fatigue if she gets below 100 on her ferritin.   2. B12 deficiency: - She was initially treated with B12 injections, currently on B12 tablet daily.  3. Vitamin D deficiency: -She has been started on 50,000 units weekly vitamin D for 1 year.  4. Crohn's disease: - She has not seen her GI doctor since COVID-19 pandemic.   PLAN:  1. Iron deficiency anemia: - Last Feraheme on 08/23/2020 on 08/30/2020. - Reviewed labs which showed ferritin of 218.  Hemoglobin is 13.1. - She is complaining of fatigue which is gradually worsening. - I have recommended 1 infusion of Feraheme.  RTC 3 months with repeat CBC, ferritin and iron panel.  2. B12 deficiency: -  Vitamin B12 is 260.  Continue B12 1 mg tablet daily.  3. Vitamin D deficiency: - Vitamin D is 19.  Continue vitamin D 50,000 units weekly.  4. Crohn's disease: - Her disease is inactive in the last 3 years.  No treatment at this time.  Orders placed this encounter:  Orders Placed This Encounter  Procedures  . CBC with Differential/Platelet  . Ferritin  . Iron and TIBC  . Vitamin B12     Derek Jack, MD [facility] [phone]   I, Milinda Antis, am acting as a scribe for Dr. Sanda Linger.  I, Derek Jack MD, have reviewed the above documentation for accuracy and completeness, and I agree with the above.

## 2020-11-23 NOTE — Patient Instructions (Signed)
[place] at [facility] Discharge Instructions  You were seen today by Dr. Delton Coombes. He went over your recent results. You will be scheduled to have another iron infusion. Dr. Delton Coombes will see you back in 3 months for labs and follow up.   Thank you for choosing [place] at [facility] to provide your oncology and hematology care.  To afford each patient quality time with our provider, please arrive at least 15 minutes before your scheduled appointment time.   If you have a lab appointment with the [facility] please come in thru the Main Entrance and check in at the main information desk  You need to re-schedule your appointment should you arrive 10 or more minutes late.  We strive to give you quality time with our providers, and arriving late affects you and other patients whose appointments are after yours.  Also, if you no show three or more times for appointments you may be dismissed from the clinic at the providers discretion.     Again, thank you for choosing [facility].  Our hope is that these requests will decrease the amount of time that you wait before being seen by our physicians.       _____________________________________________________________  Should you have questions after your visit to [facility], please contact our office at [phone] between the hours of 8:00 a.m. and 4:30 p.m.  Voicemails left after 4:00 p.m. will not be returned until the following business day.  For prescription refill requests, have your pharmacy contact our office and allow 72 hours.    Cancer Center Support Programs:   > Cancer Support Group  2nd Tuesday of the month 1pm-2pm, Journey Room

## 2020-11-27 NOTE — Progress Notes (Signed)
Intravenous Iron Formulation Change  [name] has insurance that requires a change in intravenous iron product from Feraheme to Venofer. Orders have been updated to reflect this change and scheduling message sent to adjust infusion appointments. Dr Delton Coombes notified and agrees with the plan.  Allergies:  Allergies  Allergen Reactions  . Cefzil [Cefprozil] Rash  . Celebrex [Celecoxib] Hives and Itching  . Sulfonamide Derivatives Itching    The plan for iron therapy is as follows: Venofer 300 mg IVPB x 2 doses with appropriate premedication.  Wynona Neat, PharmD 11/27/2020

## 2020-11-28 ENCOUNTER — Ambulatory Visit (HOSPITAL_COMMUNITY): Payer: Medicare HMO

## 2020-12-01 ENCOUNTER — Ambulatory Visit (HOSPITAL_COMMUNITY): Payer: Medicare HMO

## 2020-12-05 ENCOUNTER — Inpatient Hospital Stay (HOSPITAL_COMMUNITY): Payer: Medicare HMO | Attending: Hematology

## 2020-12-05 ENCOUNTER — Encounter (HOSPITAL_COMMUNITY): Payer: Self-pay

## 2020-12-05 ENCOUNTER — Other Ambulatory Visit: Payer: Self-pay

## 2020-12-05 VITALS — BP 140/59 | HR 67 | Temp 96.8°F | Resp 18

## 2020-12-05 DIAGNOSIS — D509 Iron deficiency anemia, unspecified: Secondary | ICD-10-CM | POA: Diagnosis present

## 2020-12-05 DIAGNOSIS — D500 Iron deficiency anemia secondary to blood loss (chronic): Secondary | ICD-10-CM

## 2020-12-05 MED ORDER — ACETAMINOPHEN 325 MG PO TABS
650.0000 mg | ORAL_TABLET | Freq: Once | ORAL | Status: AC
  Administered 2020-12-05: 650 mg via ORAL
  Filled 2020-12-05: qty 2

## 2020-12-05 MED ORDER — SODIUM CHLORIDE 0.9 % IV SOLN
300.0000 mg | Freq: Once | INTRAVENOUS | Status: AC
  Administered 2020-12-05: 300 mg via INTRAVENOUS
  Filled 2020-12-05: qty 300

## 2020-12-05 MED ORDER — SODIUM CHLORIDE 0.9 % IV SOLN: Freq: Once | INTRAVENOUS | Status: AC

## 2020-12-05 MED ORDER — FAMOTIDINE 20 MG PO TABS
20.0000 mg | ORAL_TABLET | Freq: Once | ORAL | Status: AC
  Administered 2020-12-05: 20 mg via ORAL
  Filled 2020-12-05: qty 1

## 2020-12-05 MED ORDER — LORATADINE 10 MG PO TABS
10.0000 mg | ORAL_TABLET | Freq: Once | ORAL | Status: AC
  Administered 2020-12-05: 10 mg via ORAL
  Filled 2020-12-05: qty 1

## 2020-12-05 NOTE — Patient Instructions (Signed)
[place] [place]  Discharge Instructions: Thank you for choosing [facility] to provide your oncology and hematology care.  If you have a lab appointment with the [facility], please come in thru the Main Entrance and check in at the main information desk.  Wear comfortable clothing and clothing appropriate for easy access to any Portacath or PICC line.   We strive to give you quality time with your provider. You may need to reschedule your appointment if you arrive late (15 or more minutes).  Arriving late affects you and other patients whose appointments are after yours.  Also, if you miss three or more appointments without notifying the office, you may be dismissed from the clinic at the provider's discretion.      For prescription refill requests, have your pharmacy contact our office and allow 72 hours for refills to be completed.    Today you received Venofer today, return as scheduled.     To help prevent nausea and vomiting after your treatment, we encourage you to take your nausea medication as directed.  BELOW ARE SYMPTOMS THAT SHOULD BE REPORTED IMMEDIATELY: . *FEVER GREATER THAN 100.4 F (38 C) OR HIGHER . *CHILLS OR SWEATING . *NAUSEA AND VOMITING THAT IS NOT CONTROLLED WITH YOUR NAUSEA MEDICATION . *UNUSUAL SHORTNESS OF BREATH . *UNUSUAL BRUISING OR BLEEDING . *URINARY PROBLEMS (pain or burning when urinating, or frequent urination) . *BOWEL PROBLEMS (unusual diarrhea, constipation, pain near the anus) . TENDERNESS IN MOUTH AND THROAT WITH OR WITHOUT PRESENCE OF ULCERS (sore throat, sores in mouth, or a toothache) . UNUSUAL RASH, SWELLING OR PAIN  . UNUSUAL VAGINAL DISCHARGE OR ITCHING   Items with * indicate a potential emergency and should be followed up as soon as possible or go to the Emergency Department if any problems should occur.  Please show the CHEMOTHERAPY ALERT CARD or IMMUNOTHERAPY ALERT CARD at check-in to the Emergency Department and  triage nurse.  Should you have questions after your visit or need to cancel or reschedule your appointment, please contact [facility] [phone]  and follow the prompts.  Office hours are 8:00 a.m. to 4:30 p.m. Monday - Friday. Please note that voicemails left after 4:00 p.m. may not be returned until the following business day.  We are closed weekends and major holidays. You have access to a nurse at all times for urgent questions. Please call the main number to the clinic [phone] and follow the prompts.  For any non-urgent questions, you may also contact your provider using MyChart. We now offer e-Visits for anyone 20 and older to request care online for non-urgent symptoms. For details visit [email or web address].   Also download the MyChart app! Go to the app store, search "MyChart", open the app, select [facility], and log in with your MyChart username and password.  Due to Covid, a mask is required upon entering the hospital/clinic. If you do not have a mask, one will be given to you upon arrival. For doctor visits, patients may have 1 support person aged 38 or older with them. For treatment visits, patients cannot have anyone with them due to current Covid guidelines and our immunocompromised population.

## 2020-12-05 NOTE — Progress Notes (Signed)
Patient tolerated iron infusion with no complaints voiced. Peripheral IV site clean and dry with good blood return noted before and after infusion. Band aid applied. VSS with discharge and left in satisfactory condition with no s/s of distress noted.

## 2020-12-07 ENCOUNTER — Encounter: Payer: Self-pay | Admitting: Orthopedic Surgery

## 2020-12-07 ENCOUNTER — Ambulatory Visit: Payer: Medicare HMO | Admitting: Orthopedic Surgery

## 2020-12-12 ENCOUNTER — Inpatient Hospital Stay (HOSPITAL_COMMUNITY): Payer: Medicare HMO

## 2020-12-12 ENCOUNTER — Other Ambulatory Visit: Payer: Self-pay

## 2020-12-12 ENCOUNTER — Encounter (HOSPITAL_COMMUNITY): Payer: Self-pay

## 2020-12-12 VITALS — BP 137/67 | HR 58 | Temp 97.0°F | Resp 18 | Wt 215.6 lb

## 2020-12-12 DIAGNOSIS — D509 Iron deficiency anemia, unspecified: Secondary | ICD-10-CM | POA: Diagnosis not present

## 2020-12-12 DIAGNOSIS — D500 Iron deficiency anemia secondary to blood loss (chronic): Secondary | ICD-10-CM

## 2020-12-12 MED ORDER — ACETAMINOPHEN 325 MG PO TABS
650.0000 mg | ORAL_TABLET | Freq: Once | ORAL | Status: DC
  Filled 2020-12-12: qty 2

## 2020-12-12 MED ORDER — SODIUM CHLORIDE 0.9 % IV SOLN
300.0000 mg | Freq: Once | INTRAVENOUS | Status: AC
  Administered 2020-12-12: 300 mg via INTRAVENOUS
  Filled 2020-12-12: qty 300

## 2020-12-12 MED ORDER — SODIUM CHLORIDE 0.9 % IV SOLN: Freq: Once | INTRAVENOUS | Status: AC

## 2020-12-12 MED ORDER — LORATADINE 10 MG PO TABS
10.0000 mg | ORAL_TABLET | Freq: Once | ORAL | Status: DC
  Filled 2020-12-12: qty 1

## 2020-12-12 MED ORDER — FAMOTIDINE 20 MG PO TABS
20.0000 mg | ORAL_TABLET | Freq: Once | ORAL | Status: DC
  Filled 2020-12-12: qty 1

## 2020-12-12 NOTE — Patient Instructions (Signed)
[facility]  Discharge Instructions: Thank you for choosing [facility] to provide your oncology and hematology care.  If you have a lab appointment with the [facility], please come in thru the Main Entrance and check in at the main information desk.  Wear comfortable clothing and clothing appropriate for easy access to any Portacath or PICC line.   We strive to give you quality time with your provider. You may need to reschedule your appointment if you arrive late (15 or more minutes).  Arriving late affects you and other patients whose appointments are after yours.  Also, if you miss three or more appointments without notifying the office, you may be dismissed from the clinic at the provider's discretion.      For prescription refill requests, have your pharmacy contact our office and allow 72 hours for refills to be completed.    venofer 300 mg.  Return as scheduled.  Please call the clinic if you have any questions or concerns.      To help prevent nausea and vomiting after your treatment, we encourage you to take your nausea medication as directed.  BELOW ARE SYMPTOMS THAT SHOULD BE REPORTED IMMEDIATELY: . *FEVER GREATER THAN 100.4 F (38 C) OR HIGHER . *CHILLS OR SWEATING . *NAUSEA AND VOMITING THAT IS NOT CONTROLLED WITH YOUR NAUSEA MEDICATION . *UNUSUAL SHORTNESS OF BREATH . *UNUSUAL BRUISING OR BLEEDING . *URINARY PROBLEMS (pain or burning when urinating, or frequent urination) . *BOWEL PROBLEMS (unusual diarrhea, constipation, pain near the anus) . TENDERNESS IN MOUTH AND THROAT WITH OR WITHOUT PRESENCE OF ULCERS (sore throat, sores in mouth, or a toothache) . UNUSUAL RASH, SWELLING OR PAIN  . UNUSUAL VAGINAL DISCHARGE OR ITCHING   Items with * indicate a potential emergency and should be followed up as soon as possible or go to the Emergency Department if any problems should occur.  Please show the CHEMOTHERAPY ALERT CARD or IMMUNOTHERAPY ALERT  CARD at check-in to the Emergency Department and triage nurse.  Should you have questions after your visit or need to cancel or reschedule your appointment, please contact [facility] [phone]  and follow the prompts.  Office hours are 8:00 a.m. to 4:30 p.m. Monday - Friday. Please note that voicemails left after 4:00 p.m. may not be returned until the following business day.  We are closed weekends and major holidays. You have access to a nurse at all times for urgent questions. Please call the main number to the clinic [phone] and follow the prompts.  For any non-urgent questions, you may also contact your provider using MyChart. We now offer e-Visits for anyone 2 and older to request care online for non-urgent symptoms. For details visit [email or web address].   Also download the MyChart app! Go to the app store, search "MyChart", open the app, select [facility], and log in with your MyChart username and password.  Due to Covid, a mask is required upon entering the hospital/clinic. If you do not have a mask, one will be given to you upon arrival. For doctor visits, patients may have 1 support person aged 40 or older with them. For treatment visits, patients cannot have anyone with them due to current Covid guidelines and our immunocompromised population.

## 2020-12-12 NOTE — Progress Notes (Signed)
Pt here for venofer 300 mg, pre-medication taken at home before arrival to hospital.   Pt tolerated infusion well today without incidence.  Pt stable during and after infusion.  Vital signs stable prior to discharge.  AVS reviewed.  Discharged in stable condition ambulatory.

## 2020-12-14 ENCOUNTER — Encounter: Payer: Self-pay | Admitting: Orthopedic Surgery

## 2020-12-14 ENCOUNTER — Ambulatory Visit: Payer: Medicare HMO | Admitting: Orthopedic Surgery

## 2020-12-14 ENCOUNTER — Other Ambulatory Visit: Payer: Self-pay

## 2020-12-14 VITALS — BP 144/96 | HR 91 | Ht 65.0 in | Wt 216.2 lb

## 2020-12-14 DIAGNOSIS — M5441 Lumbago with sciatica, right side: Secondary | ICD-10-CM | POA: Diagnosis not present

## 2020-12-14 DIAGNOSIS — M5442 Lumbago with sciatica, left side: Secondary | ICD-10-CM | POA: Diagnosis not present

## 2020-12-14 DIAGNOSIS — G8929 Other chronic pain: Secondary | ICD-10-CM | POA: Diagnosis not present

## 2020-12-14 MED ORDER — METHYLPREDNISOLONE ACETATE 40 MG/ML IJ SUSP
40.0000 mg | Freq: Once | INTRAMUSCULAR | Status: AC
  Administered 2020-12-14: 40 mg via INTRAMUSCULAR

## 2020-12-14 NOTE — Patient Instructions (Signed)
While we are working on your approval for MRI please go ahead and call to schedule your appointment with [facility] within at least one (1) week.   Central Scheduling [phone]  

## 2020-12-14 NOTE — Progress Notes (Signed)
Chief Complaint  Patient presents with  . Back Pain    Follow up/therapy   70 year old female no improvement in her lower back and bilateral leg pain after physical therapy  First visit was October 23, 2020  Recommend MRI and IM shot left hip  Nurse gave IM shot 40 mg Depo-Medrol no complications  Previous history 70 year old female has been having some intermittent buttock pain with leg discomfort for some time but got up one morning had severe pain down her right leg all the way to the foot progressively got worse.  She rubbed some medication and used heat it seems a little bit better now although the symptoms seem to be more intermittent with right greater than left leg pain     Review of Systems  Constitutional: Negative for fever.  Respiratory: Negative for shortness of breath.   Cardiovascular: Negative for chest pain.  Musculoskeletal: Positive for back pain.  Skin: Negative.   Neurological: Negative for tingling and sensory change.    Previous examination findings  Neurologically no sensation loss or deficits or pathologic reflexes   Psychological: Awake alert and oriented x3 mood and affect normal   Skin no lacerations or ulcerations no nodularity no palpable masses, no erythema or nodularity   Musculoskeletal:    Right and left hip move normally without any pain on range of motion   Tenderness right and left side of the lower back as well as the midline with increased lordosis

## 2020-12-26 ENCOUNTER — Telehealth: Payer: Self-pay | Admitting: Radiology

## 2020-12-26 NOTE — Telephone Encounter (Signed)
MRI scan denied by [facility] can I schedule a Peer to peer?

## 2020-12-26 NOTE — Telephone Encounter (Signed)
I scheduled it for Thursday am, asked them to call and speak to me. I will hopefully be able to handle it. Thanks.

## 2020-12-28 ENCOUNTER — Telehealth: Payer: Self-pay | Admitting: Radiology

## 2020-12-28 NOTE — Telephone Encounter (Signed)
Left message  MRI still denied  Has not been full 3 months since she has been undergoing conservative treatment Needs appointment beginning of July will have to try again

## 2020-12-28 NOTE — Telephone Encounter (Signed)
Patient made appointment for a return visit I advised her of the insurance company denial for the MRI

## 2020-12-29 ENCOUNTER — Ambulatory Visit (HOSPITAL_COMMUNITY): Payer: Medicare HMO

## 2021-01-03 DIAGNOSIS — H5203 Hypermetropia, bilateral: Secondary | ICD-10-CM | POA: Diagnosis not present

## 2021-01-11 ENCOUNTER — Encounter (HOSPITAL_COMMUNITY): Payer: Self-pay | Admitting: Physical Therapy

## 2021-02-07 ENCOUNTER — Ambulatory Visit: Payer: Medicare HMO | Admitting: Orthopedic Surgery

## 2021-02-26 ENCOUNTER — Ambulatory Visit: Payer: Medicare HMO | Admitting: Orthopedic Surgery

## 2021-02-26 ENCOUNTER — Other Ambulatory Visit: Payer: Self-pay

## 2021-02-26 VITALS — BP 142/111 | HR 105 | Ht 64.0 in | Wt 209.5 lb

## 2021-02-26 DIAGNOSIS — M5441 Lumbago with sciatica, right side: Secondary | ICD-10-CM | POA: Diagnosis not present

## 2021-02-26 DIAGNOSIS — G8929 Other chronic pain: Secondary | ICD-10-CM | POA: Diagnosis not present

## 2021-02-26 NOTE — Progress Notes (Signed)
FOLLOW UP   Encounter Diagnosis  Name Primary?   Chronic right-sided low back pain with right-sided sciatica Yes     Chief Complaint  Patient presents with   Back Pain    Still hurting right much, using rubs     [name] had an MRI ordered and it was denied by her insurance she has now completed all the criteria for MRI including but not limited to medication physical therapy activity modification greater than 4 weeks of treatment.  She says her pain is worse  Her exam is consistent with lumbar disc disease with right-sided sciatica and positive straight leg raise  MRI lumbar to prepare for epidural injection

## 2021-02-26 NOTE — Patient Instructions (Signed)
While we are working on your approval for MRI please go ahead and call to schedule your appointment with [facility] within at least one (1) week.   Central Scheduling [phone]  

## 2021-02-27 ENCOUNTER — Inpatient Hospital Stay (HOSPITAL_COMMUNITY): Payer: Medicare HMO | Attending: Hematology

## 2021-02-27 DIAGNOSIS — Z79899 Other long term (current) drug therapy: Secondary | ICD-10-CM | POA: Diagnosis not present

## 2021-02-27 DIAGNOSIS — D509 Iron deficiency anemia, unspecified: Secondary | ICD-10-CM | POA: Diagnosis not present

## 2021-02-27 DIAGNOSIS — D500 Iron deficiency anemia secondary to blood loss (chronic): Secondary | ICD-10-CM

## 2021-02-27 LAB — CBC WITH DIFFERENTIAL/PLATELET
Abs Immature Granulocytes: 0.04 10*3/uL (ref 0.00–0.07)
Basophils Absolute: 0.1 10*3/uL (ref 0.0–0.1)
Basophils Relative: 1 %
Eosinophils Absolute: 0.1 10*3/uL (ref 0.0–0.5)
Eosinophils Relative: 1 %
HCT: 39.4 % (ref 36.0–46.0)
Hemoglobin: 13 g/dL (ref 12.0–15.0)
Immature Granulocytes: 1 %
Lymphocytes Relative: 24 %
Lymphs Abs: 1.7 10*3/uL (ref 0.7–4.0)
MCH: 29.9 pg (ref 26.0–34.0)
MCHC: 33 g/dL (ref 30.0–36.0)
MCV: 90.6 fL (ref 80.0–100.0)
Monocytes Absolute: 0.5 10*3/uL (ref 0.1–1.0)
Monocytes Relative: 8 %
Neutro Abs: 4.7 10*3/uL (ref 1.7–7.7)
Neutrophils Relative %: 65 %
Platelets: 217 10*3/uL (ref 150–400)
RBC: 4.35 MIL/uL (ref 3.87–5.11)
RDW: 12.6 % (ref 11.5–15.5)
WBC: 7.1 10*3/uL (ref 4.0–10.5)
nRBC: 0 % (ref 0.0–0.2)

## 2021-02-27 LAB — IRON AND TIBC
Iron: 34 ug/dL (ref 28–170)
Saturation Ratios: 12 % (ref 10.4–31.8)
TIBC: 279 ug/dL (ref 250–450)
UIBC: 245 ug/dL

## 2021-02-27 LAB — VITAMIN B12: Vitamin B-12: 182 pg/mL (ref 180–914)

## 2021-02-27 LAB — FERRITIN: Ferritin: 180 ng/mL (ref 11–307)

## 2021-03-06 ENCOUNTER — Ambulatory Visit (HOSPITAL_COMMUNITY): Payer: Medicare HMO | Admitting: Hematology

## 2021-03-07 ENCOUNTER — Other Ambulatory Visit: Payer: Self-pay

## 2021-03-07 ENCOUNTER — Inpatient Hospital Stay (HOSPITAL_COMMUNITY): Payer: Medicare HMO | Admitting: Oncology

## 2021-03-07 VITALS — BP 139/67 | HR 65 | Temp 96.7°F | Resp 18 | Wt 210.0 lb

## 2021-03-07 DIAGNOSIS — D500 Iron deficiency anemia secondary to blood loss (chronic): Secondary | ICD-10-CM | POA: Diagnosis not present

## 2021-03-07 DIAGNOSIS — E538 Deficiency of other specified B group vitamins: Secondary | ICD-10-CM

## 2021-03-07 NOTE — Progress Notes (Signed)
[address], [place] [address]   CLINIC:  Medical Oncology/Hematology  PCP:  Sara Drown, MD [address] [place] / [place] [address]  [phone]  REASON FOR VISIT:  Follow-up for IDA  PRIOR THERAPY: None  CURRENT THERAPY: Intermittent Feraheme  I connected with [name] on 03/07/21 at 12:00 PM EDT by video enabled telemedicine visit and verified that I am speaking with the correct person using two identifiers.   I discussed the limitations, risks, security and privacy concerns of performing an evaluation and management service by telemedicine and the availability of in-person appointments. I also discussed with the patient that there may be a patient responsible charge related to this service. The patient expressed understanding and agreed to proceed.   Other persons participating in the visit and their role in the encounter: RN, NP and patient    Patient's location: [facility]   Provider's location: [facility]    INTERVAL HISTORY:  [name], a 71 y.o. female, returns for routine follow-up for her IDA. [name] was last seen on 11/23/20.   She receives intermittent IV iron last given on 12/05/2020 and 12/12/2020.  Reports feeling "fair".  States she does feel better after her IV infusions and starts to notice a decline around month 3.  She denies any recent bleeding including melena, hematochezia or hematuria.  Denies any ice pica.  Denies any recent Crohn's flares.  Has hip and back problems and is followed by Dr. Aline Hart.  Will be having an MRI in the near future.  Receives hip injections.  REVIEW OF SYSTEMS:  Review of Systems  Constitutional:  Positive for fatigue.   PAST MEDICAL/SURGICAL HISTORY:  Past Medical History:  Diagnosis Date   Arthritis    B12 deficiency 07/09/2015   Crohn disease ([place]) JULY 2011 [place] TI ULCERS seen on CE, NO NSAID USE   Endometriosis    HISTORY  LEADING TO HYSTERECTOMY    Fibromyalgia    Fibromyalgia 12/09/2013   Gastric ulcer    GERD (gastroesophageal reflux disease)    Headache    Helicobacter pylori gastritis    HISTORY   History of hiatal hernia    Hives    Iron (Fe) deficiency anemia 2009   JAN 2012 NL HB & FERRITIN   Obesity    Osteoporosis 11/20/2014   Plantar fasciitis    Past Surgical History:  Procedure Laterality Date   ABDOMINAL HYSTERECTOMY     CATARACT EXTRACTION W/PHACO Right 12/05/2015   Procedure: CATARACT EXTRACTION PHACO AND INTRAOCULAR LENS PLACEMENT ([place]);  Surgeon: Birder Robson, MD;  Location: [facility] ORS;  Service: Ophthalmology;  Laterality: Right;  [place] 00:59.9 [facility]% 21.9 CDE 13.10 fluid pack lot # [id number]   CATARACT EXTRACTION W/PHACO Left 06/24/2016   Procedure: CATARACT EXTRACTION PHACO AND INTRAOCULAR LENS PLACEMENT LEFT EYE; CDE:  8.33;  Surgeon: Tonny Branch, MD;  Location: [facility];  Service: Ophthalmology;  Laterality: Left;   COLONOSCOPY     ESOPHAGOGASTRODUODENOSCOPY  10/14/2007   Esophagus showed 2 cm pink tongue seen extending from the GE junction.  Biopsies obtained via cold forceps.  Otherwise the  esophagus was without erosions, mass, ulceration, or stricture / . Large hiatal hernia pouch with linear erosions, and two 3-6 mmulcers seen in the pouch.  Biopsies obtained via cold forceps to  evaluate for H. pylori gastritis or malignancy   Ileocolonoscopy  10/14/2007   Multiple 1-3 mm terminal ileum ulcers biopsied  The ulcers seemed to only involve the last 5 cm of her terminal ileum, and 10 cm of her ileum was visualized.  Otherwise the colon  was without polyps, masses, diverticula, or AVMs.  She had a normal retroflexed view of the rectum   OVARIAN CYST REMOVAL     TOTAL ABDOMINAL HYSTERECTOMY W/ BILATERAL SALPINGOOPHORECTOMY     UPPER GASTROINTESTINAL ENDOSCOPY  JULY 2011 DIARRHEA/FEDA    Bx-H.PYLORI GASTRITIS, NL DUODENUM    SOCIAL HISTORY:  Social History   Socioeconomic History   Marital status: Married     Spouse name: Alvester Chou    Number of children: 3   Years of education: 12   Highest education level: Not on file  Occupational History   Occupation: Retired  Tobacco Use   Smoking status: Never   Smokeless tobacco: Never  Vaping Use   Vaping Use: Never used  Substance and Sexual Activity   Alcohol use: No   Drug use: No   Sexual activity: Yes    Birth control/protection: Surgical  Other Topics Concern   Not on file  Social History Narrative   Lives w/ husband   Caffeine use: Drinks 1-2 cups coffee per day   Social Determinants of Health   Financial Resource Strain: Not on file  Food Insecurity: Not on file  Transportation Needs: Not on file  Physical Activity: Not on file  Stress: Not on file  Social Connections: Not on file  Intimate Partner Violence: Not At Risk   Fear of Current or Ex-Partner: No   Emotionally Abused: No   Physically Abused: No   Sexually Abused: No    FAMILY HISTORY:  Family History  Problem Relation Age of Onset   Diabetes Mother    Cancer Father    Diabetes Father    Diabetes Sister    Diabetes Brother    Colon polyps Neg Hx    Colon cancer Neg Hx    Stroke Neg Hx     CURRENT MEDICATIONS:  Current Outpatient Medications  Medication Sig Dispense Refill   acetaminophen (TYLENOL) 325 MG tablet Take 650 mg by mouth as needed for moderate pain or headache.     ergocalciferol (VITAMIN D2) 1.25 MG (50000 UT) capsule Take 1 capsule (50,000 Units total) by mouth once a week. 30 capsule 1   vitamin B-12 (CYANOCOBALAMIN) 500 MCG tablet Take 500 mcg by mouth daily.     No current facility-administered medications for this visit.   Facility-Administered Medications Ordered in Other Visits  Medication Dose Route Frequency Provider Last Rate Last Admin   0.9 %  sodium chloride infusion   Intravenous Continuous Derek Jack, MD   Paused at 11/17/18 0932    ALLERGIES:  Allergies  Allergen Reactions   Cefzil [Cefprozil] Rash   Celebrex  [Celecoxib] Hives and Itching   Sulfonamide Derivatives Itching    PHYSICAL EXAM:  Performance status (ECOG): 1 - Symptomatic but completely ambulatory  Vitals:   03/07/21 1200  BP: 139/67  Pulse: 65  Resp: 18  Temp: (!) 96.7 F (35.9 C)  SpO2: 97%   Wt Readings from Last 3 Encounters:  03/07/21 210 lb (95.3 kg)  02/26/21 209 lb 8 oz (95 kg)  12/14/20 216 lb 3.2 oz (98.1 kg)   Physical Exam Constitutional:      Appearance: Normal appearance. She is obese.  Neurological:     Mental Status: She is alert and oriented to person, place, and time.    LABORATORY DATA:  I have reviewed  the labs as listed.  CBC Latest Ref Rng & Units 02/27/2021 10/31/2020 08/02/2020  WBC 4.0 - 10.5 K/uL 7.1 5.9 7.3  Hemoglobin 12.0 - 15.0 g/dL 13.0 13.1 13.3  Hematocrit 36.0 - 46.0 % 39.4 39.0 40.3  Platelets 150 - 400 K/uL 217 196 223   CMP Latest Ref Rng & Units 08/02/2020 04/13/2020 12/30/2019  Glucose 70 - 99 mg/dL 106(H) 107(H) 130(H)  BUN 8 - 23 mg/dL 19 19 20   Creatinine 0.44 - 1.00 mg/dL 0.65 0.72 0.64  Sodium 135 - 145 mmol/L 138 137 141  Potassium 3.5 - 5.1 mmol/L 3.7 3.8 3.8  Chloride 98 - 111 mmol/L 106 104 107  CO2 22 - 32 mmol/L 23 22 21(L)  Calcium 8.9 - 10.3 mg/dL 9.2 9.1 9.1  Total Protein 6.5 - 8.1 g/dL 7.5 7.0 7.2  Total Bilirubin 0.3 - 1.2 mg/dL 0.6 0.6 0.5  Alkaline Phos 38 - 126 U/L 93 77 84  AST 15 - 41 U/L 23 16 17   ALT 0 - 44 U/L 27 21 20       Component Value Date/Time   RBC 4.35 02/27/2021 1122   MCV 90.6 02/27/2021 1122   MCH 29.9 02/27/2021 1122   MCHC 33.0 02/27/2021 1122   RDW 12.6 02/27/2021 1122   LYMPHSABS 1.7 02/27/2021 1122   MONOABS 0.5 02/27/2021 1122   EOSABS 0.1 02/27/2021 1122   BASOSABS 0.1 02/27/2021 1122   Lab Results  Component Value Date   VD25OH 64.67 10/31/2020   VD25OH 52.38 08/02/2020   VD25OH 50.46 04/13/2020   Lab Results  Component Value Date   TIBC 279 02/27/2021   TIBC 248 (L) 10/31/2020   TIBC 294 08/02/2020   FERRITIN 180  02/27/2021   FERRITIN 218 10/31/2020   FERRITIN 139 08/02/2020   IRONPCTSAT 12 02/27/2021   IRONPCTSAT 19 10/31/2020   IRONPCTSAT 17 08/02/2020    DIAGNOSTIC IMAGING:  I have independently reviewed the scans and discussed with the patient. No results found.   ASSESSMENT:   PLAN:  1.  Iron deficiency anemia: Secondary to chronic blood loss and malabsorption. Initially received IV Feraheme every few months but recently switched to IV Venofer due to insurance. Last received 300 mg IV Venofer on 12/05/2020 and 12/12/2020. Labs from 02/27/2021 show a hemoglobin of 13.0, ferritin 180 and iron saturation of 12%. She continues to complain of fatigue which is stable. Recommend 2 doses of IV Venofer given 1 week apart. RTC in 3 months with repeat CBC, ferritin and iron panel.   2.  B12 deficiency: Previously was getting B12 injections at her PCPs office. B12 level is low today at 182. Recommend monthly B12 injections and continuation of B12 supplements.  3.  Vitamin D deficiency: Continues vitamin D 50,000 units weekly.  4.  Crohn's disease: Not on active treatment.  Last flare was about 3 years ago.  5.  Hip and back problems: Chronic and stable.  Is followed by Dr. Aline Hart whom she just saw on 02/26/2021.  Plan is for MRI once insurance approves.   Orders placed this encounter:  No orders of the defined types were placed in this encounter.   I spent 15 minutes dedicated to the care of this patient (face-to-face and non-face-to-face) on the date of the encounter to include what is described in the assessment and plan.   Faythe Casa, NP 03/07/2021 12:32 PM

## 2021-03-07 NOTE — Patient Instructions (Addendum)
[place] at [facility] Discharge Instructions  You were seen today by Faythe Casa, NP. She discussed your recent lab work everything looks stable except your ferritin and B 12 level. They have decreased since your last visit. Please contact your primary doctor for your B12 injection. Take Tylenol 664m , Benadryl 12.552m25mg, Pepcid 2089m0 minutes before your appointment to have your Venofer iron infusion.   Please follow up as scheduled.   Thank you for choosing [facility] provide your oncology and hematology care.  To afford each patient quality time with our provider, please arrive at least 15 minutes before your scheduled appointment time.   If you have a lab appointment with the [facility] come in thru the Main Entrance and check in at the main information desk.  You need to re-schedule your appointment should you arrive 10 or more minutes late.  We strive to give you quality time with our providers, and arriving late affects you and other patients whose appointments are after yours.  Also, if you no show three or more times for appointments you may be dismissed from the clinic at the providers discretion.     Again, thank you [facility] hope is that these requests will decrease the amount of time that you wait before being seen by our physicians.       _____________________________________________________________  Should you have questions after your [facility] contact our office at [phone] follow the prompts.  Our office hours are 8:00 a.m. and 4:30 p.m. Monday - Friday.  Please note that voicemails left after 4:00 p.m. may not be returned until the following business day.  We are closed weekends and major holidays.  You do have access to a nurse 24-7, just call the main number to the clinic [phone] do not press any options, hold  on the line and a nurse will answer the phone.    For prescription refill requests, have your pharmacy contact our office and allow 72 hours.    Due to Covid, you will need to wear a mask upon entering the hospital. If you do not have a mask, a mask will be given to you at the Main Entrance upon arrival. For doctor visits, patients may have 1 support person age 39 18 older with them. For treatment visits, patients can not have anyone with them due to social distancing guidelines and our immunocompromised population.

## 2021-03-13 ENCOUNTER — Other Ambulatory Visit: Payer: Self-pay | Admitting: Oncology

## 2021-03-14 ENCOUNTER — Other Ambulatory Visit: Payer: Self-pay

## 2021-03-14 ENCOUNTER — Inpatient Hospital Stay (HOSPITAL_COMMUNITY): Payer: Medicare HMO

## 2021-03-14 ENCOUNTER — Encounter (HOSPITAL_COMMUNITY): Payer: Self-pay

## 2021-03-14 VITALS — BP 122/73 | HR 54 | Temp 96.9°F | Resp 17

## 2021-03-14 DIAGNOSIS — D500 Iron deficiency anemia secondary to blood loss (chronic): Secondary | ICD-10-CM

## 2021-03-14 DIAGNOSIS — Z79899 Other long term (current) drug therapy: Secondary | ICD-10-CM | POA: Diagnosis not present

## 2021-03-14 DIAGNOSIS — D509 Iron deficiency anemia, unspecified: Secondary | ICD-10-CM | POA: Diagnosis not present

## 2021-03-14 MED ORDER — SODIUM CHLORIDE 0.9 % IV SOLN: Freq: Once | INTRAVENOUS | Status: AC

## 2021-03-14 MED ORDER — SODIUM CHLORIDE 0.9 % IV SOLN
300.0000 mg | Freq: Once | INTRAVENOUS | Status: AC
  Administered 2021-03-14: 300 mg via INTRAVENOUS
  Filled 2021-03-14: qty 300

## 2021-03-14 NOTE — Progress Notes (Signed)
Pt presents today for Venofer 300 mg per provider's order. Vital signs stable pt voiced no new complaints at this time.   Peripheral IV started with good blood return pre and post infusion.   Venofer given today per MD orders. Tolerated infusion without adverse affects. Vital signs stable. No complaints at this time. Discharged from clinic ambulatory in stable condition. Alert and oriented x 3. F/U with [facility] as scheduled. Pt did not wait the 30 minute wait time per policy.

## 2021-03-14 NOTE — Patient Instructions (Signed)
[facility]  Discharge Instructions: Thank you for choosing [facility] to provide your oncology and hematology care.  If you have a lab appointment with the [facility], please come in thru the Main Entrance and check in at the main information desk.  Wear comfortable clothing and clothing appropriate for easy access to any Portacath or PICC line.   We strive to give you quality time with your provider. You may need to reschedule your appointment if you arrive late (15 or more minutes).  Arriving late affects you and other patients whose appointments are after yours.  Also, if you miss three or more appointments without notifying the office, you may be dismissed from the clinic at the provider's discretion.      For prescription refill requests, have your pharmacy contact our office and allow 72 hours for refills to be completed.    Today you received Venofer 300 mg IV iron infusion.  BELOW ARE SYMPTOMS THAT SHOULD BE REPORTED IMMEDIATELY: *FEVER GREATER THAN 100.4 F (38 C) OR HIGHER *CHILLS OR SWEATING *NAUSEA AND VOMITING THAT IS NOT CONTROLLED WITH YOUR NAUSEA MEDICATION *UNUSUAL SHORTNESS OF BREATH *UNUSUAL BRUISING OR BLEEDING *URINARY PROBLEMS (pain or burning when urinating, or frequent urination) *BOWEL PROBLEMS (unusual diarrhea, constipation, pain near the anus) TENDERNESS IN MOUTH AND THROAT WITH OR WITHOUT PRESENCE OF ULCERS (sore throat, sores in mouth, or a toothache) UNUSUAL RASH, SWELLING OR PAIN  UNUSUAL VAGINAL DISCHARGE OR ITCHING   Items with * indicate a potential emergency and should be followed up as soon as possible or go to the Emergency Department if any problems should occur.  Please show the CHEMOTHERAPY ALERT CARD or IMMUNOTHERAPY ALERT CARD at check-in to the Emergency Department and triage nurse.  Should you have questions after your visit or need to cancel or reschedule your appointment, please contact [facility]  [phone]  and follow the prompts.  Office hours are 8:00 a.m. to 4:30 p.m. Monday - Friday. Please note that voicemails left after 4:00 p.m. may not be returned until the following business day.  We are closed weekends and major holidays. You have access to a nurse at all times for urgent questions. Please call the main number to the clinic [phone] and follow the prompts.  For any non-urgent questions, you may also contact your provider using MyChart. We now offer e-Visits for anyone 15 and older to request care online for non-urgent symptoms. For details visit [email or web address].   Also download the MyChart app! Go to the app store, search "MyChart", open the app, select [facility], and log in with your MyChart username and password.  Due to Covid, a mask is required upon entering the hospital/clinic. If you do not have a mask, one will be given to you upon arrival. For doctor visits, patients may have 1 support person aged 41 or older with them. For treatment visits, patients cannot have anyone with them due to current Covid guidelines and our immunocompromised population.

## 2021-03-20 ENCOUNTER — Ambulatory Visit (HOSPITAL_COMMUNITY)
Admission: RE | Admit: 2021-03-20 | Discharge: 2021-03-20 | Disposition: A | Discharge status: Home / Self Care | Payer: Medicare HMO | Source: Ambulatory Visit | Attending: Orthopedic Surgery | Admitting: Orthopedic Surgery

## 2021-03-20 ENCOUNTER — Other Ambulatory Visit: Payer: Self-pay

## 2021-03-20 DIAGNOSIS — M545 Low back pain, unspecified: Secondary | ICD-10-CM | POA: Diagnosis not present

## 2021-03-20 DIAGNOSIS — G8929 Other chronic pain: Secondary | ICD-10-CM | POA: Diagnosis not present

## 2021-03-20 DIAGNOSIS — M5441 Lumbago with sciatica, right side: Secondary | ICD-10-CM | POA: Diagnosis not present

## 2021-03-22 ENCOUNTER — Ambulatory Visit: Payer: Medicare HMO | Admitting: Orthopedic Surgery

## 2021-03-22 ENCOUNTER — Encounter: Payer: Self-pay | Admitting: Orthopedic Surgery

## 2021-03-22 ENCOUNTER — Other Ambulatory Visit: Payer: Self-pay

## 2021-03-22 ENCOUNTER — Telehealth: Payer: Self-pay | Admitting: Orthopedic Surgery

## 2021-03-22 VITALS — BP 149/71 | HR 81 | Ht 65.0 in | Wt 209.4 lb

## 2021-03-22 DIAGNOSIS — M47816 Spondylosis without myelopathy or radiculopathy, lumbar region: Secondary | ICD-10-CM | POA: Diagnosis not present

## 2021-03-22 NOTE — Telephone Encounter (Signed)
At time of check out, pt states she will call back for the 3 month follow up appointment. Card was given with after visit summary.

## 2021-03-22 NOTE — Progress Notes (Signed)
Chief Complaint  Patient presents with   Results    MRI Lumbar    Encounter Diagnosis  Name Primary?   Lumbar spondylosis Yes   I read [name]'s MRI and the corresponding report  I agree she has degenerative disc disease spondylosis osteophyte disc complex L4-5 and L5-S1  She says she has tolerable pain but is interested in epidural injections  Epidural injections are ordered follow-up in 3 months

## 2021-03-28 ENCOUNTER — Other Ambulatory Visit: Payer: Self-pay

## 2021-03-28 ENCOUNTER — Encounter (HOSPITAL_COMMUNITY): Payer: Self-pay

## 2021-03-28 ENCOUNTER — Inpatient Hospital Stay (HOSPITAL_COMMUNITY): Payer: Medicare HMO

## 2021-03-28 VITALS — BP 113/56 | HR 65 | Temp 96.8°F | Resp 18

## 2021-03-28 DIAGNOSIS — D509 Iron deficiency anemia, unspecified: Secondary | ICD-10-CM | POA: Diagnosis not present

## 2021-03-28 DIAGNOSIS — D500 Iron deficiency anemia secondary to blood loss (chronic): Secondary | ICD-10-CM

## 2021-03-28 DIAGNOSIS — Z79899 Other long term (current) drug therapy: Secondary | ICD-10-CM | POA: Diagnosis not present

## 2021-03-28 MED ORDER — SODIUM CHLORIDE 0.9 % IV SOLN
300.0000 mg | Freq: Once | INTRAVENOUS | Status: AC
  Administered 2021-03-28: 300 mg via INTRAVENOUS
  Filled 2021-03-28: qty 300

## 2021-03-28 MED ORDER — SODIUM CHLORIDE 0.9 % IV SOLN: Freq: Once | INTRAVENOUS | Status: AC

## 2021-03-28 NOTE — Patient Instructions (Signed)
[facility]  Discharge Instructions: Thank you for choosing [facility] to provide your oncology and hematology care.  If you have a lab appointment with the [facility], please come in thru the Main Entrance and check in at the main information desk.  Wear comfortable clothing and clothing appropriate for easy access to any Portacath or PICC line.   We strive to give you quality time with your provider. You may need to reschedule your appointment if you arrive late (15 or more minutes).  Arriving late affects you and other patients whose appointments are after yours.  Also, if you miss three or more appointments without notifying the office, you may be dismissed from the clinic at the provider's discretion.      For prescription refill requests, have your pharmacy contact our office and allow 72 hours for refills to be completed.    Today you received the following chemotherapy and/or immunotherapy agents venofer      To help prevent nausea and vomiting after your treatment, we encourage you to take your nausea medication as directed.  BELOW ARE SYMPTOMS THAT SHOULD BE REPORTED IMMEDIATELY: *FEVER GREATER THAN 100.4 F (38 C) OR HIGHER *CHILLS OR SWEATING *NAUSEA AND VOMITING THAT IS NOT CONTROLLED WITH YOUR NAUSEA MEDICATION *UNUSUAL SHORTNESS OF BREATH *UNUSUAL BRUISING OR BLEEDING *URINARY PROBLEMS (pain or burning when urinating, or frequent urination) *BOWEL PROBLEMS (unusual diarrhea, constipation, pain near the anus) TENDERNESS IN MOUTH AND THROAT WITH OR WITHOUT PRESENCE OF ULCERS (sore throat, sores in mouth, or a toothache) UNUSUAL RASH, SWELLING OR PAIN  UNUSUAL VAGINAL DISCHARGE OR ITCHING   Items with * indicate a potential emergency and should be followed up as soon as possible or go to the Emergency Department if any problems should occur.  Please show the CHEMOTHERAPY ALERT CARD or IMMUNOTHERAPY ALERT CARD at check-in to the Emergency  Department and triage nurse.  Should you have questions after your visit or need to cancel or reschedule your appointment, please contact [facility] [phone]  and follow the prompts.  Office hours are 8:00 a.m. to 4:30 p.m. Monday - Friday. Please note that voicemails left after 4:00 p.m. may not be returned until the following business day.  We are closed weekends and major holidays. You have access to a nurse at all times for urgent questions. Please call the main number to the clinic [phone] and follow the prompts.  For any non-urgent questions, you may also contact your provider using MyChart. We now offer e-Visits for anyone 7 and older to request care online for non-urgent symptoms. For details visit [email or web address].   Also download the MyChart app! Go to the app store, search "MyChart", open the app, select [facility], and log in with your MyChart username and password.  Due to Covid, a mask is required upon entering the hospital/clinic. If you do not have a mask, one will be given to you upon arrival. For doctor visits, patients may have 1 support person aged 50 or older with them. For treatment visits, patients cannot have anyone with them due to current Covid guidelines and our immunocompromised population.   Iron Sucrose Injection What is this medication? IRON SUCROSE (EYE ern SOO krose) treats low levels of iron (iron deficiency anemia) in people with kidney disease. Iron is a mineral that plays an important role in making red blood cells, which carry oxygen from your lungs to the rest of your body. This medicine may be used for  other purposes; ask your health care provider or pharmacist if you have questions. COMMON BRAND NAME(S): Venofer What should I tell my care team before I take this medication? They need to know if you have any of these conditions: Anemia not caused by low iron levels Heart disease High levels of iron in the blood Kidney  disease Liver disease An unusual or allergic reaction to iron, other medications, foods, dyes, or preservatives Pregnant or trying to get pregnant Breast-feeding How should I use this medication? This medication is for infusion into a vein. It is given in a hospital or clinic setting. Talk to your care team about the use of this medication in children. While this medication may be prescribed for children as young as 2 years for selected conditions, precautions do apply. Overdosage: If you think you have taken too much of this medicine contact a poison control center or emergency room at once. NOTE: This medicine is only for you. Do not share this medicine with others. What if I miss a dose? It is important not to miss your dose. Call your care team if you are unable to keep an appointment. What may interact with this medication? Do not take this medication with any of the following: Deferoxamine Dimercaprol Other iron products This medication may also interact with the following: Chloramphenicol Deferasirox This list may not describe all possible interactions. Give your health care provider a list of all the medicines, herbs, non-prescription drugs, or dietary supplements you use. Also tell them if you smoke, drink alcohol, or use illegal drugs. Some items may interact with your medicine. What should I watch for while using this medication? Visit your care team regularly. Tell your care team if your symptoms do not start to get better or if they get worse. You may need blood work done while you are taking this medication. You may need to follow a special diet. Talk to your care team. Foods that contain iron include: whole grains/cereals, dried fruits, beans, or peas, leafy green vegetables, and organ meats (liver, kidney). What side effects may I notice from receiving this medication? Side effects that you should report to your care team as soon as possible: Allergic reactions-skin rash,  itching, hives, swelling of the face, lips, tongue, or throat Low blood pressure-dizziness, feeling faint or lightheaded, blurry vision Shortness of breath Side effects that usually do not require medical attention (report to your care team if they continue or are bothersome): Flushing Headache Joint pain Muscle pain Nausea Pain, redness, or irritation at injection site This list may not describe all possible side effects. Call your doctor for medical advice about side effects. You may report side effects to FDA at 1-800-FDA-1088. Where should I keep my medication? This medication is given in a hospital or clinic and will not be stored at home. NOTE: This sheet is a summary. It may not cover all possible information. If you have questions about this medicine, talk to your doctor, pharmacist, or health care provider.  2022 Elsevier/Gold Standard (2020-10-10 12:52:06)

## 2021-03-28 NOTE — Progress Notes (Signed)
Pt here for venofer 300 mg infusion.  Pt stated after last infusion for about a day she just felt bad.  States she has never felt like that after any of the other infusions.  Stools were very dark green in color for two days after infusion.   Tolerated iron infusion well today without incidence.  Vital signs stable prior to discharge.  Discharged in stable condition ambulatory.  AVS reviewed. Stable during and after infusion. Pt did not wait the full 30 minute wait time after iron infusion per policy.

## 2021-04-10 ENCOUNTER — Ambulatory Visit: Payer: Medicare HMO | Admitting: Physical Medicine and Rehabilitation

## 2021-04-10 ENCOUNTER — Other Ambulatory Visit: Payer: Self-pay

## 2021-04-10 ENCOUNTER — Encounter: Payer: Self-pay | Admitting: Physical Medicine and Rehabilitation

## 2021-04-10 VITALS — BP 139/66 | HR 71

## 2021-04-10 DIAGNOSIS — M48061 Spinal stenosis, lumbar region without neurogenic claudication: Secondary | ICD-10-CM

## 2021-04-10 DIAGNOSIS — M5116 Intervertebral disc disorders with radiculopathy, lumbar region: Secondary | ICD-10-CM | POA: Diagnosis not present

## 2021-04-10 DIAGNOSIS — M546 Pain in thoracic spine: Secondary | ICD-10-CM

## 2021-04-10 DIAGNOSIS — G8929 Other chronic pain: Secondary | ICD-10-CM

## 2021-04-10 DIAGNOSIS — M47816 Spondylosis without myelopathy or radiculopathy, lumbar region: Secondary | ICD-10-CM | POA: Diagnosis not present

## 2021-04-10 DIAGNOSIS — M7918 Myalgia, other site: Secondary | ICD-10-CM | POA: Diagnosis not present

## 2021-04-10 DIAGNOSIS — M797 Fibromyalgia: Secondary | ICD-10-CM

## 2021-04-10 DIAGNOSIS — M5416 Radiculopathy, lumbar region: Secondary | ICD-10-CM | POA: Diagnosis not present

## 2021-04-10 NOTE — Progress Notes (Signed)
Pt state middle and lower back pain that travels down both legs. Pt state she feels pain in her left buttocks.Pt state walking and standing for long period time makes the pain worse. Pt state takes she takes over the counter pain meds and uses heating to help ease her pain.  Numeric Pain Rating Scale and Functional Assessment Average Pain 10 Pain Right Now 5 My pain is intermittent, dull, and nobbing Pain is worse with: walking, standing, and some activites Pain improves with: heat/ice and medication   In the last MONTH (on 0-10 scale) has pain interfered with the following?  1. General activity like being  able to carry out your everyday physical activities such as walking, climbing stairs, carrying groceries, or moving a chair?  Rating(7)  2. Relation with others like being able to carry out your usual social activities and roles such as  activities at home, at work and in your community. Rating(8)  3. Enjoyment of life such that you have  been bothered by emotional problems such as feeling anxious, depressed or irritable?  Rating(9)

## 2021-04-10 NOTE — Progress Notes (Addendum)
[name] - 70 y.o. female MRN [id number]  Date of birth: May 25, 1951  Office Visit Note: Visit Date: 04/10/2021 PCP: Kathyrn Drown, MD Referred by: Kathyrn Drown, MD  Subjective: Chief Complaint  Patient presents with   Lower Back - Pain   Middle Back - Pain   Left Leg - Pain   Right Leg - Pain   HPI: [name] is a 70 y.o. female who comes in today At the request of Dr. Arther Abbott for evaluation and management of chronic recalcitrant history of low back pain and thoracic pain but with severe worsening of her low back and bilateral hip and leg pain prior to March 2022.  She reports a long-term history of back and thoracic pain with a history of fibromyalgia.  No history of lumbar surgery.  She reports that sometime in late February early March she began having significant right-sided radicular type pain down the right leg into the foot and more of an L5 distribution.  She traditionally has had pain on both sides right and left.  Today she is actually reporting left more than right symptoms.  Overall she reports average 10 out of 10 bilateral low back and bilateral leg pain in the L5 distribution from a dermatomal standpoint.  Sitting in the office today she is a 5 out of 10.  It is an intermittent dull and throbbing nerve type pain.  Its worse with walking and standing and better at rest.  She currently is taking Tylenol but she has had intramuscular steroid injection through Dr. Aline Brochure with only mild relief.  During this episode of flareup since March she has had a good course of physical therapy at [facility] outpatient therapy.  This was from 11/07/2020 to 01/11/2021 and the notes can be reviewed in the chart.  She reports some increased ability of movement and flexibility from the therapy but no relief with her back and leg pain.  She has had no focal weakness no bowel or bladder symptoms no red flag complaints.  Dr. Aline Brochure did order MRI of the lumbar spine and this is reviewed  with the patient with spine models and imaging and reviewed below.  She does have some facet arthropathy at L4-5 with lateral recess narrowing bilaterally which is mild to moderate.  She has far lateral left disc osteophyte at L4-5 and L5-S1.  Review of Systems  Musculoskeletal:  Positive for back pain.       Bilateral hip and leg pain  All other systems reviewed and are negative. Otherwise per HPI.  Assessment & Plan: Visit Diagnoses:    ICD-10-CM   1. Lumbar radiculopathy  M54.16 Ambulatory referral to Physical Medicine Rehab    2. Radiculopathy due to lumbar intervertebral disc disorder  M51.16 Ambulatory referral to Physical Medicine Rehab    3. Bilateral stenosis of lateral recess of lumbar spine  M48.061 Ambulatory referral to Physical Medicine Rehab    4. Spondylosis without myelopathy or radiculopathy, lumbar region  M47.816 Ambulatory referral to Physical Medicine Rehab    5. Fibromyalgia  M79.7 Ambulatory referral to Physical Medicine Rehab    6. Myofascial pain syndrome  M79.18 Ambulatory referral to Physical Medicine Rehab    7. Chronic midline thoracic back pain  M54.6 Ambulatory referral to Physical Medicine Rehab   G89.29        Plan: Findings:  Chronic long-term history of thoracic and low back pain likely a combination of myofascial and fibromyalgia pain and arthritic changes  of the spine.  Chronic severe pain since March with intermittent right more than left and left more than right radicular leg pain in L5 distribution.  MRI evidence of lateral recess narrowing at L4-5 but without high-grade stenosis or nerve compression.  Disc osteophytes on the left far lateral at L4-5 and L5-S1 probably account for her chronic long-term left-sided symptoms.  She has tried and failed conservative care including time, medication management, activity modification and physical therapy for 6 weeks.  The neck step is a diagnostic and hopefully therapeutic bilateral L5 transforaminal  epidural steroid injection with fluoroscopic guidance.  Depending on relief would look at regrouping with medication changes versus repeat injection in the future if it was successful in the last for a good length of time.  They have had physical therapy and continue with directed home exercise program.  Current medication management is not beneficial in increasing their functional status.  Please note that procedures are done as part of a comprehensive multimodal orthopedic and pain management program with access to in-house orthopedics, spine surgery and physical therapy as well as access to [place] biopsychosocial counseling if needed.     Meds & Orders: No orders of the defined types were placed in this encounter.   Orders Placed This Encounter  Procedures   Ambulatory referral to Physical Medicine Rehab     Follow-up: Return for Bilateral L5 transforaminal epidural steroid injection.   Procedures: No procedures performed      Clinical History: MRI LUMBAR SPINE WITHOUT CONTRAST   TECHNIQUE: Multiplanar, multisequence MR imaging of the lumbar spine was performed. No intravenous contrast was administered.   COMPARISON:  Lumbar spine x-rays dated October 23, 2020. MRI lumbar spine dated January 08, 2007.   FINDINGS: Segmentation:  Standard.   Alignment:  Unchanged trace retrolisthesis at L2-L3.   Vertebrae: No acute fracture, evidence of discitis, or bone lesion. Chronic mild T11 superior endplate compression deformity, new since 2008. L1 hemangioma has increased in size since the prior study.   Conus medullaris and cauda equina: Conus extends to the L1-L2 level. Conus and cauda equina appear normal.   Paraspinal and other soft tissues: Negative.   Disc levels:   T12-L1:  Unchanged tiny central disc protrusion.  No stenosis.   L1-L2:  Unchanged minimal disc bulging.  No stenosis.   L2-L3: Slightly progressive mild disc bulging. New mild bilateral facet  arthropathy. No stenosis.   L3-L4: Unchanged mild foraminal disc bulging. New mild bilateral facet arthropathy. No stenosis.   L4-L5: Unchanged mild disc bulging with progressive left far lateral disc osteophyte complex encroaching on the exited left L4 nerve root. No stenosis.   L5-S1: New small left far lateral disc osteophyte complex encroaching on the exiting left L5 nerve root. Progressive mild bilateral facet arthropathy. No stenosis.   IMPRESSION: 1. Mild multilevel lumbar spondylosis as described above, progressed since 2008. New left far lateral disc osteophyte complexes at L4-L5 and L5-S1 encroaching on the exited left L4 and L5 nerve roots. No stenosis.     Electronically Signed   By: Titus Dubin M.D.   On: 03/21/2021 10:20   She reports that she has never smoked. She has never used smokeless tobacco. No results for input(s): HGBA1C, LABURIC in the last 8760 hours.  Objective:  VS:  HT:    WT:   BMI:     BP:139/66  HR:71bpm  TEMP: ( )  RESP:  Physical Exam Vitals and nursing note reviewed.  Constitutional:  General: She is not in acute distress.    Appearance: Normal appearance. She is well-developed. She is obese. She is not ill-appearing.  HENT:     Head: Normocephalic and atraumatic.  Eyes:     Conjunctiva/sclera: Conjunctivae normal.     Pupils: Pupils are equal, round, and reactive to light.  Cardiovascular:     Rate and Rhythm: Normal rate.     Pulses: Normal pulses.  Pulmonary:     Effort: Pulmonary effort is normal.  Musculoskeletal:        General: Tenderness present.     Right lower leg: No edema.     Left lower leg: No edema.     Comments: Patient is slow to rise from a seated position to full extension with some back pain with extension and facet loading.  She has positive tender points without active trigger points.  Tight musculature of the quadratus lumborum.  She does have mild tenderness across the PSIS and greater trochanters.   No pain with hip rotation.  She has an equivocal slump test on the left with tight hamstring more than the right.  She has good strength in the lower extremities bilaterally.  Dysesthesias in the bilateral L5 distribution.  Skin:    General: Skin is warm and dry.     Findings: No erythema or rash.  Neurological:     General: No focal deficit present.     Mental Status: She is alert and oriented to person, place, and time.     Cranial Nerves: No cranial nerve deficit.     Sensory: Sensory deficit present.     Motor: No weakness or abnormal muscle tone.     Coordination: Coordination normal.     Gait: Gait normal.     Deep Tendon Reflexes: Reflexes normal.  Psychiatric:        Mood and Affect: Mood normal.        Behavior: Behavior normal.    Ortho Exam  Imaging: No results found.  Past Medical/Family/Surgical/Social History: Medications & Allergies reviewed per EMR, new medications updated. Patient Active Problem List   Diagnosis Date Noted   Acute midline low back pain with right-sided sciatica 10/03/2020   Bilateral sciatica 10/03/2020   Crohn's disease ([place]) 06/16/2018   Chronic thoracic back pain 06/16/2018   Closed fracture of proximal end of left fibula 10/20/17  12/02/2017   Thyroid nodule 09/23/2017   Edema of left lower extremity 08/25/2017   Thunderclap headache 09/06/2015   Vision changes 09/06/2015   Dizziness 09/06/2015   New onset of headaches after age 43 09/06/2015   B12 deficiency 07/09/2015   Acute bilateral lower abdominal pain 12/08/2014   Osteoporosis 11/20/2014   Chronic back pain 11/10/2014   GERD (gastroesophageal reflux disease) 06/16/2014   Impaired fasting glucose 03/29/2014   Fibromyalgia 12/09/2013   Crohn's ileitis ([place]) 01/17/2011   Iron deficiency anemia 04/10/2010   Past Medical History:  Diagnosis Date   Arthritis    B12 deficiency 07/09/2015   Crohn disease ([place]) JULY 2011 FEDA   PERSISTENT TI ULCERS seen on CE, NO NSAID USE    Endometriosis    HISTORY  LEADING TO HYSTERECTOMY   Fibromyalgia    Fibromyalgia 12/09/2013   Gastric ulcer    GERD (gastroesophageal reflux disease)    Headache    Helicobacter pylori gastritis    HISTORY   History of hiatal hernia    Hives    Iron (Fe) deficiency anemia 2009   JAN 2012 NL  HB & FERRITIN   Obesity    Osteoporosis 11/20/2014   Plantar fasciitis    Family History  Problem Relation Age of Onset   Diabetes Mother    Cancer Father    Diabetes Father    Diabetes Sister    Diabetes Brother    Colon polyps Neg Hx    Colon cancer Neg Hx    Stroke Neg Hx    Past Surgical History:  Procedure Laterality Date   ABDOMINAL HYSTERECTOMY     CATARACT EXTRACTION W/PHACO Right 12/05/2015   Procedure: CATARACT EXTRACTION PHACO AND INTRAOCULAR LENS PLACEMENT ([place]);  Surgeon: Birder Robson, MD;  Location: [facility] ORS;  Service: Ophthalmology;  Laterality: Right;  [place] 00:59.9 AP% 21.9 CDE 13.10 fluid pack lot # [id number]   CATARACT EXTRACTION W/PHACO Left 06/24/2016   Procedure: CATARACT EXTRACTION PHACO AND INTRAOCULAR LENS PLACEMENT LEFT EYE; CDE:  8.33;  Surgeon: Tonny Branch, MD;  Location: [facility];  Service: Ophthalmology;  Laterality: Left;   COLONOSCOPY     ESOPHAGOGASTRODUODENOSCOPY  10/14/2007   Esophagus showed 2 cm pink tongue seen extending from the GE junction.  Biopsies obtained via cold forceps.  Otherwise the  esophagus was without erosions, mass, ulceration, or stricture / . Large hiatal hernia pouch with linear erosions, and two 3-6 mmulcers seen in the pouch.  Biopsies obtained via cold forceps to  evaluate for H. pylori gastritis or malignancy   Ileocolonoscopy  10/14/2007   Multiple 1-3 mm terminal ileum ulcers biopsied  The ulcers seemed to only involve the last 5 cm of her terminal ileum, and 10 cm of her ileum was visualized.  Otherwise the colon  was without polyps, masses, diverticula, or AVMs.  She had a normal retroflexed view of the rectum   OVARIAN CYST  REMOVAL     TOTAL ABDOMINAL HYSTERECTOMY W/ BILATERAL SALPINGOOPHORECTOMY     UPPER GASTROINTESTINAL ENDOSCOPY  JULY 2011 DIARRHEA/FEDA    Bx-H.PYLORI GASTRITIS, NL DUODENUM   Social History   Occupational History   Occupation: Retired  Tobacco Use   Smoking status: Never   Smokeless tobacco: Never  Vaping Use   Vaping Use: Never used  Substance and Sexual Activity   Alcohol use: No   Drug use: No   Sexual activity: Yes    Birth control/protection: Surgical

## 2021-04-12 ENCOUNTER — Encounter: Payer: Self-pay | Admitting: Physical Medicine and Rehabilitation

## 2021-04-12 NOTE — Addendum Note (Signed)
Addended by: Raymondo Band on: 04/12/2021 08:42 AM   Modules accepted: Orders

## 2021-04-13 ENCOUNTER — Telehealth: Payer: Self-pay | Admitting: Physical Medicine and Rehabilitation

## 2021-04-13 DIAGNOSIS — K449 Diaphragmatic hernia without obstruction or gangrene: Secondary | ICD-10-CM | POA: Diagnosis not present

## 2021-04-13 DIAGNOSIS — R0789 Other chest pain: Secondary | ICD-10-CM | POA: Diagnosis not present

## 2021-04-13 NOTE — Telephone Encounter (Signed)
Patient called asked for a call back concerning her appointment with Dr Ernestina Patches. The number to contact patient is [phone]

## 2021-04-17 ENCOUNTER — Telehealth: Payer: Self-pay | Admitting: Physical Medicine and Rehabilitation

## 2021-04-17 NOTE — Telephone Encounter (Signed)
Pt called requesting a call back to set an appt for injection. Pt phone number is [phone].

## 2021-04-18 NOTE — Telephone Encounter (Signed)
Pt called requesting a call back regarding an injection

## 2021-04-19 NOTE — Telephone Encounter (Signed)
Pt called returning a call to set up an apt for an injection in her back

## 2021-04-20 NOTE — Telephone Encounter (Signed)
Pt called stating she was at work for the first missed call but would like a CB today if possible.   Before 3 p.m. # [phone] After 3:30# [phone]

## 2021-04-23 ENCOUNTER — Telehealth: Payer: Self-pay | Admitting: Physical Medicine and Rehabilitation

## 2021-04-23 NOTE — Telephone Encounter (Signed)
Patient called. She would like [name] to call her.

## 2021-05-08 ENCOUNTER — Ambulatory Visit: Payer: Self-pay

## 2021-05-08 ENCOUNTER — Other Ambulatory Visit: Payer: Self-pay

## 2021-05-08 ENCOUNTER — Ambulatory Visit (INDEPENDENT_AMBULATORY_CARE_PROVIDER_SITE_OTHER): Payer: Medicare HMO | Admitting: Physical Medicine and Rehabilitation

## 2021-05-08 ENCOUNTER — Encounter: Payer: Self-pay | Admitting: Physical Medicine and Rehabilitation

## 2021-05-08 VITALS — BP 145/79 | HR 76

## 2021-05-08 DIAGNOSIS — M5416 Radiculopathy, lumbar region: Secondary | ICD-10-CM | POA: Diagnosis not present

## 2021-05-08 MED ORDER — METHYLPREDNISOLONE ACETATE 80 MG/ML IJ SUSP
80.0000 mg | Freq: Once | INTRAMUSCULAR | Status: AC
  Administered 2021-05-08: 80 mg

## 2021-05-08 NOTE — Progress Notes (Signed)
[name] - 70 y.o. female MRN [id number]  Date of birth: [birth year]  Office Visit Note: Visit Date: 05/08/2021 PCP: Kathyrn Drown, MD Referred by: Kathyrn Drown, MD  Subjective: Chief Complaint  Patient presents with   Lower Back - Pain   Left Leg - Pain   Right Leg - Pain   HPI:  [name] is a 70 y.o. female who comes in today at the request of Barnet Pall, FNP for planned Bilateral L5-S1 Lumbar Transforaminal epidural steroid injection with fluoroscopic guidance.  The patient has failed conservative care including home exercise, medications, time and activity modification.  This injection will be diagnostic and hopefully therapeutic.  Please see requesting physician notes for further details and justification.   ROS Otherwise per HPI.  Assessment & Plan: Visit Diagnoses:    ICD-10-CM   1. Lumbar radiculopathy  M54.16 XR C-ARM NO REPORT    Epidural Steroid injection    methylPREDNISolone acetate (DEPO-MEDROL) injection 80 mg      Plan: No additional findings.   Meds & Orders:  Meds ordered this encounter  Medications   methylPREDNISolone acetate (DEPO-MEDROL) injection 80 mg    Orders Placed This Encounter  Procedures   XR C-ARM NO REPORT   Epidural Steroid injection    Follow-up: Return if symptoms worsen or fail to improve.   Procedures: No procedures performed  Lumbosacral Transforaminal Epidural Steroid Injection - Sub-Pedicular Approach with Fluoroscopic Guidance  Patient: [name]      Date of Birth: 10-21-1950 MRN: [id number] PCP: Kathyrn Drown, MD      Visit Date: 05/08/2021   Universal Protocol:    Date/Time: 05/08/2021  Consent Given By: the patient  Position: PRONE  Additional Comments: Vital signs were monitored before and after the procedure. Patient was prepped and draped in the usual sterile fashion. The correct patient, procedure, and site was verified.   Injection Procedure Details:   Procedure diagnoses: Lumbar  radiculopathy [M54.16]    Meds Administered:  Meds ordered this encounter  Medications   methylPREDNISolone acetate (DEPO-MEDROL) injection 80 mg    Laterality: Bilateral  Location/Site: L5  Needle:5.0 in., 22 ga.  Short bevel or Quincke spinal needle  Needle Placement: Transforaminal  Findings:    -Comments: Excellent flow of contrast along the nerve, nerve root and into the epidural space.  Procedure Details: After squaring off the end-plates to get a true AP view, the C-arm was positioned so that an oblique view of the foramen as noted above was visualized. The target area is just inferior to the "nose of the scotty dog" or sub pedicular. The soft tissues overlying this structure were infiltrated with 2-3 ml. of 1% Lidocaine without Epinephrine.  The spinal needle was inserted toward the target using a "trajectory" view along the fluoroscope beam.  Under AP and lateral visualization, the needle was advanced so it did not puncture dura and was located close the 6 O'Clock position of the pedical in AP tracterory. Biplanar projections were used to confirm position. Aspiration was confirmed to be negative for CSF and/or blood. A 1-2 ml. volume of Isovue-250 was injected and flow of contrast was noted at each level. Radiographs were obtained for documentation purposes.   After attaining the desired flow of contrast documented above, a 0.5 to 1.0 ml test dose of 0.25% Marcaine was injected into each respective transforaminal space.  The patient was observed for 90 seconds post injection.  After no sensory deficits were reported, and  normal lower extremity motor function was noted,   the above injectate was administered so that equal amounts of the injectate were placed at each foramen (level) into the transforaminal epidural space.   Additional Comments:  The patient tolerated the procedure well Dressing: 2 x 2 sterile gauze and Band-Aid    Post-procedure details: Patient was observed  during the procedure. Post-procedure instructions were reviewed.  Patient left the clinic in stable condition.    Clinical History: MRI LUMBAR SPINE WITHOUT CONTRAST   TECHNIQUE: Multiplanar, multisequence MR imaging of the lumbar spine was performed. No intravenous contrast was administered.   COMPARISON:  Lumbar spine x-rays dated October 23, 2020. MRI lumbar spine dated January 08, 2007.   FINDINGS: Segmentation:  Standard.   Alignment:  Unchanged trace retrolisthesis at L2-L3.   Vertebrae: No acute fracture, evidence of discitis, or bone lesion. Chronic mild T11 superior endplate compression deformity, new since 2008. L1 hemangioma has increased in size since the prior study.   Conus medullaris and cauda equina: Conus extends to the L1-L2 level. Conus and cauda equina appear normal.   Paraspinal and other soft tissues: Negative.   Disc levels:   T12-L1:  Unchanged tiny central disc protrusion.  No stenosis.   L1-L2:  Unchanged minimal disc bulging.  No stenosis.   L2-L3: Slightly progressive mild disc bulging. New mild bilateral facet arthropathy. No stenosis.   L3-L4: Unchanged mild foraminal disc bulging. New mild bilateral facet arthropathy. No stenosis.   L4-L5: Unchanged mild disc bulging with progressive left far lateral disc osteophyte complex encroaching on the exited left L4 nerve root. No stenosis.   L5-S1: New small left far lateral disc osteophyte complex encroaching on the exiting left L5 nerve root. Progressive mild bilateral facet arthropathy. No stenosis.   IMPRESSION: 1. Mild multilevel lumbar spondylosis as described above, progressed since 2008. New left far lateral disc osteophyte complexes at L4-L5 and L5-S1 encroaching on the exited left L4 and L5 nerve roots. No stenosis.     Electronically Signed   By: Titus Dubin M.D.   On: 03/21/2021 10:20     Objective:  VS:  HT:    WT:   BMI:     BP:(!) 145/79  HR:76bpm  TEMP: ( )   RESP:  Physical Exam Vitals and nursing note reviewed.  Constitutional:      General: She is not in acute distress.    Appearance: Normal appearance. She is obese. She is not ill-appearing.  HENT:     Head: Normocephalic and atraumatic.     Right Ear: External ear normal.     Left Ear: External ear normal.  Eyes:     Extraocular Movements: Extraocular movements intact.  Cardiovascular:     Rate and Rhythm: Normal rate.     Pulses: Normal pulses.  Pulmonary:     Effort: Pulmonary effort is normal. No respiratory distress.  Abdominal:     General: There is no distension.     Palpations: Abdomen is soft.  Musculoskeletal:        General: Tenderness present.     Cervical back: Neck supple.     Right lower leg: No edema.     Left lower leg: No edema.     Comments: Patient has good distal strength with no pain over the greater trochanters.  No clonus or focal weakness.  Skin:    Findings: No erythema, lesion or rash.  Neurological:     General: No focal deficit present.  Mental Status: She is alert and oriented to person, place, and time.     Sensory: No sensory deficit.     Motor: No weakness or abnormal muscle tone.     Coordination: Coordination normal.  Psychiatric:        Mood and Affect: Mood normal.        Behavior: Behavior normal.     Imaging: No results found.

## 2021-05-08 NOTE — Progress Notes (Signed)
Pt state lower back pain that travels down both legs. Pt state walking, bending and standing makes the pain worse. Pt state she takes over the counter pain meds and uses heating to help ease her pain.  Numeric Pain Rating Scale and Functional Assessment Average Pain 4   In the last MONTH (on 0-10 scale) has pain interfered with the following?  1. General activity like being  able to carry out your everyday physical activities such as walking, climbing stairs, carrying groceries, or moving a chair?  Rating(8)   +Driver, -BT, -Dye Allergies.

## 2021-05-08 NOTE — Patient Instructions (Signed)

## 2021-05-24 NOTE — Procedures (Signed)
Lumbosacral Transforaminal Epidural Steroid Injection - Sub-Pedicular Approach with Fluoroscopic Guidance  Patient: [name]      Date of Birth: [birth year] MRN: [id number] PCP: Kathyrn Drown, MD      Visit Date: 05/08/2021   Universal Protocol:    Date/Time: 05/08/2021  Consent Given By: the patient  Position: PRONE  Additional Comments: Vital signs were monitored before and after the procedure. Patient was prepped and draped in the usual sterile fashion. The correct patient, procedure, and site was verified.   Injection Procedure Details:   Procedure diagnoses: Lumbar radiculopathy [M54.16]    Meds Administered:  Meds ordered this encounter  Medications   methylPREDNISolone acetate (DEPO-MEDROL) injection 80 mg    Laterality: Bilateral  Location/Site: L5  Needle:5.0 in., 22 ga.  Short bevel or Quincke spinal needle  Needle Placement: Transforaminal  Findings:    -Comments: Excellent flow of contrast along the nerve, nerve root and into the epidural space.  Procedure Details: After squaring off the end-plates to get a true AP view, the C-arm was positioned so that an oblique view of the foramen as noted above was visualized. The target area is just inferior to the "nose of the scotty dog" or sub pedicular. The soft tissues overlying this structure were infiltrated with 2-3 ml. of 1% Lidocaine without Epinephrine.  The spinal needle was inserted toward the target using a "trajectory" view along the fluoroscope beam.  Under AP and lateral visualization, the needle was advanced so it did not puncture dura and was located close the 6 O'Clock position of the pedical in AP tracterory. Biplanar projections were used to confirm position. Aspiration was confirmed to be negative for CSF and/or blood. A 1-2 ml. volume of Isovue-250 was injected and flow of contrast was noted at each level. Radiographs were obtained for documentation purposes.   After attaining the desired flow  of contrast documented above, a 0.5 to 1.0 ml test dose of 0.25% Marcaine was injected into each respective transforaminal space.  The patient was observed for 90 seconds post injection.  After no sensory deficits were reported, and normal lower extremity motor function was noted,   the above injectate was administered so that equal amounts of the injectate were placed at each foramen (level) into the transforaminal epidural space.   Additional Comments:  The patient tolerated the procedure well Dressing: 2 x 2 sterile gauze and Band-Aid    Post-procedure details: Patient was observed during the procedure. Post-procedure instructions were reviewed.  Patient left the clinic in stable condition.

## 2021-05-29 ENCOUNTER — Ambulatory Visit: Payer: Medicare HMO | Admitting: Physical Medicine and Rehabilitation

## 2021-06-06 ENCOUNTER — Telehealth: Payer: Self-pay | Admitting: Physical Medicine and Rehabilitation

## 2021-06-06 NOTE — Telephone Encounter (Signed)
Pt called to cancel appt. Please call pt at [phone]

## 2021-06-08 ENCOUNTER — Other Ambulatory Visit (HOSPITAL_COMMUNITY): Payer: Self-pay

## 2021-06-08 DIAGNOSIS — D500 Iron deficiency anemia secondary to blood loss (chronic): Secondary | ICD-10-CM

## 2021-06-08 DIAGNOSIS — E538 Deficiency of other specified B group vitamins: Secondary | ICD-10-CM

## 2021-06-08 DIAGNOSIS — E559 Vitamin D deficiency, unspecified: Secondary | ICD-10-CM

## 2021-06-11 ENCOUNTER — Inpatient Hospital Stay (HOSPITAL_COMMUNITY): Payer: Medicare HMO | Attending: Hematology

## 2021-06-11 ENCOUNTER — Other Ambulatory Visit: Payer: Self-pay

## 2021-06-11 DIAGNOSIS — E538 Deficiency of other specified B group vitamins: Secondary | ICD-10-CM | POA: Diagnosis not present

## 2021-06-11 DIAGNOSIS — D500 Iron deficiency anemia secondary to blood loss (chronic): Secondary | ICD-10-CM | POA: Diagnosis not present

## 2021-06-11 DIAGNOSIS — K5090 Crohn's disease, unspecified, without complications: Secondary | ICD-10-CM | POA: Insufficient documentation

## 2021-06-11 DIAGNOSIS — E559 Vitamin D deficiency, unspecified: Secondary | ICD-10-CM | POA: Diagnosis not present

## 2021-06-11 DIAGNOSIS — Z79899 Other long term (current) drug therapy: Secondary | ICD-10-CM | POA: Insufficient documentation

## 2021-06-11 DIAGNOSIS — K909 Intestinal malabsorption, unspecified: Secondary | ICD-10-CM | POA: Insufficient documentation

## 2021-06-11 LAB — VITAMIN D 25 HYDROXY (VIT D DEFICIENCY, FRACTURES): Vit D, 25-Hydroxy: 49.72 ng/mL (ref 30–100)

## 2021-06-11 LAB — COMPREHENSIVE METABOLIC PANEL
ALT: 20 U/L (ref 0–44)
AST: 17 U/L (ref 15–41)
Albumin: 4.2 g/dL (ref 3.5–5.0)
Alkaline Phosphatase: 96 U/L (ref 38–126)
Anion gap: 7 (ref 5–15)
BUN: 22 mg/dL (ref 8–23)
CO2: 23 mmol/L (ref 22–32)
Calcium: 8.8 mg/dL — ABNORMAL LOW (ref 8.9–10.3)
Chloride: 107 mmol/L (ref 98–111)
Creatinine, Ser: 0.62 mg/dL (ref 0.44–1.00)
GFR, Estimated: >60 mL/min (ref >60)
Glucose, Bld: 101 mg/dL — ABNORMAL HIGH (ref 70–99)
Potassium: 3.7 mmol/L (ref 3.5–5.1)
Sodium: 137 mmol/L (ref 135–145)
Total Bilirubin: 0.6 mg/dL (ref 0.3–1.2)
Total Protein: 7.4 g/dL (ref 6.5–8.1)

## 2021-06-11 LAB — CBC WITH DIFFERENTIAL/PLATELET
Abs Immature Granulocytes: 0.02 10*3/uL (ref 0.00–0.07)
Basophils Absolute: 0.1 10*3/uL (ref 0.0–0.1)
Basophils Relative: 1 %
Eosinophils Absolute: 0.1 10*3/uL (ref 0.0–0.5)
Eosinophils Relative: 1 %
HCT: 39.3 % (ref 36.0–46.0)
Hemoglobin: 13.4 g/dL (ref 12.0–15.0)
Immature Granulocytes: 0 %
Lymphocytes Relative: 20 %
Lymphs Abs: 1.7 10*3/uL (ref 0.7–4.0)
MCH: 30.5 pg (ref 26.0–34.0)
MCHC: 34.1 g/dL (ref 30.0–36.0)
MCV: 89.5 fL (ref 80.0–100.0)
Monocytes Absolute: 0.6 10*3/uL (ref 0.1–1.0)
Monocytes Relative: 7 %
Neutro Abs: 5.9 10*3/uL (ref 1.7–7.7)
Neutrophils Relative %: 71 %
Platelets: 221 10*3/uL (ref 150–400)
RBC: 4.39 MIL/uL (ref 3.87–5.11)
RDW: 12.7 % (ref 11.5–15.5)
WBC: 8.3 10*3/uL (ref 4.0–10.5)
nRBC: 0 % (ref 0.0–0.2)

## 2021-06-11 LAB — VITAMIN B12: Vitamin B-12: 158 pg/mL — ABNORMAL LOW (ref 180–914)

## 2021-06-11 LAB — IRON AND TIBC
Iron: 32 ug/dL (ref 28–170)
Saturation Ratios: 11 % (ref 10.4–31.8)
TIBC: 289 ug/dL (ref 250–450)
UIBC: 257 ug/dL

## 2021-06-11 LAB — LACTATE DEHYDROGENASE: LDH: 150 U/L (ref 98–192)

## 2021-06-11 LAB — FERRITIN: Ferritin: 95 ng/mL (ref 11–307)

## 2021-06-12 ENCOUNTER — Ambulatory Visit: Payer: Medicare HMO | Admitting: Physical Medicine and Rehabilitation

## 2021-06-12 NOTE — Progress Notes (Addendum)
Virtual Visit via Telephone Note [facility]  I connected with [name]  on 06/13/2021 at 9:27 AM by telephone and verified that I am speaking with the correct person using two identifiers.  Location: Patient: Home Provider: [facility]   I discussed the limitations, risks, security and privacy concerns of performing an evaluation and management service by telephone and the availability of in person appointments. I also discussed with the patient that there may be a patient responsible charge related to this service. The patient expressed understanding and agreed to proceed.  REASON FOR VISIT: Follow-up for IDA PRIOR THERAPY: None CURRENT THERAPY: Intermittent Feraheme   HISTORY OF PRESENT ILLNESS: [name] follows at our clinic for iron deficiency anemia.  She was last evaluated via telemedicine visit with NP Faythe Casa on 03/07/2021.  At today's visit, she reports feeling fair.  She denies any recent hospitalizations, surgeries, or changes in her baseline health status.  She reports that she has been starting to notice some progressive fatigue again, and is feeling "like she might need iron."  She has some chronic dyspnea on exertion which she relates to her weight and deconditioning.  She denies any pica, restless legs, chest pain, lightheadedness, or syncope.  Her Crohn's disease remains relatively well controlled, and she reports that she has not had any recent flareups.  She denies any blood loss such as hematemesis, hematochezia, melena, or epistaxis.  She continues to take her weekly vitamin D.  She has not been receiving monthly B12 injections from her PCP.  She reports 70% energy and 100% appetite.  She is maintaining stable weight at this time.     OBSERVATIONS/OBJECTIVE: Review of Systems  Constitutional:  Positive for malaise/fatigue. Negative for chills, diaphoresis, fever and weight loss.  Respiratory:  Negative for cough  and shortness of breath.   Cardiovascular:  Negative for chest pain and palpitations.  Gastrointestinal:  Negative for abdominal pain, blood in stool, melena, nausea and vomiting.  Neurological:  Negative for dizziness and headaches.    PHYSICAL EXAM (per limitations of virtual telephone visit): The patient is alert and oriented x 3, exhibiting adequate mentation, good mood, and ability to speak in full sentences and execute sound judgement.   ASSESSMENT & PLAN: 1.  Iron deficiency anemia  -Secondary to chronic blood loss and malabsorption in the setting of inflammatory bowel disease - Most recent IV iron with Venofer 300 mg x 2 doses on 03/14/2021 and 03/28/2021 - No hematemesis, hematochezia, or melena - Symptomatic with fatigue - Most recent labs (06/11/2021): Hgb 13.4, ferritin 95, TIBC 289, iron saturation 11% - PLAN: Recommend IV Venofer 300 mg x 2 doses - Repeat labs and RTC in 4 months.  2.  B12 deficiency -She has not yet restarted B12 injections from her PCP, despite being instructed to do so at her last appointment - Most recent B12 (06/11/2021) is low at 158 - PLAN: Encouraged patient to follow-up with PCP for monthly B12 injections.  She is aware that she can receive her B12 injections at our clinic if she prefers.  3.  Vitamin D deficiency -Currently taking vitamin D 50,000 units weekly  - Most recent vitamin D (06/11/2021) is normal at 49.72 - PLAN: Continue weekly vitamin D  4.  Crohn's disease - No active treatment - Last flare was 3+ years ago   FOLLOW UP INSTRUCTIONS: IV Venofer 300 mg x 2 doses Labs (CBC, iron/TIBC, ferritin, B12/methylmalonic acid, vitamin D)  in 4 months RTC after labs    I discussed the assessment and treatment plan with the patient. The patient was provided an opportunity to ask questions and all were answered. The patient agreed with the plan and demonstrated an understanding of the instructions.   The patient was advised to call back or  seek an in-person evaluation if the symptoms worsen or if the condition fails to improve as anticipated.  I provided 13 minutes of non-face-to-face time during this encounter.   Harriett Rush, PA-C 9:48 AM on 06/13/2021

## 2021-06-13 ENCOUNTER — Inpatient Hospital Stay (HOSPITAL_BASED_OUTPATIENT_CLINIC_OR_DEPARTMENT_OTHER): Payer: Medicare HMO | Admitting: Physician Assistant

## 2021-06-13 ENCOUNTER — Encounter (HOSPITAL_COMMUNITY): Payer: Self-pay | Admitting: Physician Assistant

## 2021-06-13 DIAGNOSIS — D500 Iron deficiency anemia secondary to blood loss (chronic): Secondary | ICD-10-CM

## 2021-06-13 DIAGNOSIS — E559 Vitamin D deficiency, unspecified: Secondary | ICD-10-CM

## 2021-06-13 DIAGNOSIS — E538 Deficiency of other specified B group vitamins: Secondary | ICD-10-CM

## 2021-06-19 ENCOUNTER — Ambulatory Visit (HOSPITAL_COMMUNITY): Payer: Medicare HMO | Admitting: Physician Assistant

## 2021-06-19 DIAGNOSIS — E669 Obesity, unspecified: Secondary | ICD-10-CM | POA: Diagnosis not present

## 2021-06-19 DIAGNOSIS — D509 Iron deficiency anemia, unspecified: Secondary | ICD-10-CM | POA: Diagnosis not present

## 2021-06-19 DIAGNOSIS — K449 Diaphragmatic hernia without obstruction or gangrene: Secondary | ICD-10-CM | POA: Diagnosis not present

## 2021-06-20 ENCOUNTER — Telehealth: Payer: Self-pay

## 2021-06-20 NOTE — Telephone Encounter (Signed)
Patient calls this morning requesting a referral to cardiology for chest discomfort going on since September, she stated she has been seen for her hiatal hernia at [facility] and they advised that she have a cardiology referral to follow up. . Please advise

## 2021-06-20 NOTE — Telephone Encounter (Signed)
Patient advised per drs notes and recommendations, she denies any acute chest pain or discomfort and verbalizes understanding of utilization of ER as needed. Scheduled appt for next week .

## 2021-06-20 NOTE — Telephone Encounter (Signed)
1.  Cardiology referral can be done but there is a process 2.  Without her being seen it could take weeks for them to see her I recommend an office visit next week for evaluation here plus EKG if she has chest discomfort over the weekend I recommend ER visit

## 2021-06-27 ENCOUNTER — Encounter (HOSPITAL_COMMUNITY): Payer: Self-pay

## 2021-06-27 ENCOUNTER — Other Ambulatory Visit: Payer: Self-pay

## 2021-06-27 ENCOUNTER — Ambulatory Visit (INDEPENDENT_AMBULATORY_CARE_PROVIDER_SITE_OTHER): Payer: Medicare HMO | Admitting: Family Medicine

## 2021-06-27 ENCOUNTER — Telehealth: Payer: Self-pay | Admitting: Family Medicine

## 2021-06-27 ENCOUNTER — Emergency Department (HOSPITAL_COMMUNITY): Payer: Medicare HMO

## 2021-06-27 ENCOUNTER — Emergency Department (HOSPITAL_COMMUNITY)
Admission: EM | Admit: 2021-06-27 | Discharge: 2021-06-27 | Disposition: A | Discharge status: Home / Self Care | Payer: Medicare HMO | Attending: Emergency Medicine | Admitting: Emergency Medicine

## 2021-06-27 VITALS — BP 137/77 | HR 74 | Temp 96.8°F | Wt 211.6 lb

## 2021-06-27 DIAGNOSIS — R0789 Other chest pain: Secondary | ICD-10-CM | POA: Diagnosis not present

## 2021-06-27 DIAGNOSIS — R0609 Other forms of dyspnea: Secondary | ICD-10-CM | POA: Diagnosis not present

## 2021-06-27 DIAGNOSIS — J9811 Atelectasis: Secondary | ICD-10-CM | POA: Diagnosis not present

## 2021-06-27 DIAGNOSIS — R072 Precordial pain: Secondary | ICD-10-CM | POA: Diagnosis not present

## 2021-06-27 DIAGNOSIS — R0602 Shortness of breath: Secondary | ICD-10-CM | POA: Diagnosis not present

## 2021-06-27 DIAGNOSIS — R079 Chest pain, unspecified: Secondary | ICD-10-CM | POA: Diagnosis not present

## 2021-06-27 DIAGNOSIS — R2242 Localized swelling, mass and lump, left lower limb: Secondary | ICD-10-CM | POA: Insufficient documentation

## 2021-06-27 DIAGNOSIS — K449 Diaphragmatic hernia without obstruction or gangrene: Secondary | ICD-10-CM | POA: Diagnosis not present

## 2021-06-27 LAB — COMPREHENSIVE METABOLIC PANEL
ALT: 19 U/L (ref 0–44)
AST: 16 U/L (ref 15–41)
Albumin: 3.9 g/dL (ref 3.5–5.0)
Alkaline Phosphatase: 99 U/L (ref 38–126)
Anion gap: 8 (ref 5–15)
BUN: 16 mg/dL (ref 8–23)
CO2: 23 mmol/L (ref 22–32)
Calcium: 8.5 mg/dL — ABNORMAL LOW (ref 8.9–10.3)
Chloride: 106 mmol/L (ref 98–111)
Creatinine, Ser: 0.64 mg/dL (ref 0.44–1.00)
GFR, Estimated: >60 mL/min (ref >60)
Glucose, Bld: 107 mg/dL — ABNORMAL HIGH (ref 70–99)
Potassium: 3.9 mmol/L (ref 3.5–5.1)
Sodium: 137 mmol/L (ref 135–145)
Total Bilirubin: 0.4 mg/dL (ref 0.3–1.2)
Total Protein: 7 g/dL (ref 6.5–8.1)

## 2021-06-27 LAB — CBC
HCT: 39.3 % (ref 36.0–46.0)
Hemoglobin: 12.8 g/dL (ref 12.0–15.0)
MCH: 29.4 pg (ref 26.0–34.0)
MCHC: 32.6 g/dL (ref 30.0–36.0)
MCV: 90.1 fL (ref 80.0–100.0)
Platelets: 216 10*3/uL (ref 150–400)
RBC: 4.36 MIL/uL (ref 3.87–5.11)
RDW: 12.7 % (ref 11.5–15.5)
WBC: 6 10*3/uL (ref 4.0–10.5)
nRBC: 0 % (ref 0.0–0.2)

## 2021-06-27 LAB — TROPONIN I (HIGH SENSITIVITY)
Troponin I (High Sensitivity): 2 ng/L (ref <18)
Troponin I (High Sensitivity): <2 ng/L (ref <18)

## 2021-06-27 NOTE — Telephone Encounter (Signed)
Patient was seen today and wanting a referral sent in for  heart doctor in [place] or [place]

## 2021-06-27 NOTE — ED Provider Notes (Signed)
[facility] EMERGENCY DEPARTMENT Provider Note   CSN: [id number] Arrival date & time: 06/27/21  1016     History Chief Complaint  Patient presents with   Chest Pain    [name] is a 70 y.o. female.  Patient presents to the ED today from her PCP office for ACS rule out. Patient reports having central and left sided chest pain/pressure/discomfort intermittently since September. Noted to have enlarging hiatal hernia and referred to general surgery. Seen by [facility] on 06/19/21, started on omeprazole with recommendation for cardiology referral (prompting PCP visit today). Patient with intermittent shortness of breath, not activity dependent. Patient does not appear dyspneic at present, respirations of 17, even, and non-labored.  The history is provided by the patient and medical records. No language interpreter was used.  Chest Pain Pain location:  Substernal area and L chest Pain quality: pressure and tightness   Pain radiates to:  Does not radiate Pain severity:  Moderate Onset quality:  Gradual Duration:  2 months Timing:  Intermittent Progression:  Waxing and waning Chronicity:  New Associated symptoms: nausea   Associated symptoms: no abdominal pain, no cough and no shortness of breath   Risk factors: obesity       Past Medical History:  Diagnosis Date   Arthritis    B12 deficiency 07/09/2015   Crohn disease ([place]) JULY 2011 FEDA   PERSISTENT TI ULCERS seen on CE, NO NSAID USE   Endometriosis    HISTORY  LEADING TO HYSTERECTOMY   Fibromyalgia    Fibromyalgia 12/09/2013   Gastric ulcer    GERD (gastroesophageal reflux disease)    Headache    Helicobacter pylori gastritis    HISTORY   History of hiatal hernia    Hives    Iron (Fe) deficiency anemia 2009   JAN 2012 NL HB & FERRITIN   Obesity    Osteoporosis 11/20/2014   Plantar fasciitis     Patient Active Problem List   Diagnosis Date Noted   Acute midline low back pain with right-sided sciatica 10/03/2020    Bilateral sciatica 10/03/2020   Crohn's disease ([place]) 06/16/2018   Chronic thoracic back pain 06/16/2018   Closed fracture of proximal end of left fibula 10/20/17  12/02/2017   Thyroid nodule 09/23/2017   Edema of left lower extremity 08/25/2017   Thunderclap headache 09/06/2015   Vision changes 09/06/2015   Dizziness 09/06/2015   New onset of headaches after age 63 09/06/2015   B12 deficiency 07/09/2015   Acute bilateral lower abdominal pain 12/08/2014   Osteoporosis 11/20/2014   Chronic back pain 11/10/2014   GERD (gastroesophageal reflux disease) 06/16/2014   Impaired fasting glucose 03/29/2014   Fibromyalgia 12/09/2013   Crohn's ileitis ([place]) 01/17/2011   Iron deficiency anemia 04/10/2010    Past Surgical History:  Procedure Laterality Date   ABDOMINAL HYSTERECTOMY     CATARACT EXTRACTION W/PHACO Right 12/05/2015   Procedure: CATARACT EXTRACTION PHACO AND INTRAOCULAR LENS PLACEMENT ([place]);  Surgeon: Birder Robson, MD;  Location: [facility] ORS;  Service: Ophthalmology;  Laterality: Right;  [place] 00:59.9 AP% 21.9 CDE 13.10 fluid pack lot # [id number]   CATARACT EXTRACTION W/PHACO Left 06/24/2016   Procedure: CATARACT EXTRACTION PHACO AND INTRAOCULAR LENS PLACEMENT LEFT EYE; CDE:  8.33;  Surgeon: Tonny Branch, MD;  Location: [facility];  Service: Ophthalmology;  Laterality: Left;   COLONOSCOPY     ESOPHAGOGASTRODUODENOSCOPY  10/14/2007   Esophagus showed 2 cm pink tongue seen extending from the GE junction.  Biopsies obtained via cold  forceps.  Otherwise the  esophagus was without erosions, mass, ulceration, or stricture / . Large hiatal hernia pouch with linear erosions, and two 3-6 mmulcers seen in the pouch.  Biopsies obtained via cold forceps to  evaluate for H. pylori gastritis or malignancy   Ileocolonoscopy  10/14/2007   Multiple 1-3 mm terminal ileum ulcers biopsied  The ulcers seemed to only involve the last 5 cm of her terminal ileum, and 10 cm of her ileum was visualized.  Otherwise the  colon  was without polyps, masses, diverticula, or AVMs.  She had a normal retroflexed view of the rectum   OVARIAN CYST REMOVAL     TOTAL ABDOMINAL HYSTERECTOMY W/ BILATERAL SALPINGOOPHORECTOMY     UPPER GASTROINTESTINAL ENDOSCOPY  JULY 2011 DIARRHEA/FEDA    Bx-H.PYLORI GASTRITIS, NL DUODENUM     OB History   No obstetric history on file.     Family History  Problem Relation Age of Onset   Diabetes Mother    Cancer Father    Diabetes Father    Diabetes Sister    Diabetes Brother    Colon polyps Neg Hx    Colon cancer Neg Hx    Stroke Neg Hx     Social History   Tobacco Use   Smoking status: Never   Smokeless tobacco: Never  Vaping Use   Vaping Use: Never used  Substance Use Topics   Alcohol use: No   Drug use: No    Home Medications Prior to Admission medications   Medication Sig Start Date End Date Taking? Authorizing Provider  acetaminophen (TYLENOL) 325 MG tablet Take 650 mg by mouth as needed for moderate pain or headache.    [provider]  ergocalciferol (VITAMIN D2) 1.25 MG (50000 UT) capsule Take 1 capsule (50,000 Units total) by mouth once a week. 06/07/20   Derek Jack, MD  vitamin B-12 (CYANOCOBALAMIN) 500 MCG tablet Take 500 mcg by mouth daily.    [provider]    Allergies    Cefzil [cefprozil], Celebrex [celecoxib], and Sulfonamide derivatives  Review of Systems   Review of Systems  Respiratory:  Negative for cough and shortness of breath.   Cardiovascular:  Positive for chest pain and leg swelling.  Gastrointestinal:  Positive for nausea. Negative for abdominal pain.  All other systems reviewed and are negative.  Physical Exam Updated Vital Signs BP 126/62   Pulse 66   Temp 97.7 F (36.5 C) (Oral)   Resp 17   Ht 5' 5"  (1.651 m)   Wt 95.3 kg   SpO2 96%   BMI 34.95 kg/m   Physical Exam Vitals and nursing note reviewed.  Constitutional:      Appearance: She is well-developed.  HENT:     Head:  Normocephalic.  Eyes:     Extraocular Movements: Extraocular movements intact.  Cardiovascular:     Rate and Rhythm: Normal rate and regular rhythm.     Heart sounds: Normal heart sounds.  Pulmonary:     Effort: Pulmonary effort is normal.     Breath sounds: Normal breath sounds.  Chest:     Chest wall: No mass or tenderness.  Abdominal:     Palpations: Abdomen is soft.  Musculoskeletal:        General: Normal range of motion.     Left lower leg: Edema present.  Skin:    General: Skin is warm and dry.  Neurological:     Mental Status: She is alert and oriented to person,  place, and time.  Psychiatric:        Mood and Affect: Mood normal.        Behavior: Behavior normal.    ED Results / Procedures / Treatments   Labs (all labs ordered are listed, but only abnormal results are displayed) Labs Reviewed  COMPREHENSIVE METABOLIC PANEL - Abnormal; Notable for the following components:      Result Value   Glucose, Bld 107 (*)    Calcium 8.5 (*)    All other components within normal limits  CBC  TROPONIN I (HIGH SENSITIVITY)  TROPONIN I (HIGH SENSITIVITY)    EKG EKG Interpretation  Date/Time:  Wednesday June 27 2021 10:30:24 EST Ventricular Rate:  68 PR Interval:  180 QRS Duration: 78 QT Interval:  412 QTC Calculation: 438 R Axis:   27 Text Interpretation: Normal sinus rhythm Normal ECG Confirmed by Carmin Muskrat [phone]) on 06/27/2021 11:50:08 AM  Radiology DG Chest 2 View  Result Date: 06/27/2021 CLINICAL DATA:  Chest pain from hiatal hernia. Patient denies shortness of breath, fever or nausea and vomiting. EXAM: CHEST - 2 VIEW COMPARISON:  04/13/2021 FINDINGS: Heart size and mediastinal contours are within normal limits. There is a large hiatal hernia as noted previously. Subsegmental atelectasis is noted within both lung bases. No signs of pleural effusion, interstitial edema or airspace consolidation. The visualized osseous structures are unremarkable.  IMPRESSION: 1. Bibasilar atelectasis. 2. Large hiatal hernia. Electronically Signed   By: Kerby Moors M.D.   On: 06/27/2021 13:51    Procedures Procedures   Medications Ordered in ED Medications - No data to display  ED Course  I have reviewed the triage vital signs and the nursing notes.  Pertinent labs & imaging results that were available during my care of the patient were reviewed by me and considered in my medical decision making (see chart for details).    MDM Rules/Calculators/A&P                           Patient is to be discharged with recommendation to follow up with PCP in regards to today's hospital visit. Chest pain is not likely of cardiac or pulmonary etiology d/t presentation, perc negative, VSS, no tracheal deviation, no JVD or new murmur, RRR, breath sounds equal bilaterally, EKG without acute abnormalities, negative troponin, and negative CXR. Pt has been advised start a PPI and return to the ED is CP becomes exertional, associated with diaphoresis or nausea, radiates to left jaw/arm, worsens or becomes concerning in any way. Pt appears reliable for follow up and is agreeable to discharge.   Case has been discussed with  Dr. Vanita Panda who agrees with the above plan to discharge.   Final Clinical Impression(s) / ED Diagnoses Final diagnoses:  Nonspecific chest pain    Rx / DC Orders ED Discharge Orders     None        Etta Quill, NP 06/27/21 1436    Carmin Muskrat, MD 06/30/21 [phone]

## 2021-06-27 NOTE — ED Triage Notes (Signed)
Patient sent from PCP for chest pain that has became worse over the past several days.

## 2021-06-27 NOTE — ED Notes (Signed)
Dc instructions reviewed with pt. No questions or concerns at this time. Pt will follow up with pcp and cardiology. Declined wheelchair and ambulated with steady gait out of the ED.

## 2021-06-27 NOTE — Discharge Instructions (Signed)
Please touch base with Dr. Wolfgang Phoenix to facilitate referral to cardiology.

## 2021-06-27 NOTE — Progress Notes (Signed)
   Subjective:    Patient ID: [name], female    DOB: Feb 16, 1951, 70 y.o.   MRN: [id number]  HPI Pt having chest discomfort that has been going on for a while. Pt states she has not felt well and is having trouble breathing. Pt states Monday night she could not rest. Pt also having discomfort in arm and shoulder area.  She states the discomfort lasted all Monday evening she states she felt worse than she ever felt with discomfort, somewhat better on Tuesday and this morning  Patient with history of iron deficient anemia She relates approximately 4 to 5 months of intermittent chest pains that are now becoming almost daily on the left side of her chest.  She describes it as an uncomfortable feeling.  She also describes spells that occur at rest as well as with activity  She also describes shortness of breath with activity over the past few months but denies any PND denies orthopnea denies swelling in the legs  No recent infections illnesses or injuries  Patient does not come for regular visits regarding preventative health uncertain of cholesterol for this patient Review of Systems     Objective:   Physical Exam  General-in no acute distress Eyes-no discharge Lungs-respiratory rate normal, CTA CV-no murmurs,RRR Extremities skin warm dry no edema Neuro grossly normal Behavior normal, alert  EKG does not show any acute changes     Assessment & Plan:  Chest pain DOE Given the spell she had Monday evening It is necessary to make sure that there is not acute coronary syndrome going on.  Referral to the emergency department for x-ray lab work troponin etc., too risky to send home and do all of this as outpatient with these type of situation/labs  If the ER works her up in fine situation to be stable and releases her we will work with her closely to get her in with cardiology

## 2021-06-27 NOTE — Telephone Encounter (Signed)
Please initiate urgent referral to cardiology [place] or [place].  Reason chest discomfort Also I would recommend the patient be on a acid blocker Protonix 1 daily 40 mg, #30 with 2 refills this would help block any excessive acid that may also be a potential cause of chest discomfort please let the patient know that I fully understand that her other doctor told her that her hiatal hernia was not causing the chest pain but I believe that it is very reasonable to have her on acid blocker to see if this would help  And finally inform the patient that we 100% agree with that she does need to see cardiology for further evaluation and we are initiating that process ASAP

## 2021-06-27 NOTE — ED Notes (Signed)
Pt ambulated with steady gait to bathroom.

## 2021-06-28 MED ORDER — PANTOPRAZOLE SODIUM 40 MG PO TBEC: DELAYED_RELEASE_TABLET | ORAL | 2 refills | Status: DC

## 2021-06-28 NOTE — Telephone Encounter (Signed)
Urgent referral placed and medication sent to pharmacy. Message sent to referral coordinator. Pt verbalized understanding.

## 2021-07-02 ENCOUNTER — Other Ambulatory Visit (HOSPITAL_COMMUNITY): Payer: Self-pay | Admitting: Hematology

## 2021-07-02 DIAGNOSIS — E559 Vitamin D deficiency, unspecified: Secondary | ICD-10-CM

## 2021-07-04 ENCOUNTER — Encounter: Payer: Self-pay | Admitting: Nurse Practitioner

## 2021-07-04 ENCOUNTER — Ambulatory Visit (INDEPENDENT_AMBULATORY_CARE_PROVIDER_SITE_OTHER): Payer: Medicare HMO | Admitting: Nurse Practitioner

## 2021-07-04 ENCOUNTER — Other Ambulatory Visit: Payer: Self-pay

## 2021-07-04 ENCOUNTER — Inpatient Hospital Stay (HOSPITAL_COMMUNITY): Payer: Medicare HMO

## 2021-07-04 ENCOUNTER — Ambulatory Visit: Payer: Medicare HMO | Admitting: Family Medicine

## 2021-07-04 VITALS — BP 132/84 | HR 108 | Temp 97.5°F | Wt 207.8 lb

## 2021-07-04 DIAGNOSIS — R051 Acute cough: Secondary | ICD-10-CM | POA: Diagnosis not present

## 2021-07-04 NOTE — Progress Notes (Signed)
   Subjective:    Patient ID: [name], female    DOB: [birth year], 70 y.o.   MRN: [id number]  HPI  Pt reports non-productive cough, runny nose, and sneezing x3 days.  Patient denies fever, sore throat, ear pain, congestion, chest pain, N/V/D. Patient using Mucinex which she states helps.  Review of Systems  Constitutional:  Negative for activity change, appetite change, chills, diaphoresis, fatigue, fever and unexpected weight change.  HENT:  Positive for rhinorrhea and sneezing. Negative for congestion, ear discharge, ear pain, facial swelling, hearing loss, postnasal drip, sinus pressure, sinus pain, sore throat, tinnitus, trouble swallowing and voice change.   Respiratory:  Positive for cough. Negative for chest tightness and shortness of breath.   Neurological:  Negative for light-headedness and headaches.      Objective:   Physical Exam Constitutional:      General: She is not in acute distress.    Appearance: Normal appearance. She is normal weight. She is not ill-appearing or toxic-appearing.  HENT:     Head: Normocephalic and atraumatic.     Right Ear: Tympanic membrane, ear canal and external ear normal. There is no impacted cerumen.     Left Ear: Tympanic membrane, ear canal and external ear normal. There is no impacted cerumen.     Nose: Nose normal. No congestion or rhinorrhea.     Mouth/Throat:     Mouth: Mucous membranes are moist.     Pharynx: No oropharyngeal exudate or posterior oropharyngeal erythema.  Cardiovascular:     Rate and Rhythm: Normal rate and regular rhythm.     Pulses: Normal pulses.     Heart sounds: Normal heart sounds. No murmur heard. Pulmonary:     Effort: Pulmonary effort is normal. No respiratory distress.     Breath sounds: Normal breath sounds.  Musculoskeletal:     Cervical back: Normal range of motion. No rigidity or tenderness.  Lymphadenopathy:     Cervical: No cervical adenopathy.  Skin:    General: Skin is warm.     Capillary  Refill: Capillary refill takes less than 2 seconds.  Neurological:     General: No focal deficit present.     Mental Status: She is alert and oriented to person, place, and time.  Psychiatric:        Mood and Affect: Mood normal.        Behavior: Behavior normal.          Assessment & Plan:  1. Acute cough - Likely viral etiology - COVID-19, Flu A+B and RSV - May use Zyrtec OTC - May Continue to use Mucinex - If you develop fever, SOB, or difficulty breath report to Urgent Care or ED - RTC if symptoms not better within 7 days.  - Continue to wear mask and practice good hand hygiene while out in public.

## 2021-07-04 NOTE — Patient Instructions (Addendum)
May use Zyrtec (Cetrizine) over the counter. RTC if syptoms do not resolve within 7 days.

## 2021-07-05 LAB — COVID-19, FLU A+B AND RSV
Influenza A, NAA: NOT DETECTED
Influenza B, NAA: NOT DETECTED
RSV, NAA: NOT DETECTED
SARS-CoV-2, NAA: NOT DETECTED

## 2021-07-06 NOTE — Progress Notes (Signed)
Please call patient with the following message:  Hello [name]. Your Flu, COVID, and RSV tests are negative. You likely have a general viral illness.  If your symptoms do not get better within 5 days please come back to the clinic to be evaluated. I hope you are having a great time on your trip.

## 2021-07-11 ENCOUNTER — Inpatient Hospital Stay (HOSPITAL_COMMUNITY): Payer: Medicare HMO | Attending: Hematology

## 2021-07-11 ENCOUNTER — Encounter (HOSPITAL_COMMUNITY): Payer: Self-pay

## 2021-07-11 ENCOUNTER — Ambulatory Visit (HOSPITAL_COMMUNITY): Payer: Medicare HMO

## 2021-07-11 ENCOUNTER — Other Ambulatory Visit: Payer: Self-pay

## 2021-07-11 VITALS — BP 119/77 | HR 54 | Temp 97.5°F | Resp 18

## 2021-07-11 DIAGNOSIS — Z79899 Other long term (current) drug therapy: Secondary | ICD-10-CM | POA: Insufficient documentation

## 2021-07-11 DIAGNOSIS — E538 Deficiency of other specified B group vitamins: Secondary | ICD-10-CM | POA: Diagnosis not present

## 2021-07-11 DIAGNOSIS — K5090 Crohn's disease, unspecified, without complications: Secondary | ICD-10-CM | POA: Insufficient documentation

## 2021-07-11 DIAGNOSIS — K909 Intestinal malabsorption, unspecified: Secondary | ICD-10-CM | POA: Insufficient documentation

## 2021-07-11 DIAGNOSIS — D500 Iron deficiency anemia secondary to blood loss (chronic): Secondary | ICD-10-CM | POA: Diagnosis not present

## 2021-07-11 MED ORDER — SODIUM CHLORIDE 0.9 % IV SOLN
300.0000 mg | Freq: Once | INTRAVENOUS | Status: AC
  Administered 2021-07-11: 09:00:00 300 mg via INTRAVENOUS
  Filled 2021-07-11: qty 300

## 2021-07-11 MED ORDER — SODIUM CHLORIDE 0.9 % IV SOLN: Freq: Once | INTRAVENOUS | Status: AC

## 2021-07-11 NOTE — Patient Instructions (Signed)
[facility]  Discharge Instructions: Thank you for choosing [facility] to provide your oncology and hematology care.  If you have a lab appointment with the Cancer Center, please come in thru the Main Entrance and check in at the main information desk.  Wear comfortable clothing and clothing appropriate for easy access to any Portacath or PICC line.   We strive to give you quality time with your provider. You may need to reschedule your appointment if you arrive late (15 or more minutes).  Arriving late affects you and other patients whose appointments are after yours.  Also, if you miss three or more appointments without notifying the office, you may be dismissed from the clinic at the provider's discretion.      For prescription refill requests, have your pharmacy contact our office and allow 72 hours for refills to be completed.    Today you received the following: Venofer, return as scheduled.   To help prevent nausea and vomiting after your treatment, we encourage you to take your nausea medication as directed.  BELOW ARE SYMPTOMS THAT SHOULD BE REPORTED IMMEDIATELY: *FEVER GREATER THAN 100.4 F (38 C) OR HIGHER *CHILLS OR SWEATING *NAUSEA AND VOMITING THAT IS NOT CONTROLLED WITH YOUR NAUSEA MEDICATION *UNUSUAL SHORTNESS OF BREATH *UNUSUAL BRUISING OR BLEEDING *URINARY PROBLEMS (pain or burning when urinating, or frequent urination) *BOWEL PROBLEMS (unusual diarrhea, constipation, pain near the anus) TENDERNESS IN MOUTH AND THROAT WITH OR WITHOUT PRESENCE OF ULCERS (sore throat, sores in mouth, or a toothache) UNUSUAL RASH, SWELLING OR PAIN  UNUSUAL VAGINAL DISCHARGE OR ITCHING   Items with * indicate a potential emergency and should be followed up as soon as possible or go to the Emergency Department if any problems should occur.  Please show the CHEMOTHERAPY ALERT CARD or IMMUNOTHERAPY ALERT CARD at check-in to the Emergency Department and triage  nurse.  Should you have questions after your visit or need to cancel or reschedule your appointment, please contact  CANCER CENTER [phone]  and follow the prompts.  Office hours are 8:00 a.m. to 4:30 p.m. Monday - Friday. Please note that voicemails left after 4:00 p.m. may not be returned until the following business day.  We are closed weekends and major holidays. You have access to a nurse at all times for urgent questions. Please call the main number to the clinic [phone] and follow the prompts.  For any non-urgent questions, you may also contact your provider using MyChart. We now offer e-Visits for anyone 18 and older to request care online for non-urgent symptoms. For details visit [email or web address].   Also download the MyChart app! Go to the app store, search "MyChart", open the app, select [facility], and log in with your MyChart username and password.  Due to Covid, a mask is required upon entering the hospital/clinic. If you do not have a mask, one will be given to you upon arrival. For doctor visits, patients may have 1 support person aged 18 or older with them. For treatment visits, patients cannot have anyone with them due to current Covid guidelines and our immunocompromised population.  

## 2021-07-11 NOTE — Progress Notes (Signed)
Patient presents today for iron. Patient reports taking Tylenol and Claritin at home before coming in this morning. Patient tolerated iron infusion with no complaints voiced. Peripheral IV site clean and dry with good blood return noted before and after infusion. Band aid applied. VSS with discharge and left in satisfactory condition with no s/s of distress noted.

## 2021-07-18 ENCOUNTER — Other Ambulatory Visit: Payer: Self-pay

## 2021-07-18 ENCOUNTER — Inpatient Hospital Stay (HOSPITAL_COMMUNITY): Payer: Medicare HMO

## 2021-07-18 VITALS — BP 152/84 | HR 51 | Temp 97.0°F | Resp 18

## 2021-07-18 DIAGNOSIS — Z79899 Other long term (current) drug therapy: Secondary | ICD-10-CM | POA: Diagnosis not present

## 2021-07-18 DIAGNOSIS — D500 Iron deficiency anemia secondary to blood loss (chronic): Secondary | ICD-10-CM

## 2021-07-18 DIAGNOSIS — K5090 Crohn's disease, unspecified, without complications: Secondary | ICD-10-CM | POA: Diagnosis not present

## 2021-07-18 DIAGNOSIS — K909 Intestinal malabsorption, unspecified: Secondary | ICD-10-CM | POA: Diagnosis not present

## 2021-07-18 DIAGNOSIS — E538 Deficiency of other specified B group vitamins: Secondary | ICD-10-CM | POA: Diagnosis not present

## 2021-07-18 MED ORDER — SODIUM CHLORIDE 0.9 % IV SOLN
300.0000 mg | Freq: Once | INTRAVENOUS | Status: AC
  Administered 2021-07-18: 08:00:00 300 mg via INTRAVENOUS
  Filled 2021-07-18: qty 300

## 2021-07-18 MED ORDER — SODIUM CHLORIDE 0.9 % IV SOLN: Freq: Once | INTRAVENOUS | Status: AC

## 2021-07-18 NOTE — Progress Notes (Signed)
Patient presents today for Venofer infusion per providers order.  Vital signs WNL.  Patient has no new complaints at this time.  Patient takes Tylenol and Benadryl at home prior to infusion appointment.  Peripheral IV started and blood return noted pre and post infusion.  Venofer infusion given today per MD orders.  Stable during infusion without adverse affects.  Vital signs stable.  No complaints at this time.  Discharge from clinic ambulatory in stable condition.  Alert and oriented X 3.  Follow up with [facility] as scheduled.

## 2021-07-18 NOTE — Patient Instructions (Signed)
[facility]  Discharge Instructions: Thank you for choosing [facility] to provide your oncology and hematology care.  If you have a lab appointment with the Cancer Center, please come in thru the Main Entrance and check in at the main information desk.  Wear comfortable clothing and clothing appropriate for easy access to any Portacath or PICC line.   We strive to give you quality time with your provider. You may need to reschedule your appointment if you arrive late (15 or more minutes).  Arriving late affects you and other patients whose appointments are after yours.  Also, if you miss three or more appointments without notifying the office, you may be dismissed from the clinic at the provider's discretion.      For prescription refill requests, have your pharmacy contact our office and allow 72 hours for refills to be completed.    Today you received the following chemotherapy and/or immunotherapy agents Venofer      To help prevent nausea and vomiting after your treatment, we encourage you to take your nausea medication as directed.  BELOW ARE SYMPTOMS THAT SHOULD BE REPORTED IMMEDIATELY: *FEVER GREATER THAN 100.4 F (38 C) OR HIGHER *CHILLS OR SWEATING *NAUSEA AND VOMITING THAT IS NOT CONTROLLED WITH YOUR NAUSEA MEDICATION *UNUSUAL SHORTNESS OF BREATH *UNUSUAL BRUISING OR BLEEDING *URINARY PROBLEMS (pain or burning when urinating, or frequent urination) *BOWEL PROBLEMS (unusual diarrhea, constipation, pain near the anus) TENDERNESS IN MOUTH AND THROAT WITH OR WITHOUT PRESENCE OF ULCERS (sore throat, sores in mouth, or a toothache) UNUSUAL RASH, SWELLING OR PAIN  UNUSUAL VAGINAL DISCHARGE OR ITCHING   Items with * indicate a potential emergency and should be followed up as soon as possible or go to the Emergency Department if any problems should occur.  Please show the CHEMOTHERAPY ALERT CARD or IMMUNOTHERAPY ALERT CARD at check-in to the Emergency  Department and triage nurse.  Should you have questions after your visit or need to cancel or reschedule your appointment, please contact [facility] [phone]  and follow the prompts.  Office hours are 8:00 a.m. to 4:30 p.m. Monday - Friday. Please note that voicemails left after 4:00 p.m. may not be returned until the following business day.  We are closed weekends and major holidays. You have access to a nurse at all times for urgent questions. Please call the main number to the clinic [phone] and follow the prompts.  For any non-urgent questions, you may also contact your provider using MyChart. We now offer e-Visits for anyone 18 and older to request care online for non-urgent symptoms. For details visit [email or web address].   Also download the MyChart app! Go to the app store, search "MyChart", open the app, select [facility], and log in with your MyChart username and password.  Due to Covid, a mask is required upon entering the hospital/clinic. If you do not have a mask, one will be given to you upon arrival. For doctor visits, patients may have 1 support person aged 18 or older with them. For treatment visits, patients cannot have anyone with them due to current Covid guidelines and our immunocompromised population.  

## 2021-08-07 ENCOUNTER — Encounter: Payer: Self-pay | Admitting: Cardiology

## 2021-08-07 ENCOUNTER — Ambulatory Visit: Payer: Medicare HMO | Admitting: Cardiology

## 2021-08-07 VITALS — BP 122/70 | HR 81 | Ht 64.0 in | Wt 211.0 lb

## 2021-08-07 DIAGNOSIS — R0789 Other chest pain: Secondary | ICD-10-CM

## 2021-08-07 DIAGNOSIS — R1013 Epigastric pain: Secondary | ICD-10-CM

## 2021-08-07 NOTE — Progress Notes (Signed)
Clinical Summary [name] is a 71 y.o.female seen as a new consult, referred by Dr Wolfgang Phoenix for the following medical problems.  1.Chest pain - ER visit 06/27/21 with chest pain - Trop neg x 2. EKG NSR, no ischemic changes  - symptoms started in summertime.  - heaviness midchest. Could occur at rest or with activity. Some nausea associated. 7/10 in severity. Not positional. Constant for several days. Sometimes worst with eating.  - does a lot of walking at her job, mild DOE with activities  CAD risk factors: father with MIs 48s she believes       2. Iron deficient anemia   Past Medical History:  Diagnosis Date   Arthritis    B12 deficiency 07/09/2015   Crohn disease ([place]) JULY 2011 [place] TI ULCERS seen on CE, NO NSAID USE   Endometriosis    HISTORY  LEADING TO HYSTERECTOMY   Fibromyalgia    Fibromyalgia 12/09/2013   Gastric ulcer    GERD (gastroesophageal reflux disease)    Headache    Helicobacter pylori gastritis    HISTORY   History of hiatal hernia    Hives    Iron (Fe) deficiency anemia 2009   JAN 2012 NL HB & FERRITIN   Obesity    Osteoporosis 11/20/2014   Plantar fasciitis      Allergies  Allergen Reactions   Cefzil [Cefprozil] Rash   Celebrex [Celecoxib] Hives and Itching   Sulfonamide Derivatives Itching     Current Outpatient Medications  Medication Sig Dispense Refill   acetaminophen (TYLENOL) 325 MG tablet Take 650 mg by mouth as needed for moderate pain or headache.     ergocalciferol (VITAMIN D2) 1.25 MG (50000 UT) capsule Take 1 capsule by mouth every 7 (seven) days.     pantoprazole (PROTONIX) 40 MG tablet Take one tablet po daily 30 tablet 2   vitamin B-12 (CYANOCOBALAMIN) 500 MCG tablet Take 500 mcg by mouth daily.     Vitamin D, Ergocalciferol, (DRISDOL) 1.25 MG (50000 UNIT) CAPS capsule TAKE 1 CAPSULE BY MOUTH ONE TIME PER WEEK 12 capsule 4   No current facility-administered medications for this visit.    Facility-Administered Medications Ordered in Other Visits  Medication Dose Route Frequency Provider Last Rate Last Admin   0.9 %  sodium chloride infusion   Intravenous Continuous Derek Jack, MD   Paused at 11/17/18 0932     Past Surgical History:  Procedure Laterality Date   ABDOMINAL HYSTERECTOMY     CATARACT EXTRACTION W/PHACO Right 12/05/2015   Procedure: CATARACT EXTRACTION PHACO AND INTRAOCULAR LENS PLACEMENT ([place]);  Surgeon: Birder Robson, MD;  Location: [facility] ORS;  Service: Ophthalmology;  Laterality: Right;  [place] 00:59.9 AP% 21.9 CDE 13.10 fluid pack lot # [id number]   CATARACT EXTRACTION W/PHACO Left 06/24/2016   Procedure: CATARACT EXTRACTION PHACO AND INTRAOCULAR LENS PLACEMENT LEFT EYE; CDE:  8.33;  Surgeon: Tonny , MD;  Location: [facility];  Service: Ophthalmology;  Laterality: Left;   COLONOSCOPY     ESOPHAGOGASTRODUODENOSCOPY  10/14/2007   Esophagus showed 2 cm pink tongue seen extending from the GE junction.  Biopsies obtained via cold forceps.  Otherwise the  esophagus was without erosions, mass, ulceration, or stricture / . Large hiatal hernia pouch with linear erosions, and two 3-6 mmulcers seen in the pouch.  Biopsies obtained via cold forceps to  evaluate for H. pylori gastritis or malignancy   Ileocolonoscopy  10/14/2007   Multiple 1-3 mm terminal ileum ulcers  biopsied  The ulcers seemed to only involve the last 5 cm of her terminal ileum, and 10 cm of her ileum was visualized.  Otherwise the colon  was without polyps, masses, diverticula, or AVMs.  She had a normal retroflexed view of the rectum   OVARIAN CYST REMOVAL     TOTAL ABDOMINAL HYSTERECTOMY W/ BILATERAL SALPINGOOPHORECTOMY     UPPER GASTROINTESTINAL ENDOSCOPY  JULY 2011 DIARRHEA/FEDA    Bx-H.PYLORI GASTRITIS, NL DUODENUM     Allergies  Allergen Reactions   Cefzil [Cefprozil] Rash   Celebrex [Celecoxib] Hives and Itching   Sulfonamide Derivatives Itching      Family History  Problem  Relation Age of Onset   Diabetes Mother    Cancer Father    Diabetes Father    Diabetes Sister    Diabetes Brother    Colon polyps Neg Hx    Colon cancer Neg Hx    Stroke Neg Hx      Social History [name] reports that she has never smoked. She has never used smokeless tobacco. [name] reports no history of alcohol use.   Review of Systems CONSTITUTIONAL: No weight loss, fever, chills, weakness or fatigue.  HEENT: Eyes: No visual loss, blurred vision, double vision or yellow sclerae.No hearing loss, sneezing, congestion, runny nose or sore throat.  SKIN: No rash or itching.  CARDIOVASCULAR: per hpi RESPIRATORY: No shortness of breath, cough or sputum.  GASTROINTESTINAL: No anorexia, nausea, vomiting or diarrhea. No abdominal pain or blood.  GENITOURINARY: No burning on urination, no polyuria NEUROLOGICAL: No headache, dizziness, syncope, paralysis, ataxia, numbness or tingling in the extremities. No change in bowel or bladder control.  MUSCULOSKELETAL: No muscle, back pain, joint pain or stiffness.  LYMPHATICS: No enlarged nodes. No history of splenectomy.  PSYCHIATRIC: No history of depression or anxiety.  ENDOCRINOLOGIC: No reports of sweating, cold or heat intolerance. No polyuria or polydipsia.  Marland Kitchen   Physical Examination Today's Vitals   08/07/21 1315  BP: 122/70  Pulse: 81  SpO2: 96%  Weight: 211 lb (95.7 kg)  Height: 5' 4"  (1.626 m)   Body mass index is 36.22 kg/m.  Gen: resting comfortably, no acute distress HEENT: no scleral icterus, pupils equal round and reactive, no palptable cervical adenopathy,  CV: RRR, no m/r/g no jvd Resp: Clear to auscultation bilaterally GI: abdomen is soft, non-tender, non-distended, normal bowel sounds, no hepatosplenomegaly MSK: extremities are warm, no edema.  Skin: warm, no rash Neuro:  no focal deficits Psych: appropriate affect    Assessment and Plan  1.Atypical chest pain - chest heaviness that can last days at a  time some time brought on with eating. More suggestive of GI etiology, symptoms would not suggest cardiac etiology - will refer to GI for evaluation - if change in symptoms or negative GI evaluation could reconsider cardiac evaluation however with very atypical symptoms would not start with cardiac testing at this time.    F/u 6 months    Arnoldo Lenis, M.D

## 2021-08-07 NOTE — Patient Instructions (Addendum)
Medication Instructions:  Your physician recommends that you continue on your current medications as directed. Please refer to the Current Medication list given to you today.  Labwork: none  Testing/Procedures: none  Follow-Up: Your physician recommends that you schedule a follow-up appointment in: 6 months  Any Other Special Instructions Will Be Listed Below (If Applicable). You have been referred to Gastroenterology  If you need a refill on your cardiac medications before your next appointment, please call your pharmacy.

## 2021-08-08 ENCOUNTER — Encounter (INDEPENDENT_AMBULATORY_CARE_PROVIDER_SITE_OTHER): Payer: Self-pay | Admitting: *Deleted

## 2021-10-01 ENCOUNTER — Ambulatory Visit (INDEPENDENT_AMBULATORY_CARE_PROVIDER_SITE_OTHER): Payer: Medicare HMO | Admitting: Gastroenterology

## 2021-10-03 ENCOUNTER — Telehealth: Payer: Self-pay | Admitting: Family Medicine

## 2021-10-03 NOTE — Telephone Encounter (Signed)
?  Left message for patient to call back and schedule Medicare Annual Wellness Visit (AWV) in office.  ? ?If unable to come into the office for AWV,  please offer to do virtually or by telephone. ? ?No hx of AWV eligible for AWVI per [place] as of 11/26/2017 ? ?Please schedule at anytime with [facility]-Nurse Health Advisor.     ? ?40 Minutes appointment  ? ?Any questions, please call me at [phone]   ?

## 2021-10-09 ENCOUNTER — Inpatient Hospital Stay (HOSPITAL_COMMUNITY): Payer: Medicare HMO | Attending: Hematology

## 2021-10-09 DIAGNOSIS — E538 Deficiency of other specified B group vitamins: Secondary | ICD-10-CM | POA: Insufficient documentation

## 2021-10-09 DIAGNOSIS — E559 Vitamin D deficiency, unspecified: Secondary | ICD-10-CM | POA: Diagnosis not present

## 2021-10-09 DIAGNOSIS — D509 Iron deficiency anemia, unspecified: Secondary | ICD-10-CM | POA: Diagnosis not present

## 2021-10-09 DIAGNOSIS — D500 Iron deficiency anemia secondary to blood loss (chronic): Secondary | ICD-10-CM

## 2021-10-09 LAB — CBC WITH DIFFERENTIAL/PLATELET
Abs Immature Granulocytes: 0.03 10*3/uL (ref 0.00–0.07)
Basophils Absolute: 0.1 10*3/uL (ref 0.0–0.1)
Basophils Relative: 1 %
Eosinophils Absolute: 0.1 10*3/uL (ref 0.0–0.5)
Eosinophils Relative: 1 %
HCT: 37.7 % (ref 36.0–46.0)
Hemoglobin: 12.1 g/dL (ref 12.0–15.0)
Immature Granulocytes: 0 %
Lymphocytes Relative: 23 %
Lymphs Abs: 1.6 10*3/uL (ref 0.7–4.0)
MCH: 28.9 pg (ref 26.0–34.0)
MCHC: 32.1 g/dL (ref 30.0–36.0)
MCV: 90.2 fL (ref 80.0–100.0)
Monocytes Absolute: 0.5 10*3/uL (ref 0.1–1.0)
Monocytes Relative: 7 %
Neutro Abs: 4.8 10*3/uL (ref 1.7–7.7)
Neutrophils Relative %: 68 %
Platelets: 233 10*3/uL (ref 150–400)
RBC: 4.18 MIL/uL (ref 3.87–5.11)
RDW: 13.7 % (ref 11.5–15.5)
WBC: 7.1 10*3/uL (ref 4.0–10.5)
nRBC: 0 % (ref 0.0–0.2)

## 2021-10-09 LAB — IRON AND TIBC
Iron: 37 ug/dL (ref 28–170)
Saturation Ratios: 13 % (ref 10.4–31.8)
TIBC: 276 ug/dL (ref 250–450)
UIBC: 239 ug/dL

## 2021-10-09 LAB — FERRITIN: Ferritin: 104 ng/mL (ref 11–307)

## 2021-10-09 LAB — VITAMIN B12: Vitamin B-12: 171 pg/mL — ABNORMAL LOW (ref 180–914)

## 2021-10-09 LAB — VITAMIN D 25 HYDROXY (VIT D DEFICIENCY, FRACTURES): Vit D, 25-Hydroxy: 50.97 ng/mL (ref 30–100)

## 2021-10-11 LAB — METHYLMALONIC ACID, SERUM: Methylmalonic Acid, Quantitative: 308 nmol/L (ref 0–378)

## 2021-10-15 ENCOUNTER — Ambulatory Visit (INDEPENDENT_AMBULATORY_CARE_PROVIDER_SITE_OTHER): Payer: Medicare HMO | Admitting: Family Medicine

## 2021-10-15 ENCOUNTER — Encounter: Payer: Self-pay | Admitting: Family Medicine

## 2021-10-15 ENCOUNTER — Other Ambulatory Visit: Payer: Self-pay

## 2021-10-15 VITALS — BP 135/84 | HR 69 | Temp 97.8°F | Ht 64.0 in | Wt 210.0 lb

## 2021-10-15 DIAGNOSIS — R6889 Other general symptoms and signs: Secondary | ICD-10-CM

## 2021-10-15 DIAGNOSIS — B349 Viral infection, unspecified: Secondary | ICD-10-CM | POA: Diagnosis not present

## 2021-10-15 MED ORDER — HYDROCODONE BIT-HOMATROP MBR 5-1.5 MG/5ML PO SOLN: 5.0000 mL | Freq: Four times a day (QID) | ORAL | 0 refills | Status: AC | PRN

## 2021-10-15 NOTE — Progress Notes (Signed)
? ?  Subjective:  ? ? Patient ID: ELZINA DEVERA, female    DOB: August 22, 1950, 71 y.o.   MRN: [id number] ? ?HPI ?Cough, sore throat , congestion x 2 days  ?Has taken otc mucinex  ?Weakness- receives iron infusion q 3 months ?Patient with head congestion drainage coughing denies wheezing difficulty breathing cough kept her up at night she also relates some low-grade fever some body aches ?Review of Systems ? ?   ?Objective:  ? Physical Exam ?Gen-NAD not toxic ?TMS-normal bilateral ?T- normal no redness ?Chest-CTA respiratory rate normal no crackles ?CV RRR no murmur ?Skin-warm dry ?Neuro-grossly normal ? ? ? ? ?   ?Assessment & Plan:  ?Viral syndrome ?Flulike illness ?Rest up over the next few days ?Patient request evening cough medicine ?Hycodan as necessary caution drowsiness ?COVID test taken ?Await results ?Follow-up if progressive troubles ? ? ?

## 2021-10-16 LAB — NOVEL CORONAVIRUS, NAA: SARS-CoV-2, NAA: NOT DETECTED

## 2021-10-16 LAB — SPECIMEN STATUS REPORT

## 2021-10-16 NOTE — Progress Notes (Signed)
? ?Virtual Visit via Telephone Note ?[place] ? ?I connected with [name]  on 10/17/21 at  8:10 AM by telephone and verified that I am speaking with the correct person using two identifiers. ? ?Location: ?Patient: Home ?Provider: [facility] ?  ?I discussed the limitations, risks, security and privacy concerns of performing an evaluation and management service by telephone and the availability of in person appointments. I also discussed with the patient that there may be a patient responsible charge related to this service. The patient expressed understanding and agreed to proceed. ? ? ?REASON FOR VISIT: Follow-up for IDA ?PRIOR THERAPY: None ?CURRENT THERAPY: Intermittent IV iron (Venofer 300 mg x 2 on 07/11/2021 and 07/18/2021)  ?  ?HISTORY OF PRESENT ILLNESS: ?[name] [phone] year old female) follows at our clinic for iron deficiency anemia.  She was last evaluated via telemedicine visit with Tarri Abernethy PA-C on 06/13/2021. ?  ?At today's visit, she reports feeling fair.  She denies any recent hospitalizations, surgeries, or changes in her baseline health status. ?  ?She had improved energy after her IV iron in December 2022, but reports that she is starting to feel low again.  She currently reports fatigue with energy about 40%.  She denies any pica, restless legs, headaches, chest pain, dyspnea on exertion, lightheadedness, or syncope.   ? ?Her Crohn's disease remains relatively well controlled, and she reports that she has not had any recent flareups.  She denies any blood loss such as hematemesis, hematochezia, melena, or epistaxis. ? ?She continues to take her weekly vitamin D.  She has not been receiving monthly B12 injections from her PCP. ?  ?She reports 40% energy and 100% appetite.  She is maintaining stable weight at this time ? ?  ?OBSERVATIONS/OBJECTIVE: ?Review of Systems  ?Constitutional:  Positive for malaise/fatigue. Negative for chills, diaphoresis,  fever and weight loss.  ?Respiratory:  Negative for cough and shortness of breath.   ?Cardiovascular:  Negative for chest pain and palpitations.  ?Gastrointestinal:  Negative for abdominal pain, blood in stool, melena, nausea and vomiting.  ?Neurological:  Negative for dizziness and headaches.   ? ?PHYSICAL EXAM (per limitations of virtual telephone visit): The patient is alert and oriented x 3, exhibiting adequate mentation, good mood, and ability to speak in full sentences and execute sound judgement. ? ? ?ASSESSMENT & PLAN: ?1.  Iron deficiency anemia  ?- Secondary to chronic blood loss and malabsorption in the setting of inflammatory bowel disease ?- Most recent IV iron with 300 mg x 2 on 07/11/2021 and 12/21/20222 ?- No hematemesis, hematochezia, or melena  ?- Symptomatic with fatigue  ?- Most recent labs (10/09/2021): Hgb 12.1, ferritin 104, iron saturation 13% ?- PLAN: Recommend IV Venofer 300 mg x 3 doses (symptomatic with iron saturation < 20%)   ?- Repeat labs and RTC in 4 months.   ? ?2.  B12 deficiency ?- She has not yet restarted B12 injections from her PCP, despite being instructed to do so at her last appointment   ?- Most recent B12 (10/09/2021): Low vitamin B12 at 171, normal methylmalonic acid 308 ?- PLAN: Encouraged patient to follow-up with PCP for monthly B12 injections.  She is aware that she can receive her B12 injections at our clinic if she prefers.   ? ?3.  Vitamin D deficiency ?- Currently taking vitamin D 50,000 units weekly  ?- Most recent vitamin D (10/09/2021) is normal at 50.97 ?- PLAN: Continue weekly vitamin D ?  ?  4.  Crohn's disease ?- No active treatment ?- Last flare was 3+ years ago ? ? ?  ?I discussed the assessment and treatment plan with the patient. The patient was provided an opportunity to ask questions and all were answered. The patient agreed with the plan and demonstrated an understanding of the instructions. ?  ?The patient was advised to call back or seek an in-person  evaluation if the symptoms worsen or if the condition fails to improve as anticipated. ? ?I provided 15 minutes of non-face-to-face time during this encounter. ? ? ?Harriett Rush, PA-C ?10/17/21 8:24 AM ?

## 2021-10-17 ENCOUNTER — Telehealth: Payer: Self-pay | Admitting: Family Medicine

## 2021-10-17 ENCOUNTER — Inpatient Hospital Stay (HOSPITAL_BASED_OUTPATIENT_CLINIC_OR_DEPARTMENT_OTHER): Payer: Medicare HMO | Admitting: Physician Assistant

## 2021-10-17 DIAGNOSIS — D500 Iron deficiency anemia secondary to blood loss (chronic): Secondary | ICD-10-CM

## 2021-10-17 DIAGNOSIS — E538 Deficiency of other specified B group vitamins: Secondary | ICD-10-CM | POA: Diagnosis not present

## 2021-10-17 DIAGNOSIS — E559 Vitamin D deficiency, unspecified: Secondary | ICD-10-CM

## 2021-10-17 NOTE — Telephone Encounter (Signed)
B12 is low via hematology ?They recommend injections ?Patient is requesting to have these through our office ?We will help set up ?

## 2021-10-17 NOTE — Telephone Encounter (Signed)
Contacted patient. Pt states she is sick and "does not feel like getting that together right now". Advised pt to give [place] call back once she is feeling better.  ?

## 2021-10-17 NOTE — Telephone Encounter (Signed)
We received notice from hematology that her B12 was low ?They recommend B12 shots once monthly ?They stated that the patient wants to have that through our office ?Recommend nurse visit monthly for B12 injection 1 mL ?B12 deficiency is a diagnosis ?Please notify patient ?

## 2021-10-23 ENCOUNTER — Other Ambulatory Visit: Payer: Self-pay

## 2021-10-23 ENCOUNTER — Inpatient Hospital Stay (HOSPITAL_COMMUNITY): Payer: Medicare HMO

## 2021-10-23 VITALS — BP 137/54 | HR 62 | Temp 97.9°F | Resp 18

## 2021-10-23 DIAGNOSIS — D509 Iron deficiency anemia, unspecified: Secondary | ICD-10-CM | POA: Diagnosis not present

## 2021-10-23 DIAGNOSIS — D500 Iron deficiency anemia secondary to blood loss (chronic): Secondary | ICD-10-CM

## 2021-10-23 DIAGNOSIS — E538 Deficiency of other specified B group vitamins: Secondary | ICD-10-CM | POA: Diagnosis not present

## 2021-10-23 DIAGNOSIS — E559 Vitamin D deficiency, unspecified: Secondary | ICD-10-CM | POA: Diagnosis not present

## 2021-10-23 MED ORDER — SODIUM CHLORIDE 0.9 % IV SOLN
300.0000 mg | Freq: Once | INTRAVENOUS | Status: AC
  Administered 2021-10-23: 300 mg via INTRAVENOUS
  Filled 2021-10-23: qty 300

## 2021-10-23 MED ORDER — SODIUM CHLORIDE 0.9 % IV SOLN: Freq: Once | INTRAVENOUS | Status: AC

## 2021-10-23 NOTE — Progress Notes (Signed)
Patient presents today for Venofer infusion per providers order.  Vital signs WNL.  Patient took tylenol and Claritin at home prior to appointment.  No new complaints at this time.  ? ?Peripheral IV started and blood return noted pre and post infusion. ? ?Venofer given today per MD orders.  Stable during infusion without adverse affects.  Vital signs stable.  No complaints at this time.  Discharge from clinic ambulatory in stable condition.  Alert and oriented X 3.  Follow up with [facility] as scheduled.  ?

## 2021-10-23 NOTE — Patient Instructions (Signed)
[place]  Discharge Instructions: ?Thank you for choosing [facility] to provide your oncology and hematology care.  ?If you have a lab appointment with the [facility], please come in thru the Main Entrance and check in at the main information desk. ? ?Wear comfortable clothing and clothing appropriate for easy access to any Portacath or PICC line.  ? ?We strive to give you quality time with your provider. You may need to reschedule your appointment if you arrive late (15 or more minutes).  Arriving late affects you and other patients whose appointments are after yours.  Also, if you miss three or more appointments without notifying the office, you may be dismissed from the clinic at the provider?s discretion.    ?  ?For prescription refill requests, have your pharmacy contact our office and allow 72 hours for refills to be completed.   ? ?Today you received the following chemotherapy and/or immunotherapy agents Venofer    ?  ?To help prevent nausea and vomiting after your treatment, we encourage you to take your nausea medication as directed. ? ?BELOW ARE SYMPTOMS THAT SHOULD BE REPORTED IMMEDIATELY: ?*FEVER GREATER THAN 100.4 F (38 ?C) OR HIGHER ?*CHILLS OR SWEATING ?*NAUSEA AND VOMITING THAT IS NOT CONTROLLED WITH YOUR NAUSEA MEDICATION ?*UNUSUAL SHORTNESS OF BREATH ?*UNUSUAL BRUISING OR BLEEDING ?*URINARY PROBLEMS (pain or burning when urinating, or frequent urination) ?*BOWEL PROBLEMS (unusual diarrhea, constipation, pain near the anus) ?TENDERNESS IN MOUTH AND THROAT WITH OR WITHOUT PRESENCE OF ULCERS (sore throat, sores in mouth, or a toothache) ?UNUSUAL RASH, SWELLING OR PAIN  ?UNUSUAL VAGINAL DISCHARGE OR ITCHING  ? ?Items with * indicate a potential emergency and should be followed up as soon as possible or go to the Emergency Department if any problems should occur. ? ?Please show the CHEMOTHERAPY ALERT CARD or IMMUNOTHERAPY ALERT CARD at check-in to the Emergency  Department and triage nurse. ? ?Should you have questions after your visit or need to cancel or reschedule your appointment, please contact [facility] [phone]  and follow the prompts.  Office hours are 8:00 a.m. to 4:30 p.m. Monday - Friday. Please note that voicemails left after 4:00 p.m. may not be returned until the following business day.  We are closed weekends and major holidays. You have access to a nurse at all times for urgent questions. Please call the main number to the clinic [phone] and follow the prompts. ? ?For any non-urgent questions, you may also contact your provider using MyChart. We now offer e-Visits for anyone 91 and older to request care online for non-urgent symptoms. For details visit [email or web address] ?  ?Also download the MyChart app! Go to the app store, search "MyChart", open the app, select [facility], and log in with your MyChart username and password. ? ?Due to Covid, a mask is required upon entering the hospital/clinic. If you do not have a mask, one will be given to you upon arrival. For doctor visits, patients may have 1 support person aged 60 or older with them. For treatment visits, patients cannot have anyone with them due to current Covid guidelines and our immunocompromised population.  ?

## 2021-10-30 ENCOUNTER — Inpatient Hospital Stay (HOSPITAL_COMMUNITY): Payer: Medicare HMO | Attending: Hematology

## 2021-10-30 VITALS — BP 127/52 | HR 73 | Temp 97.9°F | Resp 18

## 2021-10-30 DIAGNOSIS — D500 Iron deficiency anemia secondary to blood loss (chronic): Secondary | ICD-10-CM

## 2021-10-30 DIAGNOSIS — D509 Iron deficiency anemia, unspecified: Secondary | ICD-10-CM | POA: Diagnosis not present

## 2021-10-30 MED ORDER — SODIUM CHLORIDE 0.9 % IV SOLN: Freq: Once | INTRAVENOUS | Status: AC

## 2021-10-30 MED ORDER — LORATADINE 10 MG PO TABS: 10.0000 mg | ORAL_TABLET | Freq: Once | ORAL | Status: DC

## 2021-10-30 MED ORDER — ACETAMINOPHEN 325 MG PO TABS: 650.0000 mg | ORAL_TABLET | Freq: Once | ORAL | Status: DC

## 2021-10-30 MED ORDER — SODIUM CHLORIDE 0.9 % IV SOLN
300.0000 mg | Freq: Once | INTRAVENOUS | Status: AC
  Administered 2021-10-30: 300 mg via INTRAVENOUS
  Filled 2021-10-30: qty 300

## 2021-10-30 NOTE — Patient Instructions (Signed)
[facility]  Discharge Instructions: ?Thank you for choosing [facility] to provide your oncology and hematology care.  ?If you have a lab appointment with the [facility], please come in thru the Main Entrance and check in at the main information desk. ? ?Wear comfortable clothing and clothing appropriate for easy access to any Portacath or PICC line.  ? ?We strive to give you quality time with your provider. You may need to reschedule your appointment if you arrive late (15 or more minutes).  Arriving late affects you and other patients whose appointments are after yours.  Also, if you miss three or more appointments without notifying the office, you may be dismissed from the clinic at the provider?s discretion.    ?  ?For prescription refill requests, have your pharmacy contact our office and allow 72 hours for refills to be completed.   ? ?Today you received Venofer 300 ?  ? ? ?BELOW ARE SYMPTOMS THAT SHOULD BE REPORTED IMMEDIATELY: ?*FEVER GREATER THAN 100.4 F (38 ?C) OR HIGHER ?*CHILLS OR SWEATING ?*NAUSEA AND VOMITING THAT IS NOT CONTROLLED WITH YOUR NAUSEA MEDICATION ?*UNUSUAL SHORTNESS OF BREATH ?*UNUSUAL BRUISING OR BLEEDING ?*URINARY PROBLEMS (pain or burning when urinating, or frequent urination) ?*BOWEL PROBLEMS (unusual diarrhea, constipation, pain near the anus) ?TENDERNESS IN MOUTH AND THROAT WITH OR WITHOUT PRESENCE OF ULCERS (sore throat, sores in mouth, or a toothache) ?UNUSUAL RASH, SWELLING OR PAIN  ?UNUSUAL VAGINAL DISCHARGE OR ITCHING  ? ?Items with * indicate a potential emergency and should be followed up as soon as possible or go to the Emergency Department if any problems should occur. ? ?Please show the CHEMOTHERAPY ALERT CARD or IMMUNOTHERAPY ALERT CARD at check-in to the Emergency Department and triage nurse. ? ?Should you have questions after your visit or need to cancel or reschedule your appointment, please contact [facility] [phone]   and follow the prompts.  Office hours are 8:00 a.m. to 4:30 p.m. Monday - Friday. Please note that voicemails left after 4:00 p.m. may not be returned until the following business day.  We are closed weekends and major holidays. You have access to a nurse at all times for urgent questions. Please call the main number to the clinic [phone] and follow the prompts. ? ?For any non-urgent questions, you may also contact your provider using MyChart. We now offer e-Visits for anyone 81 and older to request care online for non-urgent symptoms. For details visit [email or web address] ?  ?Also download the MyChart app! Go to the app store, search "MyChart", open the app, select [place], and log in with your MyChart username and password. ? ?Due to Covid, a mask is required upon entering the hospital/clinic. If you do not have a mask, one will be given to you upon arrival. For doctor visits, patients may have 1 support person aged 64 or older with them. For treatment visits, patients cannot have anyone with them due to current Covid guidelines and our immunocompromised population.  ?

## 2021-10-30 NOTE — Progress Notes (Signed)
Pt presents today for Venofer IV iron infusion per providre's order. Vital signs stable and pt voiced no new complaints at this time. Pt took own pre-meds in the clinic @1145  today. ? ?Peripheral IV started with good blood return pre and post infusion. ? ?Venofer 300 mg given today per MD orders. Tolerated infusion without adverse affects. Vital signs stable. No complaints at this time. Discharged from clinic ambulatory in stable condition. Alert and oriented x 3. F/U with [facility] as scheduled. Pt did not wait the 30 minute wait time per policy. ?

## 2021-10-31 ENCOUNTER — Telehealth: Payer: Self-pay | Admitting: Family Medicine

## 2021-10-31 NOTE — Telephone Encounter (Signed)
?  Left message for patient to call back and schedule Medicare Annual Wellness Visit (AWV) in office.  ? ?If unable to come into the office for AWV,  please offer to do virtually or by telephone. ? ?No hx of AWV eligible for AWVI per [facility] as of 11/26/2017 ? ?Please schedule at anytime with [facility].     ? ?45 minute appointment  ? ?Any questions, please call me at [phone]   ?

## 2021-10-31 NOTE — Telephone Encounter (Signed)
?  Left message for patient to call back and schedule Medicare Annual Wellness Visit (AWV) in office.  ? ?If unable to come into the office for AWV,  please offer to do virtually or by telephone. ? ?No hx of AWV eligible for AWVI per [facility] as of  07/29/2021 ? ?Please schedule at anytime with [facility].     ? ?45 minute appointment  ? ?Any questions, please call me at [phone]   ?

## 2021-11-06 ENCOUNTER — Inpatient Hospital Stay (HOSPITAL_COMMUNITY): Payer: Medicare HMO

## 2021-11-06 VITALS — BP 120/69 | HR 71 | Temp 98.1°F | Resp 18

## 2021-11-06 DIAGNOSIS — D500 Iron deficiency anemia secondary to blood loss (chronic): Secondary | ICD-10-CM

## 2021-11-06 DIAGNOSIS — D509 Iron deficiency anemia, unspecified: Secondary | ICD-10-CM | POA: Diagnosis not present

## 2021-11-06 MED ORDER — SODIUM CHLORIDE 0.9 % IV SOLN: Freq: Once | INTRAVENOUS | Status: AC

## 2021-11-06 MED ORDER — SODIUM CHLORIDE 0.9 % IV SOLN
300.0000 mg | Freq: Once | INTRAVENOUS | Status: AC
  Administered 2021-11-06: 300 mg via INTRAVENOUS
  Filled 2021-11-06: qty 5

## 2021-11-06 NOTE — Patient Instructions (Signed)
[facility]  Discharge Instructions: ?Thank you for choosing [facility] to provide your oncology and hematology care.  ?If you have a lab appointment with the [facility], please come in thru the Main Entrance and check in at the main information desk. ? ?Wear comfortable clothing and clothing appropriate for easy access to any Portacath or PICC line.  ? ?We strive to give you quality time with your provider. You may need to reschedule your appointment if you arrive late (15 or more minutes).  Arriving late affects you and other patients whose appointments are after yours.  Also, if you miss three or more appointments without notifying the office, you may be dismissed from the clinic at the provider?s discretion.    ?  ?For prescription refill requests, have your pharmacy contact our office and allow 72 hours for refills to be completed.   ? ?Today you received the following chemotherapy and/or immunotherapy agents Venofer    ?  ?To help prevent nausea and vomiting after your treatment, we encourage you to take your nausea medication as directed. ? ?BELOW ARE SYMPTOMS THAT SHOULD BE REPORTED IMMEDIATELY: ?*FEVER GREATER THAN 100.4 F (38 ?C) OR HIGHER ?*CHILLS OR SWEATING ?*NAUSEA AND VOMITING THAT IS NOT CONTROLLED WITH YOUR NAUSEA MEDICATION ?*UNUSUAL SHORTNESS OF BREATH ?*UNUSUAL BRUISING OR BLEEDING ?*URINARY PROBLEMS (pain or burning when urinating, or frequent urination) ?*BOWEL PROBLEMS (unusual diarrhea, constipation, pain near the anus) ?TENDERNESS IN MOUTH AND THROAT WITH OR WITHOUT PRESENCE OF ULCERS (sore throat, sores in mouth, or a toothache) ?UNUSUAL RASH, SWELLING OR PAIN  ?UNUSUAL VAGINAL DISCHARGE OR ITCHING  ? ?Items with * indicate a potential emergency and should be followed up as soon as possible or go to the Emergency Department if any problems should occur. ? ?Please show the CHEMOTHERAPY ALERT CARD or IMMUNOTHERAPY ALERT CARD at check-in to the Emergency  Department and triage nurse. ? ?Should you have questions after your visit or need to cancel or reschedule your appointment, please contact [facility] [phone]  and follow the prompts.  Office hours are 8:00 a.m. to 4:30 p.m. Monday - Friday. Please note that voicemails left after 4:00 p.m. may not be returned until the following business day.  We are closed weekends and major holidays. You have access to a nurse at all times for urgent questions. Please call the main number to the clinic [phone] and follow the prompts. ? ?For any non-urgent questions, you may also contact your provider using MyChart. We now offer e-Visits for anyone 77 and older to request care online for non-urgent symptoms. For details visit [email or web address]. ?  ?Also download the MyChart app! Go to the app store, search "MyChart", open the app, select [facility], and log in with your MyChart username and password. ? ?Due to Covid, a mask is required upon entering the hospital/clinic. If you do not have a mask, one will be given to you upon arrival. For doctor visits, patients may have 1 support person aged 59 or older with them. For treatment visits, patients cannot have anyone with them due to current Covid guidelines and our immunocompromised population.  ?

## 2021-11-06 NOTE — Progress Notes (Signed)
Patient presents today for Venofer per providers order.  Vital signs WNL.  Patient has no new complaints at this time.   ? ?Patient took premedications at home prior to her appointment. ? ?Peripheral IV started and blood return noted pre and post infusion.   ? ?Venofer given today per MD orders.  Stable during infusion without adverse affects.  Vital signs stable.  No complaints at this time.  Discharge from clinic ambulatory in stable condition.  Alert and oriented X 3.  Follow up with [facility] as scheduled.  ?

## 2021-11-30 ENCOUNTER — Encounter: Payer: Self-pay | Admitting: Family Medicine

## 2022-01-18 ENCOUNTER — Encounter (HOSPITAL_COMMUNITY): Payer: Self-pay | Admitting: Emergency Medicine

## 2022-01-18 ENCOUNTER — Emergency Department (HOSPITAL_COMMUNITY)
Admission: EM | Admit: 2022-01-18 | Discharge: 2022-01-18 | Disposition: A | Discharge status: Home / Self Care | Payer: Medicare HMO | Attending: Emergency Medicine | Admitting: Emergency Medicine

## 2022-01-18 DIAGNOSIS — H33312 Horseshoe tear of retina without detachment, left eye: Secondary | ICD-10-CM | POA: Diagnosis not present

## 2022-01-18 DIAGNOSIS — R519 Headache, unspecified: Secondary | ICD-10-CM | POA: Diagnosis not present

## 2022-01-18 DIAGNOSIS — H538 Other visual disturbances: Secondary | ICD-10-CM | POA: Insufficient documentation

## 2022-01-18 MED ORDER — ACETAMINOPHEN 325 MG PO TABS
650.0000 mg | ORAL_TABLET | Freq: Once | ORAL | Status: AC
  Administered 2022-01-18: 650 mg via ORAL
  Filled 2022-01-18: qty 2

## 2022-01-18 NOTE — ED Triage Notes (Signed)
Headache on and off since last week, seeing floaters in LT eye as well.  Reports floaters have gotten worse to LT eye and now it looks like a piece of lace that started yesterday.

## 2022-02-01 DIAGNOSIS — H33312 Horseshoe tear of retina without detachment, left eye: Secondary | ICD-10-CM | POA: Diagnosis not present

## 2022-02-01 DIAGNOSIS — H43813 Vitreous degeneration, bilateral: Secondary | ICD-10-CM | POA: Diagnosis not present

## 2022-02-01 DIAGNOSIS — H4312 Vitreous hemorrhage, left eye: Secondary | ICD-10-CM | POA: Diagnosis not present

## 2022-02-13 ENCOUNTER — Other Ambulatory Visit (HOSPITAL_COMMUNITY): Payer: Self-pay | Admitting: *Deleted

## 2022-02-13 ENCOUNTER — Inpatient Hospital Stay (HOSPITAL_COMMUNITY): Payer: Medicare HMO | Attending: Hematology

## 2022-02-13 DIAGNOSIS — E538 Deficiency of other specified B group vitamins: Secondary | ICD-10-CM | POA: Insufficient documentation

## 2022-02-13 DIAGNOSIS — D509 Iron deficiency anemia, unspecified: Secondary | ICD-10-CM | POA: Insufficient documentation

## 2022-02-13 DIAGNOSIS — D500 Iron deficiency anemia secondary to blood loss (chronic): Secondary | ICD-10-CM

## 2022-02-13 DIAGNOSIS — Z79899 Other long term (current) drug therapy: Secondary | ICD-10-CM | POA: Diagnosis not present

## 2022-02-13 DIAGNOSIS — E559 Vitamin D deficiency, unspecified: Secondary | ICD-10-CM | POA: Insufficient documentation

## 2022-02-13 LAB — CBC WITH DIFFERENTIAL/PLATELET
Abs Immature Granulocytes: 0.02 10*3/uL (ref 0.00–0.07)
Basophils Absolute: 0.1 10*3/uL (ref 0.0–0.1)
Basophils Relative: 1 %
Eosinophils Absolute: 0.1 10*3/uL (ref 0.0–0.5)
Eosinophils Relative: 1 %
HCT: 39.6 % (ref 36.0–46.0)
Hemoglobin: 12.9 g/dL (ref 12.0–15.0)
Immature Granulocytes: 0 %
Lymphocytes Relative: 25 %
Lymphs Abs: 1.7 10*3/uL (ref 0.7–4.0)
MCH: 29.3 pg (ref 26.0–34.0)
MCHC: 32.6 g/dL (ref 30.0–36.0)
MCV: 89.8 fL (ref 80.0–100.0)
Monocytes Absolute: 0.3 10*3/uL (ref 0.1–1.0)
Monocytes Relative: 5 %
Neutro Abs: 4.6 10*3/uL (ref 1.7–7.7)
Neutrophils Relative %: 68 %
Platelets: 211 10*3/uL (ref 150–400)
RBC: 4.41 MIL/uL (ref 3.87–5.11)
RDW: 13.3 % (ref 11.5–15.5)
WBC: 6.8 10*3/uL (ref 4.0–10.5)
nRBC: 0 % (ref 0.0–0.2)

## 2022-02-13 LAB — VITAMIN B12: Vitamin B-12: 185 pg/mL (ref 180–914)

## 2022-02-13 LAB — IRON AND TIBC
Iron: 42 ug/dL (ref 28–170)
Saturation Ratios: 15 % (ref 10.4–31.8)
TIBC: 278 ug/dL (ref 250–450)
UIBC: 236 ug/dL

## 2022-02-13 LAB — FERRITIN: Ferritin: 161 ng/mL (ref 11–307)

## 2022-02-19 NOTE — Progress Notes (Unsigned)
[name], [place] [address]   CLINIC:  Medical Oncology/Hematology  PCP:  Kathyrn Drown, MD [address] [place] [address] [phone]   REASON FOR VISIT:  Follow-up for iron deficiency anemia  CURRENT THERAPY: Intermittent IV iron (Venofer 300 mg x 3 from 10/23/2021 through 11/06/2021)  INTERVAL HISTORY:  [name] 71 y.o. female returns for routine follow-up of her iron deficiency anemia.  She was last evaluated via telemedicine visit by Tarri Abernethy PA-C on 10/17/2021.  At today's visit, she reports feeling somewhat poorly due to fatigue.  No recent hospitalizations, surgeries, or changes in baseline health status.  She had improved energy after her IV iron in March/April 2023, but reports that she is starting to feel low again.  She currently reports worsening fatigue with energy about 25%.  She denies any pica, restless legs, headaches, chest pain, dyspnea on exertion, lightheadedness, or syncope.    Her Crohn's disease remains relatively well controlled, and she reports that she has not had any recent flareups.   She denies any blood loss such as hematemesis, hematochezia, melena, or epistaxis.  She continues to take her weekly vitamin D.  She has not yet started monthly B12 injections.  She has 25% energy and 100% appetite. She endorses that she is maintaining a stable weight.   REVIEW OF SYSTEMS:    Review of Systems  Constitutional:  Positive for fatigue. Negative for appetite change, chills, diaphoresis, fever and unexpected weight change.  HENT:   Negative for lump/mass and nosebleeds.   Eyes:  Negative for eye problems.  Respiratory:  Negative for cough, hemoptysis and shortness of breath.   Cardiovascular:  Negative for chest pain, leg swelling and palpitations.  Gastrointestinal:  Positive for constipation and diarrhea. Negative for abdominal pain, blood in stool, nausea and vomiting.  Genitourinary:   Negative for hematuria.   Musculoskeletal:  Positive for arthralgias.  Skin: Negative.   Neurological:  Negative for dizziness, headaches and light-headedness.  Hematological:  Does not bruise/bleed easily.      PAST MEDICAL/SURGICAL HISTORY:  Past Medical History:  Diagnosis Date   Arthritis    B12 deficiency 07/09/2015   Crohn disease ([place]) JULY 2011 [place] TI ULCERS seen on CE, NO NSAID USE   Endometriosis    HISTORY  LEADING TO HYSTERECTOMY   Fibromyalgia    Fibromyalgia 12/09/2013   Gastric ulcer    GERD (gastroesophageal reflux disease)    Headache    Helicobacter pylori gastritis    HISTORY   History of hiatal hernia    Hives    Iron (Fe) deficiency anemia 2009   JAN 2012 NL HB & FERRITIN   Obesity    Osteoporosis 11/20/2014   Plantar fasciitis    Past Surgical History:  Procedure Laterality Date   ABDOMINAL HYSTERECTOMY     CATARACT EXTRACTION W/PHACO Right 12/05/2015   Procedure: CATARACT EXTRACTION PHACO AND INTRAOCULAR LENS PLACEMENT ([place]);  Surgeon: Birder Robson, MD;  Location: [facility] ORS;  Service: Ophthalmology;  Laterality: Right;  [place] 00:59.9 AP% 21.9 CDE 13.10 fluid pack lot # [id number]   CATARACT EXTRACTION W/PHACO Left 06/24/2016   Procedure: CATARACT EXTRACTION PHACO AND INTRAOCULAR LENS PLACEMENT LEFT EYE; CDE:  8.33;  Surgeon: Tonny Branch, MD;  Location: [facility];  Service: Ophthalmology;  Laterality: Left;   COLONOSCOPY     ESOPHAGOGASTRODUODENOSCOPY  10/14/2007   Esophagus showed 2 cm pink tongue seen extending from the GE junction.  Biopsies obtained via cold forceps.  Otherwise the  esophagus was without erosions, mass, ulceration, or stricture / . Large hiatal hernia pouch with linear erosions, and two 3-6 mmulcers seen in the pouch.  Biopsies obtained via cold forceps to  evaluate for H. pylori gastritis or malignancy   Ileocolonoscopy  10/14/2007   Multiple 1-3 mm terminal ileum ulcers biopsied  The ulcers seemed to only involve the last  5 cm of her terminal ileum, and 10 cm of her ileum was visualized.  Otherwise the colon  was without polyps, masses, diverticula, or AVMs.  She had a normal retroflexed view of the rectum   OVARIAN CYST REMOVAL     TOTAL ABDOMINAL HYSTERECTOMY W/ BILATERAL SALPINGOOPHORECTOMY     UPPER GASTROINTESTINAL ENDOSCOPY  JULY 2011 DIARRHEA/FEDA    Bx-H.PYLORI GASTRITIS, NL DUODENUM     SOCIAL HISTORY:  Social History   Socioeconomic History   Marital status: Married    Spouse name: Alvester Chou    Number of children: 3   Years of education: 12   Highest education level: Not on file  Occupational History   Occupation: Retired  Tobacco Use   Smoking status: Never   Smokeless tobacco: Never  Vaping Use   Vaping Use: Never used  Substance and Sexual Activity   Alcohol use: No   Drug use: No   Sexual activity: Yes    Birth control/protection: Surgical  Other Topics Concern   Not on file  Social History Narrative   Lives w/ husband   Caffeine use: Drinks 1-2 cups coffee per day   Social Determinants of Health   Financial Resource Strain: Not on file  Food Insecurity: Not on file  Transportation Needs: Not on file  Physical Activity: Not on file  Stress: Not on file  Social Connections: Not on file  Intimate Partner Violence: Not At Risk (11/23/2020)   Humiliation, Afraid, Rape, and Kick questionnaire    Fear of Current or Ex-Partner: No    Emotionally Abused: No    Physically Abused: No    Sexually Abused: No    FAMILY HISTORY:  Family History  Problem Relation Age of Onset   Diabetes Mother    Cancer Father    Diabetes Father    Diabetes Sister    Diabetes Brother    Colon polyps Neg Hx    Colon cancer Neg Hx    Stroke Neg Hx     CURRENT MEDICATIONS:  Outpatient Encounter Medications as of 02/20/2022  Medication Sig   acetaminophen (TYLENOL) 325 MG tablet Take 650 mg by mouth as needed for moderate pain or headache.   ergocalciferol (VITAMIN D2) 1.25 MG (50000 UT)  capsule Take 1 capsule by mouth every 7 (seven) days.   pantoprazole (PROTONIX) 40 MG tablet Take one tablet po daily   vitamin B-12 (CYANOCOBALAMIN) 500 MCG tablet Take 500 mcg by mouth daily.   Vitamin D, Ergocalciferol, (DRISDOL) 1.25 MG (50000 UNIT) CAPS capsule TAKE 1 CAPSULE BY MOUTH ONE TIME PER WEEK   Facility-Administered Encounter Medications as of 02/20/2022  Medication   0.9 %  sodium chloride infusion    ALLERGIES:  Allergies  Allergen Reactions   Cefzil [Cefprozil] Rash   Celebrex [Celecoxib] Hives and Itching   Sulfonamide Derivatives Itching     PHYSICAL EXAM:  ECOG PERFORMANCE STATUS: 1 - Symptomatic but completely ambulatory  There were no vitals filed for this visit. There were no vitals filed for this visit. Physical Exam Constitutional:  Appearance: Normal appearance. She is obese.  HENT:     Head: Normocephalic and atraumatic.     Mouth/Throat:     Mouth: Mucous membranes are moist.  Eyes:     Extraocular Movements: Extraocular movements intact.     Pupils: Pupils are equal, round, and reactive to light.  Cardiovascular:     Rate and Rhythm: Normal rate and regular rhythm.     Pulses: Normal pulses.     Heart sounds: Normal heart sounds.  Pulmonary:     Effort: Pulmonary effort is normal.     Breath sounds: Normal breath sounds.  Abdominal:     General: Bowel sounds are normal.     Palpations: Abdomen is soft.     Tenderness: There is no abdominal tenderness.  Musculoskeletal:        General: No swelling.     Right lower leg: Edema (trace) present.     Left lower leg: Edema (trace) present.  Lymphadenopathy:     Cervical: No cervical adenopathy.  Skin:    General: Skin is warm and dry.  Neurological:     General: No focal deficit present.     Mental Status: She is alert and oriented to person, place, and time.  Psychiatric:        Mood and Affect: Mood normal.        Behavior: Behavior normal.     LABORATORY DATA:  I have  reviewed the labs as listed.  CBC    Component Value Date/Time   WBC 6.8 02/13/2022 1357   RBC 4.41 02/13/2022 1357   HGB 12.9 02/13/2022 1357   HCT 39.6 02/13/2022 1357   PLT 211 02/13/2022 1357   MCV 89.8 02/13/2022 1357   MCH 29.3 02/13/2022 1357   MCHC 32.6 02/13/2022 1357   RDW 13.3 02/13/2022 1357   LYMPHSABS 1.7 02/13/2022 1357   MONOABS 0.3 02/13/2022 1357   EOSABS 0.1 02/13/2022 1357   BASOSABS 0.1 02/13/2022 1357      Latest Ref Rng & Units 06/27/2021   10:51 AM 06/11/2021    3:00 PM 08/02/2020    3:15 PM  CMP  Glucose 70 - 99 mg/dL 107  101  106   BUN 8 - 23 mg/dL 16  22  19    Creatinine 0.44 - 1.00 mg/dL 0.64  0.62  0.65   Sodium 135 - 145 mmol/L 137  137  138   Potassium 3.5 - 5.1 mmol/L 3.9  3.7  3.7   [place] 98 - 111 mmol/L 106  107  106   CO2 22 - 32 mmol/L 23  23  23    Calcium 8.9 - 10.3 mg/dL 8.5  8.8  9.2   Total Protein 6.5 - 8.1 g/dL 7.0  7.4  7.5   Total Bilirubin 0.3 - 1.2 mg/dL 0.4  0.6  0.6   Alkaline Phos 38 - 126 U/L 99  96  93   AST 15 - 41 U/L 16  17  23    ALT 0 - 44 U/L 19  20  27      DIAGNOSTIC IMAGING:  I have independently reviewed the relevant imaging and discussed with the patient.  ASSESSMENT & PLAN: 1.  Iron deficiency anemia  - Secondary to chronic blood loss and malabsorption in the setting of inflammatory bowel disease - Most recent IV iron with 300 mg x 3 from 10/23/2021 through 11/06/2021 - No hematemesis, hematochezia, or melena    - Symptomatic with significant fatigue (energy 25%), which  was improved after her most recent IV iron but has started to worsen again  - Most recent labs (02/13/2022): Hgb 12.9/MCV 89.8, ferritin 161, iron saturation 15%  - PLAN: Recommend IV Venofer 300 mg x 2 doses (symptomatic iron deficiency with iron saturation < 20%, ferritin may be acute phase reactant and falsely elevated in the setting of Crohn's disease)    - Repeat labs and RTC in 4 months.       2.  B12 deficiency - She has not yet  restarted B12 injections from her PCP, despite being instructed to do so at her last appointment     - Most recent B12 (02/13/2022): Low vitamin B12 185  - She is symptomatic with significant fatigue - PLAN: Since patient has not followed through with getting B12 injections from her PCP, we will schedule her for B12 injections here at the [facility].  Vitamin B12 1000 mcg weekly x4, followed by monthly vitamin B12 injections. - We will check a B12/methylmalonic acid at follow-up visit in 4 months.  3.  Vitamin D deficiency - Currently taking vitamin D 50,000 units weekly    - Most recent vitamin D (10/09/2021) is normal at 50.97 - PLAN: Continue weekly vitamin D   4.  Crohn's disease - No active treatment - Last flare was 3+ years ago    All questions were answered. The patient knows to call the clinic with any problems, questions or concerns.  Medical decision making: Low  Time spent on visit: I spent 15 minutes counseling the patient face to face. The total time spent in the appointment was 25 minutes and more than 50% was on counseling.   Harriett Rush, PA-C  02/20/2022 11:23 AM

## 2022-02-20 ENCOUNTER — Inpatient Hospital Stay (HOSPITAL_BASED_OUTPATIENT_CLINIC_OR_DEPARTMENT_OTHER): Payer: Medicare HMO | Admitting: Physician Assistant

## 2022-02-20 ENCOUNTER — Inpatient Hospital Stay (HOSPITAL_COMMUNITY): Payer: Medicare HMO

## 2022-02-20 VITALS — BP 152/73 | HR 67 | Temp 98.3°F | Resp 19 | Ht 64.0 in | Wt 196.6 lb

## 2022-02-20 DIAGNOSIS — E538 Deficiency of other specified B group vitamins: Secondary | ICD-10-CM

## 2022-02-20 DIAGNOSIS — D500 Iron deficiency anemia secondary to blood loss (chronic): Secondary | ICD-10-CM | POA: Diagnosis not present

## 2022-02-20 DIAGNOSIS — Z79899 Other long term (current) drug therapy: Secondary | ICD-10-CM | POA: Diagnosis not present

## 2022-02-20 DIAGNOSIS — D509 Iron deficiency anemia, unspecified: Secondary | ICD-10-CM | POA: Diagnosis not present

## 2022-02-20 DIAGNOSIS — E559 Vitamin D deficiency, unspecified: Secondary | ICD-10-CM | POA: Diagnosis not present

## 2022-02-20 MED ORDER — CYANOCOBALAMIN 1000 MCG/ML IJ SOLN
1000.0000 ug | Freq: Once | INTRAMUSCULAR | Status: AC
  Administered 2022-02-20: 1000 ug via INTRAMUSCULAR
  Filled 2022-02-20: qty 1

## 2022-02-20 NOTE — Progress Notes (Signed)
[name] presents today for injection per the provider's orders.  Vitamin B12 administration without incident; injection site WNL; see MAR for injection details.  Patient tolerated procedure well and without incident. Patient remained stable throughout visit.  No questions or complaints noted at this time. Patient discharged ambulatory in stable condition.

## 2022-02-20 NOTE — Patient Instructions (Signed)
[place] at [facility] Discharge Instructions  You were seen today by Tarri Abernethy PA-C for your iron deficiency and vitamin B12 deficiency.  Your blood levels look great!  You do not have any anemia at this time.  Your iron levels are slightly low, which is likely related to your Crohn's disease and is causing some of your fatigue.  We will schedule you for IV iron x2 doses.  Your vitamin B-12 levels are also low.  We will schedule you for vitamin B12 injections here at the [facility]... Vitamin B12 injection once a week x4 weeks followed by Vitamin B12 injection once a month  LABS: Return in 4 months for repeat labs  FOLLOW-UP APPOINTMENT: PHONE visit after labs   - - - - - - - - - - - - - - - - - -    Thank you for choosing [place] at [facility] to provide your oncology and hematology care.  To afford each patient quality time with our provider, please arrive at least 15 minutes before your scheduled appointment time.   If you have a lab appointment with the [place] please come in thru the Main Entrance and check in at the main information desk.  You need to re-schedule your appointment should you arrive 10 or more minutes late.  We strive to give you quality time with our providers, and arriving late affects you and other patients whose appointments are after yours.  Also, if you no show three or more times for appointments you may be dismissed from the clinic at the providers discretion.     Again, thank you for choosing [facility].  Our hope is that these requests will decrease the amount of time that you wait before being seen by our physicians.       _____________________________________________________________  Should you have questions after your visit to [facility], please contact our office at [phone] and follow the prompts.  Our office hours are 8:00 a.m. and 4:30 p.m. Monday  - Friday.  Please note that voicemails left after 4:00 p.m. may not be returned until the following business day.  We are closed weekends and major holidays.  You do have access to a nurse 24-7, just call the main number to the clinic [phone] and do not press any options, hold on the line and a nurse will answer the phone.    For prescription refill requests, have your pharmacy contact our office and allow 72 hours.    Due to Covid, you will need to wear a mask upon entering the hospital. If you do not have a mask, a mask will be given to you at the Main Entrance upon arrival. For doctor visits, patients may have 1 support person age 8 or older with them. For treatment visits, patients can not have anyone with them due to social distancing guidelines and our immunocompromised population.

## 2022-02-20 NOTE — Patient Instructions (Signed)
[place]  Discharge Instructions: Thank you for choosing [place] to provide your oncology and hematology care.  If you have a lab appointment with the [facility], please come in thru the Main Entrance and check in at the main information desk.  Wear comfortable clothing and clothing appropriate for easy access to any Portacath or PICC line.   We strive to give you quality time with your provider. You may need to reschedule your appointment if you arrive late (15 or more minutes).  Arriving late affects you and other patients whose appointments are after yours.  Also, if you miss three or more appointments without notifying the office, you may be dismissed from the clinic at the provider's discretion.      For prescription refill requests, have your pharmacy contact our office and allow 72 hours for refills to be completed.    Today you received your Vitamin B12 injection today.      To help prevent nausea and vomiting after your treatment, we encourage you to take your nausea medication as directed.  BELOW ARE SYMPTOMS THAT SHOULD BE REPORTED IMMEDIATELY: *FEVER GREATER THAN 100.4 F (38 C) OR HIGHER *CHILLS OR SWEATING *NAUSEA AND VOMITING THAT IS NOT CONTROLLED WITH YOUR NAUSEA MEDICATION *UNUSUAL SHORTNESS OF BREATH *UNUSUAL BRUISING OR BLEEDING *URINARY PROBLEMS (pain or burning when urinating, or frequent urination) *BOWEL PROBLEMS (unusual diarrhea, constipation, pain near the anus) TENDERNESS IN MOUTH AND THROAT WITH OR WITHOUT PRESENCE OF ULCERS (sore throat, sores in mouth, or a toothache) UNUSUAL RASH, SWELLING OR PAIN  UNUSUAL VAGINAL DISCHARGE OR ITCHING   Items with * indicate a potential emergency and should be followed up as soon as possible or go to the Emergency Department if any problems should occur.  Please show the CHEMOTHERAPY ALERT CARD or IMMUNOTHERAPY ALERT CARD at check-in to the Emergency Department and triage  nurse.  Should you have questions after your visit or need to cancel or reschedule your appointment, please contact [facility] [phone]  and follow the prompts.  Office hours are 8:00 a.m. to 4:30 p.m. Monday - Friday. Please note that voicemails left after 4:00 p.m. may not be returned until the following business day.  We are closed weekends and major holidays. You have access to a nurse at all times for urgent questions. Please call the main number to the clinic [phone] and follow the prompts.  For any non-urgent questions, you may also contact your provider using MyChart. We now offer e-Visits for anyone 67 and older to request care online for non-urgent symptoms. For details visit [email or web address].   Also download the MyChart app! Go to the app store, search "MyChart", open the app, select [place], and log in with your MyChart username and password.  Masks are optional in the cancer centers. If you would like for your care team to wear a mask while they are taking care of you, please let them know. For doctor visits, patients may have with them one support person who is at least 70 years old. At this time, visitors are not allowed in the infusion area.

## 2022-02-22 ENCOUNTER — Other Ambulatory Visit (HOSPITAL_COMMUNITY): Payer: Medicare HMO

## 2022-02-22 ENCOUNTER — Ambulatory Visit (HOSPITAL_COMMUNITY): Payer: Medicare HMO | Admitting: Physician Assistant

## 2022-02-27 ENCOUNTER — Inpatient Hospital Stay: Payer: Medicare HMO | Attending: Hematology

## 2022-02-27 DIAGNOSIS — E538 Deficiency of other specified B group vitamins: Secondary | ICD-10-CM | POA: Insufficient documentation

## 2022-02-27 DIAGNOSIS — D509 Iron deficiency anemia, unspecified: Secondary | ICD-10-CM | POA: Insufficient documentation

## 2022-03-06 ENCOUNTER — Inpatient Hospital Stay: Payer: Medicare HMO

## 2022-03-06 DIAGNOSIS — H33312 Horseshoe tear of retina without detachment, left eye: Secondary | ICD-10-CM | POA: Diagnosis not present

## 2022-03-06 DIAGNOSIS — H43813 Vitreous degeneration, bilateral: Secondary | ICD-10-CM | POA: Diagnosis not present

## 2022-03-06 DIAGNOSIS — H4312 Vitreous hemorrhage, left eye: Secondary | ICD-10-CM | POA: Diagnosis not present

## 2022-03-08 ENCOUNTER — Inpatient Hospital Stay: Payer: Medicare HMO

## 2022-03-08 VITALS — BP 156/60 | HR 66 | Temp 98.1°F | Resp 18

## 2022-03-08 DIAGNOSIS — D500 Iron deficiency anemia secondary to blood loss (chronic): Secondary | ICD-10-CM

## 2022-03-08 DIAGNOSIS — D509 Iron deficiency anemia, unspecified: Secondary | ICD-10-CM | POA: Diagnosis not present

## 2022-03-08 DIAGNOSIS — E538 Deficiency of other specified B group vitamins: Secondary | ICD-10-CM | POA: Diagnosis not present

## 2022-03-08 MED ORDER — CYANOCOBALAMIN 1000 MCG/ML IJ SOLN
1000.0000 ug | Freq: Once | INTRAMUSCULAR | Status: AC
  Administered 2022-03-08: 1000 ug via INTRAMUSCULAR

## 2022-03-08 MED ORDER — CYANOCOBALAMIN 1000 MCG/ML IJ SOLN
INTRAMUSCULAR | Status: AC
  Filled 2022-03-08: qty 1

## 2022-03-08 MED ORDER — LORATADINE 10 MG PO TABS: 10.0000 mg | ORAL_TABLET | Freq: Once | ORAL | Status: DC

## 2022-03-08 MED ORDER — ACETAMINOPHEN 325 MG PO TABS: 650.0000 mg | ORAL_TABLET | Freq: Once | ORAL | Status: DC

## 2022-03-08 NOTE — Progress Notes (Signed)
B12 injection given per orders. Patient tolerated it well without problems. Vitals stable and discharged home from clinic ambulatory. Follow up as scheduled.

## 2022-03-08 NOTE — Patient Instructions (Signed)
[facility]  Discharge Instructions: Thank you for choosing [facility] to provide your oncology and hematology care.  If you have a lab appointment with the Cancer Center, please come in thru the Main Entrance and check in at the main information desk.  Wear comfortable clothing and clothing appropriate for easy access to any Portacath or PICC line.   We strive to give you quality time with your provider. You may need to reschedule your appointment if you arrive late (15 or more minutes).  Arriving late affects you and other patients whose appointments are after yours.  Also, if you miss three or more appointments without notifying the office, you may be dismissed from the clinic at the provider's discretion.      For prescription refill requests, have your pharmacy contact our office and allow 72 hours for refills to be completed.      To help prevent nausea and vomiting after your treatment, we encourage you to take your nausea medication as directed.  BELOW ARE SYMPTOMS THAT SHOULD BE REPORTED IMMEDIATELY: *FEVER GREATER THAN 100.4 F (38 C) OR HIGHER *CHILLS OR SWEATING *NAUSEA AND VOMITING THAT IS NOT CONTROLLED WITH YOUR NAUSEA MEDICATION *UNUSUAL SHORTNESS OF BREATH *UNUSUAL BRUISING OR BLEEDING *URINARY PROBLEMS (pain or burning when urinating, or frequent urination) *BOWEL PROBLEMS (unusual diarrhea, constipation, pain near the anus) TENDERNESS IN MOUTH AND THROAT WITH OR WITHOUT PRESENCE OF ULCERS (sore throat, sores in mouth, or a toothache) UNUSUAL RASH, SWELLING OR PAIN  UNUSUAL VAGINAL DISCHARGE OR ITCHING   Items with * indicate a potential emergency and should be followed up as soon as possible or go to the Emergency Department if any problems should occur.  Please show the CHEMOTHERAPY ALERT CARD or IMMUNOTHERAPY ALERT CARD at check-in to the Emergency Department and triage nurse.  Should you have questions after your visit or need to  cancel or reschedule your appointment, please contact [facility] [phone]  and follow the prompts.  Office hours are 8:00 a.m. to 4:30 p.m. Monday - Friday. Please note that voicemails left after 4:00 p.m. may not be returned until the following business day.  We are closed weekends and major holidays. You have access to a nurse at all times for urgent questions. Please call the main number to the clinic [phone] and follow the prompts.  For any non-urgent questions, you may also contact your provider using MyChart. We now offer e-Visits for anyone 18 and older to request care online for non-urgent symptoms. For details visit [email or web address].   Also download the MyChart app! Go to the app store, search "MyChart", open the app, select [place], and log in with your MyChart username and password.  Masks are optional in the cancer centers. If you would like for your care team to wear a mask while they are taking care of you, please let them know. For doctor visits, patients may have with them one support person who is at least 70 years old. At this time, visitors are not allowed in the infusion area.  

## 2022-03-15 ENCOUNTER — Inpatient Hospital Stay: Payer: Medicare HMO

## 2022-03-20 ENCOUNTER — Inpatient Hospital Stay: Payer: Medicare HMO

## 2022-03-20 ENCOUNTER — Ambulatory Visit (HOSPITAL_COMMUNITY): Payer: Medicare HMO

## 2022-03-20 VITALS — BP 134/60 | HR 59 | Temp 97.8°F | Resp 18

## 2022-03-20 DIAGNOSIS — D509 Iron deficiency anemia, unspecified: Secondary | ICD-10-CM | POA: Diagnosis not present

## 2022-03-20 DIAGNOSIS — E538 Deficiency of other specified B group vitamins: Secondary | ICD-10-CM | POA: Diagnosis not present

## 2022-03-20 DIAGNOSIS — D500 Iron deficiency anemia secondary to blood loss (chronic): Secondary | ICD-10-CM

## 2022-03-20 MED ORDER — SODIUM CHLORIDE 0.9 % IV SOLN: Freq: Once | INTRAVENOUS | Status: AC

## 2022-03-20 MED ORDER — SODIUM CHLORIDE 0.9 % IV SOLN
300.0000 mg | Freq: Once | INTRAVENOUS | Status: AC
  Administered 2022-03-20: 300 mg via INTRAVENOUS
  Filled 2022-03-20: qty 5

## 2022-03-20 MED ORDER — CYANOCOBALAMIN 1000 MCG/ML IJ SOLN
1000.0000 ug | Freq: Once | INTRAMUSCULAR | Status: AC
  Administered 2022-03-20: 1000 ug via INTRAMUSCULAR
  Filled 2022-03-20: qty 1

## 2022-03-20 NOTE — Progress Notes (Signed)
Patient presents today for Venofer, patient reports taking pre-medications at home, pharmacy aware.  Patient tolerated B12 injection with no complaints voiced. Site clean and dry with no bruising or swelling noted at site. See MAR for details. Band aid applied.  Patient stable during and after injection. Patient tolerated iron infusion with no complaints voiced. Peripheral IV site clean and dry with good blood return noted before and after infusion. Band aid applied. VSS with discharge and left in satisfactory condition with no s/s of distress noted.

## 2022-03-20 NOTE — Patient Instructions (Signed)
[name]  Discharge Instructions: Thank you for choosing [place] to provide your oncology and hematology care.  If you have a lab appointment with the [facility], please come in thru the Main Entrance and check in at the main information desk.  Wear comfortable clothing and clothing appropriate for easy access to any Portacath or PICC line.   We strive to give you quality time with your provider. You may need to reschedule your appointment if you arrive late (15 or more minutes).  Arriving late affects you and other patients whose appointments are after yours.  Also, if you miss three or more appointments without notifying the office, you may be dismissed from the clinic at the provider's discretion.      For prescription refill requests, have your pharmacy contact our office and allow 72 hours for refills to be completed.    Today you received the following Venofer and B12, return as scheduled.   To help prevent nausea and vomiting after your treatment, we encourage you to take your nausea medication as directed.  BELOW ARE SYMPTOMS THAT SHOULD BE REPORTED IMMEDIATELY: *FEVER GREATER THAN 100.4 F (38 C) OR HIGHER *CHILLS OR SWEATING *NAUSEA AND VOMITING THAT IS NOT CONTROLLED WITH YOUR NAUSEA MEDICATION *UNUSUAL SHORTNESS OF BREATH *UNUSUAL BRUISING OR BLEEDING *URINARY PROBLEMS (pain or burning when urinating, or frequent urination) *BOWEL PROBLEMS (unusual diarrhea, constipation, pain near the anus) TENDERNESS IN MOUTH AND THROAT WITH OR WITHOUT PRESENCE OF ULCERS (sore throat, sores in mouth, or a toothache) UNUSUAL RASH, SWELLING OR PAIN  UNUSUAL VAGINAL DISCHARGE OR ITCHING   Items with * indicate a potential emergency and should be followed up as soon as possible or go to the Emergency Department if any problems should occur.  Please show the CHEMOTHERAPY ALERT CARD or IMMUNOTHERAPY ALERT CARD at check-in to the Emergency Department  and triage nurse.  Should you have questions after your visit or need to cancel or reschedule your appointment, please contact [place] [phone]  and follow the prompts.  Office hours are 8:00 a.m. to 4:30 p.m. Monday - Friday. Please note that voicemails left after 4:00 p.m. may not be returned until the following business day.  We are closed weekends and major holidays. You have access to a nurse at all times for urgent questions. Please call the main number to the clinic [phone] and follow the prompts.  For any non-urgent questions, you may also contact your provider using MyChart. We now offer e-Visits for anyone 93 and older to request care online for non-urgent symptoms. For details visit [email or web address].   Also download the MyChart app! Go to the app store, search "MyChart", open the app, select [place], and log in with your MyChart username and password.  Masks are optional in the cancer centers. If you would like for your care team to wear a mask while they are taking care of you, please let them know. You may have one support person who is at least 70 years old accompany you for your appointments.

## 2022-03-25 ENCOUNTER — Inpatient Hospital Stay: Payer: Medicare HMO

## 2022-03-27 ENCOUNTER — Inpatient Hospital Stay: Payer: Medicare HMO

## 2022-03-27 ENCOUNTER — Ambulatory Visit (HOSPITAL_COMMUNITY): Payer: Medicare HMO

## 2022-03-27 VITALS — BP 135/66 | HR 68 | Temp 97.4°F | Resp 18

## 2022-03-27 DIAGNOSIS — D509 Iron deficiency anemia, unspecified: Secondary | ICD-10-CM | POA: Diagnosis not present

## 2022-03-27 DIAGNOSIS — E538 Deficiency of other specified B group vitamins: Secondary | ICD-10-CM | POA: Diagnosis not present

## 2022-03-27 DIAGNOSIS — D500 Iron deficiency anemia secondary to blood loss (chronic): Secondary | ICD-10-CM

## 2022-03-27 MED ORDER — SODIUM CHLORIDE 0.9 % IV SOLN
300.0000 mg | Freq: Once | INTRAVENOUS | Status: AC
  Administered 2022-03-27: 300 mg via INTRAVENOUS
  Filled 2022-03-27: qty 200

## 2022-03-27 MED ORDER — CYANOCOBALAMIN 1000 MCG/ML IJ SOLN
1000.0000 ug | Freq: Once | INTRAMUSCULAR | Status: AC
  Administered 2022-03-27: 1000 ug via INTRAMUSCULAR
  Filled 2022-03-27: qty 1

## 2022-03-27 MED ORDER — SODIUM CHLORIDE 0.9 % IV SOLN: Freq: Once | INTRAVENOUS | Status: AC

## 2022-03-27 NOTE — Patient Instructions (Signed)
[facility] [place]  Discharge Instructions: Thank you for choosing [facility] to provide your oncology and hematology care.  If you have a lab appointment with the Cancer Center, please come in thru the Main Entrance and check in at the main information desk.  Wear comfortable clothing and clothing appropriate for easy access to any Portacath or PICC line.   We strive to give you quality time with your provider. You may need to reschedule your appointment if you arrive late (15 or more minutes).  Arriving late affects you and other patients whose appointments are after yours.  Also, if you miss three or more appointments without notifying the office, you may be dismissed from the clinic at the provider's discretion.      For prescription refill requests, have your pharmacy contact our office and allow 72 hours for refills to be completed.    Today you received the following chemotherapy and/or immunotherapy agents Venofer      To help prevent nausea and vomiting after your treatment, we encourage you to take your nausea medication as directed.  BELOW ARE SYMPTOMS THAT SHOULD BE REPORTED IMMEDIATELY: *FEVER GREATER THAN 100.4 F (38 C) OR HIGHER *CHILLS OR SWEATING *NAUSEA AND VOMITING THAT IS NOT CONTROLLED WITH YOUR NAUSEA MEDICATION *UNUSUAL SHORTNESS OF BREATH *UNUSUAL BRUISING OR BLEEDING *URINARY PROBLEMS (pain or burning when urinating, or frequent urination) *BOWEL PROBLEMS (unusual diarrhea, constipation, pain near the anus) TENDERNESS IN MOUTH AND THROAT WITH OR WITHOUT PRESENCE OF ULCERS (sore throat, sores in mouth, or a toothache) UNUSUAL RASH, SWELLING OR PAIN  UNUSUAL VAGINAL DISCHARGE OR ITCHING   Items with * indicate a potential emergency and should be followed up as soon as possible or go to the Emergency Department if any problems should occur.  Please show the CHEMOTHERAPY ALERT CARD or IMMUNOTHERAPY ALERT CARD at check-in to the Emergency  Department and triage nurse.  Should you have questions after your visit or need to cancel or reschedule your appointment, please contact [facility] [phone]  and follow the prompts.  Office hours are 8:00 a.m. to 4:30 p.m. Monday - Friday. Please note that voicemails left after 4:00 p.m. may not be returned until the following business day.  We are closed weekends and major holidays. You have access to a nurse at all times for urgent questions. Please call the main number to the clinic [phone] and follow the prompts.  For any non-urgent questions, you may also contact your provider using MyChart. We now offer e-Visits for anyone 18 and older to request care online for non-urgent symptoms. For details visit [email or web address].   Also download the MyChart app! Go to the app store, search "MyChart", open the app, select [place], and log in with your MyChart username and password.  Masks are optional in the cancer centers. If you would like for your care team to wear a mask while they are taking care of you, please let them know. You may have one support person who is at least 70 years old accompany you for your appointments.  

## 2022-03-27 NOTE — Progress Notes (Signed)
Patient presents today for Venofer infusion and B12 injection per providers order.  Vital signs WNL.  Patient has no new complaints at this time.  Patient took premedications at home prior to appointment time.  Peripheral IV started and blood return noted pre and post infusion.  Stable during B12 administration without incident; injection site WNL; see MAR for injection details.  Patient tolerated procedure well and without incident.    Stable during infusion without adverse affects.  Vital signs stable.  No complaints at this time.  Discharge from clinic ambulatory in stable condition.  Alert and oriented X 3.  Follow up with [facility] as scheduled.

## 2022-04-17 ENCOUNTER — Ambulatory Visit: Payer: Medicare HMO

## 2022-04-18 DIAGNOSIS — Z20828 Contact with and (suspected) exposure to other viral communicable diseases: Secondary | ICD-10-CM | POA: Diagnosis not present

## 2022-04-24 ENCOUNTER — Ambulatory Visit: Payer: Medicare HMO

## 2022-04-26 ENCOUNTER — Inpatient Hospital Stay: Payer: Medicare HMO

## 2022-04-29 NOTE — Progress Notes (Unsigned)
Cardiology Office Note:    Date:  05/01/2022   ID:  [name], DOB Dec 16, 1950, MRN [id number]  PCP:  Kathyrn Drown, MD  [facility] Providers Cardiologist:  Carlyle Dolly, MD     Referring MD: Kathyrn Drown, MD   Chief Complaint:  Fatigue     History of Present Illness:   [name] is a 71 y.o. female with  history of atypical chest pain felt to be GI 07/2021 and Fe def anemia. Family history of CAD MI 58's.  Patient saw Dr. Harl Bowie 07/2021 and no cardiac w/u unless symptoms changed.     Patient comes in for f/u. No chest pain. She's very tired-has Fe def anemia. Having some numbness on left side and nauseated. Hasn't seen PCP.has been out of work all week. Worried it could be heart related.     Past Medical History:  Diagnosis Date   Arthritis    B12 deficiency 07/09/2015   Crohn disease ([place]) JULY 2011 [place] TI ULCERS seen on CE, NO NSAID USE   Endometriosis    HISTORY  LEADING TO HYSTERECTOMY   Fibromyalgia    Fibromyalgia 12/09/2013   Gastric ulcer    GERD (gastroesophageal reflux disease)    Headache    Helicobacter pylori gastritis    HISTORY   History of hiatal hernia    Hives    Iron (Fe) deficiency anemia 2009   JAN 2012 NL HB & FERRITIN   Obesity    Osteoporosis 11/20/2014   Plantar fasciitis    Current Medications: Current Meds  Medication Sig   acetaminophen (TYLENOL) 325 MG tablet Take 650 mg by mouth as needed for moderate pain or headache.   Vitamin D, Ergocalciferol, (DRISDOL) 1.25 MG (50000 UNIT) CAPS capsule TAKE 1 CAPSULE BY MOUTH ONE TIME PER WEEK    Allergies:   Cefzil [cefprozil], Celebrex [celecoxib], and Sulfonamide derivatives   Social History   Tobacco Use   Smoking status: Never   Smokeless tobacco: Never  Vaping Use   Vaping Use: Never used  Substance Use Topics   Alcohol use: No   Drug use: No    Family Hx: The patient's family history includes Cancer in her father; Diabetes in her brother,  father, mother, and sister. There is no history of Colon polyps, Colon cancer, or Stroke.  ROS   EKGs/Labs/Other Test Reviewed:    EKG:  EKG is  ordered today.   Sinus brady 58/m  Recent Labs: 06/27/2021: ALT 19; BUN 16; Creatinine, Ser 0.64; Potassium 3.9; Sodium 137 02/13/2022: Hemoglobin 12.9; Platelets 211   Recent Lipid Panel No results for input(s): "CHOL", "TRIG", "HDL", "VLDL", "LDLCALC", "LDLDIRECT" in the last 8760 hours.   Prior CV Studies:        Risk Assessment/Calculations/Metrics:              Physical Exam:    VS:  BP 128/78   Pulse (!) 58   Ht _0  (1.626 m)   Wt 215 lb 12.8 oz (97.9 kg)   SpO2 94%   BMI 37.04 kg/m     Wt Readings from Last 3 Encounters:  05/01/22 215 lb 12.8 oz (97.9 kg)  02/20/22 196 lb 9.6 oz (89.2 kg)  01/18/22 200 lb (90.7 kg)    Physical Exam  GEN: Obese, in no acute distress  Neck: no JVD, carotid bruits, or masses Cardiac:RRR; no murmurs, rubs, or gallops  Respiratory:  clear to auscultation bilaterally, normal work  of breathing GI: soft, nontender, nondistended, + BS Ext: without cyanosis, clubbing, or edema, Good distal pulses bilaterally Neuro:  Alert and Oriented x 3,  Psych: euthymic mood, full affect       ASSESSMENT & PLAN:   No problem-specific Assessment & Plan notes found for this encounter.   Chest pain felt to be atypical in the past and suggestive of GI. No further chest pain. She's concerned fatigue is cardiac. Will order coronary calcium score self pay. Check FLP.  Family history of CAD.  Nausea with history of HH-she will try OTC prilosec  Fatigue with left arm numbness. Long history of Fe def anemia and B12. Gets monthly injections and followed by heme/onc. Will check labs including TSH. Reassured her it's not cardiac related.          Dispo:  Return in about 1 year (around 05/02/2023) for Routine follow up in 1 year with Dr. Harl Bowie.   Medication Adjustments/Labs and Tests Ordered: Current  medicines are reviewed at length with the patient today.  Concerns regarding medicines are outlined above.  Tests Ordered: Orders Placed This Encounter  Procedures   CT CARDIAC SCORING (SELF PAY ONLY)   CBC   Comp Met (CMET)   TSH   B12   EKG 12-Lead   Medication Changes: No orders of the defined types were placed in this encounter.  Signed, Ermalinda Barrios, PA-C  05/01/2022 9:17 AM    [facility] [place], [place], [place]  [address] Phone: [phone]; Fax: [phone]

## 2022-05-01 ENCOUNTER — Encounter: Payer: Self-pay | Admitting: Physician Assistant

## 2022-05-01 ENCOUNTER — Ambulatory Visit: Payer: Medicare HMO | Attending: Physician Assistant | Admitting: Physician Assistant

## 2022-05-01 VITALS — BP 128/78 | HR 58 | Ht 64.0 in | Wt 215.8 lb

## 2022-05-01 DIAGNOSIS — R110 Nausea: Secondary | ICD-10-CM | POA: Diagnosis not present

## 2022-05-01 DIAGNOSIS — D649 Anemia, unspecified: Secondary | ICD-10-CM

## 2022-05-01 DIAGNOSIS — Z8249 Family history of ischemic heart disease and other diseases of the circulatory system: Secondary | ICD-10-CM | POA: Diagnosis not present

## 2022-05-01 DIAGNOSIS — R200 Anesthesia of skin: Secondary | ICD-10-CM | POA: Diagnosis not present

## 2022-05-01 DIAGNOSIS — Z87898 Personal history of other specified conditions: Secondary | ICD-10-CM

## 2022-05-01 NOTE — Patient Instructions (Signed)
Medication Instructions:   Your physician recommends that you continue on your current medications as directed. Please refer to the Current Medication list given to you today.   *If you need a refill on your cardiac medications before your next appointment, please call your pharmacy*   Lab Work:  TODAY!!!! CBC/CMET/TSH/VIT B12  If you have labs (blood work) drawn today and your tests are completely normal, you will receive your results only by: [facility] (if you have MyChart) OR A paper copy in the mail If you have any lab test that is abnormal or we need to change your treatment, we will call you to review the results.   Testing/Procedures:  Self Pay Calcium Scoring at [facility].   Follow-Up: At [facility], you and your health needs are our priority.  As part of our continuing mission to provide you with exceptional heart care, we have created designated Provider Care Teams.  These Care Teams include your primary Cardiologist (physician) and Advanced Practice Providers (APPs -  Physician Assistants and Nurse Practitioners) who all work together to provide you with the care you need, when you need it.  We recommend signing up for the patient portal called "MyChart".  Sign up information is provided on this After Visit Summary.  MyChart is used to connect with patients for Virtual Visits (Telemedicine).  Patients are able to view lab/test results, encounter notes, upcoming appointments, etc.  Non-urgent messages can be sent to your provider as well.   To learn more about what you can do with MyChart, go to [email or web address].    Your next appointment:   1 year(s)  The format for your next appointment:   In Person  Provider:   Carlyle Dolly, MD    Other Instructions  Your physician wants you to follow-up in: 1 year follow up with Dr. Harl Bowie. You will receive a reminder letter in the mail two months in advance. If you don't receive a letter, please call  our office to schedule the follow-up appointment.   Important Information About Sugar

## 2022-05-02 LAB — CBC
Hematocrit: 38.8 % (ref 34.0–46.6)
Hemoglobin: 13.1 g/dL (ref 11.1–15.9)
MCH: 29.6 pg (ref 26.6–33.0)
MCHC: 33.8 g/dL (ref 31.5–35.7)
MCV: 88 fL (ref 79–97)
Platelets: 252 10*3/uL (ref 150–450)
RBC: 4.42 x10E6/uL (ref 3.77–5.28)
RDW: 13.4 % (ref 11.7–15.4)
WBC: 7.7 10*3/uL (ref 3.4–10.8)

## 2022-05-02 LAB — COMPREHENSIVE METABOLIC PANEL
ALT: 15 IU/L (ref 0–32)
AST: 12 IU/L (ref 0–40)
Albumin/Globulin Ratio: 1.6 (ref 1.2–2.2)
Albumin: 4.2 g/dL (ref 3.9–4.9)
Alkaline Phosphatase: 113 IU/L (ref 44–121)
BUN/Creatinine Ratio: 27 (ref 12–28)
BUN: 16 mg/dL (ref 8–27)
Bilirubin Total: 0.3 mg/dL (ref 0.0–1.2)
CO2: 23 mmol/L (ref 20–29)
Calcium: 9.4 mg/dL (ref 8.7–10.3)
Chloride: 105 mmol/L (ref 96–106)
Creatinine, Ser: 0.6 mg/dL (ref 0.57–1.00)
Globulin, Total: 2.6 g/dL (ref 1.5–4.5)
Glucose: 91 mg/dL (ref 70–99)
Potassium: 4.3 mmol/L (ref 3.5–5.2)
Sodium: 140 mmol/L (ref 134–144)
Total Protein: 6.8 g/dL (ref 6.0–8.5)
eGFR: 96 mL/min/{1.73_m2} (ref >59)

## 2022-05-02 LAB — TSH: TSH: 0.998 u[IU]/mL (ref 0.450–4.500)

## 2022-05-02 LAB — VITAMIN B12: Vitamin B-12: 307 pg/mL (ref 232–1245)

## 2022-05-08 ENCOUNTER — Encounter: Payer: Self-pay | Admitting: Family Medicine

## 2022-05-08 ENCOUNTER — Ambulatory Visit (INDEPENDENT_AMBULATORY_CARE_PROVIDER_SITE_OTHER): Payer: Medicare HMO | Admitting: Family Medicine

## 2022-05-08 VITALS — BP 128/82 | Wt 217.4 lb

## 2022-05-08 DIAGNOSIS — Z79899 Other long term (current) drug therapy: Secondary | ICD-10-CM

## 2022-05-08 DIAGNOSIS — K449 Diaphragmatic hernia without obstruction or gangrene: Secondary | ICD-10-CM | POA: Diagnosis not present

## 2022-05-08 DIAGNOSIS — R1013 Epigastric pain: Secondary | ICD-10-CM

## 2022-05-08 MED ORDER — PANTOPRAZOLE SODIUM 40 MG PO TBEC: 40.0000 mg | DELAYED_RELEASE_TABLET | Freq: Every day | ORAL | 3 refills | Status: AC

## 2022-05-08 NOTE — Progress Notes (Signed)
   Subjective:    Patient ID: [name], female    DOB: [birth year], 71 y.o.   MRN: [id number]  HPI Pt arrives due to abdominal pain under breast area. Pt has hernia. Pt did see cardiology and they ruled out heart issues. Pt states she does have nausea at time;sometimes issues with issue due to Chron's.    This patient is having significant trouble with abdominal pain that radiates up into the chest she is already seen cardiology they have her set up for coronary calcium but told her it was not her heart she has a history of a large hiatal hernia she denies any vomiting or choking but does states she has heartburn issues intermittently and epigastric pain she denies sweats chills bloody stools denies hematochezia or hematemesis Review of Systems     Objective:   Physical Exam  Lungs clear hearts regular pulse normal abdomen is soft with moderate epigastric pain discomfort      Assessment & Plan:  Hiatal hernia Referral to surgery for further evaluation Patient would like to get this handled right away but this issue was brought to her attention late last year by urgent care and they set her up with a specialist-she went and saw surgeon in [place] she just really did not like the approach there I have encouraged her to see Dr. Constance Haw for further evaluation and discussion  The abdominal pain is most likely related to gastritis related to the hiatal hernia but we will go ahead with lab work as well I do not feel the patient needs a CAT scan right at the moment but surgery may decide to do so Lab work is ordered await the results

## 2022-05-09 LAB — CBC WITH DIFFERENTIAL/PLATELET
Basophils Absolute: 0.1 10*3/uL (ref 0.0–0.2)
Basos: 1 %
EOS (ABSOLUTE): 0.1 10*3/uL (ref 0.0–0.4)
Eos: 1 %
Hematocrit: 39.6 % (ref 34.0–46.6)
Hemoglobin: 13 g/dL (ref 11.1–15.9)
Immature Grans (Abs): 0 10*3/uL (ref 0.0–0.1)
Immature Granulocytes: 0 %
Lymphocytes Absolute: 1.5 10*3/uL (ref 0.7–3.1)
Lymphs: 26 %
MCH: 28.9 pg (ref 26.6–33.0)
MCHC: 32.8 g/dL (ref 31.5–35.7)
MCV: 88 fL (ref 79–97)
Monocytes Absolute: 0.5 10*3/uL (ref 0.1–0.9)
Monocytes: 8 %
Neutrophils Absolute: 3.7 10*3/uL (ref 1.4–7.0)
Neutrophils: 64 %
Platelets: 241 10*3/uL (ref 150–450)
RBC: 4.5 x10E6/uL (ref 3.77–5.28)
RDW: 13.1 % (ref 11.7–15.4)
WBC: 5.8 10*3/uL (ref 3.4–10.8)

## 2022-05-09 LAB — BASIC METABOLIC PANEL
BUN/Creatinine Ratio: 23 (ref 12–28)
BUN: 14 mg/dL (ref 8–27)
CO2: 21 mmol/L (ref 20–29)
Calcium: 9.2 mg/dL (ref 8.7–10.3)
Chloride: 106 mmol/L (ref 96–106)
Creatinine, Ser: 0.61 mg/dL (ref 0.57–1.00)
Glucose: 99 mg/dL (ref 70–99)
Potassium: 4.3 mmol/L (ref 3.5–5.2)
Sodium: 141 mmol/L (ref 134–144)
eGFR: 96 mL/min/{1.73_m2} (ref >59)

## 2022-05-09 LAB — HEPATIC FUNCTION PANEL
ALT: 14 IU/L (ref 0–32)
AST: 17 IU/L (ref 0–40)
Albumin: 4.5 g/dL (ref 3.9–4.9)
Alkaline Phosphatase: 112 IU/L (ref 44–121)
Bilirubin Total: 0.3 mg/dL (ref 0.0–1.2)
Bilirubin, Direct: <0.1 mg/dL (ref 0.00–0.40)
Total Protein: 7 g/dL (ref 6.0–8.5)

## 2022-05-09 LAB — LIPASE: Lipase: 47 U/L (ref 14–72)

## 2022-05-13 ENCOUNTER — Telehealth: Payer: Self-pay | Admitting: Family Medicine

## 2022-05-13 NOTE — Telephone Encounter (Signed)
Nurses-recently patient came to the office with a large hiatal hernia and she was interested in seeing  I communicated with Dr. Constance Haw who recommended that she be seen at a tertiary hospital.  Based on this please inform the patient that Dr. Constance Haw does not handle these because these type of surgeries are to intricate for our hospital staff here therefore Dr. Constance Haw recommends [facility] on alternative is [place] out from the patient which way she would like to be referred I would recommend [facility] surgical evaluation at [facility] See Dr. Constance Haw comments below   Dr. Constance Haw for surgeryBridges, Lanell Matar, MD  Kathyrn Drown, MD; Krystal Clark, [place], [place]; Six, Christina H, LPN Dr. Wolfgang Phoenix,   If it is a hiatal hernia, none of [place] do foregut surgery here. The repairs can get very large and complicated and the work up for verifying exactly what you need to do is difficult to get in the system.  She will need to go to [facility] or [facility].   Ria Comment .  She had previously been at [facility] surgery and did not want to follow-up with them

## 2022-05-15 NOTE — Telephone Encounter (Signed)
Left message to return call 

## 2022-05-22 ENCOUNTER — Inpatient Hospital Stay: Payer: Medicare HMO | Attending: Hematology

## 2022-05-22 VITALS — BP 135/50 | HR 66 | Resp 18

## 2022-05-22 DIAGNOSIS — E559 Vitamin D deficiency, unspecified: Secondary | ICD-10-CM | POA: Insufficient documentation

## 2022-05-22 DIAGNOSIS — E538 Deficiency of other specified B group vitamins: Secondary | ICD-10-CM | POA: Insufficient documentation

## 2022-05-22 DIAGNOSIS — D500 Iron deficiency anemia secondary to blood loss (chronic): Secondary | ICD-10-CM

## 2022-05-22 DIAGNOSIS — D508 Other iron deficiency anemias: Secondary | ICD-10-CM | POA: Insufficient documentation

## 2022-05-22 MED ORDER — CYANOCOBALAMIN 1000 MCG/ML IJ SOLN
1000.0000 ug | Freq: Once | INTRAMUSCULAR | Status: AC
  Administered 2022-05-22: 1000 ug via INTRAMUSCULAR
  Filled 2022-05-22: qty 1

## 2022-05-22 NOTE — Patient Instructions (Signed)
[place]  Discharge Instructions: Thank you for choosing [facility] to provide your oncology and hematology care.  If you have a lab appointment with the [facility], please come in thru the Main Entrance and check in at the main information desk.  Wear comfortable clothing and clothing appropriate for easy access to any Portacath or PICC line.   We strive to give you quality time with your provider. You may need to reschedule your appointment if you arrive late (15 or more minutes).  Arriving late affects you and other patients whose appointments are after yours.  Also, if you miss three or more appointments without notifying the office, you may be dismissed from the clinic at the provider's discretion.      For prescription refill requests, have your pharmacy contact our office and allow 72 hours for refills to be completed.    Today you received the following B12 injection, return as scheduled.   To help prevent nausea and vomiting after your treatment, we encourage you to take your nausea medication as directed.  BELOW ARE SYMPTOMS THAT SHOULD BE REPORTED IMMEDIATELY: *FEVER GREATER THAN 100.4 F (38 C) OR HIGHER *CHILLS OR SWEATING *NAUSEA AND VOMITING THAT IS NOT CONTROLLED WITH YOUR NAUSEA MEDICATION *UNUSUAL SHORTNESS OF BREATH *UNUSUAL BRUISING OR BLEEDING *URINARY PROBLEMS (pain or burning when urinating, or frequent urination) *BOWEL PROBLEMS (unusual diarrhea, constipation, pain near the anus) TENDERNESS IN MOUTH AND THROAT WITH OR WITHOUT PRESENCE OF ULCERS (sore throat, sores in mouth, or a toothache) UNUSUAL RASH, SWELLING OR PAIN  UNUSUAL VAGINAL DISCHARGE OR ITCHING   Items with * indicate a potential emergency and should be followed up as soon as possible or go to the Emergency Department if any problems should occur.  Please show the CHEMOTHERAPY ALERT CARD or IMMUNOTHERAPY ALERT CARD at check-in to the Emergency Department and  triage nurse.  Should you have questions after your visit or need to cancel or reschedule your appointment, please contact [facility] [phone]  and follow the prompts.  Office hours are 8:00 a.m. to 4:30 p.m. Monday - Friday. Please note that voicemails left after 4:00 p.m. may not be returned until the following business day.  We are closed weekends and major holidays. You have access to a nurse at all times for urgent questions. Please call the main number to the clinic [phone] and follow the prompts.  For any non-urgent questions, you may also contact your provider using MyChart. We now offer e-Visits for anyone 27 and older to request care online for non-urgent symptoms. For details visit [email or web address].   Also download the MyChart app! Go to the app store, search "MyChart", open the app, select [facility], and log in with your MyChart username and password.  Masks are optional in the cancer centers. If you would like for your care team to wear a mask while they are taking care of you, please let them know. You may have one support person who is at least 70 years old accompany you for your appointments.

## 2022-05-22 NOTE — Progress Notes (Signed)
Patient tolerated B12 injection with no complaints voiced. Site clean and dry with no bruising or swelling noted at site. See MAR for details. Band aid applied.  Patient stable during and after injection. VSS with discharge and left in satisfactory condition with no s/s of distress noted.

## 2022-05-28 NOTE — Telephone Encounter (Signed)
Left message to return call 

## 2022-05-30 NOTE — Telephone Encounter (Signed)
3 unsuccessful attempts to reach pt

## 2022-06-11 ENCOUNTER — Other Ambulatory Visit: Payer: Self-pay | Admitting: *Deleted

## 2022-06-11 ENCOUNTER — Telehealth: Payer: Self-pay | Admitting: *Deleted

## 2022-06-11 DIAGNOSIS — D500 Iron deficiency anemia secondary to blood loss (chronic): Secondary | ICD-10-CM

## 2022-06-11 NOTE — Telephone Encounter (Signed)
Patient called to advise that she has absolutely no energy and feels extremely rundown.  Scheduled for labs in the morning.  Tarri Abernethy PA-C notified and will treat accordingly.

## 2022-06-12 ENCOUNTER — Inpatient Hospital Stay: Payer: Medicare HMO | Attending: Hematology

## 2022-06-12 ENCOUNTER — Other Ambulatory Visit (HOSPITAL_COMMUNITY): Payer: Medicare HMO

## 2022-06-12 DIAGNOSIS — K5090 Crohn's disease, unspecified, without complications: Secondary | ICD-10-CM | POA: Insufficient documentation

## 2022-06-12 DIAGNOSIS — E559 Vitamin D deficiency, unspecified: Secondary | ICD-10-CM | POA: Diagnosis not present

## 2022-06-12 DIAGNOSIS — K909 Intestinal malabsorption, unspecified: Secondary | ICD-10-CM | POA: Diagnosis not present

## 2022-06-12 DIAGNOSIS — D508 Other iron deficiency anemias: Secondary | ICD-10-CM | POA: Insufficient documentation

## 2022-06-12 DIAGNOSIS — E538 Deficiency of other specified B group vitamins: Secondary | ICD-10-CM | POA: Insufficient documentation

## 2022-06-12 DIAGNOSIS — D500 Iron deficiency anemia secondary to blood loss (chronic): Secondary | ICD-10-CM

## 2022-06-12 LAB — CBC
HCT: 42.4 % (ref 36.0–46.0)
Hemoglobin: 13.9 g/dL (ref 12.0–15.0)
MCH: 29.3 pg (ref 26.0–34.0)
MCHC: 32.8 g/dL (ref 30.0–36.0)
MCV: 89.5 fL (ref 80.0–100.0)
Platelets: 209 10*3/uL (ref 150–400)
RBC: 4.74 MIL/uL (ref 3.87–5.11)
RDW: 12.7 % (ref 11.5–15.5)
WBC: 6.2 10*3/uL (ref 4.0–10.5)
nRBC: 0 % (ref 0.0–0.2)

## 2022-06-12 LAB — IRON AND TIBC
Iron: 45 ug/dL (ref 28–170)
Saturation Ratios: 16 % (ref 10.4–31.8)
TIBC: 284 ug/dL (ref 250–450)
UIBC: 239 ug/dL

## 2022-06-12 LAB — FERRITIN: Ferritin: 127 ng/mL (ref 11–307)

## 2022-06-13 ENCOUNTER — Telehealth: Payer: Self-pay | Admitting: *Deleted

## 2022-06-13 NOTE — Telephone Encounter (Signed)
Notified patient that her recent lab work for IDA was normal and did not warrant iron infusions.  Advised that she should consult her PCP to explore other options for her profound fatigue and "feeling awful" in general.  Verbalized understanding and will contact PCP.

## 2022-06-18 ENCOUNTER — Inpatient Hospital Stay: Payer: Medicare HMO

## 2022-06-18 VITALS — BP 132/48 | HR 70 | Temp 97.9°F | Resp 16

## 2022-06-18 DIAGNOSIS — E538 Deficiency of other specified B group vitamins: Secondary | ICD-10-CM | POA: Diagnosis not present

## 2022-06-18 DIAGNOSIS — D508 Other iron deficiency anemias: Secondary | ICD-10-CM | POA: Diagnosis not present

## 2022-06-18 DIAGNOSIS — K909 Intestinal malabsorption, unspecified: Secondary | ICD-10-CM | POA: Diagnosis not present

## 2022-06-18 DIAGNOSIS — K5090 Crohn's disease, unspecified, without complications: Secondary | ICD-10-CM | POA: Diagnosis not present

## 2022-06-18 DIAGNOSIS — E559 Vitamin D deficiency, unspecified: Secondary | ICD-10-CM | POA: Diagnosis not present

## 2022-06-18 DIAGNOSIS — D500 Iron deficiency anemia secondary to blood loss (chronic): Secondary | ICD-10-CM

## 2022-06-18 MED ORDER — CYANOCOBALAMIN 1000 MCG/ML IJ SOLN
1000.0000 ug | Freq: Once | INTRAMUSCULAR | Status: AC
  Administered 2022-06-18: 1000 ug via INTRAMUSCULAR
  Filled 2022-06-18: qty 1

## 2022-06-18 NOTE — Progress Notes (Signed)
Patient tolerated injection with no complaints voiced. Site clean and dry with no bruising or swelling noted at site. See MAR for details. Band aid applied.  Patient stable during and after injection. VSS with discharge and left in satisfactory condition with no s/s of distress noted.

## 2022-06-18 NOTE — Patient Instructions (Signed)
[place]  Discharge Instructions: Thank you for choosing [facility] to provide your oncology and hematology care.  If you have a lab appointment with the [facility], please come in thru the Main Entrance and check in at the main information desk.  Wear comfortable clothing and clothing appropriate for easy access to any Portacath or PICC line.   We strive to give you quality time with your provider. You may need to reschedule your appointment if you arrive late (15 or more minutes).  Arriving late affects you and other patients whose appointments are after yours.  Also, if you miss three or more appointments without notifying the office, you may be dismissed from the clinic at the provider's discretion.      For prescription refill requests, have your pharmacy contact our office and allow 72 hours for refills to be completed.    Today you received the following B12 injection, return as scheduled.   To help prevent nausea and vomiting after your treatment, we encourage you to take your nausea medication as directed.  BELOW ARE SYMPTOMS THAT SHOULD BE REPORTED IMMEDIATELY: *FEVER GREATER THAN 100.4 F (38 C) OR HIGHER *CHILLS OR SWEATING *NAUSEA AND VOMITING THAT IS NOT CONTROLLED WITH YOUR NAUSEA MEDICATION *UNUSUAL SHORTNESS OF BREATH *UNUSUAL BRUISING OR BLEEDING *URINARY PROBLEMS (pain or burning when urinating, or frequent urination) *BOWEL PROBLEMS (unusual diarrhea, constipation, pain near the anus) TENDERNESS IN MOUTH AND THROAT WITH OR WITHOUT PRESENCE OF ULCERS (sore throat, sores in mouth, or a toothache) UNUSUAL RASH, SWELLING OR PAIN  UNUSUAL VAGINAL DISCHARGE OR ITCHING   Items with * indicate a potential emergency and should be followed up as soon as possible or go to the Emergency Department if any problems should occur.  Please show the CHEMOTHERAPY ALERT CARD or IMMUNOTHERAPY ALERT CARD at check-in to the Emergency Department and  triage nurse.  Should you have questions after your visit or need to cancel or reschedule your appointment, please contact [facility] [phone]  and follow the prompts.  Office hours are 8:00 a.m. to 4:30 p.m. Monday - Friday. Please note that voicemails left after 4:00 p.m. may not be returned until the following business day.  We are closed weekends and major holidays. You have access to a nurse at all times for urgent questions. Please call the main number to the clinic [phone] and follow the prompts.  For any non-urgent questions, you may also contact your provider using MyChart. We now offer e-Visits for anyone 66 and older to request care online for non-urgent symptoms. For details visit [email or web address].   Also download the MyChart app! Go to the app store, search "MyChart", open the app, select [facility], and log in with your MyChart username and password.  Masks are optional in the cancer centers. If you would like for your care team to wear a mask while they are taking care of you, please let them know. You may have one support person who is at least 70 years old accompany you for your appointments.

## 2022-06-19 ENCOUNTER — Inpatient Hospital Stay: Payer: Medicare HMO

## 2022-06-26 DIAGNOSIS — H43813 Vitreous degeneration, bilateral: Secondary | ICD-10-CM | POA: Diagnosis not present

## 2022-06-26 DIAGNOSIS — H33312 Horseshoe tear of retina without detachment, left eye: Secondary | ICD-10-CM | POA: Diagnosis not present

## 2022-06-26 DIAGNOSIS — H4312 Vitreous hemorrhage, left eye: Secondary | ICD-10-CM | POA: Diagnosis not present

## 2022-06-28 ENCOUNTER — Other Ambulatory Visit (HOSPITAL_COMMUNITY): Payer: Medicare HMO

## 2022-06-28 ENCOUNTER — Other Ambulatory Visit: Payer: Medicare HMO

## 2022-07-05 ENCOUNTER — Telehealth: Payer: Medicare HMO | Admitting: Physician Assistant

## 2022-07-09 ENCOUNTER — Ambulatory Visit: Payer: Medicare HMO | Admitting: Family Medicine

## 2022-07-15 ENCOUNTER — Inpatient Hospital Stay: Payer: Medicare HMO

## 2022-07-16 ENCOUNTER — Inpatient Hospital Stay: Payer: Medicare HMO | Attending: Hematology

## 2022-07-16 ENCOUNTER — Inpatient Hospital Stay: Payer: Medicare HMO

## 2022-07-16 DIAGNOSIS — K909 Intestinal malabsorption, unspecified: Secondary | ICD-10-CM | POA: Diagnosis not present

## 2022-07-16 DIAGNOSIS — E538 Deficiency of other specified B group vitamins: Secondary | ICD-10-CM | POA: Insufficient documentation

## 2022-07-16 DIAGNOSIS — E559 Vitamin D deficiency, unspecified: Secondary | ICD-10-CM | POA: Insufficient documentation

## 2022-07-16 DIAGNOSIS — K5090 Crohn's disease, unspecified, without complications: Secondary | ICD-10-CM | POA: Diagnosis not present

## 2022-07-16 DIAGNOSIS — D508 Other iron deficiency anemias: Secondary | ICD-10-CM | POA: Diagnosis not present

## 2022-07-16 DIAGNOSIS — Z79899 Other long term (current) drug therapy: Secondary | ICD-10-CM | POA: Diagnosis not present

## 2022-07-16 DIAGNOSIS — D500 Iron deficiency anemia secondary to blood loss (chronic): Secondary | ICD-10-CM

## 2022-07-16 LAB — CBC WITH DIFFERENTIAL/PLATELET
Abs Immature Granulocytes: 0.01 10*3/uL (ref 0.00–0.07)
Basophils Absolute: 0 10*3/uL (ref 0.0–0.1)
Basophils Relative: 1 %
Eosinophils Absolute: 0 10*3/uL (ref 0.0–0.5)
Eosinophils Relative: 1 %
HCT: 39.8 % (ref 36.0–46.0)
Hemoglobin: 13.1 g/dL (ref 12.0–15.0)
Immature Granulocytes: 0 %
Lymphocytes Relative: 27 %
Lymphs Abs: 1.3 10*3/uL (ref 0.7–4.0)
MCH: 28.9 pg (ref 26.0–34.0)
MCHC: 32.9 g/dL (ref 30.0–36.0)
MCV: 87.7 fL (ref 80.0–100.0)
Monocytes Absolute: 0.4 10*3/uL (ref 0.1–1.0)
Monocytes Relative: 8 %
Neutro Abs: 3 10*3/uL (ref 1.7–7.7)
Neutrophils Relative %: 63 %
Platelets: 208 10*3/uL (ref 150–400)
RBC: 4.54 MIL/uL (ref 3.87–5.11)
RDW: 13.4 % (ref 11.5–15.5)
WBC: 4.7 10*3/uL (ref 4.0–10.5)
nRBC: 0 % (ref 0.0–0.2)

## 2022-07-16 LAB — VITAMIN B12: Vitamin B-12: 218 pg/mL (ref 180–914)

## 2022-07-16 LAB — IRON AND TIBC
Iron: 43 ug/dL (ref 28–170)
Saturation Ratios: 15 % (ref 10.4–31.8)
TIBC: 294 ug/dL (ref 250–450)
UIBC: 251 ug/dL

## 2022-07-16 LAB — FERRITIN: Ferritin: 89 ng/mL (ref 11–307)

## 2022-07-16 NOTE — Progress Notes (Unsigned)
Virtual Visit via Telephone Note [facility]  I connected with [name]  on 07/17/22 at  9:45 PM by telephone and verified that I am speaking with the correct person using two identifiers.  Location: Patient: Home Provider: [facility]   I discussed the limitations, risks, security and privacy concerns of performing an evaluation and management service by telephone and the availability of in person appointments. I also discussed with the patient that there may be a patient responsible charge related to this service. The patient expressed understanding and agreed to proceed.  REASON FOR VISIT:  Follow-up for iron deficiency anemia and vitamin B12 deficiency   CURRENT THERAPY: Intermittent IV iron + vitamin B12 injections  INTERVAL HISTORY:  [name] 71 y.o. female returns for routine follow-up of her iron deficiency anemia.  She was last seen by Tarri Abernethy PA-C on 02/20/2022.   At today's visit, she reports feeling somewhat poorly due to ongoing fatigue.  No recent hospitalizations, surgeries, or changes in baseline health status.   She had improved energy after her IV (Venofer 300 mg x 2) in August 2023, but reports that she is starting to feel low again over the past month. She currently reports worsening fatigue with energy about 25%.  She denies any pica, restless legs, headaches, chest pain, dyspnea on exertion, lightheadedness, or syncope.    Her Crohn's disease remains relatively well controlled, and she reports that she has not had any recent flareups.  She denies any blood loss such as hematemesis, hematochezia, melena, or epistaxis.  She continues to take her weekly vitamin D.  She has been receiving monthly vitamin B12 injections since July 2023.    She has 25% energy and 100% appetite. She endorses that she is maintaining a stable weight.    OBSERVATIONS/OBJECTIVE:  Review of Systems  Constitutional:  Positive for malaise/fatigue.  Negative for chills, diaphoresis, fever and weight loss.  Respiratory:  Negative for cough and shortness of breath.   Cardiovascular:  Negative for chest pain and palpitations.  Gastrointestinal:  Negative for abdominal pain, blood in stool, melena, nausea and vomiting.  Neurological:  Negative for dizziness and headaches.     PHYSICAL EXAM (per limitations of virtual telephone visit): The patient is alert and oriented x 3, exhibiting adequate mentation, good mood, and ability to speak in full sentences and execute sound judgement.   ASSESSMENT & PLAN: 1.  Iron deficiency anemia  - Secondary to chronic blood loss and malabsorption in the setting of inflammatory bowel disease - Most recent IV iron with 300 mg x 2 in August 2023 - No hematemesis, hematochezia, or melena    - Symptomatic with significant fatigue (energy 25%), which was improved after her most recent IV iron but has started to worsen again - Most recent labs (07/16/2022): Hgb 13.1, ferritin 89, iron saturation 15% - PLAN: Recommend IV Venofer 300 mg x 3 doses (symptomatic iron deficiency with iron saturation < 20%, ferritin <100)    - Repeat labs and RTC in 4 months.      2.  B12 deficiency - She has been receiving monthly B12 injections since July/August 2023 (only missed injection was September 2023) - Most recent B12 (07/16/2022): Vitamin B12 218, MMA pending.   - She is symptomatic with significant fatigue - PLAN: Recommend increase frequency of B12 injections every 3 weeks (rather than monthly) - We will check a B12/methylmalonic acid at follow-up visit in 4 months.  3.  Vitamin  D deficiency - Currently taking vitamin D 50,000 units weekly    - Most recent vitamin D (10/09/2021) is normal at 50.97 - PLAN: Continue weekly vitamin D.  Recheck annually (March 2024)   4.  Crohn's disease - No active treatment - Last flare was 3+ years ago   PLAN SUMMARY: >> Venofer 300 mg x 3 >> B12 injections every THREE weeks >>  Labs in 4 months (CBC/D, ferritin, iron/TIBC, B12, MMA, vitamin D) >>  PHONE visit after labs   I discussed the assessment and treatment plan with the patient. The patient was provided an opportunity to ask questions and all were answered. The patient agreed with the plan and demonstrated an understanding of the instructions.   The patient was advised to call back or seek an in-person evaluation if the symptoms worsen or if the condition fails to improve as anticipated.  I provided 18 minutes of non-face-to-face time during this encounter.   Harriett Rush, PA-C 07/17/22 10:29 AM

## 2022-07-17 ENCOUNTER — Other Ambulatory Visit: Payer: Self-pay

## 2022-07-17 ENCOUNTER — Inpatient Hospital Stay: Payer: Medicare HMO

## 2022-07-17 ENCOUNTER — Inpatient Hospital Stay (HOSPITAL_BASED_OUTPATIENT_CLINIC_OR_DEPARTMENT_OTHER): Payer: Medicare HMO | Admitting: Physician Assistant

## 2022-07-17 DIAGNOSIS — E538 Deficiency of other specified B group vitamins: Secondary | ICD-10-CM | POA: Diagnosis not present

## 2022-07-17 DIAGNOSIS — E559 Vitamin D deficiency, unspecified: Secondary | ICD-10-CM | POA: Diagnosis not present

## 2022-07-17 DIAGNOSIS — D500 Iron deficiency anemia secondary to blood loss (chronic): Secondary | ICD-10-CM

## 2022-07-17 MED ORDER — VITAMIN D (ERGOCALCIFEROL) 1.25 MG (50000 UNIT) PO CAPS: ORAL_CAPSULE | ORAL | 4 refills | Status: DC

## 2022-07-19 ENCOUNTER — Inpatient Hospital Stay: Payer: Medicare HMO | Admitting: Physician Assistant

## 2022-07-19 ENCOUNTER — Inpatient Hospital Stay: Payer: Medicare HMO

## 2022-07-19 LAB — METHYLMALONIC ACID, SERUM: Methylmalonic Acid, Quantitative: 153 nmol/L (ref 0–378)

## 2022-07-27 ENCOUNTER — Encounter: Payer: Self-pay | Admitting: Emergency Medicine

## 2022-07-27 ENCOUNTER — Other Ambulatory Visit: Payer: Self-pay

## 2022-07-27 ENCOUNTER — Emergency Department: Payer: Medicare HMO

## 2022-07-27 ENCOUNTER — Emergency Department
Admission: EM | Admit: 2022-07-27 | Discharge: 2022-07-27 | Disposition: A | Discharge status: Home / Self Care | Payer: Medicare HMO | Attending: Emergency Medicine | Admitting: Emergency Medicine

## 2022-07-27 DIAGNOSIS — K449 Diaphragmatic hernia without obstruction or gangrene: Secondary | ICD-10-CM | POA: Diagnosis not present

## 2022-07-27 DIAGNOSIS — Z1152 Encounter for screening for COVID-19: Secondary | ICD-10-CM | POA: Diagnosis not present

## 2022-07-27 DIAGNOSIS — J101 Influenza due to other identified influenza virus with other respiratory manifestations: Secondary | ICD-10-CM | POA: Diagnosis not present

## 2022-07-27 DIAGNOSIS — R509 Fever, unspecified: Secondary | ICD-10-CM | POA: Diagnosis not present

## 2022-07-27 DIAGNOSIS — R059 Cough, unspecified: Secondary | ICD-10-CM | POA: Diagnosis not present

## 2022-07-27 LAB — RESP PANEL BY RT-PCR (RSV, FLU A&B, COVID)  RVPGX2
Influenza A by PCR: POSITIVE — AB
Influenza B by PCR: NEGATIVE
Resp Syncytial Virus by PCR: NEGATIVE
SARS Coronavirus 2 by RT PCR: NEGATIVE

## 2022-07-27 NOTE — ED Provider Triage Note (Signed)
Emergency Medicine Provider Triage Evaluation Note  [name] , a 71 y.o. female  was evaluated in triage.  Pt complains of cough, fever, nasal congestion since yesterday. Took mucinex today. No tylenol. Reports that she had a URI a few weeks ago and thought she got better but then got worse again yesterday. Reports dry cough.  Review of Systems  Positive: Cough, congestion, fever Negative: Abdominal pain, n/v/d  Physical Exam  There were no vitals taken for this visit. Gen:   Awake, no distress   Resp:  Normal effort  MSK:   Moves extremities without difficulty  Other:    Medical Decision Making  Medically screening exam initiated at 8:59 AM.  Appropriate orders placed.  [name] was informed that the remainder of the evaluation will be completed by another provider, this initial triage assessment does not replace that evaluation, and the importance of remaining in the ED until their evaluation is complete.     Sara Old, PA-C 07/27/22 [phone]

## 2022-07-27 NOTE — ED Provider Notes (Signed)
[facility] Provider Note    Event Date/Time   First MD Initiated Contact with Patient 07/27/22 0935     (approximate)   History   Fever and Cough   HPI  [name] is a 71 y.o. female with a past medical history of sciatica, Crohn's disease, GERD, fibromyalgia presents today for evaluation of cough, fever, and congestion.  She reports that she has been feeling sick since 12/24 but her symptoms have been worsening over the past 2 to 3 days.  She reports that she has developed a fever but she is unsure how high because she has not taken her temperature.  She took Mucinex earlier today, no Tylenol.  She reports that her cough is dry.  No chest pain or shortness of breath.  Patient Active Problem List   Diagnosis Date Noted   Acute midline low back pain with right-sided sciatica 10/03/2020   Bilateral sciatica 10/03/2020   Crohn's disease ([place]) 06/16/2018   Chronic thoracic back pain 06/16/2018   Closed fracture of proximal end of left fibula 10/20/17  12/02/2017   Thyroid nodule 09/23/2017   Edema of left lower extremity 08/25/2017   Thunderclap headache 09/06/2015   Vision changes 09/06/2015   Dizziness 09/06/2015   New onset of headaches after age 47 09/06/2015   B12 deficiency 07/09/2015   Acute bilateral lower abdominal pain 12/08/2014   Osteoporosis 11/20/2014   Chronic back pain 11/10/2014   GERD (gastroesophageal reflux disease) 06/16/2014   Impaired fasting glucose 03/29/2014   Fibromyalgia 12/09/2013   Crohn's ileitis ([place]) 01/17/2011   Iron deficiency anemia 04/10/2010          Physical Exam   Triage Vital Signs: ED Triage Vitals  Enc Vitals Group     BP 07/27/22 0902 (!) 153/86     Pulse Rate 07/27/22 0902 91     Resp 07/27/22 0902 18     Temp 07/27/22 0902 98.8 F (37.1 C)     Temp Source 07/27/22 0902 Oral     SpO2 07/27/22 0902 96 %     Weight 07/27/22 0905 200 lb (90.7 kg)     Height 07/27/22 0905 5' 4"  (1.626 m)      Head Circumference --      Peak Flow --      Pain Score 07/27/22 0905 0     Pain Loc --      Pain Edu? --      Excl. in [place]? --     Most recent vital signs: Vitals:   07/27/22 0902  BP: (!) 153/86  Pulse: 91  Resp: 18  Temp: 98.8 F (37.1 C)  SpO2: 96%    Physical Exam Vitals and nursing note reviewed.  Constitutional:      General: Awake and alert. No acute distress.    Appearance: Normal appearance. The patient is normal weight.  HENT:     Head: Normocephalic and atraumatic.     Mouth: Mucous membranes are moist.  Eyes:     General: PERRL. Normal EOMs        Right eye: No discharge.        Left eye: No discharge.     Conjunctiva/sclera: Conjunctivae normal.  Cardiovascular:     Rate and Rhythm: Normal rate and regular rhythm.     Pulses: Normal pulses.     Heart sounds: Normal heart sounds Pulmonary:     Effort: Pulmonary effort is normal. No respiratory distress.  Breath sounds: Normal breath sounds.  Lungs are clear to auscultation bilaterally.  Dry cough on exam Abdominal:     Abdomen is soft. There is no abdominal tenderness. No rebound or guarding. No distention. Musculoskeletal:        General: No swelling. Normal range of motion.     Cervical back: Normal range of motion and neck supple.  Skin:    General: Skin is warm and dry.     Capillary Refill: Capillary refill takes less than 2 seconds.     Findings: No rash.  Neurological:     Mental Status: The patient is awake and alert.      ED Results / Procedures / Treatments   Labs (all labs ordered are listed, but only abnormal results are displayed) Labs Reviewed  RESP PANEL BY RT-PCR (RSV, FLU A&B, COVID)  RVPGX2 - Abnormal; Notable for the following components:      Result Value   Influenza A by PCR POSITIVE (*)    All other components within normal limits     EKG     RADIOLOGY I independently reviewed and interpreted imaging and agree with radiologists  findings.     PROCEDURES:  Critical Care performed:   Procedures   MEDICATIONS ORDERED IN ED: Medications - No data to display   IMPRESSION / MDM / [place] / ED COURSE  I reviewed the triage vital signs and the nursing notes.   Differential diagnosis includes, but is not limited to, bronchitis, pneumonia, COVID-19, influenza, RSV.  Patient is awake and alert, hemodynamically stable and afebrile.  She is able to speak easily in complete sentences.  Her lungs are clear to auscultation bilaterally.  She has no accessory muscle use or increased work of breathing.  X-ray was obtained and is negative for acute cardiopulmonary abnormality.  Swab is positive for influenza.  Discussed the option of Tamiflu, the patient declined given the side effects. She was advised that this is highly contagious to others.   We discussed symptomatic management and return precautions.  Patient understands and agrees with plan.  She was discharged in stable condition.  Patient's presentation is most consistent with acute complicated illness / injury requiring diagnostic workup.    FINAL CLINICAL IMPRESSION(S) / ED DIAGNOSES   Final diagnoses:  Influenza A     Rx / DC Orders   ED Discharge Orders     None        Note:  This document was prepared using Dragon voice recognition software and may include unintentional dictation errors.   Marquette Old, PA-C 07/27/22 1107    Nathaniel Man, MD 07/27/22 1534

## 2022-07-27 NOTE — Discharge Instructions (Signed)
Your swab is positive for the flu. You may take tylenol 648m every 6 hours as needed for fever or pain. Return for new, worsening, or change in symptoms or other concerns.

## 2022-07-27 NOTE — ED Triage Notes (Signed)
Pt via POV from home. Pt c/o cough, fever, and congestion. States she has felt sick since 12/24 but just recently in the past 2-3 days she has been having a fever and chills. States she has been taking Muscinex & Tylenol. Denies taking any Tylenol today. Pt is A&Ox4 and NAd

## 2022-08-08 DIAGNOSIS — J181 Lobar pneumonia, unspecified organism: Secondary | ICD-10-CM | POA: Diagnosis not present

## 2022-08-09 DIAGNOSIS — J181 Lobar pneumonia, unspecified organism: Secondary | ICD-10-CM | POA: Diagnosis not present

## 2022-08-14 ENCOUNTER — Inpatient Hospital Stay: Payer: Medicare HMO

## 2022-08-14 ENCOUNTER — Ambulatory Visit: Payer: Medicare HMO | Admitting: Family Medicine

## 2022-08-16 ENCOUNTER — Encounter (HOSPITAL_COMMUNITY): Payer: Self-pay

## 2022-08-16 ENCOUNTER — Emergency Department (HOSPITAL_COMMUNITY)
Admission: EM | Admit: 2022-08-16 | Discharge: 2022-08-16 | Disposition: A | Discharge status: Home / Self Care | Payer: Medicare HMO | Attending: Emergency Medicine | Admitting: Emergency Medicine

## 2022-08-16 ENCOUNTER — Emergency Department (HOSPITAL_COMMUNITY): Payer: Medicare HMO

## 2022-08-16 ENCOUNTER — Other Ambulatory Visit: Payer: Self-pay

## 2022-08-16 DIAGNOSIS — R197 Diarrhea, unspecified: Secondary | ICD-10-CM | POA: Diagnosis not present

## 2022-08-16 DIAGNOSIS — J9811 Atelectasis: Secondary | ICD-10-CM | POA: Diagnosis not present

## 2022-08-16 DIAGNOSIS — R531 Weakness: Secondary | ICD-10-CM | POA: Diagnosis not present

## 2022-08-16 DIAGNOSIS — R059 Cough, unspecified: Secondary | ICD-10-CM | POA: Diagnosis not present

## 2022-08-16 DIAGNOSIS — K449 Diaphragmatic hernia without obstruction or gangrene: Secondary | ICD-10-CM | POA: Diagnosis not present

## 2022-08-16 LAB — URINALYSIS, ROUTINE W REFLEX MICROSCOPIC
Bacteria, UA: NONE SEEN
Bilirubin Urine: NEGATIVE
Glucose, UA: NEGATIVE mg/dL
Ketones, ur: NEGATIVE mg/dL
Leukocytes,Ua: NEGATIVE
Nitrite: NEGATIVE
Protein, ur: NEGATIVE mg/dL
Specific Gravity, Urine: 1.018 (ref 1.005–1.030)
pH: 5 (ref 5.0–8.0)

## 2022-08-16 LAB — COMPREHENSIVE METABOLIC PANEL
ALT: 25 U/L (ref 0–44)
AST: 33 U/L (ref 15–41)
Albumin: 3.8 g/dL (ref 3.5–5.0)
Alkaline Phosphatase: 94 U/L (ref 38–126)
Anion gap: 9 (ref 5–15)
BUN: 15 mg/dL (ref 8–23)
CO2: 22 mmol/L (ref 22–32)
Calcium: 8.7 mg/dL — ABNORMAL LOW (ref 8.9–10.3)
Chloride: 103 mmol/L (ref 98–111)
Creatinine, Ser: 0.73 mg/dL (ref 0.44–1.00)
GFR, Estimated: >60 mL/min (ref >60)
Glucose, Bld: 125 mg/dL — ABNORMAL HIGH (ref 70–99)
Potassium: 4 mmol/L (ref 3.5–5.1)
Sodium: 134 mmol/L — ABNORMAL LOW (ref 135–145)
Total Bilirubin: 0.5 mg/dL (ref 0.3–1.2)
Total Protein: 7.9 g/dL (ref 6.5–8.1)

## 2022-08-16 LAB — CBC
HCT: 42.9 % (ref 36.0–46.0)
Hemoglobin: 14.3 g/dL (ref 12.0–15.0)
MCH: 28.8 pg (ref 26.0–34.0)
MCHC: 33.3 g/dL (ref 30.0–36.0)
MCV: 86.5 fL (ref 80.0–100.0)
Platelets: 240 10*3/uL (ref 150–400)
RBC: 4.96 MIL/uL (ref 3.87–5.11)
RDW: 13.5 % (ref 11.5–15.5)
WBC: 5.7 10*3/uL (ref 4.0–10.5)
nRBC: 0 % (ref 0.0–0.2)

## 2022-08-16 LAB — LIPASE, BLOOD: Lipase: 38 U/L (ref 11–51)

## 2022-08-16 MED ORDER — LOPERAMIDE HCL 2 MG PO CAPS
2.0000 mg | ORAL_CAPSULE | Freq: Once | ORAL | Status: AC
  Administered 2022-08-16: 2 mg via ORAL
  Filled 2022-08-16: qty 1

## 2022-08-16 MED ORDER — SODIUM CHLORIDE 0.9 % IV BOLUS
1000.0000 mL | Freq: Once | INTRAVENOUS | Status: AC
  Administered 2022-08-16: 1000 mL via INTRAVENOUS

## 2022-08-16 NOTE — ED Notes (Signed)
Pt has been asked for a urine and a stool sample. Pt was given a hat and call bell to call for help when she is able to go. At this time pt has been usable to produce a urine or a stool sample. Provider notified.

## 2022-08-16 NOTE — Discharge Instructions (Signed)
Discontinue taking the last dose of your antibiotics given your diarrhea.  If you are continuing to have issues with diarrhea please call your primary care doctor as they may want to take a sample to test for C. difficile colitis.  Contact a health care provider if: You have a fever. Your diarrhea gets worse. You have new symptoms. You vomit every time you eat or drink. You feel light-headed, dizzy, or have a headache. You have muscle cramps. You have signs of dehydration, such as: Dark urine, very little urine, or no urine. Cracked lips. Dry mouth. Sunken eyes. Sleepiness. Weakness. You have bloody or black stools or stools that look like tar. You have severe pain, cramping, or bloating in your abdomen. Your skin feels cold and clammy. You feel confused. Get help right away if: You have chest pain or your heart is beating very quickly. You have trouble breathing or you are breathing very quickly. You feel extremely weak or you faint. These symptoms may be an emergency. Get help right away. Call 911. Do not wait to see if the symptoms will go away. Do not drive yourself to the hospital.

## 2022-08-16 NOTE — ED Provider Notes (Signed)
[facility] Provider Note   CSN: [id number] Arrival date & time: 08/16/22  1320     History  Chief Complaint  Patient presents with   Diarrhea    [name] is a 72 y.o. female who presents for generalized weakness and disrrhea. Patient is S/p Influenza infection 2 weeks ago. She was seen at an urgent care last week and dx with pneumonia. She wasgiven "2 shots" and started on levaquin and augmentin. She began having diarrhea yesterday. SHe deneis a hx of c-diff. She denies cp, cough, sob.   Diarrhea      Home Medications Prior to Admission medications   Medication Sig Start Date End Date Taking? Authorizing Provider  acetaminophen (TYLENOL) 325 MG tablet Take 650 mg by mouth as needed for moderate pain or headache.   Yes [provider]  amoxicillin-clavulanate (AUGMENTIN) 875-125 MG tablet Take 1 tablet by mouth 2 (two) times daily. 10 days supply start date 08/08/22 08/08/22  Yes [provider]  levofloxacin (LEVAQUIN) 750 MG tablet Take 750 mg by mouth daily. 10 days supply start date 08/08/22 08/08/22  Yes [provider]  Vitamin D, Ergocalciferol, (DRISDOL) 1.25 MG (50000 UNIT) CAPS capsule TAKE 1 CAPSULE BY MOUTH ONE TIME PER WEEK 07/17/22  Yes Pennington, Rebekah M, PA-C  pantoprazole (PROTONIX) 40 MG tablet Take 1 tablet (40 mg total) by mouth daily. Patient not taking: Reported on 08/16/2022 05/08/22   Kathyrn Drown, MD      Allergies    Cefzil [cefprozil], Celebrex [celecoxib], and Sulfonamide derivatives    Review of Systems   Review of Systems  Gastrointestinal:  Positive for diarrhea.    Physical Exam Updated Vital Signs BP 134/66   Pulse 79   Temp 98.1 F (36.7 C) (Oral)   Resp 15   Ht '5\' 4"'$  (1.626 m)   Wt 93 kg   SpO2 94%   BMI 35.19 kg/m  Physical Exam Vitals and nursing note reviewed.  Constitutional:      General: She is not in acute distress.    Appearance: She is  well-developed. She is not diaphoretic.  HENT:     Head: Normocephalic and atraumatic.     Right Ear: External ear normal.     Left Ear: External ear normal.     Nose: Nose normal.     Mouth/Throat:     Mouth: Mucous membranes are moist.  Eyes:     General: No scleral icterus.    Conjunctiva/sclera: Conjunctivae normal.  Cardiovascular:     Rate and Rhythm: Normal rate and regular rhythm.     Heart sounds: Normal heart sounds. No murmur heard.    No friction rub. No gallop.  Pulmonary:     Effort: Pulmonary effort is normal. No respiratory distress.     Breath sounds: Normal breath sounds.  Abdominal:     General: Bowel sounds are normal. There is no distension.     Palpations: Abdomen is soft. There is no mass.     Tenderness: There is no abdominal tenderness. There is no guarding.  Musculoskeletal:     Cervical back: Normal range of motion.  Skin:    General: Skin is warm and dry.  Neurological:     Mental Status: She is alert and oriented to person, place, and time.  Psychiatric:        Behavior: Behavior normal.     ED Results / Procedures / Treatments   Labs (all  labs ordered are listed, but only abnormal results are displayed) Labs Reviewed  COMPREHENSIVE METABOLIC PANEL - Abnormal; Notable for the following components:      Result Value   Sodium 134 (*)    Glucose, Bld 125 (*)    Calcium 8.7 (*)    All other components within normal limits  URINALYSIS, ROUTINE W REFLEX MICROSCOPIC - Abnormal; Notable for the following components:   Hgb urine dipstick MODERATE (*)    All other components within normal limits  LIPASE, BLOOD  CBC    EKG None  Radiology DG Chest 2 View  Result Date: 08/16/2022 CLINICAL DATA:  Cough. Diagnosed with flu around new years without improvement. Prescribed antibiotics a week ago. Now diarrhea. EXAM: CHEST - 2 VIEW COMPARISON:  07/27/2022 FINDINGS: Normal heart size and pulmonary vascularity. Mild linear atelectasis in the lung  bases. No focal consolidation or edema. No pleural effusions. No pneumothorax. Large esophageal hiatal hernia behind the heart. Degenerative changes in the spine. IMPRESSION: Atelectasis in the lung bases. No focal consolidation. Large esophageal hiatal hernia. Electronically Signed   By: Lucienne Capers M.D.   On: 08/16/2022 15:58    Procedures Procedures    Medications Ordered in ED Medications  loperamide (IMODIUM) capsule 2 mg (2 mg Oral Given 08/16/22 1541)  sodium chloride 0.9 % bolus 1,000 mL (0 mLs Intravenous Stopped 08/16/22 1958)    ED Course/ Medical Decision Making/ A&P                             Medical Decision Making Patient here with complaint of diarrhea starting 2 days ago after almost completing course of both Levaquin and Augmentin for presumed community-acquired pneumonia diagnosed about a week ago. The differential diagnosis of diarrhea includes but is not limited to Viral- norovirus/rotavirus; Bacterial-Campylobacter,Shigella, Salmonella, Escherichia coli, E. coli 0157:H7, Yersinia enterocolitica, Vibrio cholerae, Clostridium difficile. Parasitic- Giardia lamblia, Cryptosporidium,Entamoeba histolytica,Cyclospora, Microsporidium. Toxin- Staphylococcus aureus, Bacillus cereus. Noninfectious causes include GI Bleed, Appendicitis, Mesenteric Ischemia, Diverticulitis, Adrenal Crisis, Thyroid Storm, Toxicologic exposures, Antibiotic or drug-associated, inflammatory bowel disease.   Patient given Imodium upfront.  On exam she has a little bit of tenderness in her abdomen.  I ordered a C. difficile screening however patient has not had any repeat episodes of diarrhea.  I ordered labs including urinalysis CMP and CBC all of which showed no acute findings.  I visualized and interpreted a two-view chest x-ray which shows no evidence of infection.  I suspect the patient most likely has antibiotic induced diarrhea.  I have advised the patient to follow closely with PCP if she  continues to have diarrhea she may be able to provide a stool sample.  She was given some fluids here in the emergency department with some improvement in her heart rate.  She is otherwise hemodynamically stable and appears appropriate for discharge  Amount and/or Complexity of Data Reviewed Labs: ordered. Radiology: ordered.  Risk Prescription drug management.           Final Clinical Impression(s) / ED Diagnoses Final diagnoses:  Diarrhea, unspecified type    Rx / DC Orders ED Discharge Orders     None         Margarita Mail, PA-C 08/17/22 1958    Hayden Rasmussen, MD 08/22/22 [phone]

## 2022-08-16 NOTE — ED Triage Notes (Signed)
Pt reports she was diagnosed with flu around new years and wasn't feeling much improvement so she went to urgent care  a week ago and was prescribed 2 abx for pna.  Pt reports she began having diarrhea last night.

## 2022-08-16 NOTE — ED Notes (Signed)
Pt unable to give urine sample at this time due to lack of intake. Notified provider

## 2022-08-20 ENCOUNTER — Telehealth: Payer: Self-pay

## 2022-08-20 NOTE — Telephone Encounter (Signed)
        Patient  visited [facility] on 08/16/2022  for Diarrhea, unspecified type.   Telephone encounter attempt :  1st  A HIPAA compliant voice message was left requesting a return call.  Instructed patient to call back at [phone].   [facility] Resource Care Guide   [email or web address]  ?? [phone]   Website: [email or web address]  [email or web address]

## 2022-08-21 ENCOUNTER — Inpatient Hospital Stay: Payer: Medicare HMO | Attending: Hematology

## 2022-08-21 VITALS — BP 116/66 | HR 66 | Temp 98.0°F | Resp 20

## 2022-08-21 DIAGNOSIS — E538 Deficiency of other specified B group vitamins: Secondary | ICD-10-CM | POA: Insufficient documentation

## 2022-08-21 DIAGNOSIS — D509 Iron deficiency anemia, unspecified: Secondary | ICD-10-CM | POA: Diagnosis not present

## 2022-08-21 DIAGNOSIS — D500 Iron deficiency anemia secondary to blood loss (chronic): Secondary | ICD-10-CM

## 2022-08-21 MED ORDER — SODIUM CHLORIDE 0.9 % IV SOLN
300.0000 mg | Freq: Once | INTRAVENOUS | Status: AC
  Administered 2022-08-21: 300 mg via INTRAVENOUS
  Filled 2022-08-21: qty 300

## 2022-08-21 MED ORDER — CYANOCOBALAMIN 1000 MCG/ML IJ SOLN
1000.0000 ug | Freq: Once | INTRAMUSCULAR | Status: AC
  Administered 2022-08-21: 1000 ug via INTRAMUSCULAR
  Filled 2022-08-21: qty 1

## 2022-08-21 MED ORDER — SODIUM CHLORIDE 0.9 % IV SOLN: Freq: Once | INTRAVENOUS | Status: AC

## 2022-08-21 NOTE — Patient Instructions (Signed)
[facility]  Discharge Instructions: Thank you for choosing [facility] to provide your oncology and hematology care.  If you have a lab appointment with the Cancer Center, please come in thru the Main Entrance and check in at the main information desk.  Wear comfortable clothing and clothing appropriate for easy access to any Portacath or PICC line.   We strive to give you quality time with your provider. You may need to reschedule your appointment if you arrive late (15 or more minutes).  Arriving late affects you and other patients whose appointments are after yours.  Also, if you miss three or more appointments without notifying the office, you may be dismissed from the clinic at the provider's discretion.      For prescription refill requests, have your pharmacy contact our office and allow 72 hours for refills to be completed.    Today you received the following chemotherapy and/or immunotherapy agents Venofer      To help prevent nausea and vomiting after your treatment, we encourage you to take your nausea medication as directed.  BELOW ARE SYMPTOMS THAT SHOULD BE REPORTED IMMEDIATELY: *FEVER GREATER THAN 100.4 F (38 C) OR HIGHER *CHILLS OR SWEATING *NAUSEA AND VOMITING THAT IS NOT CONTROLLED WITH YOUR NAUSEA MEDICATION *UNUSUAL SHORTNESS OF BREATH *UNUSUAL BRUISING OR BLEEDING *URINARY PROBLEMS (pain or burning when urinating, or frequent urination) *BOWEL PROBLEMS (unusual diarrhea, constipation, pain near the anus) TENDERNESS IN MOUTH AND THROAT WITH OR WITHOUT PRESENCE OF ULCERS (sore throat, sores in mouth, or a toothache) UNUSUAL RASH, SWELLING OR PAIN  UNUSUAL VAGINAL DISCHARGE OR ITCHING   Items with * indicate a potential emergency and should be followed up as soon as possible or go to the Emergency Department if any problems should occur.  Please show the CHEMOTHERAPY ALERT CARD or IMMUNOTHERAPY ALERT CARD at check-in to the Emergency  Department and triage nurse.  Should you have questions after your visit or need to cancel or reschedule your appointment, please contact [facility] [phone]  and follow the prompts.  Office hours are 8:00 a.m. to 4:30 p.m. Monday - Friday. Please note that voicemails left after 4:00 p.m. may not be returned until the following business day.  We are closed weekends and major holidays. You have access to a nurse at all times for urgent questions. Please call the main number to the clinic [phone] and follow the prompts.  For any non-urgent questions, you may also contact your provider using MyChart. We now offer e-Visits for anyone 18 and older to request care online for non-urgent symptoms. For details visit [email or web address].   Also download the MyChart app! Go to the app store, search "MyChart", open the app, select [facility], and log in with your MyChart username and password.   

## 2022-08-21 NOTE — Progress Notes (Signed)
Patient presents today for Venofer and B12 per providers order.  Vital signs within parameters for treatment.  Patient has no new complaints at this time.  Peripheral IV started and blood return noted pre and post infusion.  Stable during B12 administration without incident; injection site WNL; see MAR for injection details.  Patient tolerated procedure well and without incident.  No questions or complaints noted at this time.    Stable during infusion without adverse affects.  Vital signs stable.  No complaints at this time.  Discharge from clinic ambulatory in stable condition.  Alert and oriented X 3.  Follow up with [facility] as scheduled.

## 2022-08-22 ENCOUNTER — Telehealth: Payer: Self-pay

## 2022-08-22 NOTE — Telephone Encounter (Signed)
        Patient  visited [facility] on 08/16/2022  for Diarrhea, unspecified type.   Telephone encounter attempt : 2nd   A HIPAA compliant voice message was left requesting a return call.  Instructed patient to call back at [phone].   [facility] Resource Care Guide   ??[email or web address]  ?? [phone]   Website: [email or web address]  [email or web address]

## 2022-08-26 ENCOUNTER — Telehealth: Payer: Self-pay

## 2022-08-26 NOTE — Telephone Encounter (Signed)
        Patient  visited [facility] on 08/16/2022  for Diarrhea, unspecified type.   Telephone encounter attempt :  3rd  A HIPAA compliant voice message was left requesting a return call.  Instructed patient to call back at [phone].   [facility] Resource Care Guide   ??[email or web address]  ?? [phone]   Website: [email or web address]  [email or web address]

## 2022-08-28 ENCOUNTER — Inpatient Hospital Stay: Payer: Medicare HMO

## 2022-08-28 VITALS — BP 126/65 | HR 58 | Temp 97.2°F | Resp 20

## 2022-08-28 DIAGNOSIS — E538 Deficiency of other specified B group vitamins: Secondary | ICD-10-CM | POA: Diagnosis not present

## 2022-08-28 DIAGNOSIS — D500 Iron deficiency anemia secondary to blood loss (chronic): Secondary | ICD-10-CM

## 2022-08-28 DIAGNOSIS — D509 Iron deficiency anemia, unspecified: Secondary | ICD-10-CM | POA: Diagnosis not present

## 2022-08-28 MED ORDER — ACETAMINOPHEN 325 MG PO TABS: 650.0000 mg | ORAL_TABLET | Freq: Once | ORAL | Status: DC

## 2022-08-28 MED ORDER — LORATADINE 10 MG PO TABS: 10.0000 mg | ORAL_TABLET | Freq: Once | ORAL | Status: DC

## 2022-08-28 MED ORDER — SODIUM CHLORIDE 0.9 % IV SOLN: Freq: Once | INTRAVENOUS | Status: AC

## 2022-08-28 MED ORDER — SODIUM CHLORIDE 0.9 % IV SOLN
300.0000 mg | Freq: Once | INTRAVENOUS | Status: AC
  Administered 2022-08-28: 300 mg via INTRAVENOUS
  Filled 2022-08-28: qty 300

## 2022-08-28 NOTE — Patient Instructions (Signed)
[facility]  Discharge Instructions: Thank you for choosing [facility] to provide your oncology and hematology care.  If you have a lab appointment with the Cancer Center, please come in thru the Main Entrance and check in at the main information desk.  Wear comfortable clothing and clothing appropriate for easy access to any Portacath or PICC line.   We strive to give you quality time with your provider. You may need to reschedule your appointment if you arrive late (15 or more minutes).  Arriving late affects you and other patients whose appointments are after yours.  Also, if you miss three or more appointments without notifying the office, you may be dismissed from the clinic at the provider's discretion.      For prescription refill requests, have your pharmacy contact our office and allow 72 hours for refills to be completed.    Today you received the following chemotherapy and/or immunotherapy agents Venofer      To help prevent nausea and vomiting after your treatment, we encourage you to take your nausea medication as directed.  BELOW ARE SYMPTOMS THAT SHOULD BE REPORTED IMMEDIATELY: *FEVER GREATER THAN 100.4 F (38 C) OR HIGHER *CHILLS OR SWEATING *NAUSEA AND VOMITING THAT IS NOT CONTROLLED WITH YOUR NAUSEA MEDICATION *UNUSUAL SHORTNESS OF BREATH *UNUSUAL BRUISING OR BLEEDING *URINARY PROBLEMS (pain or burning when urinating, or frequent urination) *BOWEL PROBLEMS (unusual diarrhea, constipation, pain near the anus) TENDERNESS IN MOUTH AND THROAT WITH OR WITHOUT PRESENCE OF ULCERS (sore throat, sores in mouth, or a toothache) UNUSUAL RASH, SWELLING OR PAIN  UNUSUAL VAGINAL DISCHARGE OR ITCHING   Items with * indicate a potential emergency and should be followed up as soon as possible or go to the Emergency Department if any problems should occur.  Please show the CHEMOTHERAPY ALERT CARD or IMMUNOTHERAPY ALERT CARD at check-in to the Emergency  Department and triage nurse.  Should you have questions after your visit or need to cancel or reschedule your appointment, please contact [facility] [phone]  and follow the prompts.  Office hours are 8:00 a.m. to 4:30 p.m. Monday - Friday. Please note that voicemails left after 4:00 p.m. may not be returned until the following business day.  We are closed weekends and major holidays. You have access to a nurse at all times for urgent questions. Please call the main number to the clinic [phone] and follow the prompts.  For any non-urgent questions, you may also contact your provider using MyChart. We now offer e-Visits for anyone 18 and older to request care online for non-urgent symptoms. For details visit [email or web address].   Also download the MyChart app! Go to the app store, search "MyChart", open the app, select [facility], and log in with your MyChart username and password.   

## 2022-08-28 NOTE — Progress Notes (Signed)
Patient presents today for Venofer infusion per rpoviders order.  Vital signs WNL.  Patient took premedications at home prior to appointment.    Stable during infusion without adverse affects.  Vital signs stable.  No complaints at this time.  Discharge from clinic ambulatory in stable condition.  Alert and oriented X 3.  Follow up with [facility] as scheduled.

## 2022-09-04 ENCOUNTER — Inpatient Hospital Stay: Payer: Medicare HMO | Attending: Hematology

## 2022-09-04 VITALS — BP 112/63 | HR 68 | Temp 98.0°F | Resp 18

## 2022-09-04 DIAGNOSIS — D500 Iron deficiency anemia secondary to blood loss (chronic): Secondary | ICD-10-CM

## 2022-09-04 DIAGNOSIS — D509 Iron deficiency anemia, unspecified: Secondary | ICD-10-CM | POA: Diagnosis not present

## 2022-09-04 DIAGNOSIS — Z79899 Other long term (current) drug therapy: Secondary | ICD-10-CM | POA: Diagnosis not present

## 2022-09-04 DIAGNOSIS — E538 Deficiency of other specified B group vitamins: Secondary | ICD-10-CM | POA: Insufficient documentation

## 2022-09-04 MED ORDER — SODIUM CHLORIDE 0.9 % IV SOLN
300.0000 mg | Freq: Once | INTRAVENOUS | Status: AC
  Administered 2022-09-04: 300 mg via INTRAVENOUS
  Filled 2022-09-04: qty 300

## 2022-09-04 MED ORDER — SODIUM CHLORIDE 0.9 % IV SOLN: Freq: Once | INTRAVENOUS | Status: AC

## 2022-09-04 NOTE — Progress Notes (Signed)
Patient took her tylenol and benadryl at home today.   Venofer infusion given per orders. Patient tolerated it well without problems. Vitals stable and discharged home from clinic ambulatory. Follow up as scheduled.

## 2022-09-04 NOTE — Patient Instructions (Signed)
[name]  Discharge Instructions: Thank you for choosing [facility] to provide your oncology and hematology care.  If you have a lab appointment with the [facility], please come in thru the Main Entrance and check in at the main information desk.  Wear comfortable clothing and clothing appropriate for easy access to any Portacath or PICC line.   We strive to give you quality time with your provider. You may need to reschedule your appointment if you arrive late (15 or more minutes).  Arriving late affects you and other patients whose appointments are after yours.  Also, if you miss three or more appointments without notifying the office, you may be dismissed from the clinic at the provider's discretion.      For prescription refill requests, have your pharmacy contact our office and allow 72 hours for refills to be completed.    Today you received the following, venofer infusion   To help prevent nausea and vomiting after your treatment, we encourage you to take your nausea medication as directed.  BELOW ARE SYMPTOMS THAT SHOULD BE REPORTED IMMEDIATELY: *FEVER GREATER THAN 100.4 F (38 C) OR HIGHER *CHILLS OR SWEATING *NAUSEA AND VOMITING THAT IS NOT CONTROLLED WITH YOUR NAUSEA MEDICATION *UNUSUAL SHORTNESS OF BREATH *UNUSUAL BRUISING OR BLEEDING *URINARY PROBLEMS (pain or burning when urinating, or frequent urination) *BOWEL PROBLEMS (unusual diarrhea, constipation, pain near the anus) TENDERNESS IN MOUTH AND THROAT WITH OR WITHOUT PRESENCE OF ULCERS (sore throat, sores in mouth, or a toothache) UNUSUAL RASH, SWELLING OR PAIN  UNUSUAL VAGINAL DISCHARGE OR ITCHING   Items with * indicate a potential emergency and should be followed up as soon as possible or go to the Emergency Department if any problems should occur.  Please show the CHEMOTHERAPY ALERT CARD or IMMUNOTHERAPY ALERT CARD at check-in to the Emergency Department and triage  nurse.  Should you have questions after your visit or need to cancel or reschedule your appointment, please contact [facility] [phone]  and follow the prompts.  Office hours are 8:00 a.m. to 4:30 p.m. Monday - Friday. Please note that voicemails left after 4:00 p.m. may not be returned until the following business day.  We are closed weekends and major holidays. You have access to a nurse at all times for urgent questions. Please call the main number to the clinic [phone] and follow the prompts.  For any non-urgent questions, you may also contact your provider using MyChart. We now offer e-Visits for anyone 4 and older to request care online for non-urgent symptoms. For details visit [email or web address].   Also download the MyChart app! Go to the app store, search "MyChart", open the app, select [facility], and log in with your MyChart username and password.

## 2022-09-10 DIAGNOSIS — H5203 Hypermetropia, bilateral: Secondary | ICD-10-CM | POA: Diagnosis not present

## 2022-09-11 ENCOUNTER — Inpatient Hospital Stay: Payer: Medicare HMO

## 2022-09-11 VITALS — BP 157/66 | HR 72 | Temp 97.7°F | Resp 20

## 2022-09-11 DIAGNOSIS — Z79899 Other long term (current) drug therapy: Secondary | ICD-10-CM | POA: Diagnosis not present

## 2022-09-11 DIAGNOSIS — E538 Deficiency of other specified B group vitamins: Secondary | ICD-10-CM | POA: Diagnosis not present

## 2022-09-11 DIAGNOSIS — D509 Iron deficiency anemia, unspecified: Secondary | ICD-10-CM | POA: Diagnosis not present

## 2022-09-11 DIAGNOSIS — D500 Iron deficiency anemia secondary to blood loss (chronic): Secondary | ICD-10-CM

## 2022-09-11 MED ORDER — CYANOCOBALAMIN 1000 MCG/ML IJ SOLN
1000.0000 ug | Freq: Once | INTRAMUSCULAR | Status: AC
  Administered 2022-09-11: 1000 ug via INTRAMUSCULAR
  Filled 2022-09-11: qty 1

## 2022-09-11 NOTE — Progress Notes (Signed)
[name] presents today for injection per the provider's orders.  B12 administration without incident; injection site WNL; see MAR for injection details.  Patient tolerated procedure well and without incident.  No questions or complaints noted at this time. Discharged from clinic ambulatory in stable condition. Alert and oriented x 3. F/U with [facility] as scheduled.

## 2022-09-11 NOTE — Patient Instructions (Signed)
[facility]  Discharge Instructions: Thank you for choosing [facility] to provide your oncology and hematology care.  If you have a lab appointment with the [facility], please come in thru the Main Entrance and check in at the main information desk.  Wear comfortable clothing and clothing appropriate for easy access to any Portacath or PICC line.   We strive to give you quality time with your provider. You may need to reschedule your appointment if you arrive late (15 or more minutes).  Arriving late affects you and other patients whose appointments are after yours.  Also, if you miss three or more appointments without notifying the office, you may be dismissed from the clinic at the provider's discretion.      For prescription refill requests, have your pharmacy contact our office and allow 72 hours for refills to be completed.    Today you received the following chemotherapy and/or immunotherapy agents B12 injection. Vitamin B12 Injection What is this medication? Vitamin B12 (VAHY tuh min B12) prevents and treats low vitamin B12 levels in your body. It is used in people who do not get enough vitamin B12 from their diet or when their digestive tract does not absorb enough. Vitamin B12 plays an important role in maintaining the health of your nervous system and red blood cells. This medicine may be used for other purposes; ask your health care provider or pharmacist if you have questions. COMMON BRAND NAME(S): B-12 Compliance Kit, B-12 Injection Kit, Cyomin, Dodex, LA-12, Nutri-Twelve, Physicians EZ Use B-12, Primabalt What should I tell my care team before I take this medication? They need to know if you have any of these conditions: Kidney disease Leber's disease Megaloblastic anemia An unusual or allergic reaction to cyanocobalamin, cobalt, other medications, foods, dyes, or preservatives Pregnant or trying to get pregnant Breast-feeding How should I  use this medication? This medication is injected into a muscle or deeply under the skin. It is usually given in a clinic or care team's office. However, your care team may teach you how to inject yourself. Follow all instructions. Talk to your care team about the use of this medication in children. Special care may be needed. Overdosage: If you think you have taken too much of this medicine contact a poison control center or emergency room at once. NOTE: This medicine is only for you. Do not share this medicine with others. What if I miss a dose? If you are given your dose at a clinic or care team's office, call to reschedule your appointment. If you give your own injections, and you miss a dose, take it as soon as you can. If it is almost time for your next dose, take only that dose. Do not take double or extra doses. What may interact with this medication? Alcohol Colchicine This list may not describe all possible interactions. Give your health care provider a list of all the medicines, herbs, non-prescription drugs, or dietary supplements you use. Also tell them if you smoke, drink alcohol, or use illegal drugs. Some items may interact with your medicine. What should I watch for while using this medication? Visit your care team regularly. You may need blood work done while you are taking this medication. You may need to follow a special diet. Talk to your care team. Limit your alcohol intake and avoid smoking to get the best benefit. What side effects may I notice from receiving this medication? Side effects that you should report  to your care team as soon as possible: Allergic reactions--skin rash, itching, hives, swelling of the face, lips, tongue, or throat Swelling of the ankles, hands, or feet Trouble breathing Side effects that usually do not require medical attention (report to your care team if they continue or are bothersome): Diarrhea This list may not describe all possible side  effects. Call your doctor for medical advice about side effects. You may report side effects to FDA at 1-800-FDA-1088. Where should I keep my medication? Keep out of the reach of children. Store at room temperature between 15 and 30 degrees C (59 and 85 degrees F). Protect from light. Throw away any unused medication after the expiration date. NOTE: This sheet is a summary. It may not cover all possible information. If you have questions about this medicine, talk to your doctor, pharmacist, or health care provider.  2023 Elsevier/Gold Standard (2007-09-05 00:00:00)       To help prevent nausea and vomiting after your treatment, we encourage you to take your nausea medication as directed.  BELOW ARE SYMPTOMS THAT SHOULD BE REPORTED IMMEDIATELY: *FEVER GREATER THAN 100.4 F (38 C) OR HIGHER *CHILLS OR SWEATING *NAUSEA AND VOMITING THAT IS NOT CONTROLLED WITH YOUR NAUSEA MEDICATION *UNUSUAL SHORTNESS OF BREATH *UNUSUAL BRUISING OR BLEEDING *URINARY PROBLEMS (pain or burning when urinating, or frequent urination) *BOWEL PROBLEMS (unusual diarrhea, constipation, pain near the anus) TENDERNESS IN MOUTH AND THROAT WITH OR WITHOUT PRESENCE OF ULCERS (sore throat, sores in mouth, or a toothache) UNUSUAL RASH, SWELLING OR PAIN  UNUSUAL VAGINAL DISCHARGE OR ITCHING   Items with * indicate a potential emergency and should be followed up as soon as possible or go to the Emergency Department if any problems should occur.  Please show the CHEMOTHERAPY ALERT CARD or IMMUNOTHERAPY ALERT CARD at check-in to the Emergency Department and triage nurse.  Should you have questions after your visit or need to cancel or reschedule your appointment, please contact [facility] [phone]  and follow the prompts.  Office hours are 8:00 a.m. to 4:30 p.m. Monday - Friday. Please note that voicemails left after 4:00 p.m. may not be returned until the following business day.  We are closed  weekends and major holidays. You have access to a nurse at all times for urgent questions. Please call the main number to the clinic [phone] and follow the prompts.  For any non-urgent questions, you may also contact your provider using MyChart. We now offer e-Visits for anyone 52 and older to request care online for non-urgent symptoms. For details visit [email or web address].   Also download the MyChart app! Go to the app store, search "MyChart", open the app, select [facility], and log in with your MyChart username and password.

## 2022-09-25 ENCOUNTER — Inpatient Hospital Stay: Payer: Medicare HMO

## 2022-10-01 ENCOUNTER — Telehealth: Payer: Self-pay | Admitting: Family Medicine

## 2022-10-01 NOTE — Telephone Encounter (Signed)
I have called patient to see if we can get her scheduled for an AWV-this needs to be with a provider. She is on a list that said she needed this appt with a provider.

## 2022-10-02 ENCOUNTER — Inpatient Hospital Stay: Payer: Medicare HMO | Attending: Hematology

## 2022-10-02 VITALS — BP 129/76 | HR 85 | Temp 97.6°F | Resp 18

## 2022-10-02 DIAGNOSIS — E538 Deficiency of other specified B group vitamins: Secondary | ICD-10-CM | POA: Diagnosis not present

## 2022-10-02 DIAGNOSIS — D500 Iron deficiency anemia secondary to blood loss (chronic): Secondary | ICD-10-CM

## 2022-10-02 DIAGNOSIS — Z79899 Other long term (current) drug therapy: Secondary | ICD-10-CM | POA: Insufficient documentation

## 2022-10-02 MED ORDER — CYANOCOBALAMIN 1000 MCG/ML IJ SOLN
1000.0000 ug | Freq: Once | INTRAMUSCULAR | Status: AC
  Administered 2022-10-02: 1000 ug via INTRAMUSCULAR
  Filled 2022-10-02: qty 1

## 2022-10-02 NOTE — Progress Notes (Signed)
[name] presents today for B12 injection per the provider's orders.  Stable during administration without incident; injection site WNL; see MAR for injection details.  Patient tolerated procedure well and without incident.  No questions or complaints noted at this time.

## 2022-10-02 NOTE — Patient Instructions (Signed)
[name]  Discharge Instructions: Thank you for choosing [facility] to provide your oncology and hematology care.  If you have a lab appointment with the [facility], please come in thru the Main Entrance and check in at the main information desk.  Wear comfortable clothing and clothing appropriate for easy access to any Portacath or PICC line.   We strive to give you quality time with your provider. You may need to reschedule your appointment if you arrive late (15 or more minutes).  Arriving late affects you and other patients whose appointments are after yours.  Also, if you miss three or more appointments without notifying the office, you may be dismissed from the clinic at the provider's discretion.      For prescription refill requests, have your pharmacy contact our office and allow 72 hours for refills to be completed.    Today you received the following chemotherapy and/or immunotherapy agents B12      To help prevent nausea and vomiting after your treatment, we encourage you to take your nausea medication as directed.  BELOW ARE SYMPTOMS THAT SHOULD BE REPORTED IMMEDIATELY: *FEVER GREATER THAN 100.4 F (38 C) OR HIGHER *CHILLS OR SWEATING *NAUSEA AND VOMITING THAT IS NOT CONTROLLED WITH YOUR NAUSEA MEDICATION *UNUSUAL SHORTNESS OF BREATH *UNUSUAL BRUISING OR BLEEDING *URINARY PROBLEMS (pain or burning when urinating, or frequent urination) *BOWEL PROBLEMS (unusual diarrhea, constipation, pain near the anus) TENDERNESS IN MOUTH AND THROAT WITH OR WITHOUT PRESENCE OF ULCERS (sore throat, sores in mouth, or a toothache) UNUSUAL RASH, SWELLING OR PAIN  UNUSUAL VAGINAL DISCHARGE OR ITCHING   Items with * indicate a potential emergency and should be followed up as soon as possible or go to the Emergency Department if any problems should occur.  Please show the CHEMOTHERAPY ALERT CARD or IMMUNOTHERAPY ALERT CARD at check-in to the Emergency  Department and triage nurse.  Should you have questions after your visit or need to cancel or reschedule your appointment, please contact [facility] [phone]  and follow the prompts.  Office hours are 8:00 a.m. to 4:30 p.m. Monday - Friday. Please note that voicemails left after 4:00 p.m. may not be returned until the following business day.  We are closed weekends and major holidays. You have access to a nurse at all times for urgent questions. Please call the main number to the clinic [phone] and follow the prompts.  For any non-urgent questions, you may also contact your provider using MyChart. We now offer e-Visits for anyone 42 and older to request care online for non-urgent symptoms. For details visit [email or web address].   Also download the MyChart app! Go to the app store, search "MyChart", open the app, select [facility], and log in with your MyChart username and password.

## 2022-10-16 ENCOUNTER — Inpatient Hospital Stay: Payer: Medicare HMO

## 2022-10-23 ENCOUNTER — Inpatient Hospital Stay: Payer: Medicare HMO

## 2022-11-06 ENCOUNTER — Inpatient Hospital Stay: Payer: Medicare HMO

## 2022-11-06 ENCOUNTER — Inpatient Hospital Stay: Payer: Medicare HMO | Attending: Hematology

## 2022-11-06 DIAGNOSIS — D509 Iron deficiency anemia, unspecified: Secondary | ICD-10-CM | POA: Insufficient documentation

## 2022-11-06 DIAGNOSIS — E559 Vitamin D deficiency, unspecified: Secondary | ICD-10-CM | POA: Insufficient documentation

## 2022-11-06 DIAGNOSIS — E538 Deficiency of other specified B group vitamins: Secondary | ICD-10-CM | POA: Insufficient documentation

## 2022-11-06 DIAGNOSIS — D500 Iron deficiency anemia secondary to blood loss (chronic): Secondary | ICD-10-CM

## 2022-11-06 LAB — IRON AND TIBC
Iron: 71 ug/dL (ref 28–170)
Saturation Ratios: 26 % (ref 10.4–31.8)
TIBC: 277 ug/dL (ref 250–450)
UIBC: 206 ug/dL

## 2022-11-06 LAB — VITAMIN B12: Vitamin B-12: 209 pg/mL (ref 180–914)

## 2022-11-06 LAB — CBC WITH DIFFERENTIAL/PLATELET
Abs Immature Granulocytes: 0.01 10*3/uL (ref 0.00–0.07)
Basophils Absolute: 0 10*3/uL (ref 0.0–0.1)
Basophils Relative: 1 %
Eosinophils Absolute: 0.1 10*3/uL (ref 0.0–0.5)
Eosinophils Relative: 1 %
HCT: 40.6 % (ref 36.0–46.0)
Hemoglobin: 13.5 g/dL (ref 12.0–15.0)
Immature Granulocytes: 0 %
Lymphocytes Relative: 27 %
Lymphs Abs: 1.4 10*3/uL (ref 0.7–4.0)
MCH: 29.5 pg (ref 26.0–34.0)
MCHC: 33.3 g/dL (ref 30.0–36.0)
MCV: 88.6 fL (ref 80.0–100.0)
Monocytes Absolute: 0.4 10*3/uL (ref 0.1–1.0)
Monocytes Relative: 9 %
Neutro Abs: 3.3 10*3/uL (ref 1.7–7.7)
Neutrophils Relative %: 62 %
Platelets: 204 10*3/uL (ref 150–400)
RBC: 4.58 MIL/uL (ref 3.87–5.11)
RDW: 13.3 % (ref 11.5–15.5)
WBC: 5.2 10*3/uL (ref 4.0–10.5)
nRBC: 0 % (ref 0.0–0.2)

## 2022-11-06 LAB — VITAMIN D 25 HYDROXY (VIT D DEFICIENCY, FRACTURES): Vit D, 25-Hydroxy: 76.4 ng/mL (ref 30–100)

## 2022-11-06 LAB — FERRITIN: Ferritin: 201 ng/mL (ref 11–307)

## 2022-11-12 LAB — METHYLMALONIC ACID, SERUM: Methylmalonic Acid, Quantitative: 202 nmol/L (ref 0–378)

## 2022-11-13 DIAGNOSIS — H4312 Vitreous hemorrhage, left eye: Secondary | ICD-10-CM | POA: Diagnosis not present

## 2022-11-13 DIAGNOSIS — H43813 Vitreous degeneration, bilateral: Secondary | ICD-10-CM | POA: Diagnosis not present

## 2022-11-13 DIAGNOSIS — H26492 Other secondary cataract, left eye: Secondary | ICD-10-CM | POA: Diagnosis not present

## 2022-11-13 DIAGNOSIS — H33312 Horseshoe tear of retina without detachment, left eye: Secondary | ICD-10-CM | POA: Diagnosis not present

## 2022-11-14 ENCOUNTER — Telehealth: Payer: Medicare HMO | Admitting: Physician Assistant

## 2022-11-26 NOTE — Progress Notes (Unsigned)
VIRTUAL VISIT via TELEPHONE NOTE [facility]    I connected with [name]  on 11/27/2022 at 8:05 AM by telephone and verified that I am speaking with the correct person using two identifiers.  Location: Patient: Home Provider: [facility]   I discussed the limitations, risks, security and privacy concerns of performing an evaluation and management service by telephone and the availability of in person appointments. I also discussed with the patient that there may be a patient responsible charge related to this service. The patient expressed understanding and agreed to proceed.  REASON FOR VISIT:  Follow-up for iron deficiency anemia and vitamin B12 deficiency   CURRENT THERAPY: Intermittent IV iron + vitamin B12 injections  INTERVAL HISTORY:   [name] 72 y.o. female returns for routine follow-up of her iron deficiency anemia and vitamin B12 deficiency.  She was last evaluated via telemedicine visit by Rojelio Brenner PA-C on 07/17/2022.    At today's visit, she reports feeling somewhat poorly due to ongoing fatigue.  No recent hospitalizations, surgeries, or changes in baseline health status.   She had improved energy after her IV (Venofer 300 mg x 3) in January/February 2024, but reports that she is starting to feel low again over the past month.  She currently reports chronically fatigue with energy about 60%. She has some leg cramps and intermittent leg swelling.  She denies any pica, restless legs, headaches, chest pain, dyspnea on exertion, lightheadedness, or syncope.    Her Crohn's disease remains relatively well controlled, and she reports that she has not had any recent flareups.  She denies any blood loss such as hematemesis, hematochezia, melena, or epistaxis.  She continues to take her weekly vitamin D.  She has been receiving monthly vitamin B12 injections since July 2023, although she missed her injection in April 2024.    She has 60%  energy and 90% appetite. She endorses that she is maintaining a stable weight.  OBSERVATIONS/OBJECTIVE  Review of Systems  Constitutional:  Positive for malaise/fatigue. Negative for chills, diaphoresis, fever and weight loss.  Respiratory:  Negative for cough and shortness of breath.   Cardiovascular:  Negative for chest pain and palpitations.  Gastrointestinal:  Positive for constipation and diarrhea. Negative for abdominal pain, blood in stool, melena, nausea and vomiting.  Neurological:  Negative for dizziness and headaches.  Endo/Heme/Allergies:        Hair loss  Psychiatric/Behavioral:  The patient has insomnia.     PHYSICAL EXAM (per limitations of virtual telephone visit):   The patient is alert and oriented x 3, exhibiting adequate mentation, good mood, and ability to speak in full sentences and execute sound judgement.   ASSESSMENT & PLAN:  1.  Iron deficiency anemia  - Secondary to chronic blood loss and malabsorption in the setting of inflammatory bowel disease - Most recent IV iron with 300 mg x 3 in January 2024 - No hematemesis, hematochezia, or melena    - Symptomatic with chronic fatigue (energy 60%), which was improved after her most recent IV iron but has started to worsen again - Most recent labs (11/06/2022): Hgb 13.5, ferritin 201, iron saturation 26% - PLAN: No indication for IV iron at this time - Repeat labs and RTC in 4 months.      2.  B12 deficiency - She has been receiving monthly B12 injections since July/August 2023, increased to every 3 weeks since December 2023 (missed her last 2 injections in March/April 2024) -  Most recent B12 (11/06/2022): Vitamin B12 209, MMA normal.   - She is symptomatic with significant fatigue - PLAN: Recommend resuming B12 injections every 3 weeks.  Patient educated on importance of compliance.   - We will check a B12/methylmalonic acid at follow-up visit in 4 months.  3.  Vitamin D deficiency - Currently taking vitamin D  50,000 units weekly    - Most recent vitamin D (11/06/2022) is trending upward at 76.40 - PLAN: We will DECREASE vitamin D to every other week.  Recheck in 4 months.      4.  Crohn's disease - No active treatment - Last flare was 3+ years ago  PLAN SUMMARY: >> B12 injections every 3 weeks (starting next week) >> Labs in 4 months = CBC/D, ferritin, iron/TIBC, B12, MMA, vitamin D >> OFFICE visit in 4 months (1 week after labs)     I discussed the assessment and treatment plan with the patient. The patient was provided an opportunity to ask questions and all were answered. The patient agreed with the plan and demonstrated an understanding of the instructions.   The patient was advised to call back or seek an in-person evaluation if the symptoms worsen or if the condition fails to improve as anticipated.  I provided 22 minutes of non-face-to-face time during this encounter.  Carnella Guadalajara, PA-C 11/27/22 8:24 AM

## 2022-11-27 ENCOUNTER — Other Ambulatory Visit: Payer: Self-pay | Admitting: Physician Assistant

## 2022-11-27 ENCOUNTER — Other Ambulatory Visit: Payer: Self-pay

## 2022-11-27 ENCOUNTER — Inpatient Hospital Stay: Payer: Medicare HMO | Attending: Hematology | Admitting: Physician Assistant

## 2022-11-27 DIAGNOSIS — E559 Vitamin D deficiency, unspecified: Secondary | ICD-10-CM

## 2022-11-27 DIAGNOSIS — E538 Deficiency of other specified B group vitamins: Secondary | ICD-10-CM

## 2022-11-27 DIAGNOSIS — D509 Iron deficiency anemia, unspecified: Secondary | ICD-10-CM | POA: Insufficient documentation

## 2022-11-27 DIAGNOSIS — D500 Iron deficiency anemia secondary to blood loss (chronic): Secondary | ICD-10-CM

## 2022-11-27 MED ORDER — VITAMIN D (ERGOCALCIFEROL) 1.25 MG (50000 UNIT) PO CAPS: 50000.0000 [IU] | ORAL_CAPSULE | ORAL | 3 refills | Status: DC

## 2022-12-03 DIAGNOSIS — H26492 Other secondary cataract, left eye: Secondary | ICD-10-CM | POA: Diagnosis not present

## 2022-12-06 ENCOUNTER — Inpatient Hospital Stay: Payer: Medicare HMO

## 2022-12-06 VITALS — BP 151/70 | HR 76 | Temp 97.6°F | Resp 20

## 2022-12-06 DIAGNOSIS — D509 Iron deficiency anemia, unspecified: Secondary | ICD-10-CM | POA: Diagnosis not present

## 2022-12-06 DIAGNOSIS — E538 Deficiency of other specified B group vitamins: Secondary | ICD-10-CM

## 2022-12-06 DIAGNOSIS — D500 Iron deficiency anemia secondary to blood loss (chronic): Secondary | ICD-10-CM

## 2022-12-06 MED ORDER — CYANOCOBALAMIN 1000 MCG/ML IJ SOLN
1000.0000 ug | Freq: Once | INTRAMUSCULAR | Status: AC
  Administered 2022-12-06: 1000 ug via INTRAMUSCULAR
  Filled 2022-12-06: qty 1

## 2022-12-06 NOTE — Progress Notes (Signed)
[name] presents today for B12 injection per the provider's orders.  Stable during administration without incident; injection site WNL; see MAR for injection details.  Patient tolerated procedure well and without incident.  No questions or complaints noted at this time.  

## 2022-12-06 NOTE — Patient Instructions (Signed)
[facility]  Discharge Instructions: Thank you for choosing [facility] to provide your oncology and hematology care.  If you have a lab appointment with the Cancer Center - please note that after April 8th, 2024, all labs will be drawn in the cancer center.  You do not have to check in or register with the main entrance as you have in the past but will complete your check-in in the cancer center.  Wear comfortable clothing and clothing appropriate for easy access to any Portacath or PICC line.   We strive to give you quality time with your provider. You may need to reschedule your appointment if you arrive late (15 or more minutes).  Arriving late affects you and other patients whose appointments are after yours.  Also, if you miss three or more appointments without notifying the office, you may be dismissed from the clinic at the provider's discretion.      For prescription refill requests, have your pharmacy contact our office and allow 72 hours for refills to be completed.    Today you received your B-12 injection.    To help prevent nausea and vomiting after your treatment, we encourage you to take your nausea medication as directed.  BELOW ARE SYMPTOMS THAT SHOULD BE REPORTED IMMEDIATELY: *FEVER GREATER THAN 100.4 F (38 C) OR HIGHER *CHILLS OR SWEATING *NAUSEA AND VOMITING THAT IS NOT CONTROLLED WITH YOUR NAUSEA MEDICATION *UNUSUAL SHORTNESS OF BREATH *UNUSUAL BRUISING OR BLEEDING *URINARY PROBLEMS (pain or burning when urinating, or frequent urination) *BOWEL PROBLEMS (unusual diarrhea, constipation, pain near the anus) TENDERNESS IN MOUTH AND THROAT WITH OR WITHOUT PRESENCE OF ULCERS (sore throat, sores in mouth, or a toothache) UNUSUAL RASH, SWELLING OR PAIN  UNUSUAL VAGINAL DISCHARGE OR ITCHING   Items with * indicate a potential emergency and should be followed up as soon as possible or go to the Emergency Department if any problems should  occur.  Please show the CHEMOTHERAPY ALERT CARD or IMMUNOTHERAPY ALERT CARD at check-in to the Emergency Department and triage nurse.  Should you have questions after your visit or need to cancel or reschedule your appointment, please contact [facility] [phone]  and follow the prompts.  Office hours are 8:00 a.m. to 4:30 p.m. Monday - Friday. Please note that voicemails left after 4:00 p.m. may not be returned until the following business day.  We are closed weekends and major holidays. You have access to a nurse at all times for urgent questions. Please call the main number to the clinic [phone] and follow the prompts.  For any non-urgent questions, you may also contact your provider using MyChart. We now offer e-Visits for anyone 18 and older to request care online for non-urgent symptoms. For details visit [email or web address].   Also download the MyChart app! Go to the app store, search "MyChart", open the app, select [facility], and log in with your MyChart username and password.   

## 2022-12-06 NOTE — Progress Notes (Signed)
B12 injection  given per orders. Patient tolerated it well without problems. Vitals stable and discharged home from clinic ambulatory. Follow up as scheduled.  

## 2022-12-27 ENCOUNTER — Inpatient Hospital Stay: Payer: Medicare HMO

## 2022-12-27 VITALS — BP 132/74 | HR 68 | Temp 98.2°F | Resp 20

## 2022-12-27 DIAGNOSIS — E538 Deficiency of other specified B group vitamins: Secondary | ICD-10-CM | POA: Diagnosis not present

## 2022-12-27 DIAGNOSIS — D500 Iron deficiency anemia secondary to blood loss (chronic): Secondary | ICD-10-CM

## 2022-12-27 DIAGNOSIS — D509 Iron deficiency anemia, unspecified: Secondary | ICD-10-CM | POA: Diagnosis not present

## 2022-12-27 MED ORDER — CYANOCOBALAMIN 1000 MCG/ML IJ SOLN
1000.0000 ug | Freq: Once | INTRAMUSCULAR | Status: AC
  Administered 2022-12-27: 1000 ug via INTRAMUSCULAR
  Filled 2022-12-27: qty 1

## 2022-12-27 NOTE — Patient Instructions (Signed)
[facility]  Discharge Instructions: Thank you for choosing [facility] to provide your oncology and hematology care.  If you have a lab appointment with the Cancer Center - please note that after April 8th, 2024, all labs will be drawn in the cancer center.  You do not have to check in or register with the main entrance as you have in the past but will complete your check-in in the cancer center.  Wear comfortable clothing and clothing appropriate for easy access to any Portacath or PICC line.   We strive to give you quality time with your provider. You may need to reschedule your appointment if you arrive late (15 or more minutes).  Arriving late affects you and other patients whose appointments are after yours.  Also, if you miss three or more appointments without notifying the office, you may be dismissed from the clinic at the provider's discretion.      For prescription refill requests, have your pharmacy contact our office and allow 72 hours for refills to be completed.    Today you received the following B12 injection   To help prevent nausea and vomiting after your treatment, we encourage you to take your nausea medication as directed.  BELOW ARE SYMPTOMS THAT SHOULD BE REPORTED IMMEDIATELY: *FEVER GREATER THAN 100.4 F (38 C) OR HIGHER *CHILLS OR SWEATING *NAUSEA AND VOMITING THAT IS NOT CONTROLLED WITH YOUR NAUSEA MEDICATION *UNUSUAL SHORTNESS OF BREATH *UNUSUAL BRUISING OR BLEEDING *URINARY PROBLEMS (pain or burning when urinating, or frequent urination) *BOWEL PROBLEMS (unusual diarrhea, constipation, pain near the anus) TENDERNESS IN MOUTH AND THROAT WITH OR WITHOUT PRESENCE OF ULCERS (sore throat, sores in mouth, or a toothache) UNUSUAL RASH, SWELLING OR PAIN  UNUSUAL VAGINAL DISCHARGE OR ITCHING   Items with * indicate a potential emergency and should be followed up as soon as possible or go to the Emergency Department if any problems  should occur.  Please show the CHEMOTHERAPY ALERT CARD or IMMUNOTHERAPY ALERT CARD at check-in to the Emergency Department and triage nurse.  Should you have questions after your visit or need to cancel or reschedule your appointment, please contact [facility] [phone]  and follow the prompts.  Office hours are 8:00 a.m. to 4:30 p.m. Monday - Friday. Please note that voicemails left after 4:00 p.m. may not be returned until the following business day.  We are closed weekends and major holidays. You have access to a nurse at all times for urgent questions. Please call the main number to the clinic [phone] and follow the prompts.  For any non-urgent questions, you may also contact your provider using MyChart. We now offer e-Visits for anyone 18 and older to request care online for non-urgent symptoms. For details visit [email or web address].   Also download the MyChart app! Go to the app store, search "MyChart", open the app, select [facility], and log in with your MyChart username and password.   

## 2022-12-27 NOTE — Progress Notes (Signed)
B12 injection  given per orders. Patient tolerated it well without problems. Vitals stable and discharged home from clinic ambulatory. Follow up as scheduled.  

## 2023-01-16 DIAGNOSIS — Z20828 Contact with and (suspected) exposure to other viral communicable diseases: Secondary | ICD-10-CM | POA: Diagnosis not present

## 2023-01-16 DIAGNOSIS — Z1159 Encounter for screening for other viral diseases: Secondary | ICD-10-CM | POA: Diagnosis not present

## 2023-01-17 ENCOUNTER — Inpatient Hospital Stay: Payer: Medicare HMO | Attending: Hematology

## 2023-01-17 VITALS — BP 131/75 | HR 59 | Temp 97.0°F | Resp 18 | Ht 65.0 in | Wt 216.6 lb

## 2023-01-17 DIAGNOSIS — E538 Deficiency of other specified B group vitamins: Secondary | ICD-10-CM | POA: Diagnosis not present

## 2023-01-17 DIAGNOSIS — D500 Iron deficiency anemia secondary to blood loss (chronic): Secondary | ICD-10-CM

## 2023-01-17 MED ORDER — CYANOCOBALAMIN 1000 MCG/ML IJ SOLN
1000.0000 ug | Freq: Once | INTRAMUSCULAR | Status: AC
  Administered 2023-01-17: 1000 ug via INTRAMUSCULAR
  Filled 2023-01-17: qty 1

## 2023-01-17 NOTE — Patient Instructions (Signed)
[facility]  Discharge Instructions: Thank you for choosing [facility] to provide your oncology and hematology care.  If you have a lab appointment with the Cancer Center - please note that after April 8th, 2024, all labs will be drawn in the cancer center.  You do not have to check in or register with the main entrance as you have in the past but will complete your check-in in the cancer center.  Wear comfortable clothing and clothing appropriate for easy access to any Portacath or PICC line.   We strive to give you quality time with your provider. You may need to reschedule your appointment if you arrive late (15 or more minutes).  Arriving late affects you and other patients whose appointments are after yours.  Also, if you miss three or more appointments without notifying the office, you may be dismissed from the clinic at the provider's discretion.      For prescription refill requests, have your pharmacy contact our office and allow 72 hours for refills to be completed.    Today you received B12 injection.     BELOW ARE SYMPTOMS THAT SHOULD BE REPORTED IMMEDIATELY: *FEVER GREATER THAN 100.4 F (38 C) OR HIGHER *CHILLS OR SWEATING *NAUSEA AND VOMITING THAT IS NOT CONTROLLED WITH YOUR NAUSEA MEDICATION *UNUSUAL SHORTNESS OF BREATH *UNUSUAL BRUISING OR BLEEDING *URINARY PROBLEMS (pain or burning when urinating, or frequent urination) *BOWEL PROBLEMS (unusual diarrhea, constipation, pain near the anus) TENDERNESS IN MOUTH AND THROAT WITH OR WITHOUT PRESENCE OF ULCERS (sore throat, sores in mouth, or a toothache) UNUSUAL RASH, SWELLING OR PAIN  UNUSUAL VAGINAL DISCHARGE OR ITCHING   Items with * indicate a potential emergency and should be followed up as soon as possible or go to the Emergency Department if any problems should occur.  Please show the CHEMOTHERAPY ALERT CARD or IMMUNOTHERAPY ALERT CARD at check-in to the Emergency Department and triage  nurse.  Should you have questions after your visit or need to cancel or reschedule your appointment, please contact [facility] [phone]  and follow the prompts.  Office hours are 8:00 a.m. to 4:30 p.m. Monday - Friday. Please note that voicemails left after 4:00 p.m. may not be returned until the following business day.  We are closed weekends and major holidays. You have access to a nurse at all times for urgent questions. Please call the main number to the clinic [phone] and follow the prompts.  For any non-urgent questions, you may also contact your provider using MyChart. We now offer e-Visits for anyone 18 and older to request care online for non-urgent symptoms. For details visit [email or web address].   Also download the MyChart app! Go to the app store, search "MyChart", open the app, select [facility], and log in with your MyChart username and password.   

## 2023-01-17 NOTE — Progress Notes (Signed)
[name] presents today for injection per the provider's orders.  B12 administration without incident; injection site WNL; see MAR for injection details.  Patient tolerated procedure well and without incident.  No questions or complaints noted at this time. Discharged from clinic ambulatory in stable condition. Alert and oriented x 3. F/U with [facility] as scheduled.   

## 2023-02-07 ENCOUNTER — Inpatient Hospital Stay: Payer: Medicare HMO

## 2023-02-13 DIAGNOSIS — Z1159 Encounter for screening for other viral diseases: Secondary | ICD-10-CM | POA: Diagnosis not present

## 2023-02-13 DIAGNOSIS — Z20828 Contact with and (suspected) exposure to other viral communicable diseases: Secondary | ICD-10-CM | POA: Diagnosis not present

## 2023-02-17 ENCOUNTER — Telehealth: Payer: Self-pay | Admitting: Cardiology

## 2023-02-17 NOTE — Telephone Encounter (Signed)
Patient states she has had headaches that are different from migraines she has had in the past for the past few weeks. She usually has headaches every morning which get better as she moves around.She does not monitor he BP at home ,says she has never had issues with BP. She believes she has gained weight but does not have a reliable scale at home to confirm.She says her left leg swells sometimes and she has been told she has broken blood vessels in that leg.She also c/o intermittent numbness and she is concerned it is her heart. She cancelled her 02/07/23 vit B12 shot as she was having headaches then.   Upon reading her office note from 10/23, self -pay Coronary CT scoring was ordered but patient has not done.She declines to schedule an appointment today. She did make October follow from recall.   I advised her to go see her pcp to further work up her headaches and numbness.She is hesitant to see pcp as she states they tell her to go to Urgent care. I told her if her symptoms become acute to go to the ED for evaluation.   I will FYI Dr.Branch

## 2023-02-17 NOTE — Telephone Encounter (Signed)
Agree with pcp evaluation, her symptoms would not be cardiac related and something her pcp would be better suited to manage  Dominga Ferry MD

## 2023-02-17 NOTE — Telephone Encounter (Signed)
Pt c/o Shortness Of Breath: STAT if SOB developed within the last 24 hours or pt is noticeably SOB on the phone  1. Are you currently SOB (can you hear that pt is SOB on the phone)? No   2. How long have you been experiencing SOB? Couple of weeks   3. Are you SOB when sitting or when up moving around? Moving around   4. Are you currently experiencing any other symptoms? Patient states that her left side seems a bit weaker and has numbness that last week.

## 2023-02-28 ENCOUNTER — Inpatient Hospital Stay: Payer: Medicare HMO | Attending: Hematology

## 2023-02-28 VITALS — BP 139/80 | HR 65 | Temp 96.7°F | Resp 20

## 2023-02-28 DIAGNOSIS — E538 Deficiency of other specified B group vitamins: Secondary | ICD-10-CM | POA: Diagnosis not present

## 2023-02-28 DIAGNOSIS — D500 Iron deficiency anemia secondary to blood loss (chronic): Secondary | ICD-10-CM

## 2023-02-28 MED ORDER — CYANOCOBALAMIN 1000 MCG/ML IJ SOLN
1000.0000 ug | Freq: Once | INTRAMUSCULAR | Status: AC
  Administered 2023-02-28: 1000 ug via INTRAMUSCULAR
  Filled 2023-02-28: qty 1

## 2023-02-28 NOTE — Progress Notes (Signed)
Korin V Aylesworth presents today for injection per the provider's orders.  B12 administration without incident; injection site WNL; see MAR for injection details.  Patient tolerated procedure well and without incident.  No questions or complaints noted at this time. Discharged from clinic ambulatory in stable condition. Alert and oriented x 3. F/U with [facility] as scheduled.   

## 2023-02-28 NOTE — Patient Instructions (Signed)
[facility]  Discharge Instructions: Thank you for choosing [facility] to provide your oncology and hematology care.  If you have a lab appointment with the Cancer Center - please note that after April 8th, 2024, all labs will be drawn in the cancer center.  You do not have to check in or register with the main entrance as you have in the past but will complete your check-in in the cancer center.  Wear comfortable clothing and clothing appropriate for easy access to any Portacath or PICC line.   We strive to give you quality time with your provider. You may need to reschedule your appointment if you arrive late (15 or more minutes).  Arriving late affects you and other patients whose appointments are after yours.  Also, if you miss three or more appointments without notifying the office, you may be dismissed from the clinic at the provider's discretion.      For prescription refill requests, have your pharmacy contact our office and allow 72 hours for refills to be completed.    Today you received the following chemotherapy and/or immunotherapy agents Vitamin B12 injection.  Vitamin B12 Injection What is this medication? Vitamin B12 (VAHY tuh min B12) prevents and treats low vitamin B12 levels in your body. It is used in people who do not get enough vitamin B12 from their diet or when their digestive tract does not absorb enough. Vitamin B12 plays an important role in maintaining the health of your nervous system and red blood cells. This medicine may be used for other purposes; ask your health care provider or pharmacist if you have questions. COMMON BRAND NAME(S): B-12 Compliance Kit, B-12 Injection Kit, Cyomin, Dodex, LA-12, Nutri-Twelve, Physicians EZ Use B-12, Primabalt, Vitamin Deficiency Injectable System - B12 What should I tell my care team before I take this medication? They need to know if you have any of these conditions: Kidney disease Leber's  disease Megaloblastic anemia An unusual or allergic reaction to cyanocobalamin, cobalt, other medications, foods, dyes, or preservatives Pregnant or trying to get pregnant Breast-feeding How should I use this medication? This medication is injected into a muscle or deeply under the skin. It is usually given in a clinic or care team's office. However, your care team may teach you how to inject yourself. Follow all instructions. Talk to your care team about the use of this medication in children. Special care may be needed. Overdosage: If you think you have taken too much of this medicine contact a poison control center or emergency room at once. NOTE: This medicine is only for you. Do not share this medicine with others. What if I miss a dose? If you are given your dose at a clinic or care team's office, call to reschedule your appointment. If you give your own injections, and you miss a dose, take it as soon as you can. If it is almost time for your next dose, take only that dose. Do not take double or extra doses. What may interact with this medication? Alcohol Colchicine This list may not describe all possible interactions. Give your health care provider a list of all the medicines, herbs, non-prescription drugs, or dietary supplements you use. Also tell them if you smoke, drink alcohol, or use illegal drugs. Some items may interact with your medicine. What should I watch for while using this medication? Visit your care team regularly. You may need blood work done while you are taking this medication. You may  need to follow a special diet. Talk to your care team. Limit your alcohol intake and avoid smoking to get the best benefit. What side effects may I notice from receiving this medication? Side effects that you should report to your care team as soon as possible: Allergic reactions--skin rash, itching, hives, swelling of the face, lips, tongue, or throat Swelling of the ankles, hands, or  feet Trouble breathing Side effects that usually do not require medical attention (report to your care team if they continue or are bothersome): Diarrhea This list may not describe all possible side effects. Call your doctor for medical advice about side effects. You may report side effects to FDA at 1-800-FDA-1088. Where should I keep my medication? Keep out of the reach of children. Store at room temperature between 15 and 30 degrees C (59 and 85 degrees F). Protect from light. Throw away any unused medication after the expiration date. NOTE: This sheet is a summary. It may not cover all possible information. If you have questions about this medicine, talk to your doctor, pharmacist, or health care provider.  2024 Elsevier/Gold Standard (2021-03-27 00:00:00)       To help prevent nausea and vomiting after your treatment, we encourage you to take your nausea medication as directed.  BELOW ARE SYMPTOMS THAT SHOULD BE REPORTED IMMEDIATELY: *FEVER GREATER THAN 100.4 F (38 C) OR HIGHER *CHILLS OR SWEATING *NAUSEA AND VOMITING THAT IS NOT CONTROLLED WITH YOUR NAUSEA MEDICATION *UNUSUAL SHORTNESS OF BREATH *UNUSUAL BRUISING OR BLEEDING *URINARY PROBLEMS (pain or burning when urinating, or frequent urination) *BOWEL PROBLEMS (unusual diarrhea, constipation, pain near the anus) TENDERNESS IN MOUTH AND THROAT WITH OR WITHOUT PRESENCE OF ULCERS (sore throat, sores in mouth, or a toothache) UNUSUAL RASH, SWELLING OR PAIN  UNUSUAL VAGINAL DISCHARGE OR ITCHING   Items with * indicate a potential emergency and should be followed up as soon as possible or go to the Emergency Department if any problems should occur.  Please show the CHEMOTHERAPY ALERT CARD or IMMUNOTHERAPY ALERT CARD at check-in to the Emergency Department and triage nurse.  Should you have questions after your visit or need to cancel or reschedule your appointment, please contact [facility]ENTER AT [facility] [phone]  and  follow the prompts.  Office hours are 8:00 a.m. to 4:30 p.m. Monday - Friday. Please note that voicemails left after 4:00 p.m. may not be returned until the following business day.  We are closed weekends and major holidays. You have access to a nurse at all times for urgent questions. Please call the main number to the clinic [phone] and follow the prompts.  For any non-urgent questions, you may also contact your provider using MyChart. We now offer e-Visits for anyone 1 and older to request care online for non-urgent symptoms. For details visit [email or web address].   Also download the MyChart app! Go to the app store, search "MyChart", open the app, select , and log in with your MyChart username and password.

## 2023-03-21 ENCOUNTER — Inpatient Hospital Stay: Payer: Medicare HMO

## 2023-03-26 ENCOUNTER — Inpatient Hospital Stay: Payer: Medicare HMO

## 2023-03-26 VITALS — BP 112/88 | HR 65 | Temp 97.6°F | Resp 18

## 2023-03-26 DIAGNOSIS — E538 Deficiency of other specified B group vitamins: Secondary | ICD-10-CM

## 2023-03-26 DIAGNOSIS — D500 Iron deficiency anemia secondary to blood loss (chronic): Secondary | ICD-10-CM

## 2023-03-26 MED ORDER — CYANOCOBALAMIN 1000 MCG/ML IJ SOLN
1000.0000 ug | Freq: Once | INTRAMUSCULAR | Status: AC
  Administered 2023-03-26: 1000 ug via INTRAMUSCULAR
  Filled 2023-03-26: qty 1

## 2023-03-26 NOTE — Progress Notes (Signed)
Patient tolerated injection with no complaints voiced.  Site clean and dry with no bruising or swelling noted at site.  See MAR for details.  Band aid applied.  Patient stable during and after injection.  Vss with discharge and left in satisfactory condition with no s/s of distress noted.  

## 2023-03-26 NOTE — Patient Instructions (Signed)

## 2023-04-06 DIAGNOSIS — U071 COVID-19: Secondary | ICD-10-CM | POA: Diagnosis not present

## 2023-04-06 DIAGNOSIS — Z03818 Encounter for observation for suspected exposure to other biological agents ruled out: Secondary | ICD-10-CM | POA: Diagnosis not present

## 2023-04-11 ENCOUNTER — Inpatient Hospital Stay: Payer: Medicare HMO

## 2023-04-14 ENCOUNTER — Inpatient Hospital Stay: Payer: Medicare HMO | Attending: Hematology

## 2023-04-14 DIAGNOSIS — D500 Iron deficiency anemia secondary to blood loss (chronic): Secondary | ICD-10-CM

## 2023-04-14 DIAGNOSIS — E538 Deficiency of other specified B group vitamins: Secondary | ICD-10-CM

## 2023-04-14 DIAGNOSIS — E559 Vitamin D deficiency, unspecified: Secondary | ICD-10-CM

## 2023-04-14 LAB — CBC WITH DIFFERENTIAL/PLATELET
Abs Immature Granulocytes: 0.06 10*3/uL (ref 0.00–0.07)
Basophils Absolute: 0.1 10*3/uL (ref 0.0–0.1)
Basophils Relative: 1 %
Eosinophils Absolute: 0.1 10*3/uL (ref 0.0–0.5)
Eosinophils Relative: 1 %
HCT: 45.2 % (ref 36.0–46.0)
Hemoglobin: 14.7 g/dL (ref 12.0–15.0)
Immature Granulocytes: 1 %
Lymphocytes Relative: 24 %
Lymphs Abs: 1.9 10*3/uL (ref 0.7–4.0)
MCH: 29 pg (ref 26.0–34.0)
MCHC: 32.5 g/dL (ref 30.0–36.0)
MCV: 89.2 fL (ref 80.0–100.0)
Monocytes Absolute: 0.6 10*3/uL (ref 0.1–1.0)
Monocytes Relative: 8 %
Neutro Abs: 5.1 10*3/uL (ref 1.7–7.7)
Neutrophils Relative %: 65 %
Platelets: 222 10*3/uL (ref 150–400)
RBC: 5.07 MIL/uL (ref 3.87–5.11)
RDW: 13 % (ref 11.5–15.5)
WBC: 7.8 10*3/uL (ref 4.0–10.5)
nRBC: 0 % (ref 0.0–0.2)

## 2023-04-14 LAB — IRON AND TIBC
Iron: 86 ug/dL (ref 28–170)
Saturation Ratios: 31 % (ref 10.4–31.8)
TIBC: 277 ug/dL (ref 250–450)
UIBC: 191 ug/dL

## 2023-04-14 LAB — FERRITIN: Ferritin: 151 ng/mL (ref 11–307)

## 2023-04-14 LAB — VITAMIN D 25 HYDROXY (VIT D DEFICIENCY, FRACTURES): Vit D, 25-Hydroxy: 50.45 ng/mL (ref 30–100)

## 2023-04-14 LAB — VITAMIN B12: Vitamin B-12: 310 pg/mL (ref 180–914)

## 2023-04-16 NOTE — Progress Notes (Unsigned)
VIRTUAL VISIT via TELEPHONE NOTE [facility]    I connected with [name]  on 11/27/2022 at 8:05 AM by telephone and verified that I am speaking with the correct person using two identifiers.  Location: Patient: Home Provider: [facility]   I discussed the limitations, risks, security and privacy concerns of performing an evaluation and management service by telephone and the availability of in person appointments. I also discussed with the patient that there may be a patient responsible charge related to this service. The patient expressed understanding and agreed to proceed.  REASON FOR VISIT:  Follow-up for iron deficiency anemia and vitamin B12 deficiency   CURRENT THERAPY: Intermittent IV iron + vitamin B12 injections  INTERVAL HISTORY:   [name] 72 y.o. female returns for routine follow-up of her iron deficiency anemia and vitamin B12 deficiency.  She was last evaluated via telemedicine visit by Rojelio Brenner PA-C on 07/17/2022.    At today's visit, she reports feeling somewhat poorly due to ongoing fatigue.  No recent hospitalizations, surgeries, or changes in baseline health status.  Recived 1 dose of    She had improved energy after her IV (Venofer 300 mg x 3) in January/February 2024, but reports that she is starting to feel low again over the past month.  She currently reports chronically fatigue with energy about 60%. She has some leg cramps and intermittent leg swelling.  She denies any pica, restless legs, headaches, chest pain, dyspnea on exertion, lightheadedness, or syncope.    Her Crohn's disease remains relatively well controlled, and she reports that she has not had any recent flareups.  She denies any blood loss such as hematemesis, hematochezia, melena, or epistaxis.  She continues to take her weekly vitamin D.  She has been receiving monthly vitamin B12 injections since July 2023, although she missed her injection in April 2024.     She has 60% energy and 90% appetite. She endorses that she is maintaining a stable weight.  OBSERVATIONS/OBJECTIVE  Review of Systems  Constitutional:  Positive for malaise/fatigue. Negative for chills, diaphoresis, fever and weight loss.  Respiratory:  Negative for cough and shortness of breath.   Cardiovascular:  Negative for chest pain and palpitations.  Gastrointestinal:  Positive for constipation and diarrhea. Negative for abdominal pain, blood in stool, melena, nausea and vomiting.  Neurological:  Negative for dizziness and headaches.  Endo/Heme/Allergies:        Hair loss  Psychiatric/Behavioral:  The patient has insomnia.     PHYSICAL EXAM (per limitations of virtual telephone visit):   The patient is alert and oriented x 3, exhibiting adequate mentation, good mood, and ability to speak in full sentences and execute sound judgement.   ASSESSMENT & PLAN:  1.  Iron deficiency anemia  - Secondary to chronic blood loss and malabsorption in the setting of inflammatory bowel disease - Most recent IV iron with 300 mg x 3 in January 2024 - No hematemesis, hematochezia, or melena    - Symptomatic with chronic fatigue (energy 60%), which was improved after her most recent IV iron but has started to worsen again - Most recent labs (11/06/2022): Hgb 13.5, ferritin 201, iron saturation 26% - PLAN: No indication for IV iron at this time - Repeat labs and RTC in 4 months.      2.  B12 deficiency - She has been receiving monthly B12 injections since July/August 2023, increased to every 3 weeks since December 2023 (missed her last  2 injections in March/April 2024) - Most recent B12 (11/06/2022): Vitamin B12 209, MMA normal.   - She is symptomatic with significant fatigue - PLAN: Recommend resuming B12 injections every 3 weeks.  Patient educated on importance of compliance.   - We will check a B12/methylmalonic acid at follow-up visit in 4 months.  3.  Vitamin D deficiency - Currently  taking vitamin D 50,000 units weekly    - Most recent vitamin D (11/06/2022) is trending upward at 76.40 - PLAN: We will DECREASE vitamin D to every other week.  Recheck in 4 months.      4.  Crohn's disease - No active treatment - Last flare was 3+ years ago  PLAN SUMMARY: >> B12 injections every 3 weeks (starting next week) >> Labs in 4 months = CBC/D, ferritin, iron/TIBC, B12, MMA, vitamin D >> OFFICE visit in 4 months (1 week after labs)     I discussed the assessment and treatment plan with the patient. The patient was provided an opportunity to ask questions and all were answered. The patient agreed with the plan and demonstrated an understanding of the instructions.   The patient was advised to call back or seek an in-person evaluation if the symptoms worsen or if the condition fails to improve as anticipated.  I provided 22 minutes of non-face-to-face time during this encounter.  Carnella Guadalajara, PA-C 04/16/23 8:44 PM

## 2023-04-17 ENCOUNTER — Inpatient Hospital Stay: Payer: Medicare HMO

## 2023-04-17 ENCOUNTER — Inpatient Hospital Stay: Payer: Medicare HMO | Admitting: Oncology

## 2023-04-17 VITALS — BP 140/80 | HR 80 | Temp 97.9°F | Resp 16 | Wt 225.5 lb

## 2023-04-17 DIAGNOSIS — E559 Vitamin D deficiency, unspecified: Secondary | ICD-10-CM | POA: Diagnosis not present

## 2023-04-17 DIAGNOSIS — D500 Iron deficiency anemia secondary to blood loss (chronic): Secondary | ICD-10-CM

## 2023-04-17 DIAGNOSIS — E538 Deficiency of other specified B group vitamins: Secondary | ICD-10-CM | POA: Diagnosis not present

## 2023-04-17 MED ORDER — CYANOCOBALAMIN 1000 MCG/ML IJ SOLN
1000.0000 ug | Freq: Once | INTRAMUSCULAR | Status: AC
  Administered 2023-04-17: 1000 ug via INTRAMUSCULAR
  Filled 2023-04-17: qty 1

## 2023-04-17 NOTE — Progress Notes (Signed)
[name] presents today for injection per the provider's orders.  B12 administration without incident; injection site WNL; see MAR for injection details.  Patient tolerated procedure well and without incident.  No questions or complaints noted at this time. Discharged from clinic ambulatory in stable condition. Alert and oriented x 3. F/U with [facility] as scheduled.

## 2023-04-17 NOTE — Patient Instructions (Signed)
[facility]  Discharge Instructions: Thank you for choosing [facility] to provide your oncology and hematology care.  If you have a lab appointment with the Cancer Center - please note that after April 8th, 2024, all labs will be drawn in the cancer center.  You do not have to check in or register with the main entrance as you have in the past but will complete your check-in in the cancer center.  Wear comfortable clothing and clothing appropriate for easy access to any Portacath or PICC line.   We strive to give you quality time with your provider. You may need to reschedule your appointment if you arrive late (15 or more minutes).  Arriving late affects you and other patients whose appointments are after yours.  Also, if you miss three or more appointments without notifying the office, you may be dismissed from the clinic at the provider's discretion.      For prescription refill requests, have your pharmacy contact our office and allow 72 hours for refills to be completed.    Today you received Vit B12 injection.     BELOW ARE SYMPTOMS THAT SHOULD BE REPORTED IMMEDIATELY: *FEVER GREATER THAN 100.4 F (38 C) OR HIGHER *CHILLS OR SWEATING *NAUSEA AND VOMITING THAT IS NOT CONTROLLED WITH YOUR NAUSEA MEDICATION *UNUSUAL SHORTNESS OF BREATH *UNUSUAL BRUISING OR BLEEDING *URINARY PROBLEMS (pain or burning when urinating, or frequent urination) *BOWEL PROBLEMS (unusual diarrhea, constipation, pain near the anus) TENDERNESS IN MOUTH AND THROAT WITH OR WITHOUT PRESENCE OF ULCERS (sore throat, sores in mouth, or a toothache) UNUSUAL RASH, SWELLING OR PAIN  UNUSUAL VAGINAL DISCHARGE OR ITCHING   Items with * indicate a potential emergency and should be followed up as soon as possible or go to the Emergency Department if any problems should occur.  Please show the CHEMOTHERAPY ALERT CARD or IMMUNOTHERAPY ALERT CARD at check-in to the Emergency Department and  triage nurse.  Should you have questions after your visit or need to cancel or reschedule your appointment, please contact [facility] CENTER AT [facility] [phone]  and follow the prompts.  Office hours are 8:00 a.m. to 4:30 p.m. Monday - Friday. Please note that voicemails left after 4:00 p.m. may not be returned until the following business day.  We are closed weekends and major holidays. You have access to a nurse at all times for urgent questions. Please call the main number to the clinic [phone] and follow the prompts.  For any non-urgent questions, you may also contact your provider using MyChart. We now offer e-Visits for anyone 39 and older to request care online for non-urgent symptoms. For details visit [email or web address].   Also download the MyChart app! Go to the app store, search "MyChart", open the app, select [place], and log in with your MyChart username and password.

## 2023-04-23 DIAGNOSIS — Z03818 Encounter for observation for suspected exposure to other biological agents ruled out: Secondary | ICD-10-CM | POA: Diagnosis not present

## 2023-05-02 ENCOUNTER — Encounter: Payer: Self-pay | Admitting: Cardiology

## 2023-05-02 ENCOUNTER — Ambulatory Visit: Payer: Medicare HMO | Attending: Cardiology | Admitting: Cardiology

## 2023-05-02 VITALS — BP 134/86 | HR 71 | Ht 65.0 in | Wt 227.6 lb

## 2023-05-02 DIAGNOSIS — R0602 Shortness of breath: Secondary | ICD-10-CM

## 2023-05-02 DIAGNOSIS — R0789 Other chest pain: Secondary | ICD-10-CM

## 2023-05-02 DIAGNOSIS — Z8249 Family history of ischemic heart disease and other diseases of the circulatory system: Secondary | ICD-10-CM | POA: Diagnosis not present

## 2023-05-02 NOTE — Patient Instructions (Signed)
Medication Instructions:  Your physician recommends that you continue on your current medications as directed. Please refer to the Current Medication list given to you today.  *If you need a refill on your cardiac medications before your next appointment, please call your pharmacy*   Lab Work: None If you have labs (blood work) drawn today and your tests are completely normal, you will receive your results only by: MyChart Message (if you have MyChart) OR A paper copy in the mail If you have any lab test that is abnormal or we need to change your treatment, we will call you to review the results.   Testing/Procedures: Your physician has requested that you have an echocardiogram. Echocardiography is a painless test that uses sound waves to create images of your heart. It provides your doctor with information about the size and shape of your heart and how well your heart's chambers and valves are working. This procedure takes approximately one hour. There are no restrictions for this procedure. Please do NOT wear cologne, perfume, aftershave, or lotions (deodorant is allowed). Please arrive 15 minutes prior to your appointment time.    Follow-Up: At [facility], you and your health needs are our priority.  As part of our continuing mission to provide you with exceptional heart care, we have created designated Provider Care Teams.  These Care Teams include your primary Cardiologist (physician) and Advanced Practice Providers (APPs -  Physician Assistants and Nurse Practitioners) who all work together to provide you with the care you need, when you need it.  We recommend signing up for the patient portal called "MyChart".  Sign up information is provided on this After Visit Summary.  MyChart is used to connect with patients for Virtual Visits (Telemedicine).  Patients are able to view lab/test results, encounter notes, upcoming appointments, etc.  Non-urgent messages can be sent to your  provider as well.   To learn more about what you can do with MyChart, go to [email or web address].    Your next appointment:   6 month(s)  Provider:   You may see Dina Rich, MD or one of the following Advanced Practice Providers on your designated Care Team:   Randall An, PA-C  Jacolyn Reedy, [place]     Other Instructions

## 2023-05-02 NOTE — Progress Notes (Signed)
Clinical Summary Ms. Graybeal is a 72 y.o.female seen today for follow up of the following medical problems.   1.Chest pain/SOB - ER visit 06/27/21 with chest pain - Trop neg x 2. EKG NSR, no ischemic changes  - at initiial visit reported heaviness midchest. Could occur at rest or with activity. Some nausea associated. 7/10 in severity. Not positional. Constant for several days. Sometimes worst with eating.  - does a lot of walking at her job, mild DOE with activities   CAD risk factors: father with MIs 60s she believes  - some chest discomfort at times occasionally with eating, or laying down.   - SOB ongoing for a few months. DOE with walking form parking lot into church - occasional LE edema. Occasoinal SOB with laying flat.  - some cough, mild wheeze at times. No smoking history.   Up 16 lbs over the last year and half.     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    Past Medical History:  Diagnosis Date   Arthritis    B12 deficiency 07/09/2015   Crohn disease (HCC) JULY 2011 FEDA   PERSISTENT TI ULCERS seen on CE, NO NSAID USE   Endometriosis    HISTORY  LEADING TO HYSTERECTOMY   Fibromyalgia    Fibromyalgia 12/09/2013   Gastric ulcer    GERD (gastroesophageal reflux disease)    Headache    Helicobacter pylori gastritis    HISTORY   History of hiatal hernia    Hives    Iron (Fe) deficiency anemia 2009   JAN 2012 NL HB & FERRITIN   Obesity    Osteoporosis 11/20/2014   Plantar fasciitis      Allergies  Allergen Reactions   Cefzil [Cefprozil] Rash   Celebrex [Celecoxib] Hives and Itching   Sulfonamide Derivatives Itching     Current Outpatient Medications  Medication Sig Dispense Refill   acetaminophen (TYLENOL) 325 MG tablet Take 650 mg by mouth as needed for moderate pain or headache.     pantoprazole (PROTONIX) 40 MG tablet Take 1 tablet (40 mg total) by mouth daily. 30 tablet 3   Vitamin D, Ergocalciferol, (DRISDOL) 1.25 MG (50000 UNIT) CAPS capsule  Take 1 capsule (50,000 Units total) by mouth every 14 (fourteen) days. 6 capsule 3   No current facility-administered medications for this visit.   Facility-Administered Medications Ordered in Other Visits  Medication Dose Route Frequency Provider Last Rate Last Admin   0.9 %  sodium chloride infusion   Intravenous Continuous Doreatha Massed, MD   Paused at 11/17/18 0932   cyanocobalamin (VITAMIN B12) 1000 MCG/ML injection              Past Surgical History:  Procedure Laterality Date   ABDOMINAL HYSTERECTOMY     CATARACT EXTRACTION W/PHACO Right 12/05/2015   Procedure: CATARACT EXTRACTION PHACO AND INTRAOCULAR LENS PLACEMENT (IOC);  Surgeon: Galen Manila, MD;  Location: [facility] ORS;  Service: Ophthalmology;  Laterality: Right;  [place] 00:59.9 AP% 21.9 CDE 13.10 fluid pack lot # [id number]   CATARACT EXTRACTION W/PHACO Left 06/24/2016   Procedure: CATARACT EXTRACTION PHACO AND INTRAOCULAR LENS PLACEMENT LEFT EYE; CDE:  8.33;  Surgeon: Gemma Payor, MD;  Location: [facility];  Service: Ophthalmology;  Laterality: Left;   COLONOSCOPY     ESOPHAGOGASTRODUODENOSCOPY  10/14/2007   Esophagus showed 2 cm pink tongue seen extending from the GE junction.  Biopsies obtained via cold forceps.  Otherwise the  esophagus was without erosions, mass, ulceration, or  stricture / . Large hiatal hernia pouch with linear erosions, and two 3-6 mmulcers seen in the pouch.  Biopsies obtained via cold forceps to  evaluate for H. pylori gastritis or malignancy   Ileocolonoscopy  10/14/2007   Multiple 1-3 mm terminal ileum ulcers biopsied  The ulcers seemed to only involve the last 5 cm of her terminal ileum, and 10 cm of her ileum was visualized.  Otherwise the colon  was without polyps, masses, diverticula, or AVMs.  She had a normal retroflexed view of the rectum   OVARIAN CYST REMOVAL     TOTAL ABDOMINAL HYSTERECTOMY W/ BILATERAL SALPINGOOPHORECTOMY     UPPER GASTROINTESTINAL ENDOSCOPY  JULY 2011 DIARRHEA/FEDA     Bx-H.PYLORI GASTRITIS, NL DUODENUM     Allergies  Allergen Reactions   Cefzil [Cefprozil] Rash   Celebrex [Celecoxib] Hives and Itching   Sulfonamide Derivatives Itching      Family History  Problem Relation Age of Onset   Diabetes Mother    Cancer Father    Diabetes Father    Diabetes Sister    Diabetes Brother    Colon polyps Neg Hx    Colon cancer Neg Hx    Stroke Neg Hx      Social History Ms. Sanker reports that she has never smoked. She has never used smokeless tobacco. Ms. Bittinger reports no history of alcohol use.   Review of Systems CONSTITUTIONAL: No weight loss, fever, chills, weakness or fatigue.  HEENT: Eyes: No visual loss, blurred vision, double vision or yellow sclerae.No hearing loss, sneezing, congestion, runny nose or sore throat.  SKIN: No rash or itching.  CARDIOVASCULAR: per hpi RESPIRATORY: per hpi GASTROINTESTINAL: No anorexia, nausea, vomiting or diarrhea. No abdominal pain or blood.  GENITOURINARY: No burning on urination, no polyuria NEUROLOGICAL: No headache, dizziness, syncope, paralysis, ataxia, numbness or tingling in the extremities. No change in bowel or bladder control.  MUSCULOSKELETAL: No muscle, back pain, joint pain or stiffness.  LYMPHATICS: No enlarged nodes. No history of splenectomy.  PSYCHIATRIC: No history of depression or anxiety.  ENDOCRINOLOGIC: No reports of sweating, cold or heat intolerance. No polyuria or polydipsia.  Marland Kitchen   Physical Examination Today's Vitals   05/02/23 1006  BP: 134/86  Pulse: 71  SpO2: 96%  Weight: 227 lb 9.6 oz (103.2 kg)  Height: 5\' 5"  (1.651 m)   Body mass index is 37.87 kg/m.  Gen: resting comfortably, no acute distress HEENT: no scleral icterus, pupils equal round and reactive, no palptable cervical adenopathy,  CV: RRR, no m/rg no jvd Resp: Clear to auscultation bilaterally GI: abdomen is soft, non-tender, non-distended, normal bowel sounds, no hepatosplenomegaly MSK: extremities are  warm, no edema.  Skin: warm, no rash Neuro:  no focal deficits Psych: appropriate affect   Diagnostic Studies     Assessment and Plan   1.Chest pain/SOB - chest pain rare, remains noncardiac in description  -some recent SOB/DOE of unclear etiology - will plan for echo to evaluate any cardiac dysfunction - 16 lbs weight gain since last year, may be deconditioning - if progressive symptoms or exertional chest pain over time may consider ischemic testing at that time - EKG today shows NSR, no ischemic changes.   F/u 6 months     Antoine Poche, M.D

## 2023-05-08 MED FILL — Iron Sucrose Inj 20 MG/ML (Fe Equiv): INTRAVENOUS | Qty: 20 | Status: AC

## 2023-05-09 ENCOUNTER — Inpatient Hospital Stay: Payer: Medicare HMO

## 2023-05-09 ENCOUNTER — Inpatient Hospital Stay: Payer: Medicare HMO | Attending: Hematology

## 2023-05-09 VITALS — BP 133/68 | HR 69 | Temp 97.5°F | Resp 20

## 2023-05-09 DIAGNOSIS — D500 Iron deficiency anemia secondary to blood loss (chronic): Secondary | ICD-10-CM

## 2023-05-09 DIAGNOSIS — E538 Deficiency of other specified B group vitamins: Secondary | ICD-10-CM

## 2023-05-09 DIAGNOSIS — D509 Iron deficiency anemia, unspecified: Secondary | ICD-10-CM | POA: Insufficient documentation

## 2023-05-09 MED ORDER — CYANOCOBALAMIN 1000 MCG/ML IJ SOLN
1000.0000 ug | Freq: Once | INTRAMUSCULAR | Status: AC
  Administered 2023-05-09: 1000 ug via INTRAMUSCULAR
  Filled 2023-05-09: qty 1

## 2023-05-09 MED ORDER — SODIUM CHLORIDE 0.9 % IV SOLN
400.0000 mg | Freq: Once | INTRAVENOUS | Status: AC
  Administered 2023-05-09: 400 mg via INTRAVENOUS
  Filled 2023-05-09: qty 20

## 2023-05-09 MED ORDER — SODIUM CHLORIDE 0.9 % IV SOLN: Freq: Once | INTRAVENOUS | Status: AC

## 2023-05-09 MED ORDER — ACETAMINOPHEN 325 MG PO TABS
650.0000 mg | ORAL_TABLET | Freq: Once | ORAL | Status: DC
  Filled 2023-05-09: qty 2

## 2023-05-09 MED ORDER — CETIRIZINE HCL 10 MG/ML IV SOLN
10.0000 mg | Freq: Once | INTRAVENOUS | Status: DC
  Filled 2023-05-09: qty 1

## 2023-05-09 NOTE — Progress Notes (Signed)
Pt tolerated Venofer infusion well with no complications. Blood return noted from IV site before and after infusion. RN educated pt on the importance of notifying the clinic if complications occur, pt verbalized understanding.Pt ambulatory and stable at discharge. All questions answered at this time. Follow ups as scheduled.   Usama Harkless [employer]

## 2023-05-09 NOTE — Patient Instructions (Signed)
 [facility]  Discharge Instructions: Thank you for choosing [facility] to provide your oncology and hematology care.  If you have a lab appointment with the Cancer Center - please note that after April 8th, 2024, all labs will be drawn in the cancer center.  You do not have to check in or register with the main entrance as you have in the past but will complete your check-in in the cancer center.  Wear comfortable clothing and clothing appropriate for easy access to any Portacath or PICC line.   We strive to give you quality time with your provider. You may need to reschedule your appointment if you arrive late (15 or more minutes).  Arriving late affects you and other patients whose appointments are after yours.  Also, if you miss three or more appointments without notifying the office, you may be dismissed from the clinic at the provider's discretion.      For prescription refill requests, have your pharmacy contact our office and allow 72 hours for refills to be completed.    Today you received the following:  Vitamin B12  Vitamin B12 Injection What is this medication? Vitamin B12 (VAHY tuh min B12) prevents and treats low vitamin B12 levels in your body. It is used in people who do not get enough vitamin B12 from their diet or when their digestive tract does not absorb enough. Vitamin B12 plays an important role in maintaining the health of your nervous system and red blood cells. This medicine may be used for other purposes; ask your health care provider or pharmacist if you have questions. COMMON BRAND NAME(S): B-12 Compliance Kit, B-12 Injection Kit, Cyomin, Dodex, LA-12, Nutri-Twelve, Physicians EZ Use B-12, Primabalt, Vitamin Deficiency Injectable System - B12 What should I tell my care team before I take this medication? They need to know if you have any of these conditions: Kidney disease Leber's disease Megaloblastic anemia An unusual or allergic  reaction to cyanocobalamin, cobalt, other medications, foods, dyes, or preservatives Pregnant or trying to get pregnant Breast-feeding How should I use this medication? This medication is injected into a muscle or deeply under the skin. It is usually given in a clinic or care team's office. However, your care team may teach you how to inject yourself. Follow all instructions. Talk to your care team about the use of this medication in children. Special care may be needed. Overdosage: If you think you have taken too much of this medicine contact a poison control center or emergency room at once. NOTE: This medicine is only for you. Do not share this medicine with others. What if I miss a dose? If you are given your dose at a clinic or care team's office, call to reschedule your appointment. If you give your own injections, and you miss a dose, take it as soon as you can. If it is almost time for your next dose, take only that dose. Do not take double or extra doses. What may interact with this medication? Alcohol Colchicine This list may not describe all possible interactions. Give your health care provider a list of all the medicines, herbs, non-prescription drugs, or dietary supplements you use. Also tell them if you smoke, drink alcohol, or use illegal drugs. Some items may interact with your medicine. What should I watch for while using this medication? Visit your care team regularly. You may need blood work done while you are taking this medication. You may need to follow a  special diet. Talk to your care team. Limit your alcohol intake and avoid smoking to get the best benefit. What side effects may I notice from receiving this medication? Side effects that you should report to your care team as soon as possible: Allergic reactions--skin rash, itching, hives, swelling of the face, lips, tongue, or throat Swelling of the ankles, hands, or feet Trouble breathing Side effects that usually do  not require medical attention (report to your care team if they continue or are bothersome): Diarrhea This list may not describe all possible side effects. Call your doctor for medical advice about side effects. You may report side effects to FDA at 1-800-FDA-1088. Where should I keep my medication? Keep out of the reach of children. Store at room temperature between 15 and 30 degrees C (59 and 85 degrees F). Protect from light. Throw away any unused medication after the expiration date. NOTE: This sheet is a summary. It may not cover all possible information. If you have questions about this medicine, talk to your doctor, pharmacist, or health care provider.  2024 Elsevier/Gold Standard (2021-03-27 00:00:00)     To help prevent nausea and vomiting after your treatment, we encourage you to take your nausea medication as directed.  BELOW ARE SYMPTOMS THAT SHOULD BE REPORTED IMMEDIATELY: *FEVER GREATER THAN 100.4 F (38 C) OR HIGHER *CHILLS OR SWEATING *NAUSEA AND VOMITING THAT IS NOT CONTROLLED WITH YOUR NAUSEA MEDICATION *UNUSUAL SHORTNESS OF BREATH *UNUSUAL BRUISING OR BLEEDING *URINARY PROBLEMS (pain or burning when urinating, or frequent urination) *BOWEL PROBLEMS (unusual diarrhea, constipation, pain near the anus) TENDERNESS IN MOUTH AND THROAT WITH OR WITHOUT PRESENCE OF ULCERS (sore throat, sores in mouth, or a toothache) UNUSUAL RASH, SWELLING OR PAIN  UNUSUAL VAGINAL DISCHARGE OR ITCHING   Items with * indicate a potential emergency and should be followed up as soon as possible or go to the Emergency Department if any problems should occur.  Please show the CHEMOTHERAPY ALERT CARD or IMMUNOTHERAPY ALERT CARD at check-in to the Emergency Department and triage nurse.  Should you have questions after your visit or need to cancel or reschedule your appointment, please contact [facility]ENTER AT [facility] [phone]  and follow the prompts.  Office hours are 8:00 a.m. to 4:30  p.m. Monday - Friday. Please note that voicemails left after 4:00 p.m. may not be returned until the following business day.  We are closed weekends and major holidays. You have access to a nurse at all times for urgent questions. Please call the main number to the clinic [phone] and follow the prompts.  For any non-urgent questions, you may also contact your provider using MyChart. We now offer e-Visits for anyone 27 and older to request care online for non-urgent symptoms. For details visit [email or web address].   Also download the MyChart app! Go to the app store, search "MyChart", open the app, select [place], and log in with your MyChart username and password.

## 2023-05-09 NOTE — Progress Notes (Signed)
Patient tolerated Vitamin B 12 injection with no complaints voiced.  Site clean and dry with no bruising or swelling noted.  No complaints of pain.  Discharged with vital signs stable and no signs or symptoms of distress noted.   ?

## 2023-05-09 NOTE — Progress Notes (Deleted)
Patient tolerated Vitamin B 12 injection with no complaints voiced.  Site clean and dry with no bruising or swelling noted.  No complaints of pain.  Discharged with vital signs stable and no signs or symptoms of distress noted.   ?

## 2023-05-09 NOTE — Patient Instructions (Signed)
 [facility]  Discharge Instructions: Thank you for choosing [facility] to provide your oncology and hematology care.  If you have a lab appointment with the Cancer Center - please note that after April 8th, 2024, all labs will be drawn in the cancer center.  You do not have to check in or register with the main entrance as you have in the past but will complete your check-in in the cancer center.  Wear comfortable clothing and clothing appropriate for easy access to any Portacath or PICC line.   We strive to give you quality time with your provider. You may need to reschedule your appointment if you arrive late (15 or more minutes).  Arriving late affects you and other patients whose appointments are after yours.  Also, if you miss three or more appointments without notifying the office, you may be dismissed from the clinic at the provider's discretion.      For prescription refill requests, have your pharmacy contact our office and allow 72 hours for refills to be completed.    Today you received the following:  Vitamin B12   To help prevent nausea and vomiting after your treatment, we encourage you to take your nausea medication as directed.  BELOW ARE SYMPTOMS THAT SHOULD BE REPORTED IMMEDIATELY: *FEVER GREATER THAN 100.4 F (38 C) OR HIGHER *CHILLS OR SWEATING *NAUSEA AND VOMITING THAT IS NOT CONTROLLED WITH YOUR NAUSEA MEDICATION *UNUSUAL SHORTNESS OF BREATH *UNUSUAL BRUISING OR BLEEDING *URINARY PROBLEMS (pain or burning when urinating, or frequent urination) *BOWEL PROBLEMS (unusual diarrhea, constipation, pain near the anus) TENDERNESS IN MOUTH AND THROAT WITH OR WITHOUT PRESENCE OF ULCERS (sore throat, sores in mouth, or a toothache) UNUSUAL RASH, SWELLING OR PAIN  UNUSUAL VAGINAL DISCHARGE OR ITCHING   Items with * indicate a potential emergency and should be followed up as soon as possible or go to the Emergency Department if any problems  should occur.  Please show the CHEMOTHERAPY ALERT CARD or IMMUNOTHERAPY ALERT CARD at check-in to the Emergency Department and triage nurse.  Should you have questions after your visit or need to cancel or reschedule your appointment, please contact [facility] [phone]  and follow the prompts.  Office hours are 8:00 a.m. to 4:30 p.m. Monday - Friday. Please note that voicemails left after 4:00 p.m. may not be returned until the following business day.  We are closed weekends and major holidays. You have access to a nurse at all times for urgent questions. Please call the main number to the clinic [phone] and follow the prompts.  For any non-urgent questions, you may also contact your provider using MyChart. We now offer e-Visits for anyone 58 and older to request care online for non-urgent symptoms. For details visit [email or web address].   Also download the MyChart app! Go to the app store, search "MyChart", open the app, select , and log in with your MyChart username and password.

## 2023-05-15 MED FILL — Iron Sucrose Inj 20 MG/ML (Fe Equiv): INTRAVENOUS | Qty: 20 | Status: AC

## 2023-05-16 ENCOUNTER — Inpatient Hospital Stay: Payer: Medicare HMO

## 2023-05-16 VITALS — BP 134/80 | HR 67 | Temp 97.5°F | Resp 17

## 2023-05-16 DIAGNOSIS — E538 Deficiency of other specified B group vitamins: Secondary | ICD-10-CM | POA: Diagnosis not present

## 2023-05-16 DIAGNOSIS — D500 Iron deficiency anemia secondary to blood loss (chronic): Secondary | ICD-10-CM

## 2023-05-16 DIAGNOSIS — D509 Iron deficiency anemia, unspecified: Secondary | ICD-10-CM | POA: Diagnosis not present

## 2023-05-16 MED ORDER — SODIUM CHLORIDE 0.9 % IV SOLN: Freq: Once | INTRAVENOUS | Status: AC

## 2023-05-16 MED ORDER — SODIUM CHLORIDE 0.9 % IV SOLN
400.0000 mg | Freq: Once | INTRAVENOUS | Status: AC
  Administered 2023-05-16: 400 mg via INTRAVENOUS
  Filled 2023-05-16: qty 20

## 2023-05-16 NOTE — Patient Instructions (Signed)
[facility] [place]  Discharge Instructions: Thank you for choosing [facility] to provide your oncology and hematology care.  If you have a lab appointment with the Cancer Center - please note that after April 8th, 2024, all labs will be drawn in the cancer center.  You do not have to check in or register with the main entrance as you have in the past but will complete your check-in in the cancer center.  Wear comfortable clothing and clothing appropriate for easy access to any Portacath or PICC line.   We strive to give you quality time with your provider. You may need to reschedule your appointment if you arrive late (15 or more minutes).  Arriving late affects you and other patients whose appointments are after yours.  Also, if you miss three or more appointments without notifying the office, you may be dismissed from the clinic at the provider's discretion.      For prescription refill requests, have your pharmacy contact our office and allow 72 hours for refills to be completed.    Today you received the following chemotherapy and/or immunotherapy agents Venofer      To help prevent nausea and vomiting after your treatment, we encourage you to take your nausea medication as directed.  BELOW ARE SYMPTOMS THAT SHOULD BE REPORTED IMMEDIATELY: *FEVER GREATER THAN 100.4 F (38 C) OR HIGHER *CHILLS OR SWEATING *NAUSEA AND VOMITING THAT IS NOT CONTROLLED WITH YOUR NAUSEA MEDICATION *UNUSUAL SHORTNESS OF BREATH *UNUSUAL BRUISING OR BLEEDING *URINARY PROBLEMS (pain or burning when urinating, or frequent urination) *BOWEL PROBLEMS (unusual diarrhea, constipation, pain near the anus) TENDERNESS IN MOUTH AND THROAT WITH OR WITHOUT PRESENCE OF ULCERS (sore throat, sores in mouth, or a toothache) UNUSUAL RASH, SWELLING OR PAIN  UNUSUAL VAGINAL DISCHARGE OR ITCHING   Items with * indicate a potential emergency and should be followed up as soon as possible or go to the  Emergency Department if any problems should occur.  Please show the CHEMOTHERAPY ALERT CARD or IMMUNOTHERAPY ALERT CARD at check-in to the Emergency Department and triage nurse.  Should you have questions after your visit or need to cancel or reschedule your appointment, please contact [facility] [place] [phone]  and follow the prompts.  Office hours are 8:00 a.m. to 4:30 p.m. Monday - Friday. Please note that voicemails left after 4:00 p.m. may not be returned until the following business day.  We are closed weekends and major holidays. You have access to a nurse at all times for urgent questions. Please call the main number to the clinic [phone] and follow the prompts.  For any non-urgent questions, you may also contact your provider using MyChart. We now offer e-Visits for anyone 18 and older to request care online for non-urgent symptoms. For details visit [email or web address].   Also download the MyChart app! Go to the app store, search "MyChart", open the app, select [place], and log in with your MyChart username and password.   

## 2023-05-16 NOTE — Progress Notes (Signed)
Patient presents today for Venofer infusion per providers order.  Vital signs WNL.  Patient has no new complaints at this time.  Peripheral IV started and blood return noted pre and post infusion.  Stable during infusion without adverse affects.  Vital signs stable.  No complaints at this time.  Discharge from clinic ambulatory in stable condition.  Alert and oriented X 3.  Follow up with [facility] as scheduled.  

## 2023-05-20 DIAGNOSIS — E119 Type 2 diabetes mellitus without complications: Secondary | ICD-10-CM | POA: Diagnosis not present

## 2023-05-20 DIAGNOSIS — I10 Essential (primary) hypertension: Secondary | ICD-10-CM | POA: Diagnosis not present

## 2023-05-20 DIAGNOSIS — R7989 Other specified abnormal findings of blood chemistry: Secondary | ICD-10-CM | POA: Diagnosis not present

## 2023-06-04 ENCOUNTER — Inpatient Hospital Stay: Payer: Medicare HMO | Attending: Hematology

## 2023-06-04 ENCOUNTER — Inpatient Hospital Stay: Payer: Medicare HMO

## 2023-06-04 VITALS — BP 138/60 | HR 81 | Temp 98.2°F | Resp 18

## 2023-06-04 DIAGNOSIS — E538 Deficiency of other specified B group vitamins: Secondary | ICD-10-CM | POA: Diagnosis not present

## 2023-06-04 DIAGNOSIS — D500 Iron deficiency anemia secondary to blood loss (chronic): Secondary | ICD-10-CM

## 2023-06-04 MED ORDER — CYANOCOBALAMIN 1000 MCG/ML IJ SOLN
1000.0000 ug | Freq: Once | INTRAMUSCULAR | Status: AC
  Administered 2023-06-04: 1000 ug via INTRAMUSCULAR
  Filled 2023-06-04: qty 1

## 2023-06-04 NOTE — Patient Instructions (Signed)
[facility] - A DE[facility]  Discharge Instructions: Thank you for choosing [facility] to provide your oncology and hematology care.  If you have a lab appointment with the Cancer Center - please note that after April 8th, 2024, all labs will be drawn in the cancer center.  You do not have to check in or register with the main entrance as you have in the past but will complete your check-in in the cancer center.  Wear comfortable clothing and clothing appropriate for easy access to any Portacath or PICC line.   We strive to give you quality time with your provider. You may need to reschedule your appointment if you arrive late (15 or more minutes).  Arriving late affects you and other patients whose appointments are after yours.  Also, if you miss three or more appointments without notifying the office, you may be dismissed from the clinic at the provider's discretion.      For prescription refill requests, have your pharmacy contact our office and allow 72 hours for refills to be completed.    Today you received the following B12.  Vitamin B12 Injection What is this medication? Vitamin B12 (VAHY tuh min B12) prevents and treats low vitamin B12 levels in your body. It is used in people who do not get enough vitamin B12 from their diet or when their digestive tract does not absorb enough. Vitamin B12 plays an important role in maintaining the health of your nervous system and red blood cells. This medicine may be used for other purposes; ask your health care provider or pharmacist if you have questions. COMMON BRAND NAME(S): B-12 Compliance Kit, B-12 Injection Kit, Cyomin, Dodex, LA-12, Nutri-Twelve, Physicians EZ Use B-12, Primabalt, Vitamin Deficiency Injectable System - B12 What should I tell my care team before I take this medication? They need to know if you have any of these conditions: Kidney disease Leber's disease Megaloblastic  anemia An unusual or allergic reaction to cyanocobalamin, cobalt, other medications, foods, dyes, or preservatives Pregnant or trying to get pregnant Breast-feeding How should I use this medication? This medication is injected into a muscle or deeply under the skin. It is usually given in a clinic or care team's office. However, your care team may teach you how to inject yourself. Follow all instructions. Talk to your care team about the use of this medication in children. Special care may be needed. Overdosage: If you think you have taken too much of this medicine contact a poison control center or emergency room at once. NOTE: This medicine is only for you. Do not share this medicine with others. What if I miss a dose? If you are given your dose at a clinic or care team's office, call to reschedule your appointment. If you give your own injections, and you miss a dose, take it as soon as you can. If it is almost time for your next dose, take only that dose. Do not take double or extra doses. What may interact with this medication? Alcohol Colchicine This list may not describe all possible interactions. Give your health care provider a list of all the medicines, herbs, non-prescription drugs, or dietary supplements you use. Also tell them if you smoke, drink alcohol, or use illegal drugs. Some items may interact with your medicine. What should I watch for while using this medication? Visit your care team regularly. You may need blood work done while you are taking this medication.  You may need to follow a special diet. Talk to your care team. Limit your alcohol intake and avoid smoking to get the best benefit. What side effects may I notice from receiving this medication? Side effects that you should report to your care team as soon as possible: Allergic reactions--skin rash, itching, hives, swelling of the face, lips, tongue, or throat Swelling of the ankles, hands, or feet Trouble  breathing Side effects that usually do not require medical attention (report to your care team if they continue or are bothersome): Diarrhea This list may not describe all possible side effects. Call your doctor for medical advice about side effects. You may report side effects to FDA at 1-800-FDA-1088. Where should I keep my medication? Keep out of the reach of children. Store at room temperature between 15 and 30 degrees C (59 and 85 degrees F). Protect from light. Throw away any unused medication after the expiration date. NOTE: This sheet is a summary. It may not cover all possible information. If you have questions about this medicine, talk to your doctor, pharmacist, or health care provider.  2024 Elsevier/Gold Standard (2021-03-27 00:00:00)    To help prevent nausea and vomiting after your treatment, we encourage you to take your nausea medication as directed.  BELOW ARE SYMPTOMS THAT SHOULD BE REPORTED IMMEDIATELY: *FEVER GREATER THAN 100.4 F (38 C) OR HIGHER *CHILLS OR SWEATING *NAUSEA AND VOMITING THAT IS NOT CONTROLLED WITH YOUR NAUSEA MEDICATION *UNUSUAL SHORTNESS OF BREATH *UNUSUAL BRUISING OR BLEEDING *URINARY PROBLEMS (pain or burning when urinating, or frequent urination) *BOWEL PROBLEMS (unusual diarrhea, constipation, pain near the anus) TENDERNESS IN MOUTH AND THROAT WITH OR WITHOUT PRESENCE OF ULCERS (sore throat, sores in mouth, or a toothache) UNUSUAL RASH, SWELLING OR PAIN  UNUSUAL VAGINAL DISCHARGE OR ITCHING   Items with * indicate a potential emergency and should be followed up as soon as possible or go to the Emergency Department if any problems should occur.  Please show the CHEMOTHERAPY ALERT CARD or IMMUNOTHERAPY ALERT CARD at check-in to the Emergency Department and triage nurse.  Should you have questions after your visit or need to cancel or reschedule your appointment, please contact [facility] - A DEPT OF [facility]  [phone]  and follow the prompts.  Office hours are 8:00 a.m. to 4:30 p.m. Monday - Friday. Please note that voicemails left after 4:00 p.m. may not be returned until the following business day.  We are closed weekends and major holidays. You have access to a nurse at all times for urgent questions. Please call the main number to the clinic [phone] and follow the prompts.  For any non-urgent questions, you may also contact your provider using MyChart. We now offer e-Visits for anyone 30 and older to request care online for non-urgent symptoms. For details visit [email or web address].   Also download the MyChart app! Go to the app store, search "MyChart", open the app, select [place], and log in with your MyChart username and password.

## 2023-06-04 NOTE — Progress Notes (Signed)
Patient presents today for B12 injection. Patient tolerated injection in right deltoid with no complaints voiced.  Site clean and dry with no bruising or swelling noted.  No complaints of pain.  Discharged with vital signs stable and no signs or symptoms of distress noted.

## 2023-06-11 ENCOUNTER — Ambulatory Visit (HOSPITAL_COMMUNITY)
Admission: RE | Admit: 2023-06-11 | Discharge: 2023-06-11 | Disposition: A | Discharge status: Home / Self Care | Payer: Medicare HMO | Source: Ambulatory Visit | Attending: Cardiology | Admitting: Cardiology

## 2023-06-11 DIAGNOSIS — R0602 Shortness of breath: Secondary | ICD-10-CM | POA: Insufficient documentation

## 2023-06-11 LAB — ECHOCARDIOGRAM COMPLETE
Area-P 1/2: 2.95 cm2
S' Lateral: 2.8 cm

## 2023-06-11 NOTE — Progress Notes (Signed)
*  PRELIMINARY RESULTS* Echocardiogram 2D Echocardiogram has been performed.  Stacey Drain 06/11/2023, 11:34 AM

## 2023-06-13 ENCOUNTER — Telehealth: Payer: Self-pay | Admitting: Cardiology

## 2023-06-13 NOTE — Telephone Encounter (Signed)
Patient is calling to receive her results.

## 2023-06-13 NOTE — Telephone Encounter (Signed)
Please disregard

## 2023-06-17 NOTE — Telephone Encounter (Signed)
Echo shows heart pumping function is normal. She has some mild age related stiffness of the heart muscle which is common at her age and a minor finding. Overall echo looks good   Dominga Ferry MD

## 2023-06-17 NOTE — Telephone Encounter (Signed)
Left a message for patient to call office back regarding results.  

## 2023-06-17 NOTE — Telephone Encounter (Signed)
Spoke to pt who verbalized understanding of results. Patient had no questions or concerns at this time.

## 2023-06-25 ENCOUNTER — Inpatient Hospital Stay: Payer: Medicare HMO

## 2023-06-27 DIAGNOSIS — I10 Essential (primary) hypertension: Secondary | ICD-10-CM | POA: Diagnosis not present

## 2023-06-27 DIAGNOSIS — E119 Type 2 diabetes mellitus without complications: Secondary | ICD-10-CM | POA: Diagnosis not present

## 2023-06-27 DIAGNOSIS — R7989 Other specified abnormal findings of blood chemistry: Secondary | ICD-10-CM | POA: Diagnosis not present

## 2023-07-16 ENCOUNTER — Inpatient Hospital Stay: Payer: Medicare HMO | Attending: Hematology

## 2023-07-16 VITALS — BP 130/75 | HR 78 | Temp 98.0°F | Resp 20

## 2023-07-16 DIAGNOSIS — Z79899 Other long term (current) drug therapy: Secondary | ICD-10-CM | POA: Insufficient documentation

## 2023-07-16 DIAGNOSIS — E538 Deficiency of other specified B group vitamins: Secondary | ICD-10-CM

## 2023-07-16 DIAGNOSIS — D500 Iron deficiency anemia secondary to blood loss (chronic): Secondary | ICD-10-CM

## 2023-07-16 MED ORDER — CYANOCOBALAMIN 1000 MCG/ML IJ SOLN
1000.0000 ug | Freq: Once | INTRAMUSCULAR | Status: AC
  Administered 2023-07-16: 1000 ug via INTRAMUSCULAR
  Filled 2023-07-16: qty 1

## 2023-07-16 NOTE — Progress Notes (Signed)
Patient tolerated injection with no complaints voiced. Site clean and dry with no bruising or swelling noted at site. See MAR for details. Band aid applied.  Patient stable during and after injection. VSS with discharge and left in satisfactory condition with no s/s of distress noted.  

## 2023-07-16 NOTE — Patient Instructions (Signed)
[facility] [place] - [facility]  Discharge Instructions: Thank you for choosing [facility] to provide your oncology and hematology care.  If you have a lab appointment with the Cancer Center - please note that after April 8th, 2024, all labs will be drawn in the cancer center.  You do not have to check in or register with the main entrance as you have in the past but will complete your check-in in the cancer center.  Wear comfortable clothing and clothing appropriate for easy access to any Portacath or PICC line.   We strive to give you quality time with your provider. You may need to reschedule your appointment if you arrive late (15 or more minutes).  Arriving late affects you and other patients whose appointments are after yours.  Also, if you miss three or more appointments without notifying the office, you may be dismissed from the clinic at the provider's discretion.      For prescription refill requests, have your pharmacy contact our office and allow 72 hours for refills to be completed.    Today you received the following B12 injection, return as scheduled.   To help prevent nausea and vomiting after your treatment, we encourage you to take your nausea medication as directed.  BELOW ARE SYMPTOMS THAT SHOULD BE REPORTED IMMEDIATELY: *FEVER GREATER THAN 100.4 F (38 C) OR HIGHER *CHILLS OR SWEATING *NAUSEA AND VOMITING THAT IS NOT CONTROLLED WITH YOUR NAUSEA MEDICATION *UNUSUAL SHORTNESS OF BREATH *UNUSUAL BRUISING OR BLEEDING *URINARY PROBLEMS (pain or burning when urinating, or frequent urination) *BOWEL PROBLEMS (unusual diarrhea, constipation, pain near the anus) TENDERNESS IN MOUTH AND THROAT WITH OR WITHOUT PRESENCE OF ULCERS (sore throat, sores in mouth, or a toothache) UNUSUAL RASH, SWELLING OR PAIN  UNUSUAL VAGINAL DISCHARGE OR ITCHING   Items with * indicate a potential emergency and should be followed up as soon as  possible or go to the Emergency Department if any problems should occur.  Please show the CHEMOTHERAPY ALERT CARD or IMMUNOTHERAPY ALERT CARD at check-in to the Emergency Department and triage nurse.  Should you have questions after your visit or need to cancel or reschedule your appointment, please contact [facility] [place] - A DEPT OF [facility] [phone]  and follow the prompts.  Office hours are 8:00 a.m. to 4:30 p.m. Monday - Friday. Please note that voicemails left after 4:00 p.m. may not be returned until the following business day.  We are closed weekends and major holidays. You have access to a nurse at all times for urgent questions. Please call the main number to the clinic [phone] and follow the prompts.  For any non-urgent questions, you may also contact your provider using MyChart. We now offer e-Visits for anyone 69 and older to request care online for non-urgent symptoms. For details visit [email or web address].   Also download the MyChart app! Go to the app store, search "MyChart", open the app, select [place], and log in with your MyChart username and password.

## 2023-07-19 DIAGNOSIS — R7989 Other specified abnormal findings of blood chemistry: Secondary | ICD-10-CM | POA: Diagnosis not present

## 2023-07-19 DIAGNOSIS — I10 Essential (primary) hypertension: Secondary | ICD-10-CM | POA: Diagnosis not present

## 2023-07-19 DIAGNOSIS — E119 Type 2 diabetes mellitus without complications: Secondary | ICD-10-CM | POA: Diagnosis not present

## 2023-08-05 ENCOUNTER — Other Ambulatory Visit: Payer: Self-pay

## 2023-08-05 DIAGNOSIS — E559 Vitamin D deficiency, unspecified: Secondary | ICD-10-CM

## 2023-08-05 DIAGNOSIS — D500 Iron deficiency anemia secondary to blood loss (chronic): Secondary | ICD-10-CM

## 2023-08-05 DIAGNOSIS — E538 Deficiency of other specified B group vitamins: Secondary | ICD-10-CM

## 2023-08-06 ENCOUNTER — Inpatient Hospital Stay: Payer: Medicare HMO | Attending: Hematology

## 2023-08-06 ENCOUNTER — Inpatient Hospital Stay: Payer: Medicare HMO

## 2023-08-06 VITALS — BP 140/80 | HR 72 | Temp 97.7°F | Resp 18

## 2023-08-06 DIAGNOSIS — G4700 Insomnia, unspecified: Secondary | ICD-10-CM | POA: Diagnosis not present

## 2023-08-06 DIAGNOSIS — R197 Diarrhea, unspecified: Secondary | ICD-10-CM | POA: Diagnosis not present

## 2023-08-06 DIAGNOSIS — K5909 Other constipation: Secondary | ICD-10-CM | POA: Insufficient documentation

## 2023-08-06 DIAGNOSIS — R0602 Shortness of breath: Secondary | ICD-10-CM | POA: Diagnosis not present

## 2023-08-06 DIAGNOSIS — D500 Iron deficiency anemia secondary to blood loss (chronic): Secondary | ICD-10-CM

## 2023-08-06 DIAGNOSIS — E559 Vitamin D deficiency, unspecified: Secondary | ICD-10-CM | POA: Diagnosis not present

## 2023-08-06 DIAGNOSIS — Z79899 Other long term (current) drug therapy: Secondary | ICD-10-CM | POA: Insufficient documentation

## 2023-08-06 DIAGNOSIS — R079 Chest pain, unspecified: Secondary | ICD-10-CM | POA: Diagnosis not present

## 2023-08-06 DIAGNOSIS — E538 Deficiency of other specified B group vitamins: Secondary | ICD-10-CM

## 2023-08-06 DIAGNOSIS — R5383 Other fatigue: Secondary | ICD-10-CM | POA: Insufficient documentation

## 2023-08-06 LAB — CBC WITH DIFFERENTIAL/PLATELET
Abs Immature Granulocytes: 0.02 10*3/uL (ref 0.00–0.07)
Basophils Absolute: 0 10*3/uL (ref 0.0–0.1)
Basophils Relative: 1 %
Eosinophils Absolute: 0.1 10*3/uL (ref 0.0–0.5)
Eosinophils Relative: 1 %
HCT: 45.5 % (ref 36.0–46.0)
Hemoglobin: 14.8 g/dL (ref 12.0–15.0)
Immature Granulocytes: 0 %
Lymphocytes Relative: 26 %
Lymphs Abs: 1.6 10*3/uL (ref 0.7–4.0)
MCH: 29.1 pg (ref 26.0–34.0)
MCHC: 32.5 g/dL (ref 30.0–36.0)
MCV: 89.6 fL (ref 80.0–100.0)
Monocytes Absolute: 0.5 10*3/uL (ref 0.1–1.0)
Monocytes Relative: 8 %
Neutro Abs: 3.9 10*3/uL (ref 1.7–7.7)
Neutrophils Relative %: 64 %
Platelets: 193 10*3/uL (ref 150–400)
RBC: 5.08 MIL/uL (ref 3.87–5.11)
RDW: 12.7 % (ref 11.5–15.5)
WBC: 6 10*3/uL (ref 4.0–10.5)
nRBC: 0 % (ref 0.0–0.2)

## 2023-08-06 LAB — IRON AND TIBC
Iron: 89 ug/dL (ref 28–170)
Saturation Ratios: 31 % (ref 10.4–31.8)
TIBC: 287 ug/dL (ref 250–450)
UIBC: 198 ug/dL

## 2023-08-06 LAB — VITAMIN D 25 HYDROXY (VIT D DEFICIENCY, FRACTURES): Vit D, 25-Hydroxy: 42.77 ng/mL (ref 30–100)

## 2023-08-06 LAB — VITAMIN B12: Vitamin B-12: 304 pg/mL (ref 180–914)

## 2023-08-06 LAB — FERRITIN: Ferritin: 212 ng/mL (ref 11–307)

## 2023-08-06 MED ORDER — CYANOCOBALAMIN 1000 MCG/ML IJ SOLN
1000.0000 ug | Freq: Once | INTRAMUSCULAR | Status: AC
  Administered 2023-08-06: 1000 ug via INTRAMUSCULAR
  Filled 2023-08-06: qty 1

## 2023-08-06 NOTE — Progress Notes (Signed)
[name] presents today for injection per the provider's orders.  B12 administration without incident; injection site WNL; see MAR for injection details.  Patient tolerated procedure well and without incident.  No questions or complaints noted at this time. Discharged from clinic ambulatory in stable condition. Alert and oriented x 3. F/U with [facility] as scheduled.

## 2023-08-08 ENCOUNTER — Inpatient Hospital Stay (HOSPITAL_BASED_OUTPATIENT_CLINIC_OR_DEPARTMENT_OTHER): Payer: Medicare HMO | Admitting: Oncology

## 2023-08-08 DIAGNOSIS — E559 Vitamin D deficiency, unspecified: Secondary | ICD-10-CM

## 2023-08-08 DIAGNOSIS — D500 Iron deficiency anemia secondary to blood loss (chronic): Secondary | ICD-10-CM | POA: Diagnosis not present

## 2023-08-08 DIAGNOSIS — E538 Deficiency of other specified B group vitamins: Secondary | ICD-10-CM

## 2023-08-08 LAB — METHYLMALONIC ACID, SERUM: Methylmalonic Acid, Quantitative: 182 nmol/L (ref 0–378)

## 2023-08-08 NOTE — Progress Notes (Signed)
 Virtual Visit via Telephone Note  I connected with [name] on 08/08/23 at  2:00 PM EST by telephone and verified that I am speaking with the correct person using two identifiers.  Location: Patient: Home Provider:Clinic    I discussed the limitations, risks, security and privacy concerns of performing an evaluation and management service by telephone and the availability of in person appointments. I also discussed with the patient that there may be a patient responsible charge related to this service. The patient expressed understanding and agreed to proceed.  History of Present Illness: [name] is a 73 year old female with past medical history significant for iron  deficiency anemia and vitamin B12 deficiency who was last evaluated on 04/17/2023.  She has been receiving monthly B12 injections last given on 08/06/2023 and intermittent IV iron  last given on 05/09/2023 and 05/16/23.   In the interim, she denies any hospitalizations, surgeries or changes to her baseline health.  She was evaluated by cardiology on 05/02/2023 by Dr. Alvan for chest pain and shortness of breath.  She had an echocardiogram on 06/11/2023 which revealed an EF of 60 to 65%.  She is here to review most recent lab results.  Reports an appetite is 75% energy levels of 50%.  Denies any pain.  Has chronic constipation rotating with diarrhea.  Has trouble sleeping.  Reports feeling slower than usual.  Feels slightly more sluggish over the past 3 months.  Reports feeling significantly better following iron  infusions.  Reports she likes to keep her ferritin greater than 150 and likes to receive approximately every 3 months.  Reports if she waits too long, her symptoms progressed rapidly downhill.   Observations/Objective: Review of Systems  Constitutional:  Positive for malaise/fatigue.  Gastrointestinal:  Positive for constipation and diarrhea.  Psychiatric/Behavioral:  The patient has insomnia.     Physical  Exam Neurological:     Mental Status: She is alert and oriented to person, place, and time.    Assessment and Plan: 1. Iron  deficiency anemia due to chronic blood loss (Primary) -Etiology felt to be secondary to blood loss.  She currently denies any melena, hematochezia or obvious blood loss. -IV Venofer  400 mg last given on 05/09/2023 and 05/16/2023. -Unable to tolerate oral iron . -Likes to keep her ferritin greater than 150 per recommendations of previous hematologist and history. -Labs from 08/06/2023 show iron  saturation 31%, ferritin 212, hemoglobin 14.8 with MCV 89.6.  Differential is normal. -Patient is interested in additional iron  although levels are adequate.  We discussed potentially pushing out her iron  to every 4 months versus every 3 and she is agreeable.  Recommend 2 additional doses of IV iron  in 1 month. -Return to clinic in months for lab work and follow-up.  2. B12 deficiency -Receives monthly B12 injections last given on 08/06/2023. -B12 level 304 with normal MMA on 08/06/2023. -Recommend she continue monthly B12 injections.  3. Vitamin D  deficiency -Vitamin D  level 42.77. -Continue calcium and vitamin D  supplements.  Follow Up Instructions: PLAN SUMMARY: >> 2 additional IV iron  in one month.  >> Continue B12 injection monthly X 6 >> RTC in 4 months from iron  infusion with labs a few days before     I discussed the assessment and treatment plan with the patient. The patient was provided an opportunity to ask questions and all were answered. The patient agreed with the plan and demonstrated an understanding of the instructions.   The patient was advised to call back or seek an in-person evaluation if  the symptoms worsen or if the condition fails to improve as anticipated.  I provided 22 minutes of non-face-to-face time during this encounter.   Delon FORBES Hope, NP

## 2023-08-12 ENCOUNTER — Other Ambulatory Visit: Payer: Self-pay | Admitting: Physician Assistant

## 2023-08-12 DIAGNOSIS — E559 Vitamin D deficiency, unspecified: Secondary | ICD-10-CM

## 2023-08-22 ENCOUNTER — Inpatient Hospital Stay: Payer: Medicare HMO

## 2023-08-22 VITALS — BP 139/73 | HR 66 | Temp 97.4°F | Resp 18

## 2023-08-22 DIAGNOSIS — D500 Iron deficiency anemia secondary to blood loss (chronic): Secondary | ICD-10-CM

## 2023-08-22 DIAGNOSIS — G4700 Insomnia, unspecified: Secondary | ICD-10-CM | POA: Diagnosis not present

## 2023-08-22 DIAGNOSIS — R5383 Other fatigue: Secondary | ICD-10-CM | POA: Diagnosis not present

## 2023-08-22 DIAGNOSIS — Z79899 Other long term (current) drug therapy: Secondary | ICD-10-CM | POA: Diagnosis not present

## 2023-08-22 DIAGNOSIS — K5909 Other constipation: Secondary | ICD-10-CM | POA: Diagnosis not present

## 2023-08-22 DIAGNOSIS — R0602 Shortness of breath: Secondary | ICD-10-CM | POA: Diagnosis not present

## 2023-08-22 DIAGNOSIS — R197 Diarrhea, unspecified: Secondary | ICD-10-CM | POA: Diagnosis not present

## 2023-08-22 DIAGNOSIS — E538 Deficiency of other specified B group vitamins: Secondary | ICD-10-CM

## 2023-08-22 DIAGNOSIS — E559 Vitamin D deficiency, unspecified: Secondary | ICD-10-CM | POA: Diagnosis not present

## 2023-08-22 DIAGNOSIS — R079 Chest pain, unspecified: Secondary | ICD-10-CM | POA: Diagnosis not present

## 2023-08-22 MED ORDER — SODIUM CHLORIDE 0.9 % IV SOLN: Freq: Once | INTRAVENOUS | Status: AC

## 2023-08-22 MED ORDER — SODIUM CHLORIDE 0.9 % IV SOLN
400.0000 mg | Freq: Once | INTRAVENOUS | Status: AC
  Administered 2023-08-22: 400 mg via INTRAVENOUS
  Filled 2023-08-22: qty 400

## 2023-08-22 NOTE — Progress Notes (Signed)
Patient presents today for iron infusion.  Patient is in satisfactory condition with no new complaints voiced.  Vital signs are stable.  We will proceed with infusion per provider orders.     Patient took pr-meds Claritin and Tylenol at home prior to arrival. Peripheral IV started with good blood return pre and post infusion.  Discharged from clinic ambulatory in stable condition. Alert and oriented x 3. F/U with [facility] as scheduled.

## 2023-08-27 ENCOUNTER — Inpatient Hospital Stay: Payer: Medicare HMO

## 2023-08-29 ENCOUNTER — Inpatient Hospital Stay: Payer: Medicare HMO

## 2023-08-29 VITALS — BP 140/73 | HR 66 | Temp 97.8°F | Resp 16

## 2023-08-29 DIAGNOSIS — E538 Deficiency of other specified B group vitamins: Secondary | ICD-10-CM

## 2023-08-29 DIAGNOSIS — R197 Diarrhea, unspecified: Secondary | ICD-10-CM | POA: Diagnosis not present

## 2023-08-29 DIAGNOSIS — G4700 Insomnia, unspecified: Secondary | ICD-10-CM | POA: Diagnosis not present

## 2023-08-29 DIAGNOSIS — D500 Iron deficiency anemia secondary to blood loss (chronic): Secondary | ICD-10-CM | POA: Diagnosis not present

## 2023-08-29 DIAGNOSIS — Z79899 Other long term (current) drug therapy: Secondary | ICD-10-CM | POA: Diagnosis not present

## 2023-08-29 DIAGNOSIS — R0602 Shortness of breath: Secondary | ICD-10-CM | POA: Diagnosis not present

## 2023-08-29 DIAGNOSIS — R5383 Other fatigue: Secondary | ICD-10-CM | POA: Diagnosis not present

## 2023-08-29 DIAGNOSIS — K5909 Other constipation: Secondary | ICD-10-CM | POA: Diagnosis not present

## 2023-08-29 DIAGNOSIS — R079 Chest pain, unspecified: Secondary | ICD-10-CM | POA: Diagnosis not present

## 2023-08-29 DIAGNOSIS — E559 Vitamin D deficiency, unspecified: Secondary | ICD-10-CM | POA: Diagnosis not present

## 2023-08-29 MED ORDER — CYANOCOBALAMIN 1000 MCG/ML IJ SOLN
1000.0000 ug | Freq: Once | INTRAMUSCULAR | Status: AC
  Administered 2023-08-29: 1000 ug via INTRAMUSCULAR
  Filled 2023-08-29: qty 1

## 2023-08-29 MED ORDER — IRON SUCROSE 20 MG/ML IV SOLN
400.0000 mg | Freq: Once | INTRAVENOUS | Status: AC
  Administered 2023-08-29: 400 mg via INTRAVENOUS
  Filled 2023-08-29: qty 400

## 2023-08-29 MED ORDER — SODIUM CHLORIDE 0.9 % IV SOLN: Freq: Once | INTRAVENOUS | Status: AC

## 2023-08-29 NOTE — Progress Notes (Signed)
Patient presents today for iron infusion.  Patient is in satisfactory condition with no new complaints voiced.  Vital signs are stable.  We will proceed with infusion per provider orders.    Patient took pre-med Tylenol and Zyrtec at home prior to arrival. Peripheral IV started with good blood return pre and post infusion.  Venofer 400 mg  given today per MD orders. Tolerated infusion without adverse affects. Vital signs stable. No complaints at this time. Discharged from clinic ambulatory in stable condition. Alert and oriented x 3. F/U with [facility] as scheduled.

## 2023-08-29 NOTE — Progress Notes (Signed)
[name] presents today for injection per the provider's orders.  B12 administration without incident; injection site WNL; see MAR for injection details.  Patient tolerated procedure well and without incident.  No questions or complaints noted at this time. Discharged from clinic ambulatory in stable condition. Alert and oriented x 3. F/U with [facility] as scheduled.

## 2023-08-29 NOTE — Patient Instructions (Signed)
[facility] [place] [facility]  Discharge Instructions: Thank you for choosing [facility] to provide your oncology and hematology care.  If you have a lab appointment with the Cancer Center - please note that after April 8th, 2024, all labs will be drawn in the cancer center.  You do not have to check in or register with the main entrance as you have in the past but will complete your check-in in the cancer center.  Wear comfortable clothing and clothing appropriate for easy access to any Portacath or PICC line.   We strive to give you quality time with your provider. You may need to reschedule your appointment if you arrive late (15 or more minutes).  Arriving late affects you and other patients whose appointments are after yours.  Also, if you miss three or more appointments without notifying the office, you may be dismissed from the clinic at the provider's discretion.      For prescription refill requests, have your pharmacy contact our office and allow 72 hours for refills to be completed.    Today you received Venofer 400 mg IV iron infusion and B12 injection.     BELOW ARE SYMPTOMS THAT SHOULD BE REPORTED IMMEDIATELY: *FEVER GREATER THAN 100.4 F (38 C) OR HIGHER *CHILLS OR SWEATING *NAUSEA AND VOMITING THAT IS NOT CONTROLLED WITH YOUR NAUSEA MEDICATION *UNUSUAL SHORTNESS OF BREATH *UNUSUAL BRUISING OR BLEEDING *URINARY PROBLEMS (pain or burning when urinating, or frequent urination) *BOWEL PROBLEMS (unusual diarrhea, constipation, pain near the anus) TENDERNESS IN MOUTH AND THROAT WITH OR WITHOUT PRESENCE OF ULCERS (sore throat, sores in mouth, or a toothache) UNUSUAL RASH, SWELLING OR PAIN  UNUSUAL VAGINAL DISCHARGE OR ITCHING   Items with * indicate a potential emergency and should be followed up as soon as possible or go to the Emergency Department if any problems should occur.  Please show the CHEMOTHERAPY ALERT CARD or  IMMUNOTHERAPY ALERT CARD at check-in to the Emergency Department and triage nurse.  Should you have questions after your visit or need to cancel or reschedule your appointment, please contact [facility] [place] - A DEPT OF [facility] [phone]  and follow the prompts.  Office hours are 8:00 a.m. to 4:30 p.m. Monday - Friday. Please note that voicemails left after 4:00 p.m. may not be returned until the following business day.  We are closed weekends and major holidays. You have access to a nurse at all times for urgent questions. Please call the main number to the clinic [phone] and follow the prompts.  For any non-urgent questions, you may also contact your provider using MyChart. We now offer e-Visits for anyone 45 and older to request care online for non-urgent symptoms. For details visit [email or web address].   Also download the MyChart app! Go to the app store, search "MyChart", open the app, select [place], and log in with your MyChart username and password.

## 2023-09-26 ENCOUNTER — Inpatient Hospital Stay: Payer: Medicare HMO | Attending: Hematology

## 2023-09-26 VITALS — BP 146/79 | HR 62 | Temp 98.8°F | Resp 20

## 2023-09-26 DIAGNOSIS — E538 Deficiency of other specified B group vitamins: Secondary | ICD-10-CM | POA: Diagnosis not present

## 2023-09-26 DIAGNOSIS — D500 Iron deficiency anemia secondary to blood loss (chronic): Secondary | ICD-10-CM

## 2023-09-26 MED ORDER — CYANOCOBALAMIN 1000 MCG/ML IJ SOLN
1000.0000 ug | Freq: Once | INTRAMUSCULAR | Status: AC
Start: 2023-09-26 — End: 2023-09-26
  Administered 2023-09-26: 1000 ug via INTRAMUSCULAR
  Filled 2023-09-26: qty 1

## 2023-09-26 NOTE — Progress Notes (Signed)
 Patient tolerated Vitamin B 12 injection with no complaints voiced.  Site clean and dry with no bruising or swelling noted.  No complaints of pain.  Discharged with vital signs stable and no signs or symptoms of distress noted.   ?

## 2023-09-26 NOTE — Patient Instructions (Signed)
 [facility] [place] - [facility]  Discharge Instructions: Thank you for choosing [facility] to provide your oncology and hematology care.  If you have a lab appointment with the Cancer Center - please note that after April 8th, 2024, all labs will be drawn in the cancer center.  You do not have to check in or register with the main entrance as you have in the past but will complete your check-in in the cancer center.  Wear comfortable clothing and clothing appropriate for easy access to any Portacath or PICC line.   We strive to give you quality time with your provider. You may need to reschedule your appointment if you arrive late (15 or more minutes).  Arriving late affects you and other patients whose appointments are after yours.  Also, if you miss three or more appointments without notifying the office, you may be dismissed from the clinic at the provider's discretion.      For prescription refill requests, have your pharmacy contact our office and allow 72 hours for refills to be completed.    Today you received the following: Vitamin B12   Vitamin B12 Injection What is this medication? Vitamin B12 (VAHY tuh min B12) prevents and treats low vitamin B12 levels in your body. It is used in people who do not get enough vitamin B12 from their diet or when their digestive tract does not absorb enough. Vitamin B12 plays an important role in maintaining the health of your nervous system and red blood cells. This medicine may be used for other purposes; ask your health care provider or pharmacist if you have questions. COMMON BRAND NAME(S): B-12 Compliance Kit, B-12 Injection Kit, Cyomin, Dodex, LA-12, Nutri-Twelve, Physicians EZ Use B-12, Primabalt, Vitamin Deficiency Injectable System - B12 What should I tell my care team before I take this medication? They need to know if you have any of these conditions: Kidney disease Leber's  disease Megaloblastic anemia An unusual or allergic reaction to cyanocobalamin, cobalt, other medications, foods, dyes, or preservatives Pregnant or trying to get pregnant Breast-feeding How should I use this medication? This medication is injected into a muscle or deeply under the skin. It is usually given in a clinic or care team's office. However, your care team may teach you how to inject yourself. Follow all instructions. Talk to your care team about the use of this medication in children. Special care may be needed. Overdosage: If you think you have taken too much of this medicine contact a poison control center or emergency room at once. NOTE: This medicine is only for you. Do not share this medicine with others. What if I miss a dose? If you are given your dose at a clinic or care team's office, call to reschedule your appointment. If you give your own injections, and you miss a dose, take it as soon as you can. If it is almost time for your next dose, take only that dose. Do not take double or extra doses. What may interact with this medication? Alcohol Colchicine This list may not describe all possible interactions. Give your health care provider a list of all the medicines, herbs, non-prescription drugs, or dietary supplements you use. Also tell them if you smoke, drink alcohol, or use illegal drugs. Some items may interact with your medicine. What should I watch for while using this medication? Visit your care team regularly. You may need blood work done while you are  taking this medication. You may need to follow a special diet. Talk to your care team. Limit your alcohol intake and avoid smoking to get the best benefit. What side effects may I notice from receiving this medication? Side effects that you should report to your care team as soon as possible: Allergic reactions--skin rash, itching, hives, swelling of the face, lips, tongue, or throat Swelling of the ankles, hands, or  feet Trouble breathing Side effects that usually do not require medical attention (report to your care team if they continue or are bothersome): Diarrhea This list may not describe all possible side effects. Call your doctor for medical advice about side effects. You may report side effects to FDA at 1-800-FDA-1088. Where should I keep my medication? Keep out of the reach of children. Store at room temperature between 15 and 30 degrees C (59 and 85 degrees F). Protect from light. Throw away any unused medication after the expiration date. NOTE: This sheet is a summary. It may not cover all possible information. If you have questions about this medicine, talk to your doctor, pharmacist, or health care provider.  2024 Elsevier/Gold Standard (2021-03-27 00:00:00)    To help prevent nausea and vomiting after your treatment, we encourage you to take your nausea medication as directed.  BELOW ARE SYMPTOMS THAT SHOULD BE REPORTED IMMEDIATELY: *FEVER GREATER THAN 100.4 F (38 C) OR HIGHER *CHILLS OR SWEATING *NAUSEA AND VOMITING THAT IS NOT CONTROLLED WITH YOUR NAUSEA MEDICATION *UNUSUAL SHORTNESS OF BREATH *UNUSUAL BRUISING OR BLEEDING *URINARY PROBLEMS (pain or burning when urinating, or frequent urination) *BOWEL PROBLEMS (unusual diarrhea, constipation, pain near the anus) TENDERNESS IN MOUTH AND THROAT WITH OR WITHOUT PRESENCE OF ULCERS (sore throat, sores in mouth, or a toothache) UNUSUAL RASH, SWELLING OR PAIN  UNUSUAL VAGINAL DISCHARGE OR ITCHING   Items with * indicate a potential emergency and should be followed up as soon as possible or go to the Emergency Department if any problems should occur.  Please show the CHEMOTHERAPY ALERT CARD or IMMUNOTHERAPY ALERT CARD at check-in to the Emergency Department and triage nurse.  Should you have questions after your visit or need to cancel or reschedule your appointment, please contact [facility] [place] - A DEPT OF [facility] [phone]  and follow the prompts.  Office hours are 8:00 a.m. to 4:30 p.m. Monday - Friday. Please note that voicemails left after 4:00 p.m. may not be returned until the following business day.  We are closed weekends and major holidays. You have access to a nurse at all times for urgent questions. Please call the main number to the clinic [phone] and follow the prompts.  For any non-urgent questions, you may also contact your provider using MyChart. We now offer e-Visits for anyone 58 and older to request care online for non-urgent symptoms. For details visit [email or web address].   Also download the MyChart app! Go to the app store, search "MyChart", open the app, select [place], and log in with your MyChart username and password.

## 2023-10-23 ENCOUNTER — Inpatient Hospital Stay: Attending: Hematology

## 2023-10-23 VITALS — BP 141/84 | HR 75 | Temp 99.0°F | Resp 20

## 2023-10-23 DIAGNOSIS — E538 Deficiency of other specified B group vitamins: Secondary | ICD-10-CM | POA: Diagnosis not present

## 2023-10-23 DIAGNOSIS — D500 Iron deficiency anemia secondary to blood loss (chronic): Secondary | ICD-10-CM

## 2023-10-23 MED ORDER — CYANOCOBALAMIN 1000 MCG/ML IJ SOLN
1000.0000 ug | Freq: Once | INTRAMUSCULAR | Status: AC
  Administered 2023-10-23: 1000 ug via INTRAMUSCULAR
  Filled 2023-10-23: qty 1

## 2023-10-23 NOTE — Patient Instructions (Signed)
 [facility] [place] [facility]  Discharge Instructions: Thank you for choosing [facility] to provide your oncology and hematology care.  If you have a lab appointment with the Cancer Center - please note that after April 8th, 2024, all labs will be drawn in the cancer center.  You do not have to check in or register with the main entrance as you have in the past but will complete your check-in in the cancer center.  Wear comfortable clothing and clothing appropriate for easy access to any Portacath or PICC line.   We strive to give you quality time with your provider. You may need to reschedule your appointment if you arrive late (15 or more minutes).  Arriving late affects you and other patients whose appointments are after yours.  Also, if you miss three or more appointments without notifying the office, you may be dismissed from the clinic at the provider's discretion.      For prescription refill requests, have your pharmacy contact our office and allow 72 hours for refills to be completed.    Today you received the following chemotherapy and/or immunotherapy agents b12      To help prevent nausea and vomiting after your treatment, we encourage you to take your nausea medication as directed.  BELOW ARE SYMPTOMS THAT SHOULD BE REPORTED IMMEDIATELY: *FEVER GREATER THAN 100.4 F (38 C) OR HIGHER *CHILLS OR SWEATING *NAUSEA AND VOMITING THAT IS NOT CONTROLLED WITH YOUR NAUSEA MEDICATION *UNUSUAL SHORTNESS OF BREATH *UNUSUAL BRUISING OR BLEEDING *URINARY PROBLEMS (pain or burning when urinating, or frequent urination) *BOWEL PROBLEMS (unusual diarrhea, constipation, pain near the anus) TENDERNESS IN MOUTH AND THROAT WITH OR WITHOUT PRESENCE OF ULCERS (sore throat, sores in mouth, or a toothache) UNUSUAL RASH, SWELLING OR PAIN  UNUSUAL VAGINAL DISCHARGE OR ITCHING   Items with * indicate a potential emergency and should be followed up as  soon as possible or go to the Emergency Department if any problems should occur.  Please show the CHEMOTHERAPY ALERT CARD or IMMUNOTHERAPY ALERT CARD at check-in to the Emergency Department and triage nurse.  Should you have questions after your visit or need to cancel or reschedule your appointment, please contact [facility]CER CTR [place] - A DEPT OF [facility] [phone]  and follow the prompts.  Office hours are 8:00 a.m. to 4:30 p.m. Monday - Friday. Please note that voicemails left after 4:00 p.m. may not be returned until the following business day.  We are closed weekends and major holidays. You have access to a nurse at all times for urgent questions. Please call the main number to the clinic [phone] and follow the prompts.  For any non-urgent questions, you may also contact your provider using MyChart. We now offer e-Visits for anyone 5 and older to request care online for non-urgent symptoms. For details visit [email or web address].   Also download the MyChart app! Go to the app store, search "MyChart", open the app, select [place], and log in with your MyChart username and password.

## 2023-10-23 NOTE — Progress Notes (Signed)
Patient presents today for B12 injection per providers order.  Stable during administration without incident; injection site WNL; see MAR for injection details.  Patient tolerated procedure well and without incident.  No questions or complaints noted at this time.  

## 2023-10-24 ENCOUNTER — Inpatient Hospital Stay: Payer: Medicare HMO

## 2023-11-05 ENCOUNTER — Ambulatory Visit: Payer: Medicare HMO | Attending: Cardiology | Admitting: Cardiology

## 2023-11-05 ENCOUNTER — Encounter: Payer: Self-pay | Admitting: Cardiology

## 2023-11-05 VITALS — BP 122/60 | HR 74 | Ht 64.0 in | Wt 231.4 lb

## 2023-11-05 DIAGNOSIS — R0602 Shortness of breath: Secondary | ICD-10-CM | POA: Diagnosis not present

## 2023-11-05 DIAGNOSIS — Z13220 Encounter for screening for lipoid disorders: Secondary | ICD-10-CM

## 2023-11-05 DIAGNOSIS — R0789 Other chest pain: Secondary | ICD-10-CM | POA: Diagnosis not present

## 2023-11-05 NOTE — Patient Instructions (Signed)
 Medication Instructions:  Your physician recommends that you continue on your current medications as directed. Please refer to the Current Medication list given to you today.  *If you need a refill on your cardiac medications before your next appointment, please call your pharmacy*  Lab Work: Fasting Lipid Panel If you have labs (blood work) drawn today and your tests are completely normal, you will receive your results only by: MyChart Message (if you have MyChart) OR A paper copy in the mail If you have any lab test that is abnormal or we need to change your treatment, we will call you to review the results.  Testing/Procedures: None  Follow-Up: At [facility], you and your health needs are our priority.  As part of our continuing mission to provide you with exceptional heart care, our providers are all part of one team.  This team includes your primary Cardiologist (physician) and Advanced Practice Providers or APPs (Physician Assistants and Nurse Practitioners) who all work together to provide you with the care you need, when you need it.  Your next appointment:   6 month(s)  Provider:   You may see Dina Rich, MD or one of the following Advanced Practice Providers on your designated Care Team:   Randall An, PA-C  [place], [place] Jacolyn Reedy, [place]     We recommend signing up for the patient portal called "MyChart".  Sign up information is provided on this After Visit Summary.  MyChart is used to connect with patients for Virtual Visits (Telemedicine).  Patients are able to view lab/test results, encounter notes, upcoming appointments, etc.  Non-urgent messages can be sent to your provider as well.   To learn more about what you can do with MyChart, go to [email or web address].   Other Instructions

## 2023-11-05 NOTE — Progress Notes (Signed)
 Clinical Summary Ms. Panebianco is a 73 y.o.female seen today for follow up of the following medical problems.    1.Chest pain/SOB - ER visit 06/27/21 with chest pain - Trop neg x 2. EKG NSR, no ischemic changes   - at initiial visit reported heaviness midchest. Could occur at rest or with activity. Some nausea associated. 7/10 in severity. Not positional. Constant for several days. Sometimes worst with eating.  - does a lot of walking at her job, mild DOE with activities   CAD risk factors: father with MIs 60s she believes   - some chest discomfort at times occasionally with eating, or laying down.    - SOB ongoing for a few months. DOE with walking form parking lot into church - occasional LE edema. Occasoinal SOB with laying flat.  - some cough, mild wheeze at times. No smoking history.    - pt was 211 lbs in  07/2021, she is 231 lbs today      05/2023 echo: LVEF 65-70%, no WMAs, grade I dd - chronic SOB - no recent chest pains - remote tobacco history in her late 8s <5 years.    2. Fe deficient anemia - gets IV iron regularly.  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 Works lunch shift   Past Medical History:  Diagnosis Date   Arthritis    B12 deficiency 07/09/2015   Crohn disease (HCC) JULY 2011 FEDA   PERSISTENT TI ULCERS seen on CE, NO NSAID USE   Endometriosis    HISTORY  LEADING TO HYSTERECTOMY   Fibromyalgia    Fibromyalgia 12/09/2013   Gastric ulcer    GERD (gastroesophageal reflux disease)    Headache    Helicobacter pylori gastritis    HISTORY   History of hiatal hernia    Hives    Iron (Fe) deficiency anemia 2009   JAN 2012 NL HB & FERRITIN   Obesity    Osteoporosis 11/20/2014   Plantar fasciitis      Allergies  Allergen Reactions   Cefzil [Cefprozil] Rash   Celebrex [Celecoxib] Hives and Itching   Sulfonamide Derivatives Itching     Current Outpatient Medications  Medication Sig Dispense Refill    acetaminophen (TYLENOL) 325 MG tablet Take 650 mg by mouth as needed for moderate pain or headache.     pantoprazole (PROTONIX) 40 MG tablet Take 1 tablet (40 mg total) by mouth daily. 30 tablet 3   Vitamin D, Ergocalciferol, (DRISDOL) 1.25 MG (50000 UNIT) CAPS capsule TAKE 1 CAPSULE BY MOUTH ONE TIME PER WEEK 12 capsule 4   No current facility-administered medications for this visit.   Facility-Administered Medications Ordered in Other Visits  Medication Dose Route Frequency Provider Last Rate Last Admin   0.9 %  sodium chloride infusion   Intravenous Continuous Doreatha Massed, MD   Paused at 11/17/18 0932   cyanocobalamin (VITAMIN B12) 1000 MCG/ML injection              Past Surgical History:  Procedure Laterality Date   ABDOMINAL HYSTERECTOMY     CATARACT EXTRACTION W/PHACO Right 12/05/2015   Procedure: CATARACT EXTRACTION PHACO AND INTRAOCULAR LENS PLACEMENT (IOC);  Surgeon: Galen Manila, MD;  Location: [facility] ORS;  Service: Ophthalmology;  Laterality: Right;  [place] 00:59.9 AP% 21.9 CDE 13.10 fluid pack lot # [id number]   CATARACT EXTRACTION W/PHACO Left 06/24/2016   Procedure: CATARACT EXTRACTION PHACO AND INTRAOCULAR LENS PLACEMENT LEFT EYE; CDE:  8.33;  Surgeon: Gemma Payor, MD;  Location: [facility];  Service: Ophthalmology;  Laterality: Left;   COLONOSCOPY     ESOPHAGOGASTRODUODENOSCOPY  10/14/2007   Esophagus showed 2 cm pink tongue seen extending from the GE junction.  Biopsies obtained via cold forceps.  Otherwise the  esophagus was without erosions, mass, ulceration, or stricture / . Large hiatal hernia pouch with linear erosions, and two 3-6 mmulcers seen in the pouch.  Biopsies obtained via cold forceps to  evaluate for H. pylori gastritis or malignancy   Ileocolonoscopy  10/14/2007   Multiple 1-3 mm terminal ileum ulcers biopsied  The ulcers seemed to only involve the last 5 cm of her terminal ileum, and 10 cm of her ileum was visualized.  Otherwise the colon  was without  polyps, masses, diverticula, or AVMs.  She had a normal retroflexed view of the rectum   OVARIAN CYST REMOVAL     TOTAL ABDOMINAL HYSTERECTOMY W/ BILATERAL SALPINGOOPHORECTOMY     UPPER GASTROINTESTINAL ENDOSCOPY  JULY 2011 DIARRHEA/FEDA    Bx-H.PYLORI GASTRITIS, NL DUODENUM     Allergies  Allergen Reactions   Cefzil [Cefprozil] Rash   Celebrex [Celecoxib] Hives and Itching   Sulfonamide Derivatives Itching      Family History  Problem Relation Age of Onset   Diabetes Mother    Cancer Father    Diabetes Father    Diabetes Sister    Diabetes Brother    Colon polyps Neg Hx    Colon cancer Neg Hx    Stroke Neg Hx      Social History Ms. Baysinger reports that she has never smoked. She has never used smokeless tobacco. Ms. Talamante reports no history of alcohol use.      Physical Examination Today's Vitals   11/05/23 1038  BP: 122/60  Pulse: 74  SpO2: 96%  Weight: 231 lb 6.4 oz (105 kg)  Height: 5\' 4"  (1.626 m)  PainSc: 0-No pain   Body mass index is 39.72 kg/m.  Gen: resting comfortably, no acute distress HEENT: no scleral icterus, pupils equal round and reactive, no palptable cervical adenopathy,  CV: RRR, no m/rg, no jvd Resp: Clear to auscultation bilaterally GI: abdomen is soft, non-tender, non-distended, normal bowel sounds, no hepatosplenomegaly MSK: extremities are warm, no edema.  Skin: warm, no rash Neuro:  no focal deficits Psych: appropriate affect   Diagnostic Studies  05/2023 echo 1. Left ventricular ejection fraction, by estimation, is 65 to 70%. The  left ventricle has normal function. The left ventricle has no regional  wall motion abnormalities. There is mild asymmetric left ventricular  hypertrophy of the septal segment. Left  ventricular diastolic parameters are consistent with Grade I diastolic  dysfunction (impaired relaxation).   2. Right ventricular systolic function is normal. The right ventricular  size is normal. Tricuspid  regurgitation signal is inadequate for assessing  PA pressure.   3. The mitral valve is grossly normal. No evidence of mitral valve  regurgitation.   4. The aortic valve is tricuspid. Aortic valve regurgitation is not  visualized.   5. The inferior vena cava is dilated in size with >50% respiratory  variability, suggesting right atrial pressure of 8 mmHg.      Assessment and Plan  1.Chest pain/SOB -chest pains have resolved.  - chronic SOB overall stable.  - recent benign echo - suspect related to deconditioning, has gained 20 lbs over 2 years - follow symptoms with increased exercise, don't see strong indication for ischemic testing at this time.  Due for lipid testing, will order.   F/u 6 months   Antoine Poche, M.D.

## 2023-11-20 ENCOUNTER — Inpatient Hospital Stay: Attending: Hematology

## 2023-11-20 VITALS — BP 146/73 | HR 67 | Temp 97.6°F | Resp 18

## 2023-11-20 DIAGNOSIS — E538 Deficiency of other specified B group vitamins: Secondary | ICD-10-CM | POA: Diagnosis not present

## 2023-11-20 DIAGNOSIS — D500 Iron deficiency anemia secondary to blood loss (chronic): Secondary | ICD-10-CM

## 2023-11-20 MED ORDER — CYANOCOBALAMIN 1000 MCG/ML IJ SOLN
1000.0000 ug | Freq: Once | INTRAMUSCULAR | Status: AC
  Administered 2023-11-20: 1000 ug via INTRAMUSCULAR
  Filled 2023-11-20: qty 1

## 2023-11-20 NOTE — Progress Notes (Signed)
 B12 injection  given per orders. Patient tolerated it well without problems. Vitals stable and discharged home from clinic ambulatory. Follow up as scheduled.

## 2023-11-20 NOTE — Patient Instructions (Signed)
 [facility] [place] [facility]  Discharge Instructions: Thank you for choosing [facility] to provide your oncology and hematology care.  If you have a lab appointment with the Cancer Center - please note that after April 8th, 2024, all labs will be drawn in the cancer center.  You do not have to check in or register with the main entrance as you have in the past but will complete your check-in in the cancer center.  Wear comfortable clothing and clothing appropriate for easy access to any Portacath or PICC line.   We strive to give you quality time with your provider. You may need to reschedule your appointment if you arrive late (15 or more minutes).  Arriving late affects you and other patients whose appointments are after yours.  Also, if you miss three or more appointments without notifying the office, you may be dismissed from the clinic at the provider's discretion.      For prescription refill requests, have your pharmacy contact our office and allow 72 hours for refills to be completed.    Today you received the following injection: B12   To help prevent nausea and vomiting after your treatment, we encourage you to take your nausea medication as directed.  BELOW ARE SYMPTOMS THAT SHOULD BE REPORTED IMMEDIATELY: *FEVER GREATER THAN 100.4 F (38 C) OR HIGHER *CHILLS OR SWEATING *NAUSEA AND VOMITING THAT IS NOT CONTROLLED WITH YOUR NAUSEA MEDICATION *UNUSUAL SHORTNESS OF BREATH *UNUSUAL BRUISING OR BLEEDING *URINARY PROBLEMS (pain or burning when urinating, or frequent urination) *BOWEL PROBLEMS (unusual diarrhea, constipation, pain near the anus) TENDERNESS IN MOUTH AND THROAT WITH OR WITHOUT PRESENCE OF ULCERS (sore throat, sores in mouth, or a toothache) UNUSUAL RASH, SWELLING OR PAIN  UNUSUAL VAGINAL DISCHARGE OR ITCHING   Items with * indicate a potential emergency and should be followed up as soon as possible or go to the  Emergency Department if any problems should occur.  Please show the CHEMOTHERAPY ALERT CARD or IMMUNOTHERAPY ALERT CARD at check-in to the Emergency Department and triage nurse.  Should you have questions after your visit or need to cancel or reschedule your appointment, please contact [facility] [place] - A DEPT OF [facility] Bridegroom [facility] [phone]  and follow the prompts.  Office hours are 8:00 a.m. to 4:30 p.m. Monday - Friday. Please note that voicemails left after 4:00 p.m. may not be returned until the following business day.  We are closed weekends and major holidays. You have access to a nurse at all times for urgent questions. Please call the main number to the clinic [phone] and follow the prompts.  For any non-urgent questions, you may also contact your provider using MyChart. We now offer e-Visits for anyone 73 and older to request care online for non-urgent symptoms. For details visit [email or web address].   Also download the MyChart app! Go to the app store, search "MyChart", open the app, select [place], and log in with your MyChart username and password.

## 2023-11-24 ENCOUNTER — Inpatient Hospital Stay: Payer: Medicare HMO

## 2023-11-28 DIAGNOSIS — Z03818 Encounter for observation for suspected exposure to other biological agents ruled out: Secondary | ICD-10-CM | POA: Diagnosis not present

## 2023-11-28 DIAGNOSIS — J069 Acute upper respiratory infection, unspecified: Secondary | ICD-10-CM | POA: Diagnosis not present

## 2023-12-12 DIAGNOSIS — H33312 Horseshoe tear of retina without detachment, left eye: Secondary | ICD-10-CM | POA: Diagnosis not present

## 2023-12-18 ENCOUNTER — Other Ambulatory Visit: Payer: Self-pay

## 2023-12-18 DIAGNOSIS — D500 Iron deficiency anemia secondary to blood loss (chronic): Secondary | ICD-10-CM

## 2023-12-18 DIAGNOSIS — E559 Vitamin D deficiency, unspecified: Secondary | ICD-10-CM

## 2023-12-18 DIAGNOSIS — E538 Deficiency of other specified B group vitamins: Secondary | ICD-10-CM

## 2023-12-19 ENCOUNTER — Inpatient Hospital Stay: Payer: Medicare HMO | Attending: Hematology

## 2023-12-19 DIAGNOSIS — E559 Vitamin D deficiency, unspecified: Secondary | ICD-10-CM | POA: Insufficient documentation

## 2023-12-19 DIAGNOSIS — Z13220 Encounter for screening for lipoid disorders: Secondary | ICD-10-CM | POA: Diagnosis not present

## 2023-12-19 DIAGNOSIS — D500 Iron deficiency anemia secondary to blood loss (chronic): Secondary | ICD-10-CM

## 2023-12-19 DIAGNOSIS — E538 Deficiency of other specified B group vitamins: Secondary | ICD-10-CM | POA: Insufficient documentation

## 2023-12-19 DIAGNOSIS — R197 Diarrhea, unspecified: Secondary | ICD-10-CM | POA: Diagnosis not present

## 2023-12-19 DIAGNOSIS — R5383 Other fatigue: Secondary | ICD-10-CM | POA: Diagnosis not present

## 2023-12-19 DIAGNOSIS — K5909 Other constipation: Secondary | ICD-10-CM | POA: Insufficient documentation

## 2023-12-19 DIAGNOSIS — K5090 Crohn's disease, unspecified, without complications: Secondary | ICD-10-CM | POA: Insufficient documentation

## 2023-12-19 DIAGNOSIS — Z800 Family history of malignant neoplasm of digestive organs: Secondary | ICD-10-CM | POA: Diagnosis not present

## 2023-12-19 DIAGNOSIS — D509 Iron deficiency anemia, unspecified: Secondary | ICD-10-CM | POA: Insufficient documentation

## 2023-12-19 LAB — CBC WITH DIFFERENTIAL/PLATELET
Abs Immature Granulocytes: 0.02 10*3/uL (ref 0.00–0.07)
Basophils Absolute: 0.1 10*3/uL (ref 0.0–0.1)
Basophils Relative: 1 %
Eosinophils Absolute: 0.1 10*3/uL (ref 0.0–0.5)
Eosinophils Relative: 2 %
HCT: 43.8 % (ref 36.0–46.0)
Hemoglobin: 14.3 g/dL (ref 12.0–15.0)
Immature Granulocytes: 0 %
Lymphocytes Relative: 29 %
Lymphs Abs: 1.6 10*3/uL (ref 0.7–4.0)
MCH: 29.7 pg (ref 26.0–34.0)
MCHC: 32.6 g/dL (ref 30.0–36.0)
MCV: 91.1 fL (ref 80.0–100.0)
Monocytes Absolute: 0.5 10*3/uL (ref 0.1–1.0)
Monocytes Relative: 9 %
Neutro Abs: 3.3 10*3/uL (ref 1.7–7.7)
Neutrophils Relative %: 59 %
Platelets: 183 10*3/uL (ref 150–400)
RBC: 4.81 MIL/uL (ref 3.87–5.11)
RDW: 12.7 % (ref 11.5–15.5)
WBC: 5.6 10*3/uL (ref 4.0–10.5)
nRBC: 0 % (ref 0.0–0.2)

## 2023-12-19 LAB — LIPID PANEL
Cholesterol: 184 mg/dL (ref 0–200)
HDL: 41 mg/dL (ref >40)
LDL Cholesterol: 125 mg/dL — ABNORMAL HIGH (ref 0–99)
Total CHOL/HDL Ratio: 4.5 ratio
Triglycerides: 89 mg/dL (ref <150)
VLDL: 18 mg/dL (ref 0–40)

## 2023-12-19 LAB — IRON AND TIBC
Iron: 94 ug/dL (ref 28–170)
Saturation Ratios: 33 % — ABNORMAL HIGH (ref 10.4–31.8)
TIBC: 284 ug/dL (ref 250–450)
UIBC: 190 ug/dL

## 2023-12-19 LAB — VITAMIN D 25 HYDROXY (VIT D DEFICIENCY, FRACTURES): Vit D, 25-Hydroxy: 73.16 ng/mL (ref 30–100)

## 2023-12-19 LAB — VITAMIN B12: Vitamin B-12: 282 pg/mL (ref 180–914)

## 2023-12-19 LAB — FERRITIN: Ferritin: 306 ng/mL (ref 11–307)

## 2023-12-24 LAB — METHYLMALONIC ACID, SERUM: Methylmalonic Acid, Quantitative: 198 nmol/L (ref 0–378)

## 2023-12-26 ENCOUNTER — Inpatient Hospital Stay: Payer: Medicare HMO | Admitting: Oncology

## 2023-12-26 ENCOUNTER — Inpatient Hospital Stay: Payer: Medicare HMO

## 2023-12-26 VITALS — BP 141/64 | HR 60 | Temp 97.7°F | Resp 18

## 2023-12-26 VITALS — Wt 228.2 lb

## 2023-12-26 DIAGNOSIS — E559 Vitamin D deficiency, unspecified: Secondary | ICD-10-CM | POA: Diagnosis not present

## 2023-12-26 DIAGNOSIS — E538 Deficiency of other specified B group vitamins: Secondary | ICD-10-CM

## 2023-12-26 DIAGNOSIS — D500 Iron deficiency anemia secondary to blood loss (chronic): Secondary | ICD-10-CM

## 2023-12-26 MED ORDER — CYANOCOBALAMIN 1000 MCG/ML IJ SOLN
1000.0000 ug | Freq: Once | INTRAMUSCULAR | Status: AC
  Administered 2023-12-26: 1000 ug via INTRAMUSCULAR
  Filled 2023-12-26: qty 1

## 2023-12-26 NOTE — Progress Notes (Signed)
 B12 injection  given per orders. Patient tolerated it well without problems. Vitals stable and discharged home from clinic ambulatory. Follow up as scheduled.

## 2023-12-26 NOTE — Progress Notes (Signed)
 [name] at [facility] [address], [place], [place] [address] Main: [phone] Fax: [phone]  Follow-up Visit    Patient Care Team: Sara Brasil, MD as PCP - General Branch, Sara No, MD as PCP - Cardiology (Cardiology)   Name of the patient: [name]  [id number]  12-03-1950   Date of visit: 12/26/2023   History of Present Illness: [name] is a 73 year old female with past medical history significant for iron  deficiency anemia and vitamin B12 deficiency who was last evaluated on 08/08/2023.  She has been receiving monthly B12 injections last given on 11/20/2023 and intermittent IV iron  last given on 08/22/2023 and 08/29/2023.   In the interim, she denies any hospitalizations, surgeries or changes to her baseline health.  She was evaluated by cardiology on 11/05/2023 by Dr. Amanda Hart for chest pain and shortness of breath.  She had an echocardiogram on 06/11/2023 which revealed an EF of 60 to 65%.  Shortness of breath felt to be secondary to deconditioning and recent weight gain.  She is here to review most recent lab results.  Reports an appetite is 75% energy levels of 25%.  Denies any pain.  Has chronic constipation rotating with diarrhea d/t history of Crohn's disease.  She gets a boost of energy following her iron  infusions that last approximately 1 month. Reports she likes to keep her ferritin greater than 150 and has been receiving iron  infusions approximately every 3 months.  Reports if she waits too long, her symptoms progressed rapidly downhill.  States her brother was recently diagnosed with pancreatic cancer.  She is very concerned about this.  She would like to continue B12 injections as she feels like they are significantly helping her energy levels.  States she is leaving at the end of June for a beach trip with her family and is hoping to get in a dose of iron  and/or B12 before then.     Observations/Objective: Review of Systems   Constitutional:  Positive for malaise/fatigue.  Respiratory:  Positive for cough.   Gastrointestinal:  Positive for constipation and diarrhea.  Neurological:  Positive for headaches.    Physical Exam Constitutional:      Appearance: Normal appearance. She is obese.  Cardiovascular:     Rate and Rhythm: Normal rate and regular rhythm.  Pulmonary:     Effort: Pulmonary effort is normal.     Breath sounds: Normal breath sounds.  Abdominal:     General: Bowel sounds are normal.     Palpations: Abdomen is soft.  Musculoskeletal:        General: Hart swelling. Normal range of motion.  Neurological:     Mental Status: She is alert and oriented to person, place, and time. Mental status is at baseline.    Assessment and Plan: 1. Iron  deficiency anemia due to chronic blood loss (Primary) -Etiology felt to be secondary to blood loss.  She currently denies any melena, hematochezia or obvious blood loss. -IV Venofer  400 mg last given on 08/22/2023 and 08/29/2023. -Unable to tolerate oral iron . -Likes to keep her ferritin greater than 150 per recommendations of previous hematologist and history. -Labs from 12/19/2023 show iron  saturation 33%, ferritin 306, hemoglobin 14.3 with MCV 91. Differential is normal. -Patient is interested in additional iron  although levels are adequate.  We discussed potentially pushing out her iron  to every 4-5 months versus every 3 and she is agreeable and decreasing Venofer  from 400 to 200 mg.  Recommend 1 additional  dose (200 mg) of IV iron  in 1 month prior to her beach trip. -Return to clinic in months for lab work and follow-up.  2. B12 deficiency -Receives monthly B12 injections last given on 11/20/23.  -B12 level 282 with normal MMA on 12/19/23. -Recommend she continue monthly B12 injections.  Will add in additional B12 injection in 2 weeks (before her beach trip) followed by monthly B12.  3. Vitamin D  deficiency -Vitamin D  level 73.16 -Continue calcium and  vitamin D  supplements.  Follow Up Instructions: PLAN SUMMARY: >> Return to clinic in mid June for 1 dose of 200 mg of IV Venofer . >> Continue B12 injection monthly X 6 and will add in an additional B12 in 2 weeks prior to her vacation. >> RTC in 4 months from iron  infusion with labs a few days before.     I discussed the assessment and treatment plan with the patient. The patient was provided an opportunity to ask questions and all were answered. The patient agreed with the plan and demonstrated an understanding of the instructions.   The patient was advised to call back or seek an in-person evaluation if the symptoms worsen or if the condition fails to improve as anticipated.  I spent 25 minutes dedicated to the care of this patient (face-to-face and non-face-to-face) on the date of the encounter to include what is described in the assessment and plan.    Aurther Blue, NP

## 2024-01-07 ENCOUNTER — Inpatient Hospital Stay: Attending: Hematology

## 2024-01-07 ENCOUNTER — Inpatient Hospital Stay

## 2024-01-07 VITALS — BP 126/65 | HR 64 | Temp 97.3°F | Resp 20

## 2024-01-07 DIAGNOSIS — E538 Deficiency of other specified B group vitamins: Secondary | ICD-10-CM | POA: Diagnosis not present

## 2024-01-07 DIAGNOSIS — D500 Iron deficiency anemia secondary to blood loss (chronic): Secondary | ICD-10-CM | POA: Diagnosis not present

## 2024-01-07 MED ORDER — CYANOCOBALAMIN 1000 MCG/ML IJ SOLN
1000.0000 ug | Freq: Once | INTRAMUSCULAR | Status: AC
  Administered 2024-01-07: 1000 ug via INTRAMUSCULAR
  Filled 2024-01-07: qty 1

## 2024-01-07 MED ORDER — SODIUM CHLORIDE 0.9 % IV SOLN: INTRAVENOUS | Status: DC

## 2024-01-07 MED ORDER — IRON SUCROSE 200 MG IVPB - SIMPLE MED
200.0000 mg | Freq: Once | Status: AC
  Administered 2024-01-07: 200 mg via INTRAVENOUS
  Filled 2024-01-07: qty 200

## 2024-01-07 NOTE — Progress Notes (Signed)
 Patient tolerated B12 injection with no complaints voiced.  Site clean and dry with no bruising or swelling noted at site.  See MAR for details.  Band aid applied.  Patient stable during and after injection.  Vss with discharge and left in satisfactory condition with no s/s of distress noted. All follow ups as scheduled.   [name] [employer]

## 2024-01-07 NOTE — Progress Notes (Signed)
 Patient tolerated iron infusion with no complaints voiced.  Peripheral IV site clean and dry with good blood return noted before and after infusion.  Band aid applied. Pt observed for 30 minutes post iron infusion without any complications.  VSS with discharge and left in satisfactory condition with no s/s of distress noted. All follow ups as scheduled.   [name] [employer]

## 2024-01-14 ENCOUNTER — Inpatient Hospital Stay

## 2024-01-14 VITALS — BP 115/84 | HR 80 | Temp 97.4°F | Resp 18

## 2024-01-14 DIAGNOSIS — D500 Iron deficiency anemia secondary to blood loss (chronic): Secondary | ICD-10-CM

## 2024-01-14 DIAGNOSIS — E538 Deficiency of other specified B group vitamins: Secondary | ICD-10-CM

## 2024-01-14 MED ORDER — IRON SUCROSE 200 MG IVPB - SIMPLE MED
200.0000 mg | Freq: Once | Status: AC
  Administered 2024-01-14: 200 mg via INTRAVENOUS
  Filled 2024-01-14: qty 200

## 2024-01-14 MED ORDER — SODIUM CHLORIDE 0.9 % IV SOLN: INTRAVENOUS | Status: DC

## 2024-01-14 NOTE — Progress Notes (Signed)
Patient presents today fro Venofer infusion per providers order.  Vital signs WNL.  Patient has no new complaints at this time.    Peripheral IV started and blood return noted pre and post infusion.  Stable during infusion without adverse affects.  Vital signs stable.  No complaints at this time.  Discharge from clinic ambulatory in stable condition.  Alert and oriented X 3.  Follow up with [facility] as scheduled.

## 2024-01-14 NOTE — Patient Instructions (Signed)

## 2024-02-04 ENCOUNTER — Inpatient Hospital Stay

## 2024-02-04 DIAGNOSIS — H5203 Hypermetropia, bilateral: Secondary | ICD-10-CM | POA: Diagnosis not present

## 2024-02-12 ENCOUNTER — Ambulatory Visit: Payer: Self-pay | Admitting: Cardiology

## 2024-03-03 ENCOUNTER — Inpatient Hospital Stay: Attending: Hematology

## 2024-03-03 VITALS — BP 139/77 | HR 87 | Temp 97.4°F | Resp 18

## 2024-03-03 DIAGNOSIS — E538 Deficiency of other specified B group vitamins: Secondary | ICD-10-CM | POA: Diagnosis not present

## 2024-03-03 DIAGNOSIS — D500 Iron deficiency anemia secondary to blood loss (chronic): Secondary | ICD-10-CM

## 2024-03-03 MED ORDER — CYANOCOBALAMIN 1000 MCG/ML IJ SOLN
1000.0000 ug | Freq: Once | INTRAMUSCULAR | Status: AC
  Administered 2024-03-03: 1000 ug via INTRAMUSCULAR
  Filled 2024-03-03: qty 1

## 2024-03-03 NOTE — Progress Notes (Signed)
 Patient tolerated injection with no complaints voiced.  Site clean and dry with no bruising or swelling noted at site.  See MAR for details.  Band aid applied.  Patient stable during and after injection.  Vss with discharge and left in satisfactory condition with no s/s of distress noted.

## 2024-03-03 NOTE — Patient Instructions (Signed)

## 2024-03-04 ENCOUNTER — Inpatient Hospital Stay: Attending: Hematology

## 2024-03-05 ENCOUNTER — Inpatient Hospital Stay

## 2024-04-02 ENCOUNTER — Inpatient Hospital Stay: Attending: Hematology

## 2024-04-02 DIAGNOSIS — R5383 Other fatigue: Secondary | ICD-10-CM | POA: Diagnosis not present

## 2024-04-02 DIAGNOSIS — R197 Diarrhea, unspecified: Secondary | ICD-10-CM | POA: Diagnosis not present

## 2024-04-02 DIAGNOSIS — E538 Deficiency of other specified B group vitamins: Secondary | ICD-10-CM | POA: Diagnosis not present

## 2024-04-02 DIAGNOSIS — R0602 Shortness of breath: Secondary | ICD-10-CM | POA: Diagnosis not present

## 2024-04-02 DIAGNOSIS — K449 Diaphragmatic hernia without obstruction or gangrene: Secondary | ICD-10-CM | POA: Insufficient documentation

## 2024-04-02 DIAGNOSIS — D500 Iron deficiency anemia secondary to blood loss (chronic): Secondary | ICD-10-CM | POA: Diagnosis not present

## 2024-04-02 DIAGNOSIS — Z79899 Other long term (current) drug therapy: Secondary | ICD-10-CM | POA: Diagnosis not present

## 2024-04-02 DIAGNOSIS — Z723 Lack of physical exercise: Secondary | ICD-10-CM | POA: Diagnosis not present

## 2024-04-02 DIAGNOSIS — M2550 Pain in unspecified joint: Secondary | ICD-10-CM | POA: Insufficient documentation

## 2024-04-02 DIAGNOSIS — E669 Obesity, unspecified: Secondary | ICD-10-CM | POA: Insufficient documentation

## 2024-04-02 DIAGNOSIS — K5900 Constipation, unspecified: Secondary | ICD-10-CM | POA: Diagnosis not present

## 2024-04-02 DIAGNOSIS — E559 Vitamin D deficiency, unspecified: Secondary | ICD-10-CM | POA: Insufficient documentation

## 2024-04-02 LAB — CBC WITH DIFFERENTIAL/PLATELET
Abs Immature Granulocytes: 0.01 K/uL (ref 0.00–0.07)
Basophils Absolute: 0.1 K/uL (ref 0.0–0.1)
Basophils Relative: 1 %
Eosinophils Absolute: 0.1 K/uL (ref 0.0–0.5)
Eosinophils Relative: 1 %
HCT: 43.1 % (ref 36.0–46.0)
Hemoglobin: 14.3 g/dL (ref 12.0–15.0)
Immature Granulocytes: 0 %
Lymphocytes Relative: 30 %
Lymphs Abs: 1.6 K/uL (ref 0.7–4.0)
MCH: 29.9 pg (ref 26.0–34.0)
MCHC: 33.2 g/dL (ref 30.0–36.0)
MCV: 90.2 fL (ref 80.0–100.0)
Monocytes Absolute: 0.6 K/uL (ref 0.1–1.0)
Monocytes Relative: 11 %
Neutro Abs: 3 K/uL (ref 1.7–7.7)
Neutrophils Relative %: 57 %
Platelets: 185 K/uL (ref 150–400)
RBC: 4.78 MIL/uL (ref 3.87–5.11)
RDW: 12.1 % (ref 11.5–15.5)
WBC: 5.4 K/uL (ref 4.0–10.5)
nRBC: 0 % (ref 0.0–0.2)

## 2024-04-02 LAB — IRON AND TIBC
Iron: 101 ug/dL (ref 28–170)
Saturation Ratios: 40 % — ABNORMAL HIGH (ref 10.4–31.8)
TIBC: 255 ug/dL (ref 250–450)
UIBC: 154 ug/dL

## 2024-04-02 LAB — VITAMIN D 25 HYDROXY (VIT D DEFICIENCY, FRACTURES): Vit D, 25-Hydroxy: 58.87 ng/mL (ref 30–100)

## 2024-04-02 LAB — VITAMIN B12: Vitamin B-12: 241 pg/mL (ref 180–914)

## 2024-04-02 LAB — FERRITIN: Ferritin: 325 ng/mL — ABNORMAL HIGH (ref 11–307)

## 2024-04-06 LAB — METHYLMALONIC ACID, SERUM: Methylmalonic Acid, Quantitative: 179 nmol/L (ref 0–378)

## 2024-04-07 DIAGNOSIS — B078 Other viral warts: Secondary | ICD-10-CM | POA: Diagnosis not present

## 2024-04-07 DIAGNOSIS — L820 Inflamed seborrheic keratosis: Secondary | ICD-10-CM | POA: Diagnosis not present

## 2024-04-09 ENCOUNTER — Inpatient Hospital Stay: Admitting: Oncology

## 2024-04-09 ENCOUNTER — Inpatient Hospital Stay

## 2024-04-09 VITALS — BP 138/87 | HR 72 | Temp 97.7°F | Resp 19 | Wt 226.6 lb

## 2024-04-09 DIAGNOSIS — Z79899 Other long term (current) drug therapy: Secondary | ICD-10-CM | POA: Diagnosis not present

## 2024-04-09 DIAGNOSIS — E669 Obesity, unspecified: Secondary | ICD-10-CM | POA: Diagnosis not present

## 2024-04-09 DIAGNOSIS — E538 Deficiency of other specified B group vitamins: Secondary | ICD-10-CM

## 2024-04-09 DIAGNOSIS — K5900 Constipation, unspecified: Secondary | ICD-10-CM | POA: Diagnosis not present

## 2024-04-09 DIAGNOSIS — R5383 Other fatigue: Secondary | ICD-10-CM | POA: Diagnosis not present

## 2024-04-09 DIAGNOSIS — K449 Diaphragmatic hernia without obstruction or gangrene: Secondary | ICD-10-CM | POA: Diagnosis not present

## 2024-04-09 DIAGNOSIS — D500 Iron deficiency anemia secondary to blood loss (chronic): Secondary | ICD-10-CM

## 2024-04-09 DIAGNOSIS — R0602 Shortness of breath: Secondary | ICD-10-CM | POA: Diagnosis not present

## 2024-04-09 DIAGNOSIS — E559 Vitamin D deficiency, unspecified: Secondary | ICD-10-CM | POA: Diagnosis not present

## 2024-04-09 DIAGNOSIS — D509 Iron deficiency anemia, unspecified: Secondary | ICD-10-CM

## 2024-04-09 DIAGNOSIS — Z723 Lack of physical exercise: Secondary | ICD-10-CM | POA: Diagnosis not present

## 2024-04-09 DIAGNOSIS — M2550 Pain in unspecified joint: Secondary | ICD-10-CM | POA: Diagnosis not present

## 2024-04-09 DIAGNOSIS — R197 Diarrhea, unspecified: Secondary | ICD-10-CM | POA: Diagnosis not present

## 2024-04-09 MED ORDER — CYANOCOBALAMIN 1000 MCG/ML IJ SOLN
1000.0000 ug | Freq: Once | INTRAMUSCULAR | Status: AC
  Administered 2024-04-09: 1000 ug via INTRAMUSCULAR
  Filled 2024-04-09: qty 1

## 2024-04-09 NOTE — Progress Notes (Signed)
 [name] OFFICE PROGRESS NOTE  Alphonsa Glendia LABOR, MD  ASSESSMENT & PLAN:    Assessment & Plan Iron  deficiency anemia due to chronic blood loss -Etiology felt to be secondary to blood loss.  She currently denies any melena, hematochezia or obvious blood loss. -IV Venofer  200 mg x 2 doses on 01/07/2024 and 01/14/2024. -Unable to tolerate oral iron . -Likes to keep her ferritin greater than 150 per recommendations of previous hematologist and history. -Labs from 04/02/2024 show iron  saturations 40% with a ferritin of 325. -We discussed no additional IV iron  needed at this time. -Recommend rechecking in 3 months.  B12 deficiency -Receives monthly B12 injections last given on 03/03/2024 -B12 level remains relatively low despite B12 injections.  -Recommend she increase B12 to twice per month for the next 2 months and then may return to monthly. -Recheck B12 levels in 3 months.  Vitamin D  deficiency -Vitamin D  level 58.87. -Continue calcium and vitamin D  supplements.    Orders Placed This Encounter  Procedures   Vitamin D  25 hydroxy    Standing Status:   Future    Expected Date:   10/07/2024    Expiration Date:   01/05/2025   Methylmalonic acid, serum    Standing Status:   Future    Expected Date:   10/07/2024    Expiration Date:   01/05/2025   CBC with Differential    Standing Status:   Future    Expected Date:   10/07/2024    Expiration Date:   01/05/2025   Vitamin B12    Standing Status:   Future    Expected Date:   10/07/2024    Expiration Date:   01/05/2025   Ferritin    Standing Status:   Future    Expected Date:   10/07/2024    Expiration Date:   01/05/2025   Iron  and TIBC ([facility] ONLY)    Standing Status:   Future    Expected Date:   10/07/2024    Expiration Date:   01/05/2025    INTERVAL HISTORY: Patient returns for follow-up.   [name] is a 73 year old female with past medical history significant for iron  deficiency anemia and  vitamin B12 deficiency.   She has been receiving monthly B12 injections and is due for 1 today.   In the interim, she denies any hospitalizations, surgeries or changes to her baseline health.   Reports overall doing well since her last visit.  Appetite is 100% energy levels are 70%.  She denies any pain.  Has rotating constipation and diarrhea.  Reports weight gain over the past few months secondary to poor eating and lack of exercise.  She has attempted to get out and walk around but has developed some shortness of breath and joint pain.  Reports she knows that she has a hiatal hernia and she is going to get this checked by GI in the next little bit to see if any intervention is needed.  She feels like this may be contributing some to her shortness of breath.  She denies any bleeding, melena or hematochezia.  She continues to work 3 days/week at [employer].  We reviewed CBC, vitamin B12, MMA, vitamin D , ferritin and iron  panel.  SUMMARY OF HEMATOLOGIC HISTORY: Oncology History   No history exists.     CBC    Component Value Date/Time   WBC 5.4 04/02/2024 1027   RBC 4.78 04/02/2024 1027   HGB 14.3 04/02/2024 1027   HGB  13.0 05/08/2022 1153   HCT 43.1 04/02/2024 1027   HCT 39.6 05/08/2022 1153   PLT 185 04/02/2024 1027   PLT 241 05/08/2022 1153   MCV 90.2 04/02/2024 1027   MCV 88 05/08/2022 1153   MCH 29.9 04/02/2024 1027   MCHC 33.2 04/02/2024 1027   RDW 12.1 04/02/2024 1027   RDW 13.1 05/08/2022 1153   LYMPHSABS 1.6 04/02/2024 1027   LYMPHSABS 1.5 05/08/2022 1153   MONOABS 0.6 04/02/2024 1027   EOSABS 0.1 04/02/2024 1027   EOSABS 0.1 05/08/2022 1153   BASOSABS 0.1 04/02/2024 1027   BASOSABS 0.1 05/08/2022 1153       Latest Ref Rng & Units 08/16/2022    2:31 PM 05/08/2022   11:53 AM 05/01/2022    9:29 AM  CMP  Glucose 70 - 99 mg/dL 874  99  91   BUN 8 - 23 mg/dL 15  14  16    Creatinine 0.44 - 1.00 mg/dL 9.26  9.38  9.39   Sodium 135 - 145 mmol/L 134  141  140    Potassium 3.5 - 5.1 mmol/L 4.0  4.3  4.3   Chloride 98 - 111 mmol/L 103  106  105   CO2 22 - 32 mmol/L 22  21  23    Calcium 8.9 - 10.3 mg/dL 8.7  9.2  9.4   Total Protein 6.5 - 8.1 g/dL 7.9  7.0  6.8   Total Bilirubin 0.3 - 1.2 mg/dL 0.5  0.3  0.3   Alkaline Phos 38 - 126 U/L 94  112  113   AST 15 - 41 U/L 33  17  12   ALT 0 - 44 U/L 25  14  15       Lab Results  Component Value Date   FERRITIN 325 (H) 04/02/2024   VITAMINB12 241 04/02/2024    There were no vitals filed for this visit.  Review of System:  Review of Systems  Constitutional:  Positive for malaise/fatigue.  Respiratory:  Positive for shortness of breath.   Gastrointestinal:  Positive for constipation and diarrhea.  Musculoskeletal:  Positive for joint pain.    Physical Exam: Physical Exam Constitutional:      Appearance: Normal appearance. She is obese.  HENT:     Head: Normocephalic and atraumatic.  Eyes:     Pupils: Pupils are equal, round, and reactive to light.  Cardiovascular:     Rate and Rhythm: Normal rate and regular rhythm.     Heart sounds: Normal heart sounds. No murmur heard. Pulmonary:     Effort: Pulmonary effort is normal.     Breath sounds: Normal breath sounds. No wheezing.  Abdominal:     General: Bowel sounds are normal. There is no distension.     Palpations: Abdomen is soft.     Tenderness: There is no abdominal tenderness.  Musculoskeletal:        General: Normal range of motion.     Cervical back: Normal range of motion.  Skin:    General: Skin is warm and dry.     Findings: No rash.  Neurological:     Mental Status: She is alert and oriented to person, place, and time.     Gait: Gait is intact.  Psychiatric:        Mood and Affect: Mood and affect normal.        Cognition and Memory: Memory normal.        Judgment: Judgment normal.  I spent 25 minutes dedicated to the care of this patient (face-to-face and non-face-to-face) on the date of the encounter to  include what is described in the assessment and plan.,  Delon Hope, NP 04/09/2024 8:23 AM

## 2024-04-09 NOTE — Assessment & Plan Note (Addendum)
-  Vitamin D  level 58.87. -Continue calcium and vitamin D  supplements.

## 2024-04-09 NOTE — Assessment & Plan Note (Addendum)
-  Receives monthly B12 injections last given on 03/03/2024 -B12 level remains relatively low despite B12 injections.  -Recommend she increase B12 to twice per month for the next 2 months and then may return to monthly. -Recheck B12 levels in 3 months.

## 2024-04-09 NOTE — Patient Instructions (Signed)
 Vitamin B12 Injection What is this medication? Vitamin B12 (VAHY tuh min B12) prevents and treats low vitamin B12 levels in your body. It is used in people who do not get enough vitamin B12 from their diet or when their digestive tract does not absorb enough. Vitamin B12 plays an important role in maintaining the health of your nervous system and red blood cells. This medicine may be used for other purposes; ask your health care provider or pharmacist if you have questions. COMMON BRAND NAME(S): B-12 Compliance Kit, B-12 Injection Kit, Cyomin, Dodex , LA-12, Nutri-Twelve, Physicians EZ Use B-12, Primabalt, Vitamin Deficiency Injectable System - B12 What should I tell my care team before I take this medication? They need to know if you have any of these conditions: Kidney disease Leber's disease Megaloblastic anemia An unusual or allergic reaction to cyanocobalamin , cobalt, other medications, foods, dyes, or preservatives Pregnant or trying to get pregnant Breast-feeding How should I use this medication? This medication is injected into a muscle or deeply under the skin. It is usually given in a clinic or care team's office. However, your care team may teach you how to inject yourself. Follow all instructions. Talk to your care team about the use of this medication in children. Special care may be needed. Overdosage: If you think you have taken too much of this medicine contact a poison control center or emergency room at once. NOTE: This medicine is only for you. Do not share this medicine with others. What if I miss a dose? If you are given your dose at a clinic or care team's office, call to reschedule your appointment. If you give your own injections, and you miss a dose, take it as soon as you can. If it is almost time for your next dose, take only that dose. Do not take double or extra doses. What may interact with this medication? Alcohol Colchicine This list may not describe all possible  interactions. Give your health care provider a list of all the medicines, herbs, non-prescription drugs, or dietary supplements you use. Also tell them if you smoke, drink alcohol, or use illegal drugs. Some items may interact with your medicine. What should I watch for while using this medication? Visit your care team regularly. You may need blood work done while you are taking this medication. You may need to follow a special diet. Talk to your care team. Limit your alcohol intake and avoid smoking to get the best benefit. What side effects may I notice from receiving this medication? Side effects that you should report to your care team as soon as possible: Allergic reactions--skin rash, itching, hives, swelling of the face, lips, tongue, or throat Swelling of the ankles, hands, or feet Trouble breathing Side effects that usually do not require medical attention (report to your care team if they continue or are bothersome): Diarrhea This list may not describe all possible side effects. Call your doctor for medical advice about side effects. You may report side effects to FDA at 1-800-FDA-1088. Where should I keep my medication? Keep out of the reach of children. Store at room temperature between 15 and 30 degrees C (59 and 85 degrees F). Protect from light. Throw away any unused medication after the expiration date. NOTE: This sheet is a summary. It may not cover all possible information. If you have questions about this medicine, talk to your doctor, pharmacist, or health care provider.  2024 Elsevier/Gold Standard (2021-03-27 00:00:00)

## 2024-04-09 NOTE — Progress Notes (Signed)
 Patient tolerated B12 injection with no complaints voiced.  Site clean and dry with no bruising or swelling noted at site.  See MAR for details.  Band aid applied.  Patient stable during and after injection.  Vss with discharge and left in satisfactory condition with no s/s of distress noted. All follow ups as scheduled.   [name]

## 2024-04-09 NOTE — Assessment & Plan Note (Addendum)
-  Etiology felt to be secondary to blood loss.  She currently denies any melena, hematochezia or obvious blood loss. -IV Venofer  200 mg x 2 doses on 01/07/2024 and 01/14/2024. -Unable to tolerate oral iron . -Likes to keep her ferritin greater than 150 per recommendations of previous hematologist and history. -Labs from 04/02/2024 show iron  saturations 40% with a ferritin of 325. -We discussed no additional IV iron  needed at this time. -Recommend rechecking in 3 months.

## 2024-04-21 ENCOUNTER — Encounter: Payer: Self-pay | Admitting: Cardiology

## 2024-04-21 ENCOUNTER — Ambulatory Visit: Attending: Cardiology | Admitting: Cardiology

## 2024-04-21 VITALS — BP 126/78 | HR 74 | Ht 64.0 in | Wt 228.0 lb

## 2024-04-21 DIAGNOSIS — Z8249 Family history of ischemic heart disease and other diseases of the circulatory system: Secondary | ICD-10-CM

## 2024-04-21 DIAGNOSIS — Z87898 Personal history of other specified conditions: Secondary | ICD-10-CM | POA: Diagnosis not present

## 2024-04-21 DIAGNOSIS — R0602 Shortness of breath: Secondary | ICD-10-CM

## 2024-04-21 NOTE — Progress Notes (Signed)
 Clinical Summary Ms. Riggio is a 73 y.o.female seen today for follow up of the following medical problems.    1.Chest pain/SOB - ER visit 06/27/21 with chest pain - Trop neg x 2. EKG NSR, no ischemic changes   - at initiial visit reported heaviness midchest. Could occur at rest or with activity. Some nausea associated. 7/10 in severity. Not positional. Constant for several days. Sometimes worst with eating.  - does a lot of walking at her job, mild DOE with activities   CAD risk factors: father with MIs 60s she believes   05/2023 echo: LVEF 65-70%, no WMAs, grade I dd - chronic SOB overall unchanged - exertion limited by arthritis in her legs.  - no recent chest pains - remote tobacco history in her late 26s <5 years.      2. Fe deficient anemia - gets IV iron  regularly.  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 Works lunch shift Past Medical History:  Diagnosis Date   Arthritis    B12 deficiency 07/09/2015   Crohn disease (HCC) JULY 2011 FEDA   PERSISTENT TI ULCERS seen on CE, NO NSAID USE   Endometriosis    HISTORY  LEADING TO HYSTERECTOMY   Fibromyalgia    Fibromyalgia 12/09/2013   Gastric ulcer    GERD (gastroesophageal reflux disease)    Headache    Helicobacter pylori gastritis    HISTORY   History of hiatal hernia    Hives    Iron  (Fe) deficiency anemia 2009   JAN 2012 NL HB & FERRITIN   Obesity    Osteoporosis 11/20/2014   Plantar fasciitis      Allergies  Allergen Reactions   Cefzil  [Cefprozil ] Rash   Celebrex [Celecoxib] Hives and Itching   Sulfonamide Derivatives Itching     Current Outpatient Medications  Medication Sig Dispense Refill   acetaminophen  (TYLENOL ) 325 MG tablet Take 650 mg by mouth as needed for moderate pain or headache.     Vitamin D , Ergocalciferol , (DRISDOL ) 1.25 MG (50000 UNIT) CAPS capsule TAKE 1 CAPSULE BY MOUTH ONE TIME PER WEEK 12 capsule 4   ketoconazole (NIZORAL) 2 % cream SMARTSIG:1  Application Topical 1 to 2 Times Daily (Patient not taking: Reported on 04/21/2024)     pantoprazole  (PROTONIX ) 40 MG tablet Take 1 tablet (40 mg total) by mouth daily. (Patient not taking: Reported on 04/21/2024) 30 tablet 3   No current facility-administered medications for this visit.   Facility-Administered Medications Ordered in Other Visits  Medication Dose Route Frequency Provider Last Rate Last Admin   0.9 %  sodium chloride  infusion   Intravenous Continuous Rogers Hai, MD   Paused at 11/17/18 0932   cyanocobalamin  (VITAMIN B12) 1000 MCG/ML injection              Past Surgical History:  Procedure Laterality Date   ABDOMINAL HYSTERECTOMY     CATARACT EXTRACTION W/PHACO Right 12/05/2015   Procedure: CATARACT EXTRACTION PHACO AND INTRAOCULAR LENS PLACEMENT (IOC);  Surgeon: Elsie Carmine, MD;  Location: [facility] ORS;  Service: Ophthalmology;  Laterality: Right;  US  00:59.9 AP% 21.9 CDE 13.10 fluid pack lot # [id number]   CATARACT EXTRACTION W/PHACO Left 06/24/2016   Procedure: CATARACT EXTRACTION PHACO AND INTRAOCULAR LENS PLACEMENT LEFT EYE; CDE:  8.33;  Surgeon: Cherene Mania, MD;  Location: [facility];  Service: Ophthalmology;  Laterality: Left;   COLONOSCOPY     ESOPHAGOGASTRODUODENOSCOPY  10/14/2007   Esophagus showed 2  cm pink tongue seen extending from the GE junction.  Biopsies obtained via cold forceps.  Otherwise the  esophagus was without erosions, mass, ulceration, or stricture / . Large hiatal hernia pouch with linear erosions, and two 3-6 mmulcers seen in the pouch.  Biopsies obtained via cold forceps to  evaluate for H. pylori gastritis or malignancy   Ileocolonoscopy  10/14/2007   Multiple 1-3 mm terminal ileum ulcers biopsied  The ulcers seemed to only involve the last 5 cm of her terminal ileum, and 10 cm of her ileum was visualized.  Otherwise the colon  was without polyps, masses, diverticula, or AVMs.  She had a normal retroflexed view of the rectum   OVARIAN CYST  REMOVAL     TOTAL ABDOMINAL HYSTERECTOMY W/ BILATERAL SALPINGOOPHORECTOMY     UPPER GASTROINTESTINAL ENDOSCOPY  JULY 2011 DIARRHEA/FEDA    Bx-H.PYLORI GASTRITIS, NL DUODENUM     Allergies  Allergen Reactions   Cefzil  [Cefprozil ] Rash   Celebrex [Celecoxib] Hives and Itching   Sulfonamide Derivatives Itching      Family History  Problem Relation Age of Onset   Diabetes Mother    Cancer Father    Diabetes Father    Diabetes Sister    Diabetes Brother    Colon polyps Neg Hx    Colon cancer Neg Hx    Stroke Neg Hx      Social History Ms. Dissinger reports that she has never smoked. She has never used smokeless tobacco. Ms. Yoon reports no history of alcohol use.     Physical Examination Today's Vitals   04/21/24 1014 04/21/24 1040  BP: (!) 141/91 126/78  Pulse: 74   SpO2: 97%   Weight: 228 lb (103.4 kg)   Height: 5' 4 (1.626 m)    Body mass index is 39.14 kg/m.  Gen: resting comfortably, no acute distress HEENT: no scleral icterus, pupils equal round and reactive, no palptable cervical adenopathy,  CV: RRR, no m/rg, no jvd Resp: Clear to auscultation bilaterally GI: abdomen is soft, non-tender, non-distended, normal bowel sounds, no hepatosplenomegaly MSK: extremities are warm, no edema.  Skin: warm, no rash Neuro:  no focal deficits Psych: appropriate affect   Diagnostic Studies  05/2023 echo 1. Left ventricular ejection fraction, by estimation, is 65 to 70%. The  left ventricle has normal function. The left ventricle has no regional  wall motion abnormalities. There is mild asymmetric left ventricular  hypertrophy of the septal segment. Left  ventricular diastolic parameters are consistent with Grade I diastolic  dysfunction (impaired relaxation).   2. Right ventricular systolic function is normal. The right ventricular  size is normal. Tricuspid regurgitation signal is inadequate for assessing  PA pressure.   3. The mitral valve is grossly normal.  No evidence of mitral valve  regurgitation.   4. The aortic valve is tricuspid. Aortic valve regurgitation is not  visualized.   5. The inferior vena cava is dilated in size with >50% respiratory  variability, suggesting right atrial pressure of 8 mmHg.    Assessment and Plan  1.Chest pain/SOB -chest pains have resolved.  - ongoing SOB/DOE of unclear etiology - echo was benign - with progressing cough over last few months check CXR, will order PFTs - EKG today NSR, no ischemic changes    Dorn PHEBE Ross, M.D.

## 2024-04-21 NOTE — Patient Instructions (Signed)
 Medication Instructions:  Your physician recommends that you continue on your current medications as directed. Please refer to the Current Medication list given to you today.  *If you need a refill on your cardiac medications before your next appointment, please call your pharmacy*  Lab Work: None If you have labs (blood work) drawn today and your tests are completely normal, you will receive your results only by: MyChart Message (if you have MyChart) OR A paper copy in the mail If you have any lab test that is abnormal or we need to change your treatment, we will call you to review the results.  Testing/Procedures: Your physician has recommended that you have a pulmonary function test. Pulmonary Function Tests are a group of tests that measure how well air moves in and out of your lungs.  Chest X-ray   Follow-Up: At [facility], you and your health needs are our priority.  As part of our continuing mission to provide you with exceptional heart care, our providers are all part of one team.  This team includes your primary Cardiologist (physician) and Advanced Practice Providers or APPs (Physician Assistants and Nurse Practitioners) who all work together to provide you with the care you need, when you need it.  Your next appointment:   6 month(s)  Provider:   You may see Alvan Carrier, MD or one of the following Advanced Practice Providers on your designated Care Team:   Laymon Qua, PA-C  [place], [place] Olivia [place]avy, [place]     We recommend signing up for the patient portal called MyChart.  Sign up information is provided on this After Visit Summary.  MyChart is used to connect with patients for Virtual Visits (Telemedicine).  Patients are able to view lab/test results, encounter notes, upcoming appointments, etc.  Non-urgent messages can be sent to your provider as well.   To learn more about what you can do with MyChart, go to [email or web address].    Other Instructions

## 2024-04-23 ENCOUNTER — Ambulatory Visit (HOSPITAL_COMMUNITY)
Admission: RE | Admit: 2024-04-23 | Discharge: 2024-04-23 | Disposition: A | Discharge status: Home / Self Care | Source: Ambulatory Visit | Attending: Cardiology | Admitting: Cardiology

## 2024-04-23 ENCOUNTER — Inpatient Hospital Stay

## 2024-04-23 VITALS — BP 141/78 | HR 71 | Temp 97.5°F | Resp 20

## 2024-04-23 DIAGNOSIS — D500 Iron deficiency anemia secondary to blood loss (chronic): Secondary | ICD-10-CM | POA: Diagnosis not present

## 2024-04-23 DIAGNOSIS — E538 Deficiency of other specified B group vitamins: Secondary | ICD-10-CM

## 2024-04-23 DIAGNOSIS — I700 Atherosclerosis of aorta: Secondary | ICD-10-CM | POA: Diagnosis not present

## 2024-04-23 DIAGNOSIS — D509 Iron deficiency anemia, unspecified: Secondary | ICD-10-CM

## 2024-04-23 DIAGNOSIS — K449 Diaphragmatic hernia without obstruction or gangrene: Secondary | ICD-10-CM | POA: Diagnosis not present

## 2024-04-23 DIAGNOSIS — R0602 Shortness of breath: Secondary | ICD-10-CM | POA: Insufficient documentation

## 2024-04-23 MED ORDER — CYANOCOBALAMIN 1000 MCG/ML IJ SOLN
1000.0000 ug | Freq: Once | INTRAMUSCULAR | Status: AC
  Administered 2024-04-23: 1000 ug via INTRAMUSCULAR
  Filled 2024-04-23: qty 1

## 2024-04-23 NOTE — Progress Notes (Signed)
 Patient tolerated B12 injection with no complaints voiced.  Site clean and dry with no bruising or swelling noted at site.  See MAR for details.  Band aid applied.  Patient stable during and after injection.  Vss with discharge and left in satisfactory condition with no s/s of distress noted. All follow ups as scheduled.   [name]

## 2024-04-23 NOTE — Patient Instructions (Signed)
 Vitamin B12 Injection What is this medication? Vitamin B12 (VAHY tuh min B12) prevents and treats low vitamin B12 levels in your body. It is used in people who do not get enough vitamin B12 from their diet or when their digestive tract does not absorb enough. Vitamin B12 plays an important role in maintaining the health of your nervous system and red blood cells. This medicine may be used for other purposes; ask your health care provider or pharmacist if you have questions. COMMON BRAND NAME(S): B-12 Compliance Kit, B-12 Injection Kit, Cyomin, Dodex , LA-12, Nutri-Twelve, Physicians EZ Use B-12, Primabalt, Vitamin Deficiency Injectable System - B12 What should I tell my care team before I take this medication? They need to know if you have any of these conditions: Kidney disease Leber's disease Megaloblastic anemia An unusual or allergic reaction to cyanocobalamin , cobalt, other medications, foods, dyes, or preservatives Pregnant or trying to get pregnant Breast-feeding How should I use this medication? This medication is injected into a muscle or deeply under the skin. It is usually given in a clinic or care team's office. However, your care team may teach you how to inject yourself. Follow all instructions. Talk to your care team about the use of this medication in children. Special care may be needed. Overdosage: If you think you have taken too much of this medicine contact a poison control center or emergency room at once. NOTE: This medicine is only for you. Do not share this medicine with others. What if I miss a dose? If you are given your dose at a clinic or care team's office, call to reschedule your appointment. If you give your own injections, and you miss a dose, take it as soon as you can. If it is almost time for your next dose, take only that dose. Do not take double or extra doses. What may interact with this medication? Alcohol Colchicine This list may not describe all possible  interactions. Give your health care provider a list of all the medicines, herbs, non-prescription drugs, or dietary supplements you use. Also tell them if you smoke, drink alcohol, or use illegal drugs. Some items may interact with your medicine. What should I watch for while using this medication? Visit your care team regularly. You may need blood work done while you are taking this medication. You may need to follow a special diet. Talk to your care team. Limit your alcohol intake and avoid smoking to get the best benefit. What side effects may I notice from receiving this medication? Side effects that you should report to your care team as soon as possible: Allergic reactions--skin rash, itching, hives, swelling of the face, lips, tongue, or throat Swelling of the ankles, hands, or feet Trouble breathing Side effects that usually do not require medical attention (report to your care team if they continue or are bothersome): Diarrhea This list may not describe all possible side effects. Call your doctor for medical advice about side effects. You may report side effects to FDA at 1-800-FDA-1088. Where should I keep my medication? Keep out of the reach of children. Store at room temperature between 15 and 30 degrees C (59 and 85 degrees F). Protect from light. Throw away any unused medication after the expiration date. NOTE: This sheet is a summary. It may not cover all possible information. If you have questions about this medicine, talk to your doctor, pharmacist, or health care provider.  2024 Elsevier/Gold Standard (2021-03-27 00:00:00)

## 2024-04-28 DIAGNOSIS — L308 Other specified dermatitis: Secondary | ICD-10-CM | POA: Diagnosis not present

## 2024-04-28 DIAGNOSIS — B9689 Other specified bacterial agents as the cause of diseases classified elsewhere: Secondary | ICD-10-CM | POA: Diagnosis not present

## 2024-04-28 DIAGNOSIS — L0202 Furuncle of face: Secondary | ICD-10-CM | POA: Diagnosis not present

## 2024-05-07 ENCOUNTER — Inpatient Hospital Stay: Attending: Hematology

## 2024-05-07 VITALS — BP 153/79 | HR 66 | Temp 97.2°F | Resp 20

## 2024-05-07 DIAGNOSIS — E538 Deficiency of other specified B group vitamins: Secondary | ICD-10-CM | POA: Diagnosis not present

## 2024-05-07 DIAGNOSIS — D509 Iron deficiency anemia, unspecified: Secondary | ICD-10-CM

## 2024-05-07 MED ORDER — CYANOCOBALAMIN 1000 MCG/ML IJ SOLN
1000.0000 ug | INTRAMUSCULAR | Status: DC
  Administered 2024-05-07: 1000 ug via INTRAMUSCULAR
  Filled 2024-05-07: qty 1

## 2024-05-07 NOTE — Progress Notes (Signed)
 Patient tolerated B12 injection with no complaints voiced.  Site clean and dry with no bruising or swelling noted at site.  See MAR for details.  Band aid applied.  Patient stable during and after injection.  Vss with discharge and left in satisfactory condition with no s/s of distress noted. All follow ups as scheduled.   [name]

## 2024-05-07 NOTE — Patient Instructions (Signed)
 Vitamin B12 Injection What is this medication? Vitamin B12 (VAHY tuh min B12) prevents and treats low vitamin B12 levels in your body. It is used in people who do not get enough vitamin B12 from their diet or when their digestive tract does not absorb enough. Vitamin B12 plays an important role in maintaining the health of your nervous system and red blood cells. This medicine may be used for other purposes; ask your health care provider or pharmacist if you have questions. COMMON BRAND NAME(S): B-12 Compliance Kit, B-12 Injection Kit, Cyomin, Dodex , LA-12, Nutri-Twelve, Physicians EZ Use B-12, Primabalt, Vitamin Deficiency Injectable System - B12 What should I tell my care team before I take this medication? They need to know if you have any of these conditions: Kidney disease Leber's disease Megaloblastic anemia An unusual or allergic reaction to cyanocobalamin , cobalt, other medications, foods, dyes, or preservatives Pregnant or trying to get pregnant Breast-feeding How should I use this medication? This medication is injected into a muscle or deeply under the skin. It is usually given in a clinic or care team's office. However, your care team may teach you how to inject yourself. Follow all instructions. Talk to your care team about the use of this medication in children. Special care may be needed. Overdosage: If you think you have taken too much of this medicine contact a poison control center or emergency room at once. NOTE: This medicine is only for you. Do not share this medicine with others. What if I miss a dose? If you are given your dose at a clinic or care team's office, call to reschedule your appointment. If you give your own injections, and you miss a dose, take it as soon as you can. If it is almost time for your next dose, take only that dose. Do not take double or extra doses. What may interact with this medication? Alcohol Colchicine This list may not describe all possible  interactions. Give your health care provider a list of all the medicines, herbs, non-prescription drugs, or dietary supplements you use. Also tell them if you smoke, drink alcohol, or use illegal drugs. Some items may interact with your medicine. What should I watch for while using this medication? Visit your care team regularly. You may need blood work done while you are taking this medication. You may need to follow a special diet. Talk to your care team. Limit your alcohol intake and avoid smoking to get the best benefit. What side effects may I notice from receiving this medication? Side effects that you should report to your care team as soon as possible: Allergic reactions--skin rash, itching, hives, swelling of the face, lips, tongue, or throat Swelling of the ankles, hands, or feet Trouble breathing Side effects that usually do not require medical attention (report to your care team if they continue or are bothersome): Diarrhea This list may not describe all possible side effects. Call your doctor for medical advice about side effects. You may report side effects to FDA at 1-800-FDA-1088. Where should I keep my medication? Keep out of the reach of children. Store at room temperature between 15 and 30 degrees C (59 and 85 degrees F). Protect from light. Throw away any unused medication after the expiration date. NOTE: This sheet is a summary. It may not cover all possible information. If you have questions about this medicine, talk to your doctor, pharmacist, or health care provider.  2024 Elsevier/Gold Standard (2021-03-27 00:00:00)

## 2024-05-12 DIAGNOSIS — X32XXXD Exposure to sunlight, subsequent encounter: Secondary | ICD-10-CM | POA: Diagnosis not present

## 2024-05-12 DIAGNOSIS — L570 Actinic keratosis: Secondary | ICD-10-CM | POA: Diagnosis not present

## 2024-05-12 DIAGNOSIS — L821 Other seborrheic keratosis: Secondary | ICD-10-CM | POA: Diagnosis not present

## 2024-05-21 ENCOUNTER — Inpatient Hospital Stay

## 2024-05-21 VITALS — BP 153/76 | HR 70 | Temp 97.2°F | Resp 20

## 2024-05-21 DIAGNOSIS — D509 Iron deficiency anemia, unspecified: Secondary | ICD-10-CM

## 2024-05-21 DIAGNOSIS — E538 Deficiency of other specified B group vitamins: Secondary | ICD-10-CM | POA: Diagnosis not present

## 2024-05-21 MED ORDER — CYANOCOBALAMIN 1000 MCG/ML IJ SOLN
1000.0000 ug | INTRAMUSCULAR | Status: DC
  Administered 2024-05-21: 1000 ug via INTRAMUSCULAR
  Filled 2024-05-21: qty 1

## 2024-05-21 NOTE — Progress Notes (Signed)
 Patient tolerated injection with no complaints voiced.  Site clean and dry with no bruising or swelling noted at site.  See MAR for details.  Band aid applied.  Patient stable during and after injection.  Vss with discharge and left in satisfactory condition with no s/s of distress noted.

## 2024-05-21 NOTE — Patient Instructions (Signed)

## 2024-06-04 ENCOUNTER — Ambulatory Visit: Payer: Self-pay | Admitting: Cardiology

## 2024-06-04 ENCOUNTER — Telehealth: Payer: Self-pay | Admitting: Family Medicine

## 2024-06-04 ENCOUNTER — Inpatient Hospital Stay: Attending: Hematology

## 2024-06-04 VITALS — BP 139/59 | HR 76 | Temp 97.4°F | Resp 18

## 2024-06-04 DIAGNOSIS — D509 Iron deficiency anemia, unspecified: Secondary | ICD-10-CM

## 2024-06-04 DIAGNOSIS — E538 Deficiency of other specified B group vitamins: Secondary | ICD-10-CM | POA: Diagnosis not present

## 2024-06-04 MED ORDER — CYANOCOBALAMIN 1000 MCG/ML IJ SOLN
1000.0000 ug | Freq: Once | INTRAMUSCULAR | Status: AC
  Administered 2024-06-04: 1000 ug via INTRAMUSCULAR
  Filled 2024-06-04: qty 1

## 2024-06-04 NOTE — Progress Notes (Signed)
 Patient tolerated injection with no complaints voiced. Site clean and dry with no bruising or swelling noted at site. See MAR for details. Band aid applied.  Patient stable during and after injection. VSS with discharge and left in satisfactory condition with no s/s of distress noted.

## 2024-06-04 NOTE — Telephone Encounter (Signed)
Pt returning call for results. Please advise.  

## 2024-06-04 NOTE — Telephone Encounter (Signed)
 Patient saw cardiology They did a chest x-ray Showed a large hiatal hernia-please inform the patient that the x-ray was sent to us  because of that result-we recommend the following  Please inform patient she has a large hiatal hernia we would recommend office visit to discuss this-typically in the situations we see how her GI symptoms are going and discuss whether or not she wants to have intervention including the possibility of consultation with surgeon for laparoscopic surgery to correct this  That would have to be a referral so therefore it would be wise for her to do an office visit thank you

## 2024-06-04 NOTE — Patient Instructions (Signed)
 [facility] [place] - [facility]  Discharge Instructions: Thank you for choosing [facility] to provide your oncology and hematology care.  If you have a lab appointment with the Cancer Center - please note that after April 8th, 2024, all labs will be drawn in the cancer center.  You do not have to check in or register with the main entrance as you have in the past but will complete your check-in in the cancer center.  Wear comfortable clothing and clothing appropriate for easy access to any Portacath or PICC line.   We strive to give you quality time with your provider. You may need to reschedule your appointment if you arrive late (15 or more minutes).  Arriving late affects you and other patients whose appointments are after yours.  Also, if you miss three or more appointments without notifying the office, you may be dismissed from the clinic at the provider's discretion.      For prescription refill requests, have your pharmacy contact our office and allow 72 hours for refills to be completed.    Today you received the following B12 return as scheduled.   To help prevent nausea and vomiting after your treatment, we encourage you to take your nausea medication as directed.  BELOW ARE SYMPTOMS THAT SHOULD BE REPORTED IMMEDIATELY: *FEVER GREATER THAN 100.4 F (38 C) OR HIGHER *CHILLS OR SWEATING *NAUSEA AND VOMITING THAT IS NOT CONTROLLED WITH YOUR NAUSEA MEDICATION *UNUSUAL SHORTNESS OF BREATH *UNUSUAL BRUISING OR BLEEDING *URINARY PROBLEMS (pain or burning when urinating, or frequent urination) *BOWEL PROBLEMS (unusual diarrhea, constipation, pain near the anus) TENDERNESS IN MOUTH AND THROAT WITH OR WITHOUT PRESENCE OF ULCERS (sore throat, sores in mouth, or a toothache) UNUSUAL RASH, SWELLING OR PAIN  UNUSUAL VAGINAL DISCHARGE OR ITCHING   Items with * indicate a potential emergency and should be followed up as soon as possible or go to  the Emergency Department if any problems should occur.  Please show the CHEMOTHERAPY ALERT CARD or IMMUNOTHERAPY ALERT CARD at check-in to the Emergency Department and triage nurse.  Should you have questions after your visit or need to cancel or reschedule your appointment, please contact [facility] [place] - A DEPT OF Eligha Bridegroom [facility] [phone]  and follow the prompts.  Office hours are 8:00 a.m. to 4:30 p.m. Monday - Friday. Please note that voicemails left after 4:00 p.m. may not be returned until the following business day.  We are closed weekends and major holidays. You have access to a nurse at all times for urgent questions. Please call the main number to the clinic [phone] and follow the prompts.  For any non-urgent questions, you may also contact your provider using MyChart. We now offer e-Visits for anyone 33 and older to request care online for non-urgent symptoms. For details visit [email or web address].   Also download the MyChart app! Go to the app store, search "MyChart", open the app, select [place], and log in with your MyChart username and password.

## 2024-06-08 NOTE — Telephone Encounter (Signed)
 Left message to return call

## 2024-06-14 NOTE — Telephone Encounter (Signed)
 Left message for pt to return call.

## 2024-06-15 NOTE — Telephone Encounter (Signed)
 Letter sent

## 2024-06-18 ENCOUNTER — Inpatient Hospital Stay

## 2024-06-18 VITALS — BP 151/71 | HR 68 | Temp 96.7°F | Resp 20

## 2024-06-18 DIAGNOSIS — E538 Deficiency of other specified B group vitamins: Secondary | ICD-10-CM | POA: Diagnosis not present

## 2024-06-18 DIAGNOSIS — D509 Iron deficiency anemia, unspecified: Secondary | ICD-10-CM

## 2024-06-18 MED ORDER — CYANOCOBALAMIN 1000 MCG/ML IJ SOLN
1000.0000 ug | INTRAMUSCULAR | Status: DC
  Administered 2024-06-18: 1000 ug via INTRAMUSCULAR
  Filled 2024-06-18: qty 1

## 2024-06-18 NOTE — Progress Notes (Signed)
[name] presents today for B12 injection per the provider's orders.  Stable during administration without incident; injection site WNL; see MAR for injection details.  Patient tolerated procedure well and without incident.  No questions or complaints noted at this time.  

## 2024-06-18 NOTE — Patient Instructions (Signed)

## 2024-06-29 ENCOUNTER — Ambulatory Visit (HOSPITAL_COMMUNITY): Admission: RE | Admit: 2024-06-29 | Source: Ambulatory Visit

## 2024-07-02 ENCOUNTER — Inpatient Hospital Stay: Attending: Hematology

## 2024-07-02 VITALS — BP 147/75 | HR 73 | Temp 96.4°F | Resp 20

## 2024-07-02 DIAGNOSIS — D500 Iron deficiency anemia secondary to blood loss (chronic): Secondary | ICD-10-CM | POA: Diagnosis not present

## 2024-07-02 DIAGNOSIS — E559 Vitamin D deficiency, unspecified: Secondary | ICD-10-CM | POA: Insufficient documentation

## 2024-07-02 DIAGNOSIS — E538 Deficiency of other specified B group vitamins: Secondary | ICD-10-CM | POA: Insufficient documentation

## 2024-07-02 DIAGNOSIS — D509 Iron deficiency anemia, unspecified: Secondary | ICD-10-CM

## 2024-07-02 MED ORDER — CYANOCOBALAMIN 1000 MCG/ML IJ SOLN
1000.0000 ug | Freq: Once | INTRAMUSCULAR | Status: AC
  Administered 2024-07-02: 1000 ug via INTRAMUSCULAR
  Filled 2024-07-02: qty 1

## 2024-07-02 NOTE — Progress Notes (Signed)
 Patient tolerated injection with no complaints voiced. Site clean and dry with no bruising or swelling noted at site. See MAR for details. Band aid applied.  Patient stable during and after injection. VSS with discharge and left in satisfactory condition with no s/s of distress noted.

## 2024-07-02 NOTE — Patient Instructions (Signed)
 [facility] [place] [facility]  Discharge Instructions: Thank you for choosing [facility] to provide your oncology and hematology care.  If you have a lab appointment with the Cancer Center - please note that after April 8th, 2024, all labs will be drawn in the cancer center.  You do not have to check in or register with the main entrance as you have in the past but will complete your check-in in the cancer center.  Wear comfortable clothing and clothing appropriate for easy access to any Portacath or PICC line.   We strive to give you quality time with your provider. You may need to reschedule your appointment if you arrive late (15 or more minutes).  Arriving late affects you and other patients whose appointments are after yours.  Also, if you miss three or more appointments without notifying the office, you may be dismissed from the clinic at the provider's discretion.      For prescription refill requests, have your pharmacy contact our office and allow 72 hours for refills to be completed.    Today you received the following B12 injection, return as scheduled.   To help prevent nausea and vomiting after your treatment, we encourage you to take your nausea medication as directed.  BELOW ARE SYMPTOMS THAT SHOULD BE REPORTED IMMEDIATELY: *FEVER GREATER THAN 100.4 F (38 C) OR HIGHER *CHILLS OR SWEATING *NAUSEA AND VOMITING THAT IS NOT CONTROLLED WITH YOUR NAUSEA MEDICATION *UNUSUAL SHORTNESS OF BREATH *UNUSUAL BRUISING OR BLEEDING *URINARY PROBLEMS (pain or burning when urinating, or frequent urination) *BOWEL PROBLEMS (unusual diarrhea, constipation, pain near the anus) TENDERNESS IN MOUTH AND THROAT WITH OR WITHOUT PRESENCE OF ULCERS (sore throat, sores in mouth, or a toothache) UNUSUAL RASH, SWELLING OR PAIN  UNUSUAL VAGINAL DISCHARGE OR ITCHING   Items with * indicate a potential emergency and should be followed up as soon as  possible or go to the Emergency Department if any problems should occur.  Please show the CHEMOTHERAPY ALERT CARD or IMMUNOTHERAPY ALERT CARD at check-in to the Emergency Department and triage nurse.  Should you have questions after your visit or need to cancel or reschedule your appointment, please contact [facility]ER [facility] [place] - A DEPT OF [facility] [phone]  and follow the prompts.  Office hours are 8:00 a.m. to 4:30 p.m. Monday - Friday. Please note that voicemails left after 4:00 p.m. may not be returned until the following business day.  We are closed weekends and major holidays. You have access to a nurse at all times for urgent questions. Please call the main number to the clinic [phone] and follow the prompts.  For any non-urgent questions, you may also contact your provider using MyChart. We now offer e-Visits for anyone 69 and older to request care online for non-urgent symptoms. For details visit [email or web address].   Also download the MyChart app! Go to the app store, search "MyChart", open the app, select [place], and log in with your MyChart username and password.

## 2024-07-16 ENCOUNTER — Inpatient Hospital Stay

## 2024-07-16 ENCOUNTER — Other Ambulatory Visit: Payer: Self-pay

## 2024-07-16 VITALS — BP 133/80 | HR 61 | Temp 97.1°F | Resp 20

## 2024-07-16 DIAGNOSIS — E538 Deficiency of other specified B group vitamins: Secondary | ICD-10-CM

## 2024-07-16 DIAGNOSIS — D509 Iron deficiency anemia, unspecified: Secondary | ICD-10-CM

## 2024-07-16 MED ORDER — CYANOCOBALAMIN 1000 MCG/ML IJ SOLN
1000.0000 ug | INTRAMUSCULAR | Status: DC
  Administered 2024-07-16: 1000 ug via INTRAMUSCULAR
  Filled 2024-07-16: qty 1

## 2024-07-16 NOTE — Progress Notes (Signed)
 [name] presents today for injection per the provider's orders.  B12 administration without incident; injection site WNL; see MAR for injection details.  Patient tolerated procedure well and without incident.  No questions or concerns noted. Discharged from clinic ambulatory in stable condition. Alert and oriented x 3. F/U with [facility] as scheduled.

## 2024-07-16 NOTE — Patient Instructions (Signed)
 [facility] [place] - [facility]  Discharge Instructions: Thank you for choosing [facility] to provide your oncology and hematology care.  If you have a lab appointment with the Cancer Center - please note that after April 8th, 2024, all labs will be drawn in the cancer center.  You do not have to check in or register with the main entrance as you have in the past but will complete your check-in in the cancer center.  Wear comfortable clothing and clothing appropriate for easy access to any Portacath or PICC line.   We strive to give you quality time with your provider. You may need to reschedule your appointment if you arrive late (15 or more minutes).  Arriving late affects you and other patients whose appointments are after yours.  Also, if you miss three or more appointments without notifying the office, you may be dismissed from the clinic at the provider's discretion.      For prescription refill requests, have your pharmacy contact our office and allow 72 hours for refills to be completed.    Today you received the following chemotherapy and/or immunotherapy agents Vitamin B12 injection.  Vitamin B12 Injection What is this medication? Vitamin B12 (VAHY tuh min B12) prevents and treats low vitamin B12 levels in your body. It is used in people who do not get enough vitamin B12 from their diet or when their digestive tract does not absorb enough. Vitamin B12 plays an important role in maintaining the health of your nervous system and red blood cells. This medicine may be used for other purposes; ask your health care provider or pharmacist if you have questions. COMMON BRAND NAME(S): B-12 Compliance Kit, B-12 Injection Kit, Cyomin, Dodex, LA-12, Nutri-Twelve, Physicians EZ Use B-12, Primabalt, Vitamin Deficiency Injectable System - B12 What should I tell my care team before I take this medication? They need to know if you have any of these  conditions: Kidney disease Leber's disease Megaloblastic anemia An unusual or allergic reaction to cyanocobalamin, cobalt, other medications, foods, dyes, or preservatives Pregnant or trying to get pregnant Breast-feeding How should I use this medication? This medication is injected into a muscle or deeply under the skin. It is usually given in a clinic or care team's office. However, your care team may teach you how to inject yourself. Follow all instructions. Talk to your care team about the use of this medication in children. Special care may be needed. Overdosage: If you think you have taken too much of this medicine contact a poison control center or emergency room at once. NOTE: This medicine is only for you. Do not share this medicine with others. What if I miss a dose? If you are given your dose at a clinic or care team's office, call to reschedule your appointment. If you give your own injections, and you miss a dose, take it as soon as you can. If it is almost time for your next dose, take only that dose. Do not take double or extra doses. What may interact with this medication? Alcohol Colchicine This list may not describe all possible interactions. Give your health care provider a list of all the medicines, herbs, non-prescription drugs, or dietary supplements you use. Also tell them if you smoke, drink alcohol, or use illegal drugs. Some items may interact with your medicine. What should I watch for while using this medication? Visit your care team regularly. You may need blood work  done while you are taking this medication. You may need to follow a special diet. Talk to your care team. Limit your alcohol intake and avoid smoking to get the best benefit. What side effects may I notice from receiving this medication? Side effects that you should report to your care team as soon as possible: Allergic reactions--skin rash, itching, hives, swelling of the face, lips, tongue, or  throat Swelling of the ankles, hands, or feet Trouble breathing Side effects that usually do not require medical attention (report to your care team if they continue or are bothersome): Diarrhea This list may not describe all possible side effects. Call your doctor for medical advice about side effects. You may report side effects to FDA at 1-800-FDA-1088. Where should I keep my medication? Keep out of the reach of children. Store at room temperature between 15 and 30 degrees C (59 and 85 degrees F). Protect from light. Throw away any unused medication after the expiration date. NOTE: This sheet is a summary. It may not cover all possible information. If you have questions about this medicine, talk to your doctor, pharmacist, or health care provider.  2024 Elsevier/Gold Standard (2021-03-27 00:00:00)      To help prevent nausea and vomiting after your treatment, we encourage you to take your nausea medication as directed.  BELOW ARE SYMPTOMS THAT SHOULD BE REPORTED IMMEDIATELY: *FEVER GREATER THAN 100.4 F (38 C) OR HIGHER *CHILLS OR SWEATING *NAUSEA AND VOMITING THAT IS NOT CONTROLLED WITH YOUR NAUSEA MEDICATION *UNUSUAL SHORTNESS OF BREATH *UNUSUAL BRUISING OR BLEEDING *URINARY PROBLEMS (pain or burning when urinating, or frequent urination) *BOWEL PROBLEMS (unusual diarrhea, constipation, pain near the anus) TENDERNESS IN MOUTH AND THROAT WITH OR WITHOUT PRESENCE OF ULCERS (sore throat, sores in mouth, or a toothache) UNUSUAL RASH, SWELLING OR PAIN  UNUSUAL VAGINAL DISCHARGE OR ITCHING   Items with * indicate a potential emergency and should be followed up as soon as possible or go to the Emergency Department if any problems should occur.  Please show the CHEMOTHERAPY ALERT CARD or IMMUNOTHERAPY ALERT CARD at check-in to the Emergency Department and triage nurse.  Should you have questions after your visit or need to cancel or reschedule your appointment, please contact [facility] CANCER  CTR [place] - A DEPT OF [facility] Bridegroom [facility] [phone]  and follow the prompts.  Office hours are 8:00 a.m. to 4:30 p.m. Monday - Friday. Please note that voicemails left after 4:00 p.m. may not be returned until the following business day.  We are closed weekends and major holidays. You have access to a nurse at all times for urgent questions. Please call the main number to the clinic [phone] and follow the prompts.  For any non-urgent questions, you may also contact your provider using MyChart. We now offer e-Visits for anyone 4 and older to request care online for non-urgent symptoms. For details visit [email or web address].   Also download the MyChart app! Go to the app store, search "MyChart", open the app, select [place], and log in with your MyChart username and password.

## 2024-07-19 ENCOUNTER — Inpatient Hospital Stay

## 2024-07-19 DIAGNOSIS — E559 Vitamin D deficiency, unspecified: Secondary | ICD-10-CM

## 2024-07-19 DIAGNOSIS — E538 Deficiency of other specified B group vitamins: Secondary | ICD-10-CM

## 2024-07-19 DIAGNOSIS — D500 Iron deficiency anemia secondary to blood loss (chronic): Secondary | ICD-10-CM

## 2024-07-19 LAB — CBC WITH DIFFERENTIAL/PLATELET
Abs Immature Granulocytes: 0.02 K/uL (ref 0.00–0.07)
Basophils Absolute: 0.1 K/uL (ref 0.0–0.1)
Basophils Relative: 1 %
Eosinophils Absolute: 0.1 K/uL (ref 0.0–0.5)
Eosinophils Relative: 1 %
HCT: 44.8 % (ref 36.0–46.0)
Hemoglobin: 15.5 g/dL — ABNORMAL HIGH (ref 12.0–15.0)
Immature Granulocytes: 0 %
Lymphocytes Relative: 24 %
Lymphs Abs: 1.9 K/uL (ref 0.7–4.0)
MCH: 30.5 pg (ref 26.0–34.0)
MCHC: 34.6 g/dL (ref 30.0–36.0)
MCV: 88 fL (ref 80.0–100.0)
Monocytes Absolute: 0.6 K/uL (ref 0.1–1.0)
Monocytes Relative: 7 %
Neutro Abs: 5.3 K/uL (ref 1.7–7.7)
Neutrophils Relative %: 67 %
Platelets: 208 K/uL (ref 150–400)
RBC: 5.09 MIL/uL (ref 3.87–5.11)
RDW: 12.5 % (ref 11.5–15.5)
WBC: 7.9 K/uL (ref 4.0–10.5)
nRBC: 0 % (ref 0.0–0.2)

## 2024-07-19 LAB — IRON AND TIBC
Iron: 60 ug/dL (ref 28–170)
Saturation Ratios: 21 % (ref 10.4–31.8)
TIBC: 286 ug/dL (ref 250–450)
UIBC: 226 ug/dL

## 2024-07-19 LAB — VITAMIN D 25 HYDROXY (VIT D DEFICIENCY, FRACTURES): Vit D, 25-Hydroxy: 47.9 ng/mL (ref 30–100)

## 2024-07-19 LAB — VITAMIN B12: Vitamin B-12: 869 pg/mL (ref 180–914)

## 2024-07-19 LAB — FERRITIN: Ferritin: 341 ng/mL — ABNORMAL HIGH (ref 11–307)

## 2024-07-22 LAB — METHYLMALONIC ACID, SERUM: Methylmalonic Acid, Quantitative: 180 nmol/L (ref 0–378)

## 2024-07-23 ENCOUNTER — Inpatient Hospital Stay

## 2024-07-30 ENCOUNTER — Inpatient Hospital Stay: Attending: Hematology | Admitting: Oncology

## 2024-07-30 ENCOUNTER — Inpatient Hospital Stay

## 2024-07-30 VITALS — BP 138/80 | HR 70 | Temp 97.9°F | Resp 19 | Wt 227.0 lb

## 2024-07-30 DIAGNOSIS — D508 Other iron deficiency anemias: Secondary | ICD-10-CM | POA: Diagnosis not present

## 2024-07-30 DIAGNOSIS — E538 Deficiency of other specified B group vitamins: Secondary | ICD-10-CM

## 2024-07-30 DIAGNOSIS — D509 Iron deficiency anemia, unspecified: Secondary | ICD-10-CM

## 2024-07-30 DIAGNOSIS — D500 Iron deficiency anemia secondary to blood loss (chronic): Secondary | ICD-10-CM

## 2024-07-30 DIAGNOSIS — E559 Vitamin D deficiency, unspecified: Secondary | ICD-10-CM | POA: Diagnosis not present

## 2024-07-30 MED ORDER — CYANOCOBALAMIN 1000 MCG/ML IJ SOLN
1000.0000 ug | INTRAMUSCULAR | Status: DC
  Administered 2024-07-30: 1000 ug via INTRAMUSCULAR
  Filled 2024-07-30: qty 1

## 2024-07-30 MED ORDER — VITAMIN D (ERGOCALCIFEROL) 1.25 MG (50000 UNIT) PO CAPS: 50000.0000 [IU] | ORAL_CAPSULE | ORAL | 4 refills | Status: AC

## 2024-07-30 NOTE — Assessment & Plan Note (Addendum)
-  Vitamin D  level 47.9. -Continue calcium and vitamin D  supplements.

## 2024-07-30 NOTE — Assessment & Plan Note (Addendum)
-   B12 levels have been intermittently low. - B12 injections were increased from monthly to every other week with improvement of her B12 level. -Recent labs from 07/19/2024 show improvement of her B12 level to greater than 800.  MMA is normal. -Recommend reducing B12 back down to monthly. -Recheck labs in 3 months.

## 2024-07-30 NOTE — Patient Instructions (Signed)
 [facility] [place] [facility]  Discharge Instructions: Thank you for choosing [facility] to provide your oncology and hematology care.  If you have a lab appointment with the Cancer Center - please note that after April 8th, 2024, all labs will be drawn in the cancer center.  You do not have to check in or register with the main entrance as you have in the past but will complete your check-in in the cancer center.  Wear comfortable clothing and clothing appropriate for easy access to any Portacath or PICC line.   We strive to give you quality time with your provider. You may need to reschedule your appointment if you arrive late (15 or more minutes).  Arriving late affects you and other patients whose appointments are after yours.  Also, if you miss three or more appointments without notifying the office, you may be dismissed from the clinic at the provider's discretion.      For prescription refill requests, have your pharmacy contact our office and allow 72 hours for refills to be completed.    Today you received the following B12 injection, return as scheduled.   To help prevent nausea and vomiting after your treatment, we encourage you to take your nausea medication as directed.  BELOW ARE SYMPTOMS THAT SHOULD BE REPORTED IMMEDIATELY: *FEVER GREATER THAN 100.4 F (38 C) OR HIGHER *CHILLS OR SWEATING *NAUSEA AND VOMITING THAT IS NOT CONTROLLED WITH YOUR NAUSEA MEDICATION *UNUSUAL SHORTNESS OF BREATH *UNUSUAL BRUISING OR BLEEDING *URINARY PROBLEMS (pain or burning when urinating, or frequent urination) *BOWEL PROBLEMS (unusual diarrhea, constipation, pain near the anus) TENDERNESS IN MOUTH AND THROAT WITH OR WITHOUT PRESENCE OF ULCERS (sore throat, sores in mouth, or a toothache) UNUSUAL RASH, SWELLING OR PAIN  UNUSUAL VAGINAL DISCHARGE OR ITCHING   Items with * indicate a potential emergency and should be followed up as soon as  possible or go to the Emergency Department if any problems should occur.  Please show the CHEMOTHERAPY ALERT CARD or IMMUNOTHERAPY ALERT CARD at check-in to the Emergency Department and triage nurse.  Should you have questions after your visit or need to cancel or reschedule your appointment, please contact [facility]ER [facility] [place] - A DEPT OF [facility] [phone]  and follow the prompts.  Office hours are 8:00 a.m. to 4:30 p.m. Monday - Friday. Please note that voicemails left after 4:00 p.m. may not be returned until the following business day.  We are closed weekends and major holidays. You have access to a nurse at all times for urgent questions. Please call the main number to the clinic [phone] and follow the prompts.  For any non-urgent questions, you may also contact your provider using MyChart. We now offer e-Visits for anyone 69 and older to request care online for non-urgent symptoms. For details visit [email or web address].   Also download the MyChart app! Go to the app store, search "MyChart", open the app, select [place], and log in with your MyChart username and password.

## 2024-07-30 NOTE — Progress Notes (Signed)
 "  [name] OFFICE PROGRESS NOTE  Alphonsa Glendia LABOR, MD  ASSESSMENT & PLAN:    Assessment & Plan Other iron  deficiency anemia -Etiology felt to be secondary to blood loss.  She currently denies any melena, hematochezia or obvious blood loss. -IV Venofer  200 mg x 2 doses on 01/07/2024 and 01/14/2024. -Unable to tolerate oral iron . -Likes to keep her ferritin greater than 150 per recommendations of previous hematologist and history. -Labs from 07/19/2024 show hemoglobin of 15.5 with iron  saturations of 21% (40%) and normal TIBC.  Ferritin is still elevated at 341 but this is likely an acute phase reactant. - Recommend 2 additional doses of IV Venofer  given decline in iron  saturations. -Recommend rechecking in 3 months.  B12 deficiency - B12 levels have been intermittently low. - B12 injections were increased from monthly to every other week with improvement of her B12 level. -Recent labs from 07/19/2024 show improvement of her B12 level to greater than 800.  MMA is normal. -Recommend reducing B12 back down to monthly. -Recheck labs in 3 months. Vitamin D  deficiency -Vitamin D  level 47.9. -Continue calcium and vitamin D  supplements.    Orders Placed This Encounter  Procedures   CBC with Differential    Standing Status:   Future    Expected Date:   10/28/2024    Expiration Date:   01/26/2025   Ferritin    Standing Status:   Future    Expected Date:   10/28/2024    Expiration Date:   01/26/2025   Iron  and TIBC ([facility] ONLY)    Standing Status:   Future    Expected Date:   10/28/2024    Expiration Date:   01/26/2025   Methylmalonic acid, serum    Standing Status:   Future    Expected Date:   10/28/2024    Expiration Date:   01/26/2025   Vitamin B12    Standing Status:   Future    Expected Date:   10/28/2024    Expiration Date:   01/26/2025   Vitamin D  25 hydroxy    Standing Status:   Future    Expected Date:   10/28/2024    Expiration Date:   01/26/2025     INTERVAL HISTORY: Patient returns for follow-up.   [name] is a 74 year old female with past medical history significant for iron  deficiency anemia and vitamin B12 deficiency.   She is receiving B12 shots every 2 weeks with good tolerance.  She denies any interval hospitalizations, surgeries or changes to her baseline health.  Overall, patient is doing well.  She is here to review her most recent lab results.    Reports weight gain over the past few months secondary to poor eating and lack of exercise.  She has attempted to get out and walk around but has developed some shortness of breath and joint pain.  Reports she knows that she has a hiatal hernia and she is going to get this checked by GI in the next little bit to see if any intervention is needed.  She feels like this may be contributing some to her shortness of breath.  She denies any bleeding, melena or hematochezia.  She continues to work 3 days/week at [employer].  We reviewed CBC, vitamin B12, MMA, vitamin D , ferritin and iron  panel.  SUMMARY OF HEMATOLOGIC HISTORY: Oncology History   No problem history exists.     CBC    Component Value Date/Time   WBC 7.9 07/19/2024  1529   RBC 5.09 07/19/2024 1529   HGB 15.5 (H) 07/19/2024 1529   HGB 13.0 05/08/2022 1153   HCT 44.8 07/19/2024 1529   HCT 39.6 05/08/2022 1153   PLT 208 07/19/2024 1529   PLT 241 05/08/2022 1153   MCV 88.0 07/19/2024 1529   MCV 88 05/08/2022 1153   MCH 30.5 07/19/2024 1529   MCHC 34.6 07/19/2024 1529   RDW 12.5 07/19/2024 1529   RDW 13.1 05/08/2022 1153   LYMPHSABS 1.9 07/19/2024 1529   LYMPHSABS 1.5 05/08/2022 1153   MONOABS 0.6 07/19/2024 1529   EOSABS 0.1 07/19/2024 1529   EOSABS 0.1 05/08/2022 1153   BASOSABS 0.1 07/19/2024 1529   BASOSABS 0.1 05/08/2022 1153       Latest Ref Rng & Units 08/16/2022    2:31 PM 05/08/2022   11:53 AM 05/01/2022    9:29 AM  CMP  Glucose 70 - 99 mg/dL 874  99  91   BUN 8 - 23 mg/dL 15  14  16     Creatinine 0.44 - 1.00 mg/dL 9.26  9.38  9.39   Sodium 135 - 145 mmol/L 134  141  140   Potassium 3.5 - 5.1 mmol/L 4.0  4.3  4.3   Chloride 98 - 111 mmol/L 103  106  105   CO2 22 - 32 mmol/L 22  21  23    Calcium 8.9 - 10.3 mg/dL 8.7  9.2  9.4   Total Protein 6.5 - 8.1 g/dL 7.9  7.0  6.8   Total Bilirubin 0.3 - 1.2 mg/dL 0.5  0.3  0.3   Alkaline Phos 38 - 126 U/L 94  112  113   AST 15 - 41 U/L 33  17  12   ALT 0 - 44 U/L 25  14  15       Lab Results  Component Value Date   FERRITIN 341 (H) 07/19/2024   VITAMINB12 869 07/19/2024    Vitals:   07/30/24 1022  BP: 138/80  Pulse: 70  Resp: 19  Temp: 97.9 F (36.6 C)  SpO2: 96%    Review of System:  Review of Systems  Constitutional:  Positive for malaise/fatigue.  Respiratory:  Positive for shortness of breath.     Physical Exam: Physical Exam Constitutional:      Appearance: Normal appearance. She is obese.  HENT:     Head: Normocephalic and atraumatic.  Eyes:     Pupils: Pupils are equal, round, and reactive to light.  Cardiovascular:     Rate and Rhythm: Normal rate and regular rhythm.     Heart sounds: Normal heart sounds. No murmur heard. Pulmonary:     Effort: Pulmonary effort is normal.     Breath sounds: Normal breath sounds. No wheezing.  Abdominal:     General: Bowel sounds are normal. There is no distension.     Palpations: Abdomen is soft.     Tenderness: There is no abdominal tenderness.  Musculoskeletal:        General: Normal range of motion.     Cervical back: Normal range of motion.  Skin:    General: Skin is warm and dry.     Findings: No rash.  Neurological:     Mental Status: She is alert and oriented to person, place, and time.     Gait: Gait is intact.  Psychiatric:        Mood and Affect: Mood and affect normal.        Cognition  and Memory: Memory normal.        Judgment: Judgment normal.      I spent 25 minutes dedicated to the care of this patient (face-to-face and  non-face-to-face) on the date of the encounter to include what is described in the assessment and plan.,  Delon Hope, NP 08/02/2024 10:29 AM "

## 2024-07-30 NOTE — Progress Notes (Signed)
 Patient tolerated injection with no complaints voiced. Site clean and dry with no bruising or swelling noted at site. See MAR for details. Band aid applied.  Patient stable during and after injection. VSS with discharge and left in satisfactory condition with no s/s of distress noted.

## 2024-07-30 NOTE — Assessment & Plan Note (Deleted)
-  Etiology felt to be secondary to blood loss.  She currently denies any melena, hematochezia or obvious blood loss. -IV Venofer  200 mg x 2 doses on 01/07/2024 and 01/14/2024. -Unable to tolerate oral iron . -Likes to keep her ferritin greater than 150 per recommendations of previous hematologist and history. -Labs from 04/02/2024 show iron  saturations 40% with a ferritin of 325. -We discussed no additional IV iron  needed at this time. -Recommend rechecking in 3 months.

## 2024-07-30 NOTE — Assessment & Plan Note (Addendum)
-  Etiology felt to be secondary to blood loss.  She currently denies any melena, hematochezia or obvious blood loss. -IV Venofer  200 mg x 2 doses on 01/07/2024 and 01/14/2024. -Unable to tolerate oral iron . -Likes to keep her ferritin greater than 150 per recommendations of previous hematologist and history. -Labs from 07/19/2024 show hemoglobin of 15.5 with iron  saturations of 21% (40%) and normal TIBC.  Ferritin is still elevated at 341 but this is likely an acute phase reactant. - Recommend 2 additional doses of IV Venofer  given decline in iron  saturations. -Recommend rechecking in 3 months.

## 2024-08-02 ENCOUNTER — Encounter: Payer: Self-pay | Admitting: Oncology

## 2024-08-06 ENCOUNTER — Inpatient Hospital Stay

## 2024-08-13 ENCOUNTER — Inpatient Hospital Stay

## 2024-08-13 VITALS — BP 142/68 | HR 60 | Temp 97.3°F | Resp 18

## 2024-08-13 DIAGNOSIS — E538 Deficiency of other specified B group vitamins: Secondary | ICD-10-CM

## 2024-08-13 DIAGNOSIS — D509 Iron deficiency anemia, unspecified: Secondary | ICD-10-CM

## 2024-08-13 MED ORDER — SODIUM CHLORIDE 0.9 % IV SOLN: INTRAVENOUS | Status: DC

## 2024-08-13 MED ORDER — CYANOCOBALAMIN 1000 MCG/ML IJ SOLN: 1000.0000 ug | INTRAMUSCULAR | Status: DC

## 2024-08-13 MED ORDER — IRON SUCROSE 400 MG IVPB - SIMPLE MED
400.0000 mg | Freq: Once | Status: AC
  Administered 2024-08-13: 400 mg via INTRAVENOUS
  Filled 2024-08-13: qty 400

## 2024-08-13 NOTE — Patient Instructions (Signed)

## 2024-08-13 NOTE — Progress Notes (Signed)
 Patient presents today for Venofer infusion per providers order.  Vital signs WNL.  Patient has no new complaints at this time.  Peripheral IV started and blood return noted pre and post infusion.  Stable during infusion without adverse affects.  Vital signs stable.  No complaints at this time.  Discharge from clinic ambulatory in stable condition.  Alert and oriented X 3.  Follow up with [facility] as scheduled.

## 2024-08-20 ENCOUNTER — Inpatient Hospital Stay

## 2024-08-30 ENCOUNTER — Inpatient Hospital Stay

## 2024-09-03 ENCOUNTER — Inpatient Hospital Stay

## 2024-09-27 ENCOUNTER — Inpatient Hospital Stay

## 2024-10-01 ENCOUNTER — Inpatient Hospital Stay: Attending: Hematology

## 2024-10-28 ENCOUNTER — Inpatient Hospital Stay

## 2024-11-05 ENCOUNTER — Inpatient Hospital Stay: Attending: Hematology

## 2024-11-19 ENCOUNTER — Inpatient Hospital Stay

## 2024-11-26 ENCOUNTER — Inpatient Hospital Stay: Admitting: Oncology

## 2024-11-26 ENCOUNTER — Inpatient Hospital Stay

## 2024-12-03 ENCOUNTER — Inpatient Hospital Stay

## 2024-12-10 ENCOUNTER — Inpatient Hospital Stay: Attending: Hematology | Admitting: Oncology

## 2024-12-10 ENCOUNTER — Inpatient Hospital Stay
